# Patient Record
Sex: Male | Born: 1955 | Race: White | Hispanic: No | Marital: Single | Smoking: Former smoker
Health system: Southern US, Community
[De-identification: ages and names within clinical notes are randomized; demographics above are authoritative.]

## PROBLEM LIST (undated history)

## (undated) DIAGNOSIS — F22 Delusional disorders: Secondary | ICD-10-CM

## (undated) DIAGNOSIS — B1920 Unspecified viral hepatitis C without hepatic coma: Secondary | ICD-10-CM

## (undated) DIAGNOSIS — R440 Auditory hallucinations: Secondary | ICD-10-CM

## (undated) DIAGNOSIS — E7800 Pure hypercholesterolemia, unspecified: Secondary | ICD-10-CM

## (undated) DIAGNOSIS — L409 Psoriasis, unspecified: Secondary | ICD-10-CM

## (undated) DIAGNOSIS — I671 Cerebral aneurysm, nonruptured: Secondary | ICD-10-CM

## (undated) DIAGNOSIS — L400 Psoriasis vulgaris: Secondary | ICD-10-CM

## (undated) DIAGNOSIS — J449 Chronic obstructive pulmonary disease, unspecified: Secondary | ICD-10-CM

## (undated) DIAGNOSIS — J439 Emphysema, unspecified: Secondary | ICD-10-CM

## (undated) DIAGNOSIS — J45909 Unspecified asthma, uncomplicated: Secondary | ICD-10-CM

## (undated) DIAGNOSIS — F251 Schizoaffective disorder, depressive type: Secondary | ICD-10-CM

## (undated) DIAGNOSIS — F209 Schizophrenia, unspecified: Secondary | ICD-10-CM

## (undated) DIAGNOSIS — F411 Generalized anxiety disorder: Secondary | ICD-10-CM

## (undated) DIAGNOSIS — F419 Anxiety disorder, unspecified: Secondary | ICD-10-CM

## (undated) DIAGNOSIS — R296 Repeated falls: Secondary | ICD-10-CM

## (undated) DIAGNOSIS — I10 Essential (primary) hypertension: Secondary | ICD-10-CM

## (undated) DIAGNOSIS — F70 Mild intellectual disabilities: Secondary | ICD-10-CM

## (undated) DIAGNOSIS — F1910 Other psychoactive substance abuse, uncomplicated: Secondary | ICD-10-CM

## (undated) DIAGNOSIS — R1312 Dysphagia, oropharyngeal phase: Secondary | ICD-10-CM

## (undated) DIAGNOSIS — I700 Atherosclerosis of aorta: Secondary | ICD-10-CM

## (undated) DIAGNOSIS — K219 Gastro-esophageal reflux disease without esophagitis: Secondary | ICD-10-CM

## (undated) HISTORY — DX: Other psychoactive substance abuse, uncomplicated: F19.10

## (undated) HISTORY — DX: Psoriasis, unspecified: L40.9

## (undated) HISTORY — DX: Schizophrenia, unspecified: F20.9

## (undated) HISTORY — PX: CHEST TUBE INSERTION: SHX231

## (undated) HISTORY — DX: Emphysema, unspecified: J43.9

## (undated) HISTORY — DX: Pure hypercholesterolemia, unspecified: E78.00

## (undated) HISTORY — DX: Unspecified asthma, uncomplicated: J45.909

## (undated) HISTORY — PX: FOOT SURGERY: SHX648

## (undated) HISTORY — DX: Unspecified viral hepatitis C without hepatic coma: B19.20

## (undated) HISTORY — DX: Cerebral aneurysm, nonruptured: I67.1

---

## 2006-04-14 ENCOUNTER — Emergency Department: Payer: Self-pay | Admitting: Emergency Medicine

## 2006-04-15 ENCOUNTER — Other Ambulatory Visit: Payer: Self-pay

## 2007-11-28 HISTORY — PX: ESOPHAGOGASTRODUODENOSCOPY: SHX1529

## 2008-07-28 HISTORY — PX: COLONOSCOPY: SHX174

## 2009-04-27 HISTORY — PX: ESOPHAGOGASTRODUODENOSCOPY: SHX1529

## 2009-09-08 ENCOUNTER — Inpatient Hospital Stay: Payer: Self-pay | Admitting: Psychiatry

## 2009-09-23 ENCOUNTER — Ambulatory Visit: Payer: Self-pay | Admitting: Cardiovascular Disease

## 2009-12-28 ENCOUNTER — Emergency Department: Payer: Self-pay | Admitting: Emergency Medicine

## 2010-01-25 ENCOUNTER — Inpatient Hospital Stay: Payer: Self-pay | Admitting: Psychiatry

## 2010-03-31 ENCOUNTER — Emergency Department: Payer: Self-pay | Admitting: Emergency Medicine

## 2010-06-16 ENCOUNTER — Emergency Department: Payer: Self-pay | Admitting: Emergency Medicine

## 2010-09-09 ENCOUNTER — Emergency Department (HOSPITAL_COMMUNITY)
Admission: EM | Admit: 2010-09-09 | Discharge: 2010-09-09 | Payer: Self-pay | Source: Home / Self Care | Admitting: Emergency Medicine

## 2010-09-27 HISTORY — PX: COLONOSCOPY: SHX174

## 2010-10-24 ENCOUNTER — Emergency Department (HOSPITAL_COMMUNITY)
Admission: EM | Admit: 2010-10-24 | Discharge: 2010-10-25 | Payer: Self-pay | Source: Home / Self Care | Admitting: Emergency Medicine

## 2011-02-07 LAB — BASIC METABOLIC PANEL
CO2: 29 mEq/L (ref 19–32)
Calcium: 8.9 mg/dL (ref 8.4–10.5)
Chloride: 95 mEq/L — ABNORMAL LOW (ref 96–112)
Creatinine, Ser: 0.81 mg/dL (ref 0.4–1.5)
GFR calc Af Amer: >60 mL/min (ref >60)
Glucose, Bld: 83 mg/dL (ref 70–99)

## 2011-02-07 LAB — URINALYSIS, ROUTINE W REFLEX MICROSCOPIC
Bilirubin Urine: NEGATIVE
Glucose, UA: NEGATIVE mg/dL
Hgb urine dipstick: NEGATIVE
Ketones, ur: NEGATIVE mg/dL
Protein, ur: NEGATIVE mg/dL
Urobilinogen, UA: 0.2 mg/dL (ref 0.0–1.0)

## 2011-02-07 LAB — CBC
MCH: 34.8 pg — ABNORMAL HIGH (ref 26.0–34.0)
MCHC: 36.5 g/dL — ABNORMAL HIGH (ref 30.0–36.0)
MCV: 95.3 fL (ref 78.0–100.0)
Platelets: 112 10*3/uL — ABNORMAL LOW (ref 150–400)
RBC: 4.08 MIL/uL — ABNORMAL LOW (ref 4.22–5.81)

## 2011-02-07 LAB — POCT CARDIAC MARKERS
Myoglobin, poc: 43.7 ng/mL (ref 12–200)
Troponin i, poc: <0.05 ng/mL (ref 0.00–0.09)

## 2011-02-07 LAB — DIFFERENTIAL
Basophils Relative: 0 % (ref 0–1)
Eosinophils Absolute: 0.1 10*3/uL (ref 0.0–0.7)
Eosinophils Relative: 1 % (ref 0–5)
Lymphs Abs: 2.4 10*3/uL (ref 0.7–4.0)
Monocytes Relative: 11 % (ref 3–12)
Neutrophils Relative %: 52 % (ref 43–77)

## 2011-02-09 LAB — DIFFERENTIAL
Eosinophils Absolute: 0.2 10*3/uL (ref 0.0–0.7)
Lymphs Abs: 2.2 10*3/uL (ref 0.7–4.0)
Monocytes Absolute: 0.8 10*3/uL (ref 0.1–1.0)
Monocytes Relative: 12 % (ref 3–12)
Neutrophils Relative %: 50 % (ref 43–77)

## 2011-02-09 LAB — COMPREHENSIVE METABOLIC PANEL
ALT: 29 U/L (ref 0–53)
AST: 30 U/L (ref 0–37)
Albumin: 3.9 g/dL (ref 3.5–5.2)
Calcium: 9.3 mg/dL (ref 8.4–10.5)
GFR calc Af Amer: >60 mL/min (ref >60)
Glucose, Bld: 71 mg/dL (ref 70–99)
Sodium: 139 mEq/L (ref 135–145)
Total Protein: 7 g/dL (ref 6.0–8.3)

## 2011-02-09 LAB — URINALYSIS, ROUTINE W REFLEX MICROSCOPIC
Ketones, ur: NEGATIVE mg/dL
Nitrite: NEGATIVE
Protein, ur: NEGATIVE mg/dL
Urobilinogen, UA: 0.2 mg/dL (ref 0.0–1.0)

## 2011-02-09 LAB — CBC
MCHC: 35 g/dL (ref 30.0–36.0)
Platelets: 140 10*3/uL — ABNORMAL LOW (ref 150–400)
RDW: 14 % (ref 11.5–15.5)
WBC: 6.4 10*3/uL (ref 4.0–10.5)

## 2011-12-12 LAB — DRUG SCREEN, URINE
Amphetamines, Ur Screen: NEGATIVE (ref ?–1000)
Benzodiazepine, Ur Scrn: NEGATIVE (ref ?–200)
Cocaine Metabolite,Ur ~~LOC~~: NEGATIVE (ref ?–300)
MDMA (Ecstasy)Ur Screen: POSITIVE (ref ?–500)
Methadone, Ur Screen: NEGATIVE (ref ?–300)
Opiate, Ur Screen: NEGATIVE (ref ?–300)
Phencyclidine (PCP) Ur S: NEGATIVE (ref ?–25)

## 2011-12-12 LAB — CBC
HCT: 43.6 % (ref 40.0–52.0)
HGB: 14.9 g/dL (ref 13.0–18.0)
MCHC: 34.1 g/dL (ref 32.0–36.0)
RBC: 4.34 10*6/uL — ABNORMAL LOW (ref 4.40–5.90)
RDW: 13.6 % (ref 11.5–14.5)
WBC: 7.1 10*3/uL (ref 3.8–10.6)

## 2011-12-12 LAB — ETHANOL: Ethanol: <3 mg/dL

## 2011-12-12 LAB — COMPREHENSIVE METABOLIC PANEL
Alkaline Phosphatase: 70 U/L (ref 50–136)
Anion Gap: 8 (ref 7–16)
Calcium, Total: 8.9 mg/dL (ref 8.5–10.1)
Co2: 28 mmol/L (ref 21–32)
EGFR (Non-African Amer.): >60
Osmolality: 271 (ref 275–301)
Potassium: 3.7 mmol/L (ref 3.5–5.1)
SGPT (ALT): 33 U/L
Sodium: 136 mmol/L (ref 136–145)

## 2011-12-12 LAB — ACETAMINOPHEN LEVEL: Acetaminophen: <2 ug/mL

## 2011-12-12 LAB — SALICYLATE LEVEL: Salicylates, Serum: 8.1 mg/dL — ABNORMAL HIGH

## 2011-12-13 ENCOUNTER — Inpatient Hospital Stay: Payer: Self-pay | Admitting: Psychiatry

## 2011-12-13 LAB — TSH: Thyroid Stimulating Horm: 4.01 u[IU]/mL

## 2013-03-04 ENCOUNTER — Ambulatory Visit: Payer: Self-pay | Admitting: Gastroenterology

## 2013-03-12 ENCOUNTER — Encounter: Payer: Self-pay | Admitting: Gastroenterology

## 2013-03-12 ENCOUNTER — Ambulatory Visit (INDEPENDENT_AMBULATORY_CARE_PROVIDER_SITE_OTHER): Payer: Medicaid Other | Admitting: Gastroenterology

## 2013-03-12 VITALS — BP 108/72 | HR 84 | Temp 97.9°F | Ht 71.0 in | Wt 172.4 lb

## 2013-03-12 DIAGNOSIS — R768 Other specified abnormal immunological findings in serum: Secondary | ICD-10-CM | POA: Insufficient documentation

## 2013-03-12 DIAGNOSIS — Z1211 Encounter for screening for malignant neoplasm of colon: Secondary | ICD-10-CM | POA: Insufficient documentation

## 2013-03-12 DIAGNOSIS — R894 Abnormal immunological findings in specimens from other organs, systems and tissues: Secondary | ICD-10-CM

## 2013-03-12 LAB — CBC WITH DIFFERENTIAL/PLATELET
Basophils Absolute: 0 10*3/uL (ref 0.0–0.1)
Eosinophils Relative: 1 % (ref 0–5)
HCT: 42.1 % (ref 39.0–52.0)
Lymphocytes Relative: 16 % (ref 12–46)
Lymphs Abs: 1.4 10*3/uL (ref 0.7–4.0)
MCH: 32.7 pg (ref 26.0–34.0)
MCV: 93.8 fL (ref 78.0–100.0)
Monocytes Absolute: 1 10*3/uL (ref 0.1–1.0)
RDW: 14.4 % (ref 11.5–15.5)
WBC: 8.8 10*3/uL (ref 4.0–10.5)

## 2013-03-12 NOTE — Progress Notes (Addendum)
Referring Provider: Margorie John, MD Primary Care Physician:  Ian John, MD Primary Gastroenterologist:  Dr. Darrick Penna   Chief Complaint  Patient presents with  . Hepatitis  . Colonoscopy  . EGD    HPI:   Ian Medina is a 57 year old male presenting today at the request of Ian Medina, [place], due to Hepatitis C and need for screening colonoscopy. Resident of [facility]. He doesn't believe he was ever treated for this. Last colonoscopy in remote past, unclear history. Possibly by Dr. Allena Katz in [place], [place].  Denies abdominal pain. Caregiver states he sometimes doesn't want to eat. They tell him he can't smoke if he doesn't eat, then he will eat. Loves spaghetti. Will ask for seconds. No N/V. No GERD or dysphagia. No rectal bleeding, no melena. No constipation. Had a few loose stools about a week ago but resolved.    Unclear prior work-up for Hep C. +IV drug abuse many years ago. Denies any prior blood transfusions or multiple sexual partners.   Past Medical History  Diagnosis Date  . Hepatitis C   . Asthma   . Hypercholesterolemia   . Schizophrenia     diagnosed at age 87, treated by ACT team    Past Surgical History  Procedure Laterality Date  . Foot surgery      right  . Chest tube insertion    . Colonoscopy  Nov 2011    Dr. Samuella Cota: normal, internal hemorrhoids  . Esophagogastroduodenoscopy  June 2010    Dr. Samuella Cota: normal  . Colonoscopy  Sept 2009    small internal hemorrhoids, normal   . Esophagogastroduodenoscopy  Jan 2009    Dr. Allena Katz: linear esophagitis, mild stricture, diffuse gastritis and mild duodenitis, PATH: chronic gastritis, negative H.pylori     Current Outpatient Prescriptions  Medication Sig Dispense Refill  . benztropine (COGENTIN) 2 MG tablet Take 2 mg by mouth 2 (two) times daily.      . budesonide (PULMICORT) 180 MCG/ACT inhaler Inhale 2 puffs into the lungs 2 (two) times daily.      . clonazePAM (KLONOPIN) 0.5 MG tablet Take 0.5  mg by mouth 3 (three) times daily as needed for anxiety.      Marland Kitchen FLUoxetine (PROZAC) 20 MG tablet Take 40 mg by mouth daily.      . haloperidol (HALDOL) 10 MG tablet Take 10 mg by mouth 2 (two) times daily. 1 in the am 2 tablets at pm      . montelukast (SINGULAIR) 10 MG tablet Take 10 mg by mouth at bedtime.      . niacin (NIASPAN) 1000 MG CR tablet Take 1,000 mg by mouth at bedtime.      Marland Kitchen OLANZapine (ZYPREXA) 10 MG tablet Take 30 mg by mouth at bedtime.      . pantoprazole (PROTONIX) 40 MG tablet Take 40 mg by mouth daily.      . sucralfate (CARAFATE) 1 G tablet Take 1 g by mouth 4 (four) times daily.      . traZODone (DESYREL) 50 MG tablet Take 50 mg by mouth at bedtime.       No current facility-administered medications for this visit.    Allergies as of 03/12/2013  . (No Known Allergies)    Family History  Problem Relation Age of Onset  . Colon cancer Neg Hx     History   Social History  . Marital Status: Single    Spouse Name: N/A    Number of Children: N/A  .  Years of Education: N/A   Occupational History  . Not on file.   Social History Main Topics  . Smoking status: Current Every Day Smoker -- 1.00 packs/day    Types: Cigarettes  . Smokeless tobacco: Not on file  . Alcohol Use: No  . Drug Use: No     Comment: IV drug use in remote past  . Sexually Active: Not on file   Other Topics Concern  . Not on file   Social History Narrative  . No narrative on file    Review of Systems: Gen: SEE HPI CV: Denies chest pain, heart palpitations, syncope, peripheral edema. Resp: +cough in am GI: SEE HPI GU : Denies urinary burning, urinary frequency, urinary incontinence.  MS: Denies joint pain, muscle weakness, cramps, limited movement Derm: Denies rash, itching, dry skin Psych: +depression/anxiety Heme: Denies bruising, bleeding, and enlarged lymph nodes.  Physical Exam: BP 108/72  Pulse 84  Temp(Src) 97.9 F (36.6 C) (Oral)  Ht 5\' 11"  (1.803 m)  Wt 172  lb 6.4 oz (78.2 kg)  BMI 24.06 kg/m2 General:   Alert and oriented. Appears older than stated age.  Head:  Normocephalic and atraumatic. Eyes:  Conjunctiva pink, sclera clear, no icterus.    Ears:  Normal auditory acuity. Nose:  No deformity, discharge,  or lesions. Mouth:  No deformity or lesions, edentulous  Neck:  Supple, without mass or thyromegaly. Lungs:  Clear to auscultation bilaterally, without wheezing, rales, or rhonchi.  Heart:  S1, S2 present without murmurs noted.  Abdomen:  +BS, soft, non-tender and non-distended. Without mass or HSM. No rebound or guarding. No hernias noted. Rectal:  Deferred  Msk:  Symmetrical without gross deformities. Normal posture. Extremities:  Without clubbing or edema. Neurologic:  Alert and  oriented x4;  grossly normal neurologically. Skin:  Intact, warm and dry without significant lesions or rashes Cervical Nodes:  No significant cervical adenopathy. Psych:  Alert and cooperative. Normal mood and affect.

## 2013-03-12 NOTE — Progress Notes (Signed)
Cc PCP 

## 2013-03-12 NOTE — Assessment & Plan Note (Signed)
57 year old male with reported history of Hep C, unclear if treatment undertaken in past. Risk factors include remote history of IV drug abuse. No GI complaints, and I do not have any recent blood work that has been drawn. Will check RNA status, genotype if positive RNA, Hep A and B immunity, CBC with diff. May need [place] of abdomen. Attempt to obtain CMP and HFP done at PCP's office. Vaccinations if appropriate after review of blood work. Refer to Hep C clinic if + RNA.

## 2013-03-12 NOTE — Assessment & Plan Note (Signed)
Unclear last colonoscopy. Will attempt to request records. Denies any rectal bleeding, bowel changes. PCP notes state Dr. Allena Katz may have seen him in the past. Discussed risks and benefits with patient regarding a possible colonoscopy. He is willing to proceed if necessary. WILL NEED PROPOFOL DUE TO POLYPHARMACY.

## 2013-03-12 NOTE — Patient Instructions (Addendum)
Please have blood work done today. We will call you with further tests needed.  I need to get the last colonoscopy reports before we decide if you need another one.  We will be in touch shortly regarding this.

## 2013-03-13 LAB — HEPATITIS B CORE ANTIBODY, TOTAL: Hep B Core Total Ab: NEGATIVE

## 2013-03-13 LAB — HEPATITIS A ANTIBODY, TOTAL: Hep A Total Ab: POSITIVE — AB

## 2013-03-19 ENCOUNTER — Other Ambulatory Visit: Payer: Self-pay

## 2013-03-19 ENCOUNTER — Other Ambulatory Visit: Payer: Self-pay | Admitting: Gastroenterology

## 2013-03-19 ENCOUNTER — Encounter: Payer: Self-pay | Admitting: Gastroenterology

## 2013-03-19 DIAGNOSIS — B1920 Unspecified viral hepatitis C without hepatic coma: Secondary | ICD-10-CM

## 2013-03-19 NOTE — Progress Notes (Signed)
I received op notes from prior GI evaluation. Last colonoscopy in 2011 by Dr. Samuella Cota, which was normal. Apparently, patient has a history of polyps, but I do not have actual documentation of this. Will need TCS in 2016 for surveillance.  Last EGD in June 2010 by Dr. Samuella Cota: normal.   No need for colonoscopy at this point. Need CMP and HFP. We have requested this from PCP, but the only HFP I got was from 2009.

## 2013-03-19 NOTE — Progress Notes (Signed)
Abdominal U/S scheduled for Tuesday April 29th at 9:00 am and his caregiver is aware

## 2013-03-19 NOTE — Progress Notes (Signed)
Quick Note:  Routing to [place], this is an [facility] pt. ______

## 2013-03-19 NOTE — Addendum Note (Signed)
Addended by: Lavena Bullion on: 03/19/2013 01:38 PM   Modules accepted: Orders

## 2013-03-19 NOTE — Progress Notes (Signed)
Mailed the info and labs. Routing to Soledad Gerlach to schedule the [place] of abdomen.

## 2013-03-19 NOTE — Progress Notes (Signed)
Called and spoke to [facility]. Caregiver at the facility. ( she said the administrator is Toney Reil, who is unavailable. ) She said she usually takes messages for the pt's.   I went over all of the info. She said ok to mail the lab order so they can do with PCP.   I told her I will mail a copy of all of the info, so she will have for his records.   OK to call and schedule the [place] of abdomen.

## 2013-03-19 NOTE — Progress Notes (Signed)
Quick Note:  I received op notes from prior GI evaluation. Last colonoscopy in 2011 by Dr. Samuella Cota, which was normal. Apparently, patient has a history of polyps, but I do not have actual documentation of this. Will need TCS in 2016 for surveillance.  Last EGD in June 2010 by Dr. Samuella Cota: normal.   No need for colonoscopy at this point. Still need HFP. If we can just add that on to the blood in lab, that would be great.  IF NOT, needs HFP and Hep C genotype. + Hep C.  Needs Hep B vaccination, immune to Hep A.  Needs [place] of abdomen.  Referral to Hep C clinic after HFP and genotype obtained. ______

## 2013-03-25 ENCOUNTER — Ambulatory Visit (HOSPITAL_COMMUNITY): Payer: Medicaid Other | Attending: Gastroenterology

## 2013-04-14 ENCOUNTER — Ambulatory Visit (HOSPITAL_COMMUNITY)
Admission: RE | Admit: 2013-04-14 | Discharge: 2013-04-14 | Disposition: A | Discharge status: Home / Self Care | Payer: Medicare Other | Source: Ambulatory Visit | Attending: Gastroenterology | Admitting: Gastroenterology

## 2013-04-14 DIAGNOSIS — B1920 Unspecified viral hepatitis C without hepatic coma: Secondary | ICD-10-CM | POA: Insufficient documentation

## 2013-04-14 LAB — HEPATIC FUNCTION PANEL
Bilirubin, Direct: 0.1 mg/dL (ref 0.0–0.3)
Indirect Bilirubin: 0.4 mg/dL (ref 0.0–0.9)

## 2013-04-23 NOTE — Progress Notes (Signed)
Quick Note:  K[place] of abdomen reviewed: liver looks good Slight "fullness" of right and left renal pelvis. This is unclear significance. Please have this [place] of abdomen sent to PCP along with this addendum for further evaluation as appropriate. ______

## 2013-04-23 NOTE — Progress Notes (Signed)
Quick Note:  LFTs look great.  He is genotype 1b.  [place]orea of abdomen is normal without evidence of cirrhosis. However, there is some mention of "fullness" of renal area; I do not have recent BMP on file. I will make a note under Korea, and have this sent to his PCP for further evaluation.    Needs: Referral to Hep C clinic for treatment, Genotype 1b. Needs Hep B vaccination only. Return to see [place] in 6 months.  No colonoscopy until 2016 ______

## 2013-04-24 NOTE — Progress Notes (Signed)
Quick Note:  Caregiver aware. See note under labs. ______

## 2013-04-24 NOTE — Progress Notes (Signed)
Cc PCP 

## 2013-04-24 NOTE — Progress Notes (Signed)
Quick Note:  Called and informed [employer], caregiver. Mailing her the list of info. She said he has already begun his Hep B series. She is aware that PCP will get the [place] to follow up on. Routing to Soledad Gerlach and Darl Pikes to send to PCP and do next appt and nic colonoscopy for 2016. ______

## 2013-06-11 NOTE — Progress Notes (Signed)
REVIEWED.  OPV IN ONE YEAR

## 2014-05-01 ENCOUNTER — Other Ambulatory Visit (HOSPITAL_COMMUNITY): Payer: Self-pay | Admitting: *Deleted

## 2014-05-01 DIAGNOSIS — R1310 Dysphagia, unspecified: Secondary | ICD-10-CM

## 2014-05-11 ENCOUNTER — Ambulatory Visit (HOSPITAL_COMMUNITY)
Admission: RE | Admit: 2014-05-11 | Discharge: 2014-05-11 | Disposition: A | Discharge status: Home / Self Care | Payer: Medicare Other | Source: Ambulatory Visit | Attending: Family Medicine | Admitting: Family Medicine

## 2014-05-11 ENCOUNTER — Ambulatory Visit (HOSPITAL_COMMUNITY)
Admission: RE | Admit: 2014-05-11 | Discharge: 2014-05-11 | Disposition: A | Discharge status: Home / Self Care | Payer: Medicare Other | Source: Ambulatory Visit | Attending: Family | Admitting: Family

## 2014-05-11 DIAGNOSIS — E7800 Pure hypercholesterolemia, unspecified: Secondary | ICD-10-CM | POA: Diagnosis not present

## 2014-05-11 DIAGNOSIS — Z8619 Personal history of other infectious and parasitic diseases: Secondary | ICD-10-CM | POA: Diagnosis not present

## 2014-05-11 DIAGNOSIS — F209 Schizophrenia, unspecified: Secondary | ICD-10-CM | POA: Insufficient documentation

## 2014-05-11 DIAGNOSIS — J45909 Unspecified asthma, uncomplicated: Secondary | ICD-10-CM | POA: Diagnosis not present

## 2014-05-11 DIAGNOSIS — R1310 Dysphagia, unspecified: Secondary | ICD-10-CM | POA: Diagnosis present

## 2014-05-11 DIAGNOSIS — IMO0001 Reserved for inherently not codable concepts without codable children: Secondary | ICD-10-CM | POA: Diagnosis not present

## 2014-05-19 NOTE — Procedures (Addendum)
Objective Swallowing Evaluation: Modified Barium Swallowing Study  Patient Details  Name: Ian Medina MRN: [id number] Date of Birth: 02/26/1956  Today's Date: 05/11/2014 Time: 11:35 AM  - 12:20 PM  45 minutes  Past Medical History:  Past Medical History  Diagnosis Date  . Hepatitis C   . Asthma   . Hypercholesterolemia   . Schizophrenia     diagnosed at age 58, treated by ACT team   Past Surgical History:  Past Surgical History  Procedure Laterality Date  . Foot surgery      right  . Chest tube insertion    . Colonoscopy  Nov 2011    Dr. Samuella CotaPandya: normal, internal hemorrhoids  . Esophagogastroduodenoscopy  June 2010    Dr. Samuella CotaPandya: normal  . Colonoscopy  Sept 2009    small internal hemorrhoids, normal   . Esophagogastroduodenoscopy  Jan 2009    Dr. Allena KatzPatel: linear esophagitis, mild stricture, diffuse gastritis and mild duodenitis, PATH: chronic gastritis, negative H.pylori    HPI:  Ian Medina is a 58 year old male resident [facility] who was referred for MBSS following a discharge from [facility] on puree and pudding thick liquids. He is being followed by [facility] for dysphagia intervention with Celedonio MiyamotoJudy Jeffrey. He is currently on a puree diet with honey-thick liquids.      Recommendation/Prognosis  Clinical Impression:   Dysphagia Diagnosis: Moderate oral phase dysphagia;Moderate pharyngeal phase dysphagia;Mild cervical esophageal phase dysphagia Clinical impression: Pt presents with mi/mod oropharyngeal phase dysphagia and mild cervical esophageal phase. Oral phase is characterized by reduced lingual movement, with reduced anterior posterior transit/bolus propulsion and cohesion of bolus resulting in piecemeal deglutition and lingual residue. Bolus spills over base of tongue prematurely without lingual movement. Pharyngeal phase is characterized by delay in swallow initiation (triggered after filling the valleculae and spilling to  pyriforms with thins), reduces laryngeal elevation, reduced tongue base retraction, reduced epiglottic deflection and airway closure resulting in penetration and aspiration of thins before and during the swallow which was not always cleared with spontaneous cough. Pt had mild to moderate pharyngeal residue which he occasionally cleared with spontaneous secondary swallow. He responded well to implement effortful swallow and repeat swallow to clear. Reduced UES relaxation noted with incomplete clearance of tail of bolus. Recommend D3/mech soft and nectar-thick liquids with implementation of effortful swallow, small bites/sips, and swallow 2x for each bite/sip. Results of MBSS discussed with pt and caregiver (Tiffany). Elmarie Shileyiffany is worried that pt will not be compliant with nectar-thick liquids because he will want to have what the other residents have. She reports that his lungs have been clear and that he's been sneaking thin liquids. She understands that I cannot tell her what is a safe amount to aspirate. I encouraged Tiffany to speak with Ian Medina and his doctor to decide whether they wish to pursue thin liquids with known risk for hydration. Strongly advised them to use nectar-thick liquids for 4-6 weeks and repeat MBSS at that time.    Swallow Evaluation Recommendations:  Diet Recommendations: Dysphagia 3 (Mechanical Soft);Nectar-thick liquid Liquid Administration via: Cup Medication Administration: Crushed with puree Supervision: Patient able to self feed;Intermittent supervision to cue for compensatory strategies Compensations: Slow rate;Small sips/bites;Multiple dry swallows after each bite/sip;Effortful swallow Postural Changes and/or Swallow Maneuvers: Out of bed for meals;Seated upright 90 degrees;Upright 30-60 min after meal Oral Care Recommendations: Oral care BID Other Recommendations: Clarify dietary restrictions Follow up Recommendations: Home health SLP    Prognosis:  Fair    Individuals Consulted: Consulted and Agree with Results and Recommendations: Patient;Family member/caregiver Report Sent to : Primary SLP;Referring physician;Facility (Comment)    General: Date of Onset: 04/30/14 Type of Study: Modified Barium Swallowing Study Reason for Referral: Objectively evaluate swallowing function Previous Swallow Assessment: None on record Diet Prior to this Study: Dysphagia 1 (puree);Honey-thick liquids Temperature Spikes Noted: No Respiratory Status: Room air History of Recent Intubation: No Behavior/Cognition: Alert;Cooperative;Pleasant mood Oral Cavity - Dentition: Edentulous Oral Motor / Sensory Function: Within functional limits Self-Feeding Abilities: Able to feed self Patient Positioning: Upright in chair Baseline Vocal Quality: Clear Volitional Cough: Weak Volitional Swallow: Able to elicit Anatomy: Within functional limits Pharyngeal Secretions: Not observed secondary MBS   Reason for Referral:   Objectively evaluate swallowing function    Oral Phase: Oral Preparation/Oral Phase Oral Phase: Impaired Oral - Nectar Oral - Nectar Cup: Weak lingual manipulation;Lingual pumping;Incomplete tongue to palate contact;Reduced posterior propulsion;Lingual/palatal residue;Piecemeal swallowing Oral - Thin Oral - Thin Cup: Weak lingual manipulation;Lingual pumping;Incomplete tongue to palate contact;Reduced posterior propulsion;Lingual/palatal residue;Piecemeal swallowing;Left anterior bolus loss Oral - Solids Oral - Puree: Weak lingual manipulation;Lingual pumping;Incomplete tongue to palate contact;Reduced posterior propulsion;Piecemeal swallowing;Lingual/palatal residue Oral - Mechanical Soft: Weak lingual manipulation;Delayed oral transit;Lingual pumping;Incomplete tongue to palate contact;Reduced posterior propulsion;Piecemeal swallowing;Lingual/palatal residue   Pharyngeal Phase:  Pharyngeal Phase Pharyngeal Phase: Impaired Pharyngeal -  Nectar Pharyngeal - Nectar Cup: Delayed swallow initiation;Premature spillage to pyriform sinuses;Reduced epiglottic inversion;Reduced tongue base retraction;Pharyngeal residue - valleculae;Reduced laryngeal elevation;Lateral channel residue;Pharyngeal residue - cp segment;Reduced pharyngeal peristalsis Pharyngeal - Thin Pharyngeal - Thin Cup: Premature spillage to pyriform sinuses;Reduced laryngeal elevation;Pharyngeal residue - cp segment;Pharyngeal residue - valleculae;Lateral channel residue;Delayed swallow initiation;Reduced epiglottic inversion;Reduced tongue base retraction;Reduced airway/laryngeal closure;Reduced pharyngeal peristalsis;Penetration/Aspiration before swallow;Trace aspiration;Penetration/Aspiration during swallow;Inter-arytenoid space residue Penetration/Aspiration details (thin cup): Material enters airway, remains ABOVE vocal cords then ejected out;Material enters airway, passes BELOW cords without attempt by patient to eject out (silent aspiration);Material enters airway, passes BELOW cords and not ejected out despite cough attempt by patient;Material enters airway, CONTACTS cords then ejected out Pharyngeal - Solids Pharyngeal - Puree: Delayed swallow initiation;Premature spillage to valleculae;Reduced pharyngeal peristalsis;Reduced epiglottic inversion;Reduced laryngeal elevation;Reduced tongue base retraction;Pharyngeal residue - valleculae;Pharyngeal residue - cp segment Pharyngeal - Mechanical Soft: Delayed swallow initiation;Reduced epiglottic inversion;Reduced tongue base retraction;Premature spillage to valleculae;Reduced pharyngeal peristalsis;Reduced laryngeal elevation;Pharyngeal residue - cp segment;Pharyngeal residue - valleculae   Cervical Esophageal Phase  Cervical Esophageal Phase Cervical Esophageal Phase: Impaired Cervical Esophageal Phase - Solids Puree: Reduced cricopharyngeal relaxation Cervical Esophageal Phase - Comment Cervical Esophageal Comment:  incomplete clearance of bolus through UES with some backflow into pyriforms        G-Codes: Swallowing/ MBSS: Current: CK Goal: CI Discharge: CK   Thank you,  Havery MorosDabney Porter, CCC-SLP [phone]   PORTER,DABNEY 05/11/2014, 2:41 PM

## 2014-05-20 ENCOUNTER — Other Ambulatory Visit (HOSPITAL_COMMUNITY): Payer: Self-pay | Admitting: Psychiatry

## 2014-05-20 DIAGNOSIS — R4182 Altered mental status, unspecified: Secondary | ICD-10-CM

## 2014-05-25 ENCOUNTER — Ambulatory Visit (HOSPITAL_COMMUNITY): Payer: PRIVATE HEALTH INSURANCE

## 2014-06-03 ENCOUNTER — Ambulatory Visit (HOSPITAL_COMMUNITY)
Admission: RE | Admit: 2014-06-03 | Discharge: 2014-06-03 | Disposition: A | Discharge status: Home / Self Care | Payer: PRIVATE HEALTH INSURANCE | Source: Ambulatory Visit | Attending: Psychiatry | Admitting: Psychiatry

## 2014-06-03 DIAGNOSIS — I671 Cerebral aneurysm, nonruptured: Secondary | ICD-10-CM | POA: Diagnosis not present

## 2014-06-03 DIAGNOSIS — R4182 Altered mental status, unspecified: Secondary | ICD-10-CM | POA: Insufficient documentation

## 2014-06-03 DIAGNOSIS — I6789 Other cerebrovascular disease: Secondary | ICD-10-CM | POA: Insufficient documentation

## 2014-06-03 DIAGNOSIS — J329 Chronic sinusitis, unspecified: Secondary | ICD-10-CM | POA: Insufficient documentation

## 2014-06-08 DIAGNOSIS — R4189 Other symptoms and signs involving cognitive functions and awareness: Secondary | ICD-10-CM | POA: Diagnosis present

## 2014-07-28 NOTE — Addendum Note (Signed)
Encounter addended by: Dorene Ar, CCC-SLP on: 07/28/2014  4:33 PM<BR>     Documentation filed: Clinical Notes

## 2015-03-21 NOTE — Discharge Summary (Signed)
PATIENT NAME:  Ian Medina, Ian Medina MR#:  [id number] DATE OF BIRTH:  13-Oct-1956  DATE OF ADMISSION:  12/13/2011 DATE OF DISCHARGE:  12/20/2011  HISTORY OF PRESENT ILLNESS: Mr. Ian Medina is a 59 year old male suffering from schizoaffective disorder. There had been an attempt to reduce his medication in late November. He then exacerbated with periods of taking all his clothes off. When he presented to the Emergency Room, he was experiencing severe high-volume and high-frequency auditory hallucinations telling him to kill himself, specifically cut his wrists. He also was experiencing catastrophic anxiety.   He had a delusion that the Nursing Home was restricting his medication deliberately. He also reported that he had been cheated out of an $8000 inheritance. He was not experiencing any orientation or memory difficulty. He was exhibiting his baseline of intellectual challenge.   ANCILLARY CLINICAL DATA: None.   HOSPITAL COURSE: Mr. Ian Medina was admitted to the Inpatient Behavioral Medicine Unit and underwent milieu and group psychotherapy. He was titrated back to the effective dosages of his psychotropic medications gradually.  This involved a titration of his Haldol back to 30 mg daily, paroxetine 40 mg daily, Olanzapine 20 mg at bedtime. He also received trazodone 50 mg at bedtime.   He achieved great improvement in reduction of hallucinations by the 22nd of January. His greatest concern at that time was acute anxiety. He had been on Klonopin in the past, and Klonopin was re-added at 0.5 mg b.i.d.   CONDITION ON DISCHARGE: By the 23rd of January 2013, Mr. Ian Medina was reporting calm disposition with the Klonopin. He was reporting no auditory hallucinations. He was socially interactive and expressing normal interests and hope. His memory and orientation function were intact. He was not exhibiting any adverse medication effects.   MENTAL STATUS EXAM UPON DISCHARGE: Mr. Ian Medina is alert. His eye contact is good.  His concentration is normal. His memory is intact to immediate, recent, and remote. His fund of knowledge and intelligence still reflect a level below average. His speech involves a slightly flat prosody but normal rate and no dysarthria. Thought process is logical, coherent, and goal directed. No looseness of associations or tangents. Thought content: No thoughts of harming himself or others. No delusions or hallucinations. Insight is intact. Judgment is intact. Affect is mildly flat at baseline but with a broad and appropriate range as the interview progresses. Mood is normal.   ASSESSMENT:  AXIS I:  1. Schizophrenia, undifferentiated type, chronic with acute exacerbation, now stable.  2. Anxiety disorder, not otherwise specified, stable.   The MDMA was assessed as a lab error.  AXIS II: Deferred.   AXIS III: History of hepatitis C and gout. Please see the past medical history and previous dictation.   AXIS IV: Primary support group, possible economic.   AXIS V: 57.   Mr. Ian Medina is no longer at risk to harm himself or others. He agrees to call Emergency Services immediately for any thoughts of harming himself, thoughts of harming others, or distress.   DIET: Regular.   ACTIVITY: Routine.   DISCHARGE MEDICATIONS:  1. Benztropine 0.5 mg b.i.d.  2. Trazodone 50 mg daily.  3. Zyprexa 20 mg daily.  4. Paroxetine 40 mg daily (for antianxiety long-term).  5. Haldol 30 mg in a.m.  6. Clonazepam 0.5 mg t.i.d. for anti-acute anxiety.     FOLLOWUP: ACTT on 12/26/2011 at 4:00 p.m. at group home.    ____________________________ Adelene AmasJames S. Ayanah Snader, MD [id number] D: 12/21/2011 22:11:26 ET T: 12/22/2011 12:06:24 ET JOB#: [id number]  cc: Adelene Amas. Johannah Rozas, MD, <Dictator> Lester Ranger MD ELECTRONICALLY SIGNED 12/22/2011 19:29

## 2015-03-21 NOTE — H&P (Signed)
PATIENT NAME:  Ian Medina, Ian Medina MR#:  [id number] DATE OF BIRTH:  [birth year]  DATE OF ADMISSION:  12/13/2011  REASON FOR ADMISSION: Psychosis.   HISTORY OF PRESENT ILLNESS: Mr. Ian Medina resides at a group home. He states that he has been stressed by losing his portion of an inheritance amounting to $8000. He received this news approximately five days ago. He has experienced insomnia, increased anxiety, as well as a return of very loud and frequent auditory hallucinations. They are command self-destructive. They tell him to cut himself with a knife on his wrist; however, Mr. Ian Medina does have enough insight to know that his psychiatric illness has worsened and he needs to get help. Mr. Ian Medina has intact orientation function as well as memory function. He is not experiencing any adverse medication effects. He has been maintained on Zyprexa 20 mg daily, along with Haldol 30 mg daily. Please see below for the rest of his psychotropic medications.   PAST PSYCHIATRIC HISTORY: Mr. Ian Medina has undergone multiple [facility], [facility] [address] [facility] [facility]. He has a history of multiple psychotic exacerbations and a documented diagnosis of schizophrenia. He has never tried to kill himself. He has experienced excessive worry, feeling on edge, as well as muscle tension. He also has experienced periods of severe depressed mood and difficulty concentrating. He has been placed on Paxil currently at 20 mg b.i.d. and trazodone 50 mg at bedtime. He also requires Klonopin 0.5 mg daily. For anti-EPS, he takes Cogentin 1 mg at bedtime.   FAMILY PSYCHIATRIC HISTORY: Father with schizophrenia, sister with bipolar disorder.   SOCIAL HISTORY: Mr. Ian Medina lives in a group home. He does not use alcohol or illegal drugs. Please see the above. It appears that someone became deceased in the family, left an inheritance, and the patient missed out on it. However, this has not  yet been confirmed.   OCCUPATION: Disabled, receiving SSI. He is followed by an ACT.   PAST MEDICAL HISTORY:  1. Asthma.  2. Gout.  3. Hypertension.  4. Hepatitis C.   ALLERGIES: Darvocet.   MEDICATIONS:  1. Cogentin 1 mg at bedtime.  2. Zyprexa 20 mg in p.m.  3. Klonopin 0.5 mg b.i.d.  4. Haldol 30 mg daily.  5. Trazodone 50 mg at bedtime.  6. Paroxetine 40 mg daily.   LABORATORY, DIAGNOSTIC AND RADIOLOGICAL DATA:  Repeat TSH was normal at 4.01. The original was 5.61 yesterday.  Aspirin unremarkable.  CBC unremarkable.  Ethanol negative.  Complete metabolic panel unremarkable.  Tylenol negative.  Urine drug screen is positive for MDMA. This was verified by repeat testing as reported by the Lab. The patient has denied use of substances of abuse.   REVIEW OF SYSTEMS: Constitutional, head, eyes, ears, nose, throat, mouth, neurologic, psychiatric, cardiovascular, respiratory, gastrointestinal, genitourinary, skin, musculoskeletal, hematologic, lymphatic, endocrine, metabolic all are unremarkable.   PHYSICAL EXAMINATION:  VITAL SIGNS: Temperature 98.3, pulse 88, respiratory rate 18, blood pressure 119/83.   GENERAL APPEARANCE: Mr. Ian Medina is a well-developed, well-nourished, middle-aged male sitting in his hospital bed with no abnormal involuntary movements. He has no cachexia. His muscle tone is normal. Grooming is mildly disheveled, hygiene normal.   HEENT: Hearing intact bilaterally to finger rub. Pupils are equally round and reactive to light and accommodation. Oropharynx is clear with no erythema.   NECK: Supple, nontender. No masses.   RESPIRATORY: Normal breath sounds. No wheezing, rhonchi or crackles.  CARDIOVASCULAR: Regular rate and rhythm. No murmurs, rubs or gallops.  ABDOMEN: Bowel sounds are positive, soft, nontender.   GENITOURINARY: Deferred.   LYMPHATICS: No palpable nodes.   EXTREMITIES: No cyanosis, clubbing or edema.   SKIN: Normal turgor. No  rashes.   NEUROLOGICAL: Cranial nerves II through XII are intact. Sensory intact throughout to light touch. Motor strength 5 out of 5 throughout. Deep tendon reflexes normal strength and symmetry throughout. No Babinski. Coordination intact by normal gait.   MENTAL STATUS EXAMINATION: Ian Medina is alert. His eye contact is good. He is oriented to all spheres. His concentration is mildly decreased. His intelligence is below average, and there is a history documented in the previous medical record of him being intellectually challenged. His fund of knowledge is below average. His use of language demonstrates lower than average ability. His speech does involve normal rate and prosody without dysarthria. Thought process is logical, coherent, and goal-directed. There are no looseness of associations. There is occasional thought blocking. There are no tangents. Thought content: Please see the discussion above regarding his hallucinations. He has no delusions. He contracts for safety in the hospital. He has no thoughts of harming others. Affect is anxious, mood is anxious. Insight is intact regarding his psychiatric condition. Judgment is intact for the need of psychiatric treatment.   ASSESSMENT:  AXIS I:  1. Schizophrenia, undifferentiated type, chronic with acute exacerbation.  2. 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 If this is indicative of the patient abusing MDMA, it could have been involved in precipitating his acute psychosis.   AXIS II: Deferred.   AXIS III: History of hepatitis C, gout. Please see the past medical history.   AXIS IV: Primary support group, possibly economic.  AXIS V: 25.   SUICIDE RISK ASSESSMENT: Ian Medina is at risk for suicide outside of the supportive environment of the hospital.   PLAN:  1. Therefore, the undersigned will admit Ian Medina to the Inpatient Behavioral Unit where he will undergo milieu and group psychotherapy. The patient  reported that he has been off of his psychotropic medication for five days. He states that his psychosis exacerbates within two days when coming off of that. Therefore, there are two possible biologic exacerbating factors: Noncompliance and MDMA.  2. I will provide further opportunity for Mr. Burd to discuss any possible drug abuse.  3. I will proceed with his Zyprexa, augmented with Haldol for anti-psychosis. I will restart the Zyprexa at 10 mg at bedtime and anticipate titrating to 20 mg at bedtime. I will restart his Haldol at 10 mg daily and anticipate titrating to his preadmission level.  4. For antianxiety and antidepression, I will restart his paroxetine at 20 mg daily. For anti-insomnia and augmenting paroxetine, I will restart his trazodone at 50 mg at bedtime. I will provide Cogentin 0.5 mg b.i.d. for anti-EPS.   ____________________________ Adelene Amas. Kema Santaella, MD jsw:[id number] D: 12/13/2011 18:41:45 ET T: 12/14/2011 07:12:31 ET JOB#: [id number]  cc: Adelene Amas. Reece Fehnel, MD, <Dictator> [place] MD ELECTRONICALLY SIGNED 12/22/2011 19:27

## 2015-03-25 ENCOUNTER — Encounter: Payer: Self-pay | Admitting: Gastroenterology

## 2015-11-28 DIAGNOSIS — J189 Pneumonia, unspecified organism: Secondary | ICD-10-CM

## 2015-11-28 HISTORY — DX: Pneumonia, unspecified organism: J18.9

## 2016-07-26 ENCOUNTER — Encounter (INDEPENDENT_AMBULATORY_CARE_PROVIDER_SITE_OTHER): Payer: Self-pay | Admitting: Internal Medicine

## 2016-08-09 ENCOUNTER — Ambulatory Visit (INDEPENDENT_AMBULATORY_CARE_PROVIDER_SITE_OTHER): Payer: Medicaid Other | Admitting: Internal Medicine

## 2016-08-23 ENCOUNTER — Ambulatory Visit (INDEPENDENT_AMBULATORY_CARE_PROVIDER_SITE_OTHER): Payer: Medicare Other | Admitting: Internal Medicine

## 2016-08-23 ENCOUNTER — Encounter (INDEPENDENT_AMBULATORY_CARE_PROVIDER_SITE_OTHER): Payer: Self-pay

## 2016-08-23 ENCOUNTER — Encounter (INDEPENDENT_AMBULATORY_CARE_PROVIDER_SITE_OTHER): Payer: Self-pay | Admitting: *Deleted

## 2016-08-23 ENCOUNTER — Encounter (INDEPENDENT_AMBULATORY_CARE_PROVIDER_SITE_OTHER): Payer: Self-pay | Admitting: Internal Medicine

## 2016-08-23 ENCOUNTER — Ambulatory Visit (INDEPENDENT_AMBULATORY_CARE_PROVIDER_SITE_OTHER): Payer: Medicaid Other | Admitting: Internal Medicine

## 2016-08-23 VITALS — BP 110/80 | HR 80 | Temp 97.5°F | Ht 71.0 in | Wt 194.1 lb

## 2016-08-23 DIAGNOSIS — B1920 Unspecified viral hepatitis C without hepatic coma: Secondary | ICD-10-CM | POA: Diagnosis not present

## 2016-08-23 DIAGNOSIS — F1910 Other psychoactive substance abuse, uncomplicated: Secondary | ICD-10-CM | POA: Insufficient documentation

## 2016-08-23 NOTE — Patient Instructions (Signed)
OV in 3 months. 

## 2016-08-23 NOTE — Progress Notes (Signed)
   Subjective:    Patient ID: Ian Medina, male    DOB: 02/24/1956, 60 y.o.   MRN: [id number]  HPI Referred by Herbert DeanerMeredith Harris ([facility]). for Hepatitis C.  He cannot tell me when he was diagnosed with Hepatitis C.  He says he has never been treated for Hepatitis A and B. [facility] y[facility].  04/14/2013: Genotype 1B,  Hx of Schizophrenia.   04/14/2013 Hepatitis A Ab, total positive 04/14/2013; Hepatitis B Core total ab: negative      Any hx of IV drug abuse or drug abuse? Yes. Over 20 yrs ago.  Are you drinking now? No  Any hx of etoh abuse? No  Do you have tattoos? 4  Have you ever received a blood transfusion? no When were you diagnosed with Hepatitis C? unknown Any hx of mental illness requiring treatment? Hx of schizophrenia.  Do you have suicidal thoughts no  Review of Systems Past Medical History:  Diagnosis Date  . Asthma   . Drug abuse   . Hepatitis C   . Hypercholesterolemia   . Schizophrenia (HCC)    diagnosed at age 60, treated by ACT team    Past Surgical History:  Procedure Laterality Date  . CHEST TUBE INSERTION    . COLONOSCOPY  Nov 2011   Dr. Samuella CotaPandya: normal, internal hemorrhoids  . COLONOSCOPY  Sept 2009   small internal hemorrhoids, normal   . ESOPHAGOGASTRODUODENOSCOPY  June 2010   Dr. Samuella CotaPandya: normal  . ESOPHAGOGASTRODUODENOSCOPY  Jan 2009   Dr. Allena KatzPatel: linear esophagitis, mild stricture, diffuse gastritis and mild duodenitis, PATH: chronic gastritis, negative H.pylori   . FOOT SURGERY     right    No Known Allergies  Current Outpatient Prescriptions on File Prior to Visit  Medication Sig Dispense Refill  . montelukast (SINGULAIR) 10 MG tablet Take 10 mg by mouth at bedtime.    . niacin (NIASPAN) 1000 MG CR tablet Take 1,000 mg by mouth at bedtime.    Marland Kitchen. OLANZapine (ZYPREXA) 10 MG tablet Take 20 mg by mouth at bedtime.     . pantoprazole (PROTONIX) 40 MG tablet Take 40 mg by mouth daily.    . sucralfate  (CARAFATE) 1 G tablet Take 1 g by mouth 4 (four) times daily.    . traZODone (DESYREL) 50 MG tablet Take 50 mg by mouth at bedtime.    . haloperidol (HALDOL) 10 MG tablet Take 10 mg by mouth 2 (two) times daily. 1 in the am 2 tablets at pm     No current facility-administered medications on file prior to visit.        Objective:   Physical Exam Blood pressure 110/80, pulse 80, temperature 97.5 F (36.4 C), height 5\' 11"  (1.803 m), weight 194 lb 1.6 oz (88 kg).  Alert and oriented. Skin warm and dry. Oral mucosa is moist.   . Sclera anicteric, conjunctivae is pink. Thyroid not enlarged. No cervical lymphadenopathy. Lungs clear. Heart regular rate and rhythm.  Abdomen is soft. Bowel sounds are positive. No hepatomegaly. No abdominal masses felt. No tenderness.  No edema to lower extremities. .      Assessment & Plan:  Hepatitis C. Am going to  Get routine labs. US elastrography. OV in 3 months.  Rx for Hepatitis A and B

## 2016-08-31 ENCOUNTER — Other Ambulatory Visit (HOSPITAL_COMMUNITY)
Admission: RE | Admit: 2016-08-31 | Discharge: 2016-08-31 | Disposition: A | Discharge status: Home / Self Care | Payer: Medicare Other | Source: Ambulatory Visit | Attending: Internal Medicine | Admitting: Internal Medicine

## 2016-08-31 ENCOUNTER — Ambulatory Visit (HOSPITAL_COMMUNITY)
Admission: RE | Admit: 2016-08-31 | Discharge: 2016-08-31 | Disposition: A | Discharge status: Home / Self Care | Payer: Medicare Other | Source: Ambulatory Visit | Attending: Internal Medicine | Admitting: Internal Medicine

## 2016-08-31 DIAGNOSIS — R932 Abnormal findings on diagnostic imaging of liver and biliary tract: Secondary | ICD-10-CM | POA: Insufficient documentation

## 2016-08-31 DIAGNOSIS — B1920 Unspecified viral hepatitis C without hepatic coma: Secondary | ICD-10-CM | POA: Insufficient documentation

## 2016-08-31 LAB — COMPREHENSIVE METABOLIC PANEL
ALBUMIN: 4.3 g/dL (ref 3.5–5.0)
ALK PHOS: 64 U/L (ref 38–126)
ALT: 13 U/L — ABNORMAL LOW (ref 17–63)
ANION GAP: 9 (ref 5–15)
AST: 24 U/L (ref 15–41)
BUN: 6 mg/dL (ref 6–20)
CALCIUM: 9.4 mg/dL (ref 8.9–10.3)
CO2: 29 mmol/L (ref 22–32)
Chloride: 102 mmol/L (ref 101–111)
Creatinine, Ser: 0.77 mg/dL (ref 0.61–1.24)
GFR calc Af Amer: >60 mL/min (ref >60)
GFR calc non Af Amer: >60 mL/min (ref >60)
GLUCOSE: 112 mg/dL — AB (ref 65–99)
Potassium: 3.6 mmol/L (ref 3.5–5.1)
SODIUM: 140 mmol/L (ref 135–145)
Total Bilirubin: 0.6 mg/dL (ref 0.3–1.2)
Total Protein: 8.1 g/dL (ref 6.5–8.1)

## 2016-08-31 LAB — CBC WITH DIFFERENTIAL/PLATELET
Basophils Absolute: 0 10*3/uL (ref 0.0–0.1)
Basophils Relative: 1 %
Eosinophils Absolute: 0.2 10*3/uL (ref 0.0–0.7)
Eosinophils Relative: 2 %
HEMATOCRIT: 45.2 % (ref 39.0–52.0)
Hemoglobin: 14.8 g/dL (ref 13.0–17.0)
LYMPHS PCT: 30 %
Lymphs Abs: 2.4 10*3/uL (ref 0.7–4.0)
MCH: 30.5 pg (ref 26.0–34.0)
MCHC: 32.7 g/dL (ref 30.0–36.0)
MCV: 93 fL (ref 78.0–100.0)
MONO ABS: 0.9 10*3/uL (ref 0.1–1.0)
MONOS PCT: 11 %
NEUTROS ABS: 4.7 10*3/uL (ref 1.7–7.7)
Neutrophils Relative %: 56 %
Platelets: 194 10*3/uL (ref 150–400)
RBC: 4.86 MIL/uL (ref 4.22–5.81)
RDW: 14 % (ref 11.5–15.5)
WBC: 8.2 10*3/uL (ref 4.0–10.5)

## 2016-08-31 LAB — PROTIME-INR
INR: 0.94
PROTHROMBIN TIME: 12.6 s (ref 11.4–15.2)

## 2016-09-01 LAB — HCV RNA QUANT
HCV Quantitative Log: 4.064 log10 IU/mL (ref >1.70)
HCV Quantitative: 11600 IU/mL (ref >50)

## 2016-09-08 ENCOUNTER — Telehealth (INDEPENDENT_AMBULATORY_CARE_PROVIDER_SITE_OTHER): Payer: Self-pay | Admitting: Internal Medicine

## 2016-09-14 NOTE — Telephone Encounter (Signed)
error 

## 2016-11-22 ENCOUNTER — Ambulatory Visit (INDEPENDENT_AMBULATORY_CARE_PROVIDER_SITE_OTHER): Payer: Medicaid Other | Admitting: Internal Medicine

## 2016-11-22 ENCOUNTER — Encounter (INDEPENDENT_AMBULATORY_CARE_PROVIDER_SITE_OTHER): Payer: Self-pay | Admitting: Internal Medicine

## 2016-11-30 ENCOUNTER — Ambulatory Visit (INDEPENDENT_AMBULATORY_CARE_PROVIDER_SITE_OTHER): Payer: Medicaid Other | Admitting: Internal Medicine

## 2016-12-08 ENCOUNTER — Encounter (INDEPENDENT_AMBULATORY_CARE_PROVIDER_SITE_OTHER): Payer: Self-pay | Admitting: Internal Medicine

## 2016-12-08 ENCOUNTER — Ambulatory Visit (INDEPENDENT_AMBULATORY_CARE_PROVIDER_SITE_OTHER): Payer: Medicare Other | Admitting: Internal Medicine

## 2016-12-08 VITALS — BP 130/80 | HR 84 | Temp 98.0°F | Ht 72.0 in | Wt 194.9 lb

## 2016-12-08 DIAGNOSIS — B1920 Unspecified viral hepatitis C without hepatic coma: Secondary | ICD-10-CM | POA: Diagnosis not present

## 2016-12-08 NOTE — Progress Notes (Signed)
Subjective:    Patient ID: Sani Madariaga, male    DOB: 1956-07-23, 61 y.o.   MRN: [id number]  HPI Here today for f/u of his Hepatitis C. Resident of [facility] for 4-6 yrs. 04/14/2014 genotype 1B Hx of Schizophrenia.  Has started the Hepatitis B series.  He he doing good.  Good spirits. Appetite is good. No weight loss. Has a BM daily. No melena or BRRB. Sometimes he has 2-3 stools a day.   04/14/2013 Hepatitis A Ab, total positive 04/14/2013; Hepatitis B Core total ab: negative  08/31/2016 [place] elastrography ULTRASOUND HEPATIC ELASTOGRAPHY: Median hepatic shear wave velocity is calculated at 1.23 m/sec, which corresponds to a Metavir fibrosis score of F2 + F3.  Moderate risk of fibrosis.  Additional testing is appropriate.    Hepatic Function Latest Ref Rng & Units 08/31/2016 04/14/2013 12/12/2011  Total Protein 6.5 - 8.1 g/dL 8.1 7.1 8.1  Albumin 3.5 - 5.0 g/dL 4.3 4.1 4.0  AST 15 - 41 U/L 24 31 34  ALT 17 - 63 U/L 13(L) 27 33  Alk Phosphatase 38 - 126 U/L 64 66 70  Total Bilirubin 0.3 - 1.2 mg/dL 0.6 0.5 0.3  Bilirubin, Direct 0.0 - 0.3 mg/dL - 0.1 -    08/31/2016 HCV Quaint 11/600, CBC    Component Value Date/Time   WBC 8.2 08/31/2016 1004   RBC 4.86 08/31/2016 1004   HGB 14.8 08/31/2016 1004   HGB 14.9 12/12/2011 1726   HCT 45.2 08/31/2016 1004   HCT 43.6 12/12/2011 1726   PLT 194 08/31/2016 1004   PLT 169 12/12/2011 1726   MCV 93.0 08/31/2016 1004   MCV 100 12/12/2011 1726   MCH 30.5 08/31/2016 1004   MCHC 32.7 08/31/2016 1004   RDW 14.0 08/31/2016 1004   RDW 13.6 12/12/2011 1726   LYMPHSABS 2.4 08/31/2016 1004   MONOABS 0.9 08/31/2016 1004   EOSABS 0.2 08/31/2016 1004   BASOSABS 0.0 08/31/2016 1004     Review of Systems Past Medical History:  Diagnosis Date  . Asthma   . Drug abuse   . Hepatitis C   . Hypercholesterolemia   . Schizophrenia ([place])    diagnosed at age 59, treated by ACT team    Past Surgical History:  Procedure Laterality  Date  . CHEST TUBE INSERTION    . COLONOSCOPY  Nov 2011   Dr. Earley Brooke: normal, internal hemorrhoids  . COLONOSCOPY  Sept 2009   small internal hemorrhoids, normal   . ESOPHAGOGASTRODUODENOSCOPY  June 2010   Dr. Earley Brooke: normal  . ESOPHAGOGASTRODUODENOSCOPY  Jan 2009   Dr. Posey Pronto: linear esophagitis, mild stricture, diffuse gastritis and mild duodenitis, PATH: chronic gastritis, negative H.pylori   . FOOT SURGERY     right    No Known Allergies  Current Outpatient Prescriptions on File Prior to Visit  Medication Sig Dispense Refill  . montelukast (SINGULAIR) 10 MG tablet Take 10 mg by mouth at bedtime.    . niacin (NIASPAN) 1000 MG CR tablet Take 1,000 mg by mouth at bedtime.    Marland Kitchen OLANZapine (ZYPREXA) 10 MG tablet Take 20 mg by mouth at bedtime.     . pantoprazole (PROTONIX) 40 MG tablet Take 40 mg by mouth daily.    . sucralfate (CARAFATE) 1 G tablet Take 1 g by mouth 4 (four) times daily.    . traZODone (DESYREL) 50 MG tablet Take 50 mg by mouth at bedtime.    . vitamin B-12 (CYANOCOBALAMIN) 1000 MCG tablet Take 1,000  mcg by mouth daily.     No current facility-administered medications on file prior to visit.        Objective:   Physical Exam Blood pressure 130/80, pulse 84, temperature 98 F (36.7 C), height 6' (1.829 m), weight 194 lb 14.4 oz (88.4 kg). Alert and oriented. Skin warm and dry. Oral mucosa is moist.   . Sclera anicteric, conjunctivae is pink. Thyroid not enlarged. No cervical lymphadenopathy. Lungs clear. Heart regular rate and rhythm.  Abdomen is soft. Bowel sounds are positive. No hepatomegaly. No abdominal masses felt. No tenderness.  No edema to lower extremities.         Assessment & Plan:  Hepatitis C. Hep C quaint today + Urine drug screen. OV in 3 months.

## 2016-12-08 NOTE — Patient Instructions (Signed)
Ov in 3 months 

## 2016-12-09 LAB — DRUG ABUSE PANEL 10-50 NO CONF, U
AMPHETAMINES (1000 NG/ML SCRN): NEGATIVE
BARBITURATES: NEGATIVE
BENZODIAZEPINES: NEGATIVE
COCAINE METABOLITES: NEGATIVE
MARIJUANA MET (50 NG/ML SCRN): NEGATIVE
METHADONE: NEGATIVE
METHAQUALONE: NEGATIVE
OPIATES: NEGATIVE
PHENCYCLIDINE: NEGATIVE
PROPOXYPHENE: NEGATIVE

## 2016-12-11 ENCOUNTER — Telehealth (INDEPENDENT_AMBULATORY_CARE_PROVIDER_SITE_OTHER): Payer: Self-pay | Admitting: Internal Medicine

## 2016-12-11 LAB — HEPATITIS C RNA QUANTITATIVE
HCV Quantitative Log: 3.48 {Log} — ABNORMAL HIGH (ref <1.18)
HCV Quantitative: 2993 IU/mL — ABNORMAL HIGH (ref <15)

## 2016-12-11 NOTE — Telephone Encounter (Signed)
Ian Medina, will treat with Harvoni x 12 weeks.

## 2016-12-15 NOTE — Telephone Encounter (Signed)
To be completed 12/18/2016.

## 2016-12-18 NOTE — Telephone Encounter (Signed)
Paperwork has been done and will be faxed to [facility] for review and further PA for the medication needed. The patient will be made aware when we hear from [facility].

## 2017-01-02 ENCOUNTER — Telehealth (INDEPENDENT_AMBULATORY_CARE_PROVIDER_SITE_OTHER): Payer: Self-pay | Admitting: Internal Medicine

## 2017-01-02 ENCOUNTER — Other Ambulatory Visit (INDEPENDENT_AMBULATORY_CARE_PROVIDER_SITE_OTHER): Payer: Self-pay | Admitting: *Deleted

## 2017-01-02 DIAGNOSIS — B182 Chronic viral hepatitis C: Secondary | ICD-10-CM

## 2017-01-02 NOTE — Telephone Encounter (Signed)
Labs have been ordered , and a letter will be sent as a reminder.

## 2017-01-02 NOTE — Telephone Encounter (Signed)
Ian Medina, Needs CBC, Hep C quaint, Hepatic function in 8 weeks.

## 2017-01-02 NOTE — Telephone Encounter (Signed)
Talked with [facility]. Protonix to be stopped during treatment and resumed after treatment.

## 2017-01-03 NOTE — Telephone Encounter (Signed)
err

## 2017-02-01 ENCOUNTER — Other Ambulatory Visit (INDEPENDENT_AMBULATORY_CARE_PROVIDER_SITE_OTHER): Payer: Self-pay | Admitting: *Deleted

## 2017-02-01 ENCOUNTER — Telehealth (INDEPENDENT_AMBULATORY_CARE_PROVIDER_SITE_OTHER): Payer: Self-pay | Admitting: *Deleted

## 2017-02-01 DIAGNOSIS — B182 Chronic viral hepatitis C: Secondary | ICD-10-CM

## 2017-02-01 NOTE — Telephone Encounter (Signed)
We rec'd a fax from [facility] that they could not get a hold of the patient after numerous attempts. I called the patient's administrator , Philis KendallVicky Connally and explained to her. She states that the patient has 3 pills left. I also questioned that the patient had not had his lab work drawn. She was unaware.  Talked with [facility] and told them that the patient's medication was to come to our office. As the patient has only 3 pills they are going to ship to his residence this time and the last Rx will be sent to our office. I had the Vicky to call [facility], and she is bringing next week to have his lab work drawn.

## 2017-02-02 LAB — HEPATIC FUNCTION PANEL
ALK PHOS: 72 U/L (ref 40–115)
ALT: 9 U/L (ref 9–46)
AST: 15 U/L (ref 10–35)
Albumin: 4 g/dL (ref 3.6–5.1)
BILIRUBIN DIRECT: 0.1 mg/dL (ref <=0.2)
BILIRUBIN INDIRECT: 0.3 mg/dL (ref 0.2–1.2)
Total Bilirubin: 0.4 mg/dL (ref 0.2–1.2)
Total Protein: 7 g/dL (ref 6.1–8.1)

## 2017-02-02 LAB — CBC
HEMATOCRIT: 43.8 % (ref 38.5–50.0)
HEMOGLOBIN: 14.4 g/dL (ref 13.2–17.1)
MCH: 31.2 pg (ref 27.0–33.0)
MCHC: 32.9 g/dL (ref 32.0–36.0)
MCV: 95 fL (ref 80.0–100.0)
MPV: 9.5 fL (ref 7.5–12.5)
Platelets: 224 10*3/uL (ref 140–400)
RBC: 4.61 MIL/uL (ref 4.20–5.80)
RDW: 14.4 % (ref 11.0–15.0)
WBC: 8.3 10*3/uL (ref 3.8–10.8)

## 2017-02-06 ENCOUNTER — Telehealth (INDEPENDENT_AMBULATORY_CARE_PROVIDER_SITE_OTHER): Payer: Self-pay | Admitting: Internal Medicine

## 2017-02-06 LAB — HEPATITIS C RNA QUANTITATIVE
HCV Quantitative Log: <1.18 Log IU/mL
HCV Quantitative: <15 IU/mL

## 2017-02-06 NOTE — Telephone Encounter (Signed)
Results of lab given to caregiver. Has appt on 03/08/2017. Will give copy of results then

## 2017-02-19 ENCOUNTER — Other Ambulatory Visit (INDEPENDENT_AMBULATORY_CARE_PROVIDER_SITE_OTHER): Payer: Self-pay | Admitting: *Deleted

## 2017-02-19 ENCOUNTER — Encounter (INDEPENDENT_AMBULATORY_CARE_PROVIDER_SITE_OTHER): Payer: Self-pay | Admitting: *Deleted

## 2017-02-19 DIAGNOSIS — B182 Chronic viral hepatitis C: Secondary | ICD-10-CM

## 2017-03-05 LAB — HEPATIC FUNCTION PANEL
ALBUMIN: 4.1 g/dL (ref 3.6–5.1)
ALK PHOS: 69 U/L (ref 40–115)
ALT: 13 U/L (ref 9–46)
AST: 19 U/L (ref 10–35)
BILIRUBIN TOTAL: 0.4 mg/dL (ref 0.2–1.2)
Bilirubin, Direct: 0.1 mg/dL (ref <=0.2)
Indirect Bilirubin: 0.3 mg/dL (ref 0.2–1.2)
Total Protein: 7.1 g/dL (ref 6.1–8.1)

## 2017-03-05 LAB — CBC
HEMATOCRIT: 44.3 % (ref 38.5–50.0)
Hemoglobin: 14.7 g/dL (ref 13.2–17.1)
MCH: 31.3 pg (ref 27.0–33.0)
MCHC: 33.2 g/dL (ref 32.0–36.0)
MCV: 94.3 fL (ref 80.0–100.0)
MPV: 9.7 fL (ref 7.5–12.5)
PLATELETS: 208 10*3/uL (ref 140–400)
RBC: 4.7 MIL/uL (ref 4.20–5.80)
RDW: 14.2 % (ref 11.0–15.0)
WBC: 13.2 10*3/uL — AB (ref 3.8–10.8)

## 2017-03-07 LAB — HEPATITIS C RNA QUANTITATIVE
HCV QUANT LOG: NOT DETECTED {Log_IU}/mL
HCV Quantitative: <15 IU/mL

## 2017-03-08 ENCOUNTER — Encounter (INDEPENDENT_AMBULATORY_CARE_PROVIDER_SITE_OTHER): Payer: Self-pay

## 2017-03-08 ENCOUNTER — Encounter (INDEPENDENT_AMBULATORY_CARE_PROVIDER_SITE_OTHER): Payer: Self-pay | Admitting: Internal Medicine

## 2017-03-08 ENCOUNTER — Ambulatory Visit (INDEPENDENT_AMBULATORY_CARE_PROVIDER_SITE_OTHER): Payer: Medicare Other | Admitting: Internal Medicine

## 2017-03-08 VITALS — BP 106/80 | HR 68 | Temp 97.7°F | Ht 71.0 in | Wt 192.8 lb

## 2017-03-08 DIAGNOSIS — B1920 Unspecified viral hepatitis C without hepatic coma: Secondary | ICD-10-CM | POA: Diagnosis not present

## 2017-03-08 NOTE — Progress Notes (Addendum)
   Subjective:    Patient ID: Ian Medina, male    DOB: Feb 01, 1956, 61 y.o.   MRN: [id number]  HPI  Here today for f/u. Resident of [facility] x 4-6 yrs. 03/05/2017 Hep C quaint undetected. 04/14/2014 Genotype 1A. Treated with Harvoni x 12 weeks.  He is doing good. He has no complaints. Appetite is good.  Has started Hepatitis B series. Nees one more injection for Hepatitis B Has a BM daily. No melena or BRRB   04/14/2013 Hepatitis A Ab, total positive 04/14/2013; Hepatitis B Core total ab: negative   08/31/2016 [place] elastrography ULTRASOUND HEPATIC ELASTOGRAPHY: Median hepatic shear wave velocity is calculated at 1.23 m/sec, which corresponds to a Metavir fibrosis score of F2 + F3.  Moderate risk of fibrosis. Additional testing is appropriate.    Review of Systems Past Medical History:  Diagnosis Date  . Asthma   . Drug abuse   . Hepatitis C   . Hypercholesterolemia   . Schizophrenia (HCC)    diagnosed at age 76, treated by ACT team    Past Surgical History:  Procedure Laterality Date  . CHEST TUBE INSERTION    . COLONOSCOPY  Nov 2011   Dr. Samuella Cota: normal, internal hemorrhoids  . COLONOSCOPY  Sept 2009   small internal hemorrhoids, normal   . ESOPHAGOGASTRODUODENOSCOPY  June 2010   Dr. Samuella Cota: normal  . ESOPHAGOGASTRODUODENOSCOPY  Jan 2009   Dr. Allena Katz: linear esophagitis, mild stricture, diffuse gastritis and mild duodenitis, PATH: chronic gastritis, negative H.pylori   . FOOT SURGERY     right    No Known Allergies  Current Outpatient Prescriptions on File Prior to Visit  Medication Sig Dispense Refill  . FLUoxetine (PROZAC) 20 MG capsule Take 20 mg by mouth daily.    . fluticasone (FLOVENT HFA) 220 MCG/ACT inhaler Inhale into the lungs 2 (two) times daily.    . montelukast (SINGULAIR) 10 MG tablet Take 10 mg by mouth at bedtime.    . niacin (NIASPAN) 1000 MG CR tablet Take 1,000 mg by mouth at bedtime.    Marland Kitchen OLANZapine (ZYPREXA) 10 MG tablet Take 20 mg  by mouth at bedtime.     . pantoprazole (PROTONIX) 40 MG tablet Take 40 mg by mouth daily.    . sucralfate (CARAFATE) 1 G tablet Take 1 g by mouth 4 (four) times daily.    . traZODone (DESYREL) 50 MG tablet Take 50 mg by mouth at bedtime.    . vitamin B-12 (CYANOCOBALAMIN) 1000 MCG tablet Take 1,000 mcg by mouth daily.     No current facility-administered medications on file prior to visit.        Objective:   Physical Exam  Blood pressure 106/80, pulse 68, temperature 97.7 F (36.5 C), height  (1.803 m), weight 192 lb 12.8 oz (87.5 kg).  Alert and oriented. Skin warm and dry. Oral mucosa is moist.   . Sclera anicteric, conjunctivae is pink. Thyroid not enlarged. No cervical lymphadenopathy. Lungs clear. Heart regular rate and rhythm.  Abdomen is soft. Bowel sounds are positive. No hepatomegaly. No abdominal masses felt. No tenderness.  No edema to lower extremities.           Assessment & Plan:  Hepatitis C. He has cleared the virus. Doing well.  OV in 1 year.  Recall [place] in June

## 2017-03-08 NOTE — Patient Instructions (Signed)
OV in 1 year.  

## 2017-03-09 ENCOUNTER — Other Ambulatory Visit (INDEPENDENT_AMBULATORY_CARE_PROVIDER_SITE_OTHER): Payer: Self-pay | Admitting: *Deleted

## 2017-03-09 DIAGNOSIS — B182 Chronic viral hepatitis C: Secondary | ICD-10-CM

## 2017-04-27 ENCOUNTER — Encounter (INDEPENDENT_AMBULATORY_CARE_PROVIDER_SITE_OTHER): Payer: Self-pay | Admitting: *Deleted

## 2017-07-24 ENCOUNTER — Telehealth (INDEPENDENT_AMBULATORY_CARE_PROVIDER_SITE_OTHER): Payer: Self-pay | Admitting: Internal Medicine

## 2017-07-24 DIAGNOSIS — B182 Chronic viral hepatitis C: Secondary | ICD-10-CM

## 2017-07-24 NOTE — Telephone Encounter (Signed)
Ian Medina, Needs US abdomen for Hep C.

## 2017-07-24 NOTE — Telephone Encounter (Signed)
[place] sch'd 08/02/17 at [phone] ([phone]), npo after midnight, patient aware

## 2017-08-01 ENCOUNTER — Other Ambulatory Visit (HOSPITAL_COMMUNITY): Payer: Medicare Other

## 2017-08-02 ENCOUNTER — Ambulatory Visit (HOSPITAL_COMMUNITY): Admission: RE | Admit: 2017-08-02 | Payer: Medicare Other | Source: Ambulatory Visit

## 2017-08-08 ENCOUNTER — Ambulatory Visit (HOSPITAL_COMMUNITY)
Admission: RE | Admit: 2017-08-08 | Discharge: 2017-08-08 | Disposition: A | Discharge status: Home / Self Care | Payer: Medicare Other | Source: Ambulatory Visit | Attending: Internal Medicine | Admitting: Internal Medicine

## 2017-08-08 DIAGNOSIS — K8020 Calculus of gallbladder without cholecystitis without obstruction: Secondary | ICD-10-CM | POA: Insufficient documentation

## 2017-08-08 DIAGNOSIS — B182 Chronic viral hepatitis C: Secondary | ICD-10-CM | POA: Diagnosis not present

## 2018-02-14 ENCOUNTER — Encounter (INDEPENDENT_AMBULATORY_CARE_PROVIDER_SITE_OTHER): Payer: Self-pay | Admitting: *Deleted

## 2018-03-11 ENCOUNTER — Ambulatory Visit (INDEPENDENT_AMBULATORY_CARE_PROVIDER_SITE_OTHER): Payer: Medicare Other | Admitting: Internal Medicine

## 2018-03-25 ENCOUNTER — Ambulatory Visit (INDEPENDENT_AMBULATORY_CARE_PROVIDER_SITE_OTHER): Payer: Medicare Other | Admitting: Internal Medicine

## 2018-03-25 ENCOUNTER — Encounter (INDEPENDENT_AMBULATORY_CARE_PROVIDER_SITE_OTHER): Payer: Self-pay | Admitting: Internal Medicine

## 2018-03-25 ENCOUNTER — Encounter (INDEPENDENT_AMBULATORY_CARE_PROVIDER_SITE_OTHER): Payer: Self-pay | Admitting: *Deleted

## 2018-03-25 VITALS — BP 130/100 | HR 60 | Temp 98.0°F | Ht 71.0 in | Wt 200.1 lb

## 2018-03-25 DIAGNOSIS — B182 Chronic viral hepatitis C: Secondary | ICD-10-CM | POA: Diagnosis not present

## 2018-03-25 LAB — HEPATIC FUNCTION PANEL
AG Ratio: 1.6 (calc) (ref 1.0–2.5)
ALKALINE PHOSPHATASE (APISO): 78 U/L (ref 40–115)
ALT: 13 U/L (ref 9–46)
AST: 19 U/L (ref 10–35)
Albumin: 4.4 g/dL (ref 3.6–5.1)
BILIRUBIN DIRECT: 0.1 mg/dL (ref 0.0–0.2)
Globulin: 2.7 g/dL (calc) (ref 1.9–3.7)
Indirect Bilirubin: 0.4 mg/dL (calc) (ref 0.2–1.2)
Total Bilirubin: 0.5 mg/dL (ref 0.2–1.2)
Total Protein: 7.1 g/dL (ref 6.1–8.1)

## 2018-03-25 NOTE — Progress Notes (Signed)
Subjective:    Patient ID: Ian Medina, male    DOB: 02-09-1956, 62 y.o.   MRN: [id number]  HPI Here today for f/u. Las seen in April of 2018. Hx of Hepatitis C successfully treated in 2018 with Harvoni. Genotype 1A.  Has received Hep B vaccine at [facility]. He tells me he is doing good. Appetite is good. He has gained weight. He is a resident [facility] x many hears.  08/08/2017  IMPRESSION: 1. No focal abnormality within the liver to suggest hepatoma. 2. Cholelithiasis 02/02/2018 Hep C quaint undetected.  Hepatic Function Latest Ref Rng & Units 03/05/2017 02/02/2017 08/31/2016  Total Protein 6.1 - 8.1 g/dL 7.1 7.0 8.1  Albumin 3.6 - 5.1 g/dL 4.1 4.0 4.3  AST 10 - 35 U/L 19 15 24   ALT 9 - 46 U/L 13 9 13(L)  Alk Phosphatase 40 - 115 U/L 69 72 64  Total Bilirubin 0.2 - 1.2 mg/dL 0.4 0.4 0.6  Bilirubin, Direct <=0.2 mg/dL 0.1 0.1 -   CBC    Component Value Date/Time   WBC 13.2 (H) 03/05/2017 1008   RBC 4.70 03/05/2017 1008   HGB 14.7 03/05/2017 1008   HGB 14.9 12/12/2011 1726   HCT 44.3 03/05/2017 1008   HCT 43.6 12/12/2011 1726   PLT 208 03/05/2017 1008   PLT 169 12/12/2011 1726   MCV 94.3 03/05/2017 1008   MCV 100 12/12/2011 1726   MCH 31.3 03/05/2017 1008   MCHC 33.2 03/05/2017 1008   RDW 14.2 03/05/2017 1008   RDW 13.6 12/12/2011 1726   LYMPHSABS 2.4 08/31/2016 1004   MONOABS 0.9 08/31/2016 1004   EOSABS 0.2 08/31/2016 1004   BASOSABS 0.0 08/31/2016 1004      Review of Systems Past Medical History:  Diagnosis Date  . Asthma   . Drug abuse ([place])   . Hepatitis C   . Hypercholesterolemia   . Schizophrenia ([place])    diagnosed at age 34, treated by ACT team      No Known Allergies  Current Outpatient Medications on File Prior to Visit  Medication Sig Dispense Refill  . FLUoxetine (PROZAC) 20 MG capsule Take 20 mg by mouth daily.    . fluticasone (FLOVENT HFA) 220 MCG/ACT inhaler Inhale into the lungs 2 (two) times daily.    .  montelukast (SINGULAIR) 10 MG tablet Take 10 mg by mouth at bedtime.    . niacin (NIASPAN) 1000 MG CR tablet Take 1,000 mg by mouth at bedtime.    Marland Kitchen OLANZapine (ZYPREXA) 10 MG tablet Take 20 mg by mouth at bedtime.     . pantoprazole (PROTONIX) 40 MG tablet Take 40 mg by mouth daily.    . sucralfate (CARAFATE) 1 G tablet Take 1 g by mouth 4 (four) times daily.    . traZODone (DESYREL) 50 MG tablet Take 50 mg by mouth at bedtime.    . vitamin B-12 (CYANOCOBALAMIN) 1000 MCG tablet Take 1,000 mcg by mouth daily.     No current facility-administered medications on file prior to visit.         Objective:   Physical Exam   Blood pressure (!) 130/100, pulse 60, temperature 98 F (36.7 C), height 5' 11"  (1.803 m), weight 200 lb 1.6 oz (90.8 kg). Alert and oriented. Skin warm and dry. Oral mucosa is moist.   . Sclera anicteric, conjunctivae is pink. Thyroid not enlarged. No cervical lymphadenopathy. Lungs clear. Heart regular rate and rhythm.  Abdomen is soft. Bowel sounds  are positive. No hepatomegaly. No abdominal masses felt. No tenderness.  No edema to lower extremities.           Assessment & Plan:  Hepatitis C. He is doing well. Will get Hep C quaint, Hepatic, and [place] RUQ\ OV in 1 year

## 2018-03-25 NOTE — Patient Instructions (Signed)
OV in 1 year.  

## 2018-03-27 LAB — HEPATITIS C RNA QUANTITATIVE
HCV QUANT LOG: NOT DETECTED {Log_IU}/mL
HCV RNA, PCR, QN: NOT DETECTED [IU]/mL

## 2018-03-28 ENCOUNTER — Ambulatory Visit (HOSPITAL_COMMUNITY): Payer: Medicare Other

## 2018-04-04 ENCOUNTER — Ambulatory Visit (HOSPITAL_COMMUNITY)
Admission: RE | Admit: 2018-04-04 | Discharge: 2018-04-04 | Disposition: A | Discharge status: Home / Self Care | Payer: Medicare Other | Source: Ambulatory Visit | Attending: Internal Medicine | Admitting: Internal Medicine

## 2018-04-04 DIAGNOSIS — B182 Chronic viral hepatitis C: Secondary | ICD-10-CM | POA: Diagnosis not present

## 2018-04-04 DIAGNOSIS — K8020 Calculus of gallbladder without cholecystitis without obstruction: Secondary | ICD-10-CM | POA: Diagnosis not present

## 2019-07-16 IMAGING — US US ABDOMEN LIMITED
1 series · 14 of 25 positions shown · non-contrast
Comparison: 08/08/2017

CLINICAL DATA: Hepatitis C

EXAM:
ULTRASOUND ABDOMEN LIMITED RIGHT UPPER QUADRANT

[Series 1: us abdomen limited · 0.21mm/px · 14 of 60 slices shown]
[im 1/60]
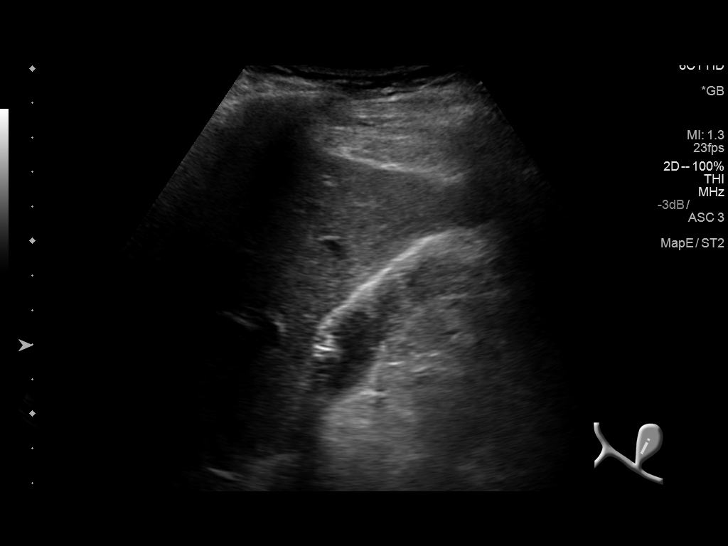
[im 5/60]
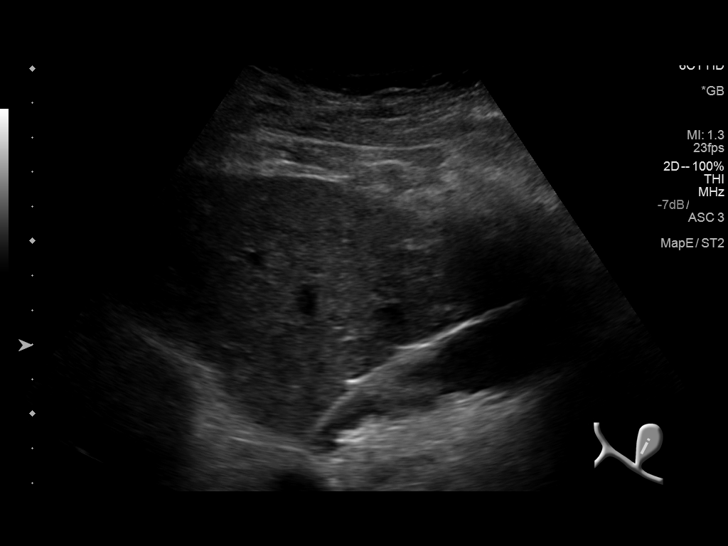
[im 10/60]
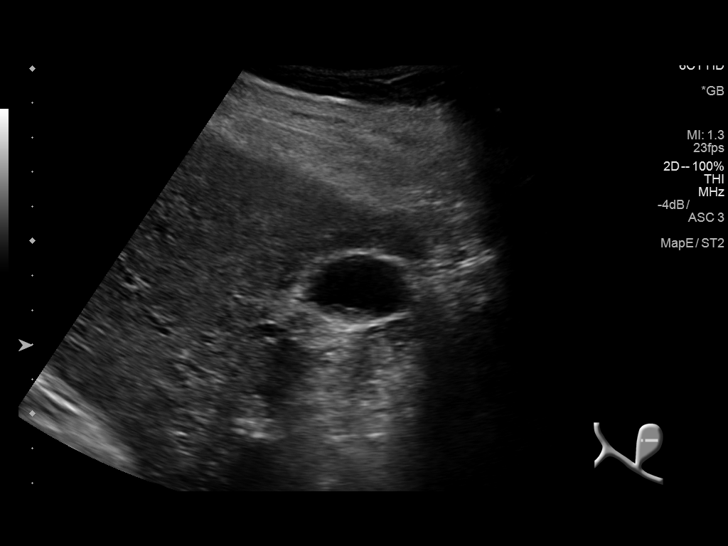
[im 15/60]
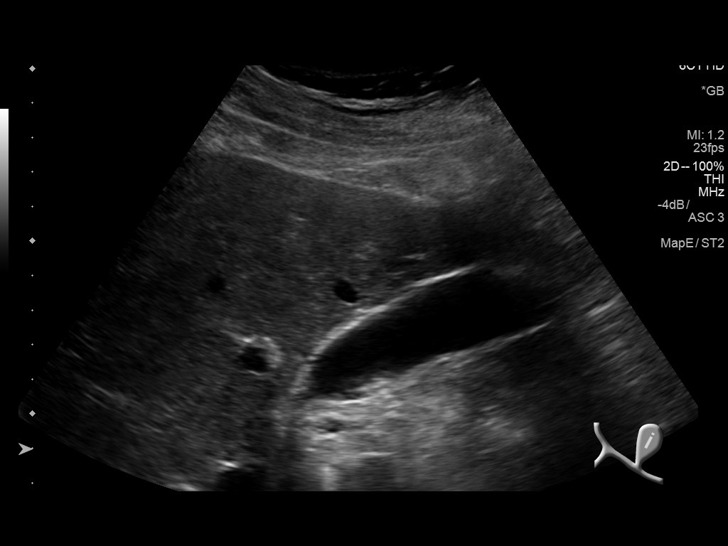
[im 20/60]
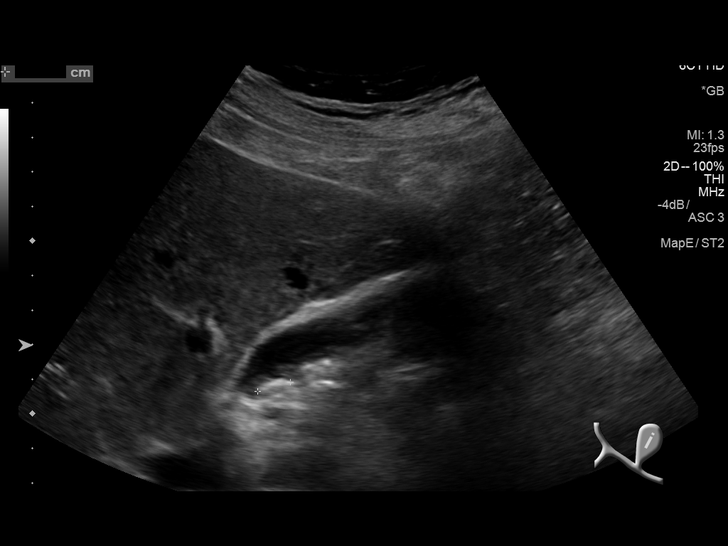
[im 23/60]
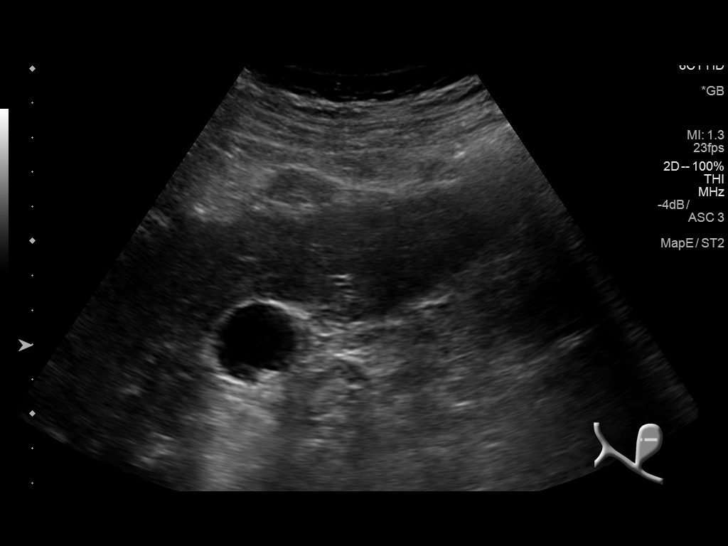
[im 28/60]
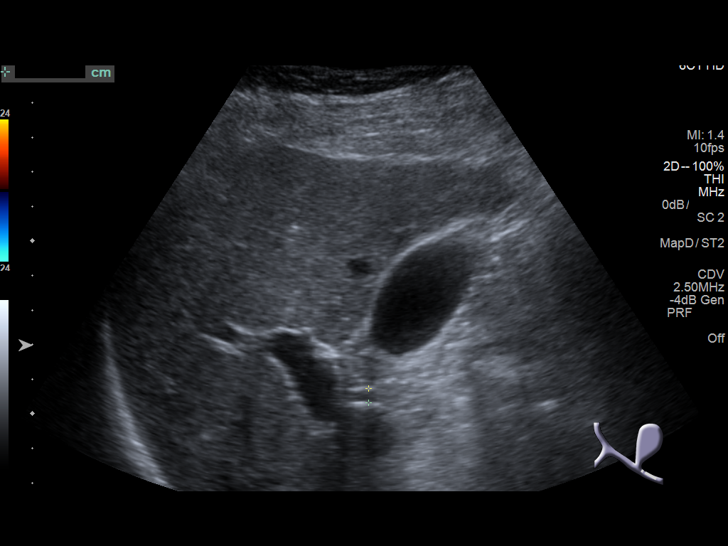
[im 32/60]
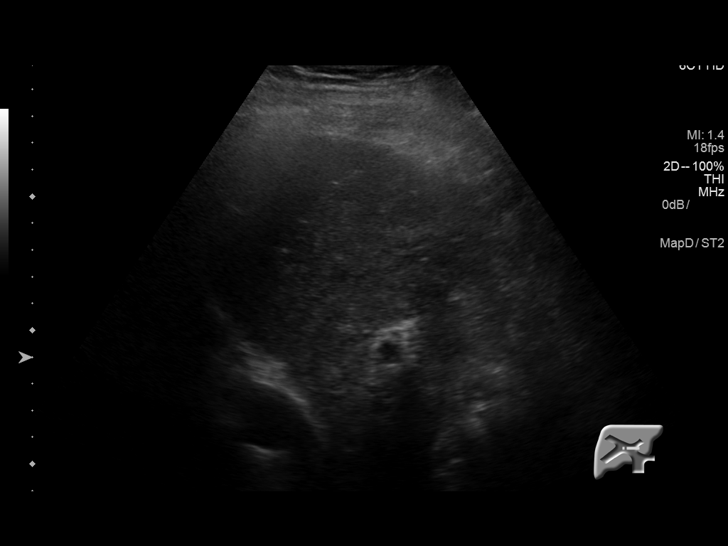
[im 37/60]
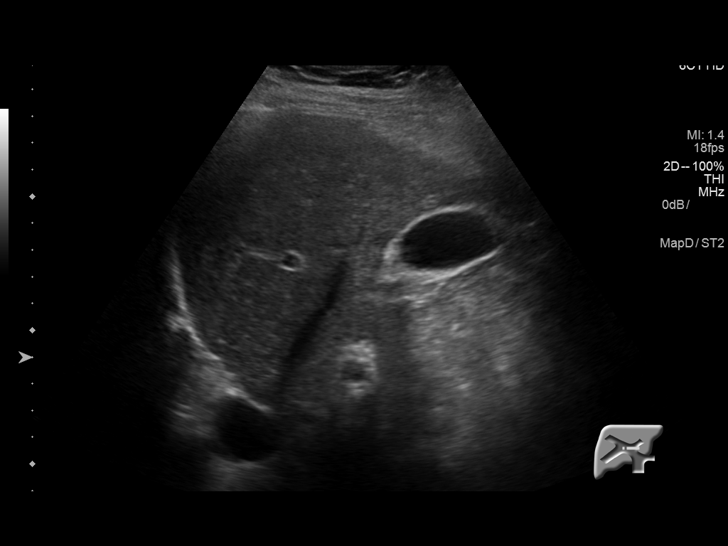
[im 40/60]
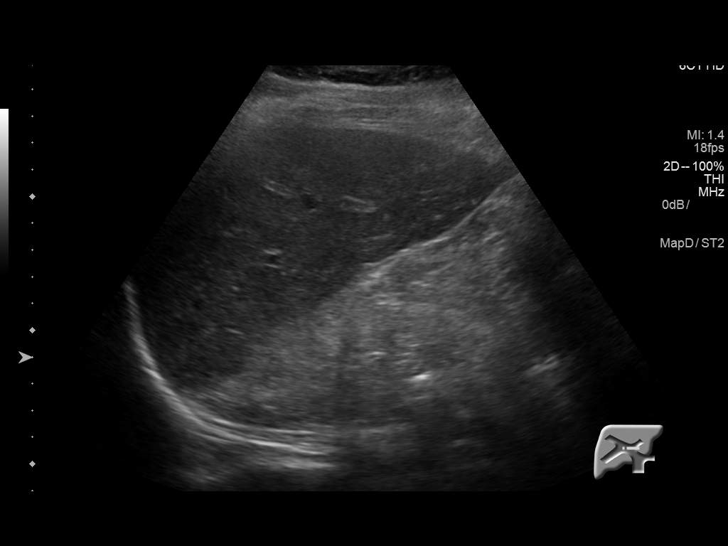
[im 45/60]
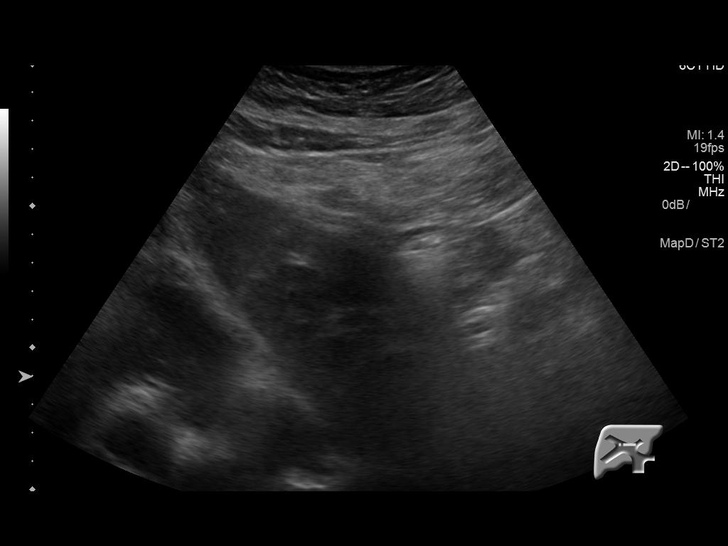
[im 50/60]
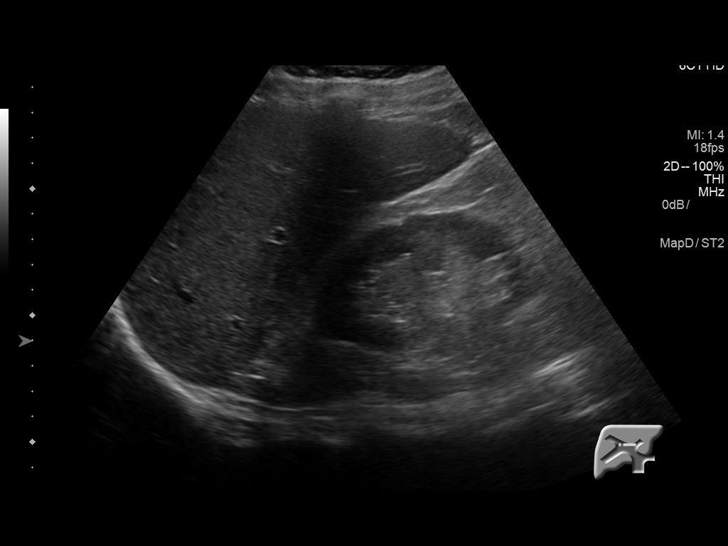
[im 55/60]
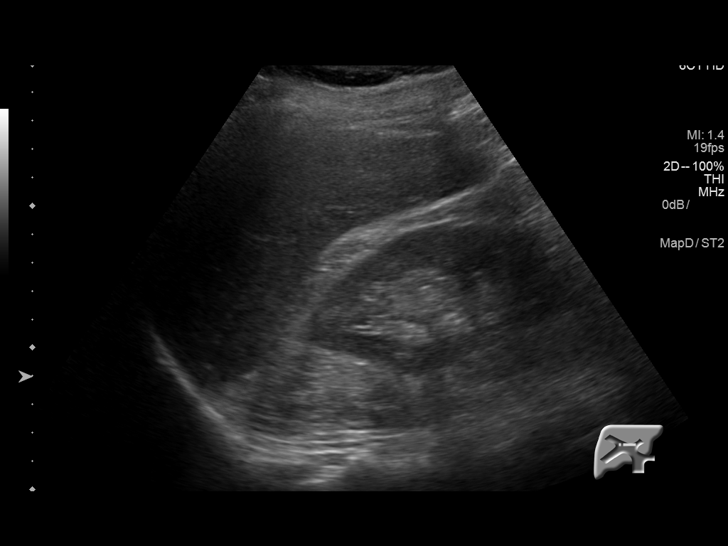
[im 60/60]
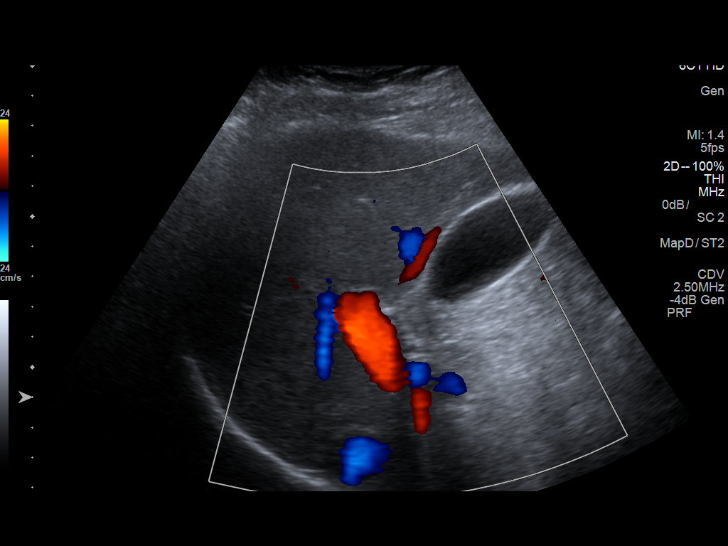

[14 of 25 positions shown; findings below may reference images not displayed]

FINDINGS: Gallbladder:

Normally distended. Dependent minimally shadowing calculi up to 10
mm diameter. No gallbladder wall thickening, pericholecystic fluid
or sonographic Murphy sign

Common bile duct:

Diameter: Normal caliber 4 mm diameter

Liver:

Normal appearance. No mass or nodularity. Portal vein is patent on
color Doppler imaging with normal direction of blood flow towards
the liver.

No RIGHT upper quadrant free fluid
IMPRESSION: Cholelithiasis.

Otherwise negative exam.

## 2022-01-17 ENCOUNTER — Other Ambulatory Visit: Payer: Self-pay

## 2022-01-17 ENCOUNTER — Encounter (HOSPITAL_COMMUNITY): Payer: Self-pay | Admitting: *Deleted

## 2022-01-17 ENCOUNTER — Emergency Department (HOSPITAL_COMMUNITY)
Admission: EM | Admit: 2022-01-17 | Discharge: 2022-01-17 | Disposition: A | Discharge status: Home / Self Care | Payer: 59 | Attending: Emergency Medicine | Admitting: Emergency Medicine

## 2022-01-17 DIAGNOSIS — S0001XA Abrasion of scalp, initial encounter: Secondary | ICD-10-CM | POA: Diagnosis not present

## 2022-01-17 DIAGNOSIS — W19XXXA Unspecified fall, initial encounter: Secondary | ICD-10-CM | POA: Diagnosis not present

## 2022-01-17 DIAGNOSIS — Z20822 Contact with and (suspected) exposure to covid-19: Secondary | ICD-10-CM | POA: Insufficient documentation

## 2022-01-17 DIAGNOSIS — Z139 Encounter for screening, unspecified: Secondary | ICD-10-CM

## 2022-01-17 DIAGNOSIS — R296 Repeated falls: Secondary | ICD-10-CM | POA: Diagnosis not present

## 2022-01-17 HISTORY — DX: Gastro-esophageal reflux disease without esophagitis: K21.9

## 2022-01-17 HISTORY — DX: Auditory hallucinations: R44.0

## 2022-01-17 HISTORY — DX: Mild intellectual disabilities: F70

## 2022-01-17 HISTORY — DX: Delusional disorders: F22

## 2022-01-17 HISTORY — DX: Chronic obstructive pulmonary disease, unspecified: J44.9

## 2022-01-17 HISTORY — DX: Generalized anxiety disorder: F41.1

## 2022-01-17 HISTORY — DX: Essential (primary) hypertension: I10

## 2022-01-17 HISTORY — DX: Schizoaffective disorder, depressive type: F25.1

## 2022-01-17 LAB — URINALYSIS, ROUTINE W REFLEX MICROSCOPIC
Bilirubin Urine: NEGATIVE
Glucose, UA: NEGATIVE mg/dL
Hgb urine dipstick: NEGATIVE
Ketones, ur: NEGATIVE mg/dL
Leukocytes,Ua: NEGATIVE
Nitrite: NEGATIVE
Protein, ur: NEGATIVE mg/dL
Specific Gravity, Urine: 1.01 (ref 1.005–1.030)
pH: 7 (ref 5.0–8.0)

## 2022-01-17 LAB — BASIC METABOLIC PANEL
Anion gap: 8 (ref 5–15)
BUN: 14 mg/dL (ref 8–23)
CO2: 28 mmol/L (ref 22–32)
Calcium: 9.5 mg/dL (ref 8.9–10.3)
Chloride: 102 mmol/L (ref 98–111)
Creatinine, Ser: 1 mg/dL (ref 0.61–1.24)
GFR, Estimated: >60 mL/min (ref >60)
Glucose, Bld: 93 mg/dL (ref 70–99)
Potassium: 4 mmol/L (ref 3.5–5.1)
Sodium: 138 mmol/L (ref 135–145)

## 2022-01-17 LAB — CBC WITH DIFFERENTIAL/PLATELET
Abs Immature Granulocytes: 0.03 10*3/uL (ref 0.00–0.07)
Basophils Absolute: 0.1 10*3/uL (ref 0.0–0.1)
Basophils Relative: 1 %
Eosinophils Absolute: 0.1 10*3/uL (ref 0.0–0.5)
Eosinophils Relative: 1 %
HCT: 43.2 % (ref 39.0–52.0)
Hemoglobin: 14.3 g/dL (ref 13.0–17.0)
Immature Granulocytes: 0 %
Lymphocytes Relative: 50 %
Lymphs Abs: 5.6 10*3/uL — ABNORMAL HIGH (ref 0.7–4.0)
MCH: 32.6 pg (ref 26.0–34.0)
MCHC: 33.1 g/dL (ref 30.0–36.0)
MCV: 98.6 fL (ref 80.0–100.0)
Monocytes Absolute: 0.5 10*3/uL (ref 0.1–1.0)
Monocytes Relative: 5 %
Neutro Abs: 4.8 10*3/uL (ref 1.7–7.7)
Neutrophils Relative %: 43 %
Platelets: 183 10*3/uL (ref 150–400)
RBC: 4.38 MIL/uL (ref 4.22–5.81)
RDW: 13.5 % (ref 11.5–15.5)
WBC: 11.1 10*3/uL — ABNORMAL HIGH (ref 4.0–10.5)
nRBC: 0 % (ref 0.0–0.2)

## 2022-01-17 LAB — RESP PANEL BY RT-PCR (FLU A&B, COVID) ARPGX2
Influenza A by PCR: NEGATIVE
Influenza B by PCR: NEGATIVE
SARS Coronavirus 2 by RT PCR: NEGATIVE

## 2022-01-17 NOTE — ED Notes (Signed)
ED Provider at bedside. 

## 2022-01-17 NOTE — ED Triage Notes (Signed)
Pt brought in by [facility] from [place] with c/o seeral falls recently. No injury from the falls. Denies pain.

## 2022-01-17 NOTE — ED Provider Notes (Signed)
[facility] EMERGENCY DEPARTMENT Provider Note   CSN: [id number] Arrival date & time: 01/17/22  1115     History  Chief Complaint  Patient presents with   Ian Medina is a 66 y.o. male.   Fall Pertinent negatives include no chest pain, no abdominal pain, no headaches and no shortness of breath.      Ian Medina is a 65 y.o. male with past medical history significant for hepatitis C, schizophrenia, COPD and who resides at [facility] who presents to the Emergency Department accompanied by guardian who is requesting evaluation for frequent falls.  Reported to have fallen twice this morning.  Patient states that his shoes are too big which causes him to trip and fall.  He denies pain or injury related to his falls.  Guardian states that he has lost significant amount of weight since last year.  She also endorses increased urination and thirst.  Falls today were witnessed.  No head injury or LOC.  Patient does not take blood thinners.  No history of diabetes.  Sent here for evaluation of his frequent falls.   Home Medications Prior to Admission medications   Medication Sig Start Date End Date Taking? Authorizing Provider  FLUoxetine (PROZAC) 20 MG capsule Take 20 mg by mouth daily.    [provider]  fluticasone (FLOVENT HFA) 220 MCG/ACT inhaler Inhale into the lungs 2 (two) times daily.    [provider]  montelukast (SINGULAIR) 10 MG tablet Take 10 mg by mouth at bedtime.    [provider]  niacin (NIASPAN) 1000 MG CR tablet Take 1,000 mg by mouth at bedtime.    [provider]  OLANZapine (ZYPREXA) 10 MG tablet Take 20 mg by mouth at bedtime.     [provider]  pantoprazole (PROTONIX) 40 MG tablet Take 40 mg by mouth daily.    [provider]  sucralfate (CARAFATE) 1 G tablet Take 1 g by mouth 4 (four) times daily.    [provider]  traZODone (DESYREL) 50 MG tablet Take 50  mg by mouth at bedtime.    [provider]  vitamin B-12 (CYANOCOBALAMIN) 1000 MCG tablet Take 1,000 mcg by mouth daily.    [provider]      Allergies    Patient has no known allergies.    Review of Systems   Review of Systems  Eyes:  Negative for visual disturbance.  Respiratory:  Negative for shortness of breath.   Cardiovascular:  Negative for chest pain.  Gastrointestinal:  Negative for abdominal pain, diarrhea, nausea and vomiting.  Musculoskeletal:  Negative for back pain and neck pain.  Skin:  Negative for color change and wound.  Neurological:  Negative for dizziness, seizures, syncope and headaches.  Psychiatric/Behavioral:  Negative for confusion.   All other systems reviewed and are negative.  Physical Exam Updated Vital Signs BP 120/81 (BP Location: Left Arm)    Pulse 73    Temp 97.8 F (36.6 C) (Oral)    Resp 16    Ht 6' (1.829 m)    Wt 78 kg    SpO2 97%    BMI 23.33 kg/m  Physical Exam Vitals and nursing note reviewed.  Constitutional:      Appearance: Normal appearance. He is not ill-appearing.  HENT:     Head:     Comments: Two abrasions to scalp.  No hematoma    Mouth/Throat:     Mouth: Mucous  membranes are moist.  Eyes:     Extraocular Movements: Extraocular movements intact.     Conjunctiva/sclera: Conjunctivae normal.     Pupils: Pupils are equal, round, and reactive to light.  Cardiovascular:     Rate and Rhythm: Normal rate and regular rhythm.     Pulses: Normal pulses.  Pulmonary:     Effort: Pulmonary effort is normal.  Abdominal:     General: There is no distension.     Palpations: Abdomen is soft.     Tenderness: There is no abdominal tenderness.  Musculoskeletal:        General: Normal range of motion.     Cervical back: Normal range of motion. No tenderness.  Skin:    General: Skin is warm.     Capillary Refill: Capillary refill takes less than 2 seconds.     Findings: No rash.  Neurological:     General: No focal  deficit present.     Mental Status: He is alert.     Sensory: No sensory deficit.     Motor: No weakness.    ED Results / Procedures / Treatments   Labs (all labs ordered are listed, but only abnormal results are displayed) Labs Reviewed  URINALYSIS, ROUTINE W REFLEX MICROSCOPIC - Abnormal; Notable for the following components:      Result Value   Color, Urine STRAW (*)    All other components within normal limits  CBC WITH DIFFERENTIAL/PLATELET - Abnormal; Notable for the following components:   WBC 11.1 (*)    Lymphs Abs 5.6 (*)    All other components within normal limits  RESP PANEL BY RT-PCR (FLU A&B, COVID) [id number]  BASIC METABOLIC PANEL    EKG EKG Interpretation  Date/Time:  Tuesday January 17 2022 13:21:00 EST Ventricular Rate:  66 PR Interval:  174 QRS Duration: 98 QT Interval:  412 QTC Calculation: 431 R Axis:   60 Text Interpretation: Normal sinus rhythm Normal ECG When compared with ECG of 24-Oct-2010 21:30, PR interval has decreased Confirmed by Lavenia Atlas ([id number]) on 01/17/2022 3:05:54 PM  Radiology No results found.  Procedures Procedures    Medications Ordered in ED Medications - No data to display  ED Course/ Medical Decision Making/ A&P                           Medical Decision Making Patient here from assisted living facility accompanied by guardian.  Guardian concerned about increased thirst and urination and frequent falls.  Concern he may be developing diabetes.  Falls today were witnessed and reported as minor.  No head injury or LOC.  Patient denies any headache, dizziness pain or shortness of breath.  Differential would include viral or bacterial infection.  Sepsis, pneumonia, new onset diabetes, new onset cardiac disease, UTI patient is nontoxic-appearing.  Vital signs vital signs also reassuring.  Vital signs also reassuring..  Patient has requested food has ambulated to the restroom with out difficulty to the restroom    Amount  and/or Complexity of Data Reviewed Independent Historian: guardian    Details: Guardian at bedside provides history information of fall along with the patient. External Data Reviewed: notes.    Details: Prior medical records reviewed Labs: ordered.    Details: Labs interpreted by me, no significant leukocytosis.  Chemistries without derangement, urinalysis without evidence of infection, COVID and flu testing are negative ECG/medicine tests: ordered.    Details: EKG obtained and reviewed by Dr. Dina Rich.  Showing normal sinus rhythm   Work-up reassuring.  Patient well-appearing nontoxic.  Vital signs also reassuring patient has ambulated to restroom w/o difficulty or need of assistance.  He has ate snack and drank fluids.  Tolerated well.  Discussed findings with pt and guardian.  I do not suspect acute process and pt continues to deny injury from his falls.  His exam is also reassuring .  I feel that he is appropriate for d/c back to facility and guardian agreeable.  Return precautions discussed.           Final Clinical Impression(s) / ED Diagnoses Final diagnoses:  Fall, initial encounter  Encounter for medical screening examination    Rx / DC Orders ED Discharge Orders     None         Kem Parkinson, [name] 01/19/22 1310    Horton, Alvin Critchley, DO 01/19/22 1728

## 2022-01-17 NOTE — ED Notes (Signed)
Pt discharged, accompanied by legal guardian to lobby to await staff from [facility] for pick up.  Report provided to facility

## 2022-01-17 NOTE — Discharge Instructions (Signed)
His work-up today was reassuring.  Please follow-up with his primary care provider.  Return emergency department for any new or worsening symptoms.

## 2022-01-17 NOTE — ED Notes (Signed)
Ambulated with steady gait to bathroom to provide specimen, inadequate volume to process, pt will try again later

## 2022-01-27 ENCOUNTER — Other Ambulatory Visit: Payer: Self-pay

## 2022-01-27 ENCOUNTER — Ambulatory Visit (INDEPENDENT_AMBULATORY_CARE_PROVIDER_SITE_OTHER): Payer: 59 | Admitting: Pulmonary Disease

## 2022-01-27 ENCOUNTER — Encounter: Payer: Self-pay | Admitting: Pulmonary Disease

## 2022-01-27 ENCOUNTER — Ambulatory Visit (HOSPITAL_COMMUNITY)
Admission: RE | Admit: 2022-01-27 | Discharge: 2022-01-27 | Disposition: A | Discharge status: Home / Self Care | Payer: 59 | Source: Ambulatory Visit | Attending: Pulmonary Disease | Admitting: Pulmonary Disease

## 2022-01-27 VITALS — BP 132/84 | HR 102 | Temp 98.4°F | Ht 72.0 in | Wt 166.4 lb

## 2022-01-27 DIAGNOSIS — J432 Centrilobular emphysema: Secondary | ICD-10-CM | POA: Diagnosis present

## 2022-01-27 DIAGNOSIS — Z716 Tobacco abuse counseling: Secondary | ICD-10-CM | POA: Diagnosis not present

## 2022-01-27 MED ORDER — ALBUTEROL SULFATE HFA 108 (90 BASE) MCG/ACT IN AERS: 2.0000 | INHALATION_SPRAY | Freq: Four times a day (QID) | RESPIRATORY_TRACT | 5 refills | Status: DC | PRN

## 2022-01-27 MED ORDER — TRELEGY ELLIPTA 100-62.5-25 MCG/ACT IN AEPB: 1.0000 | INHALATION_SPRAY | Freq: Every day | RESPIRATORY_TRACT | 5 refills | Status: DC

## 2022-01-27 NOTE — Progress Notes (Signed)
? ? Pulmonary, Critical Care, and Sleep Medicine ? ?Chief Complaint  ?Patient presents with  ? Consult  ?  Hx of COPD. SOB and cough   ? ? ?Past Surgical History:  ?He  has a past surgical history that includes Foot surgery; Chest tube insertion; Colonoscopy (Nov 2011); Esophagogastroduodenoscopy (June 2010); Colonoscopy (Sept 2009); and Esophagogastroduodenoscopy (Jan 2009). ? 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 ?Constitutional:  ?BP 132/84 (BP Location: Left Arm, Patient Position: Sitting)   Pulse (!) 102   Temp 98.4 ?F (36.9 ?C) (Temporal)   Ht 6' (1.829 m)   Wt 166 lb 6.4 oz (75.5 kg)   SpO2 99% Comment: ra  BMI 22.57 kg/m?  ? ?Brief Summary:  ?Ian Medina is a 66 y.o. male smoker with for assessment of COPD.  He lives in a group home and is a ward of the state.  He is not a reliable historian.  ?  ? ? ? ?Subjective:  ? ?He is accompanied by an aide from the group home. ? ?He reports growing up in [place].  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  His father had emphysema.  He used to smoke 2 ppd.  He is now only allowed to smoke 6 cigarettes per day in his group home.  He gets winded after walking 50 feet.  He has cough with clear sputum.  His aide reports that he has trouble using his inhalers effectively.   ? ?His previous CT chest showed moderate emphysema.  He had pneumonia in 2017.  He is not aware of having tuberculosis.  He has lost about 45 lbs over the past 3 to 4 years. ? ? ?Physical Exam:  ? ?Appearance - thin, fidgety ? ?ENMT - no sinus tenderness, no oral exudate, no LAN, Mallampati 3 airway, no stridor, edentulous ? ?Respiratory - decreased breath sounds bilaterally, no wheezing or rales ? ?CV - s1s2 regular rate and rhythm, no murmurs ? ?Ext - no clubbing, no edema ? ?Skin - no rashes ? ?Psych - normal mood and  affect ?  ?Pulmonary testing:  ? ? ?Chest Imaging:  ?CT chest 06/17/10 >> 5 mm nodule LLL, mild emphysema ?CT angio chest 05/05/16 >> moderate emphysema, RLL consolidation, tree in bud in RML, b/l patchy GGO ? ?Social History:  ?He  reports that he has been smoking cigarettes. He has been smoking an average of 1 pack per day. He has never used smokeless tobacco. He reports that he does not drink alcohol and does not use drugs. ? ?Family History:  ?His family history includes COPD in his father. ?  ? ? ?Assessment/Plan:  ? ?COPD with emphysema. ?- will transition him to trelegy 100 one puff daily ?- prn albuterol ?- inhaler technique demonstrated ?- will arrange for chest xray and pulmonary function test ? ?Tobacco abuse. ?- reviewed options to assist with smoking cessation ?- he used to smoke up to 2.5 ppd ?- he is only allowed to smoke 6 cigarettes per day in his group home ? ?Weight loss. ?- explained how this could be related to COPD ? ?Time Spent Involved in Patient Care on Day of Examination:  ?48 minutes ? ?Follow up:  ? ?Patient Instructions  ?Trelegy one puff daily, and rinse your mouth after each use ? ?Albuterol two puffs every 6 hours as needed for cough, wheeze, or chest  congestion ? ?Will arrange for chest xray and pulmonary function test at [facility] ? ?Follow up in 3 months ? ?Medication List:  ? ?Allergies as of 01/27/2022   ?No Known Allergies ?  ? ?  ?Medication List  ?  ? ?  ? Accurate as of January 27, 2022 10:48 AM. If you have any questions, ask your nurse or doctor.  ?  ?  ? ?  ? ?STOP taking these medications   ? ?fluticasone 220 MCG/ACT inhaler ?Commonly known as: FLOVENT HFA ?Stopped by: Coralyn Helling, MD ?  ? ?  ? ?TAKE these medications   ? ?albuterol 108 (90 Base) MCG/ACT inhaler ?Commonly known as: VENTOLIN HFA ?Inhale 2 puffs into the lungs every 6 (six) hours as needed for wheezing or shortness of breath. ?Started by: Coralyn Helling, MD ?  ?diazepam 5 MG tablet ?Commonly known as:  VALIUM ?Take 5 mg by mouth every 6 (six) hours as needed for anxiety. ?  ?FLUoxetine 20 MG capsule ?Commonly known as: PROZAC ?Take 20 mg by mouth daily. ?  ?gabapentin 100 MG capsule ?Commonly known as: NEURONTIN ?Take 100 mg by mouth 3 (three) times daily. ?  ?montelukast 10 MG tablet ?Commonly known as: SINGULAIR ?Take 10 mg by mouth at bedtime. ?  ?niacin 1000 MG CR tablet ?Commonly known as: NIASPAN ?Take 1,000 mg by mouth at bedtime. ?  ?OLANZapine 10 MG tablet ?Commonly known as: ZYPREXA ?Take 20 mg by mouth at bedtime. ?  ?pantoprazole 40 MG tablet ?Commonly known as: PROTONIX ?Take 40 mg by mouth daily. ?  ?sucralfate 1 g tablet ?Commonly known as: CARAFATE ?Take 1 g by mouth 4 (four) times daily. ?  ?traZODone 50 MG tablet ?Commonly known as: DESYREL ?Take 50 mg by mouth at bedtime. ?  ?Trelegy Ellipta 100-62.5-25 MCG/ACT Aepb ?Generic drug: Fluticasone-Umeclidin-Vilant ?Inhale 1 puff into the lungs daily. ?Started by: Coralyn Helling, MD ?  ?vitamin B-12 1000 MCG tablet ?Commonly known as: CYANOCOBALAMIN ?Take 1,000 mcg by mouth daily. ?  ? ?  ? ? ?Signature:  ?Coralyn Helling, MD ?[place] ?Pager - [phone] ?01/27/2022, 10:48 AM ?  ? ? ? ? ? ? ? ? ?

## 2022-01-27 NOTE — Patient Instructions (Signed)
Trelegy one puff daily, and rinse your mouth after each use ? ?Albuterol two puffs every 6 hours as needed for cough, wheeze, or chest congestion ? ?Will arrange for chest xray and pulmonary function test at [facility] ? ?Follow up in 3 months ?

## 2022-02-17 ENCOUNTER — Institutional Professional Consult (permissible substitution): Payer: Medicare Other | Admitting: Internal Medicine

## 2022-02-26 ENCOUNTER — Encounter (HOSPITAL_COMMUNITY): Payer: Self-pay | Admitting: Emergency Medicine

## 2022-02-26 ENCOUNTER — Emergency Department (HOSPITAL_COMMUNITY)
Admission: EM | Admit: 2022-02-26 | Discharge: 2022-02-26 | Disposition: A | Discharge status: Skilled Nursing Facility | Payer: 59 | Attending: Emergency Medicine | Admitting: Emergency Medicine

## 2022-02-26 ENCOUNTER — Emergency Department (HOSPITAL_COMMUNITY): Payer: 59

## 2022-02-26 DIAGNOSIS — J449 Chronic obstructive pulmonary disease, unspecified: Secondary | ICD-10-CM | POA: Diagnosis not present

## 2022-02-26 DIAGNOSIS — R079 Chest pain, unspecified: Secondary | ICD-10-CM

## 2022-02-26 DIAGNOSIS — R0789 Other chest pain: Secondary | ICD-10-CM | POA: Diagnosis not present

## 2022-02-26 DIAGNOSIS — I10 Essential (primary) hypertension: Secondary | ICD-10-CM | POA: Insufficient documentation

## 2022-02-26 LAB — URINALYSIS, ROUTINE W REFLEX MICROSCOPIC
Bilirubin Urine: NEGATIVE
Glucose, UA: NEGATIVE mg/dL
Hgb urine dipstick: NEGATIVE
Ketones, ur: NEGATIVE mg/dL
Leukocytes,Ua: NEGATIVE
Nitrite: NEGATIVE
Protein, ur: NEGATIVE mg/dL
Specific Gravity, Urine: 1.006 (ref 1.005–1.030)
pH: 7 (ref 5.0–8.0)

## 2022-02-26 LAB — CBC
HCT: 43 % (ref 39.0–52.0)
Hemoglobin: 13.8 g/dL (ref 13.0–17.0)
MCH: 31.2 pg (ref 26.0–34.0)
MCHC: 32.1 g/dL (ref 30.0–36.0)
MCV: 97.3 fL (ref 80.0–100.0)
Platelets: 175 10*3/uL (ref 150–400)
RBC: 4.42 MIL/uL (ref 4.22–5.81)
RDW: 13.5 % (ref 11.5–15.5)
WBC: 8.9 10*3/uL (ref 4.0–10.5)
nRBC: 0 % (ref 0.0–0.2)

## 2022-02-26 LAB — HEPATIC FUNCTION PANEL
ALT: 16 U/L (ref 0–44)
AST: 18 U/L (ref 15–41)
Albumin: 4.2 g/dL (ref 3.5–5.0)
Alkaline Phosphatase: 53 U/L (ref 38–126)
Bilirubin, Direct: <0.1 mg/dL (ref 0.0–0.2)
Total Bilirubin: 0.3 mg/dL (ref 0.3–1.2)
Total Protein: 7.3 g/dL (ref 6.5–8.1)

## 2022-02-26 LAB — DIFFERENTIAL
Abs Immature Granulocytes: 0.04 10*3/uL (ref 0.00–0.07)
Basophils Absolute: 0.1 10*3/uL (ref 0.0–0.1)
Basophils Relative: 1 %
Eosinophils Absolute: 0.1 10*3/uL (ref 0.0–0.5)
Eosinophils Relative: 1 %
Immature Granulocytes: 0 %
Lymphocytes Relative: 51 %
Lymphs Abs: 4.6 10*3/uL — ABNORMAL HIGH (ref 0.7–4.0)
Monocytes Absolute: 0.6 10*3/uL (ref 0.1–1.0)
Monocytes Relative: 7 %
Neutro Abs: 3.5 10*3/uL (ref 1.7–7.7)
Neutrophils Relative %: 40 %

## 2022-02-26 LAB — BASIC METABOLIC PANEL
Anion gap: 6 (ref 5–15)
BUN: 17 mg/dL (ref 8–23)
CO2: 30 mmol/L (ref 22–32)
Calcium: 9.4 mg/dL (ref 8.9–10.3)
Chloride: 103 mmol/L (ref 98–111)
Creatinine, Ser: 1.01 mg/dL (ref 0.61–1.24)
GFR, Estimated: >60 mL/min (ref >60)
Glucose, Bld: 81 mg/dL (ref 70–99)
Potassium: 4 mmol/L (ref 3.5–5.1)
Sodium: 139 mmol/L (ref 135–145)

## 2022-02-26 LAB — TROPONIN I (HIGH SENSITIVITY)
Troponin I (High Sensitivity): 2 ng/L (ref <18)
Troponin I (High Sensitivity): 2 ng/L (ref <18)

## 2022-02-26 MED ORDER — ALUM & MAG HYDROXIDE-SIMETH 200-200-20 MG/5ML PO SUSP
15.0000 mL | Freq: Once | ORAL | Status: AC
Start: 2022-02-26 — End: 2022-02-26
  Administered 2022-02-26: 15 mL via ORAL
  Filled 2022-02-26: qty 30

## 2022-02-26 NOTE — ED Triage Notes (Signed)
Pt arrives via EMS from [facility] group home with reports of pt falling and catching himself but complaining of central chest pain. Pt keeps asking for a soda during triage.  ?

## 2022-02-26 NOTE — ED Notes (Signed)
[name], [facility] Social Worker, called giving her cell phone number and stated that is "staff needed anything for the patient not to hesitate calling her directly on her cell phone." [phone]) ?

## 2022-02-26 NOTE — ED Notes (Signed)
Patient given sprite and legal guardian called, [name] and states she would reach out to the group home for someone to come pick up patient,. ? ?

## 2022-02-26 NOTE — ED Provider Notes (Signed)
?[facility] ?Provider Note ? ? ?CSN: [id number] ?Arrival date & time: 02/26/22  1040 ? ?  ? ?History ? ?Chief Complaint  ?Patient presents with  ? Chest Pain  ? ? ?Ian Medina is a 66 y.o. male. ? ? ?Chest Pain ? ?Patient tells me he started having central chest pain this morning.  It comes and goes, last for about 5 minutes.  He sometimes feels it in his right arm.  He is unsure what provokes the pain, time alleviates the pain.  He states pushing on his chest makes the pain worse.  No nausea, vomiting or shortness of breath.  Requesting a soda. ? ?History provided by Ms. Grace Blight, patient's legal guardian.  She states nursing home called her today because patient was having chest pain.  He is at the nursing home due to schizophrenia, he is unable to care for himself.  She states he has been having increased falls, he was seen on 01/17/2022 due to fall.  States these falls are typically unwitnessed, unsure if it is mechanical versus something underlying.  She states the last week he has been acting "ugly" to her and the staff which is unusual for him.  He has been followed for this by the medical team at [facility]. ? ?[facility] - Ms. Tammy - called out saying he was having chest pain. Patient was in on his knees, got up, ate breakfast and took his medicine. They checked his HR which was elevated at 112, BP was 87/67.  No new medication changes.  ? ?Patient's medical history is notable for schizophrenia, current tobacco dependence, COPD, substance use disorder, hypertension, hyperlipidemia, hepatitis C and reflux disease. ? ?Home Medications ?Prior to Admission medications   ?Medication Sig Start Date End Date Taking? Authorizing Provider  ?albuterol (VENTOLIN HFA) 108 (90 Base) MCG/ACT inhaler Inhale 2 puffs into the lungs every 6 (six) hours as needed for wheezing or shortness of breath. 01/27/22   Chesley Mires, MD  ?diazepam (VALIUM) 5 MG tablet Take 5 mg by mouth every 6 (six)  hours as needed for anxiety.    [provider]  ?FLUoxetine (PROZAC) 20 MG capsule Take 20 mg by mouth daily.    [provider]  ?Fluticasone-Umeclidin-Vilant (TRELEGY ELLIPTA) 100-62.5-25 MCG/ACT AEPB Inhale 1 puff into the lungs daily. 01/27/22   Chesley Mires, MD  ?gabapentin (NEURONTIN) 100 MG capsule Take 100 mg by mouth 3 (three) times daily. 01/25/22   [provider]  ?montelukast (SINGULAIR) 10 MG tablet Take 10 mg by mouth at bedtime.    [provider]  ?niacin (NIASPAN) 1000 MG CR tablet Take 1,000 mg by mouth at bedtime.    [provider]  ?OLANZapine (ZYPREXA) 10 MG tablet Take 20 mg by mouth at bedtime.     [provider]  ?pantoprazole (PROTONIX) 40 MG tablet Take 40 mg by mouth daily.    [provider]  ?sucralfate (CARAFATE) 1 G tablet Take 1 g by mouth 4 (four) times daily.    [provider]  ?traZODone (DESYREL) 50 MG tablet Take 50 mg by mouth at bedtime.    [provider]  ?vitamin B-12 (CYANOCOBALAMIN) 1000 MCG tablet Take 1,000 mcg by mouth daily.    [provider]  ?   ? ?Allergies    ?Patient has no known allergies.   ? ?Review of Systems   ?Review of Systems  ?Cardiovascular:  Positive for chest pain.  ? ?Physical Exam ?Updated Vital  Signs ?BP 122/73   Pulse 70   Temp 97.8 ?F (36.6 ?C) (Oral)   Resp 20   Ht 6' (1.829 m)   Wt 74.8 kg   SpO2 98%   BMI 22.38 kg/m?  ?Physical Exam ?Vitals and nursing note reviewed. Exam conducted with a chaperone present.  ?Constitutional:   ?   Appearance: Normal appearance.  ?HENT:  ?   Head: Normocephalic and atraumatic.  ?Eyes:  ?   General: No scleral icterus.    ?   Right eye: No discharge.     ?   Left eye: No discharge.  ?   Extraocular Movements: Extraocular movements intact.  ?   Pupils: Pupils are equal, round, and reactive to light.  ?Cardiovascular:  ?   Rate and Rhythm: Normal rate and regular rhythm.  ?   Pulses: Normal pulses.  ?   Heart sounds:  Normal heart sounds. No murmur heard. ?  No friction rub. No gallop.  ?Pulmonary:  ?   Effort: Pulmonary effort is normal. No respiratory distress.  ?   Breath sounds: Decreased breath sounds present.  ?Chest:  ?   Chest wall: Tenderness present.  ?   Comments: Localized, reproducible chest wall tenderness 2 inches below sternal notch. ?Abdominal:  ?   General: Abdomen is flat. Bowel sounds are normal. There is no distension.  ?   Palpations: Abdomen is soft.  ?   Tenderness: There is no abdominal tenderness.  ?Musculoskeletal:  ?   Right lower leg: No edema.  ?   Left lower leg: No edema.  ?Skin: ?   General: Skin is warm and dry.  ?   Coloration: Skin is not jaundiced.  ?Neurological:  ?   Mental Status: He is alert. Mental status is at baseline.  ?   Coordination: Coordination normal.  ?   Comments: Patient is roughly at his baseline.  ? ? ?ED Results / Procedures / Treatments   ?Labs ?(all labs ordered are listed, but only abnormal results are displayed) ?Labs Reviewed  ?URINALYSIS, ROUTINE W REFLEX MICROSCOPIC - Abnormal; Notable for the following components:  ?    Result Value  ? Color, Urine STRAW (*)   ? All other components within normal limits  ?DIFFERENTIAL - Abnormal; Notable for the following components:  ? Lymphs Abs 4.6 (*)   ? All other components within normal limits  ?BASIC METABOLIC PANEL  ?CBC  ?HEPATIC FUNCTION PANEL  ?TROPONIN I (HIGH SENSITIVITY)  ?TROPONIN I (HIGH SENSITIVITY)  ? ? ?EKG ?EKG Interpretation ? ?Date/Time:  Sunday February 26 2022 11:09:31 EDT ?Ventricular Rate:  74 ?PR Interval:  179 ?QRS Duration: 109 ?QT Interval:  394 ?QTC Calculation: 438 ?R Axis:   52 ?Text Interpretation: Sinus rhythm Probable left atrial enlargement T wave abnormality Artifact Abnormal ECG Confirmed by Carmin Muskrat [phone]) on 02/26/2022 11:49:31 AM ? ?Radiology ?DG Chest Port 1 View ? ?Result Date: 02/26/2022 ?CLINICAL DATA:  Chest pain EXAM: PORTABLE CHEST 1 VIEW COMPARISON:  01/27/2022 FINDINGS: The lungs  are clear without focal pneumonia, edema, pneumothorax or pleural effusion. Interstitial markings are diffusely coarsened with chronic features. The cardiopericardial silhouette is within normal limits for size. Bones are diffusely demineralized. Telemetry leads overlie the chest. IMPRESSION: No active disease. Electronically Signed   By: Misty Stanley M.D.   On: 02/26/2022 11:33   ? ?Procedures ?Procedures  ? ? ?Medications Ordered in ED ?Medications  ?alum & mag hydroxide-simeth (MAALOX/MYLANTA) 200-200-20 MG/5ML suspension 15 mL (15 mLs Oral Given  02/26/22 1200)  ? ? ?ED Course/ Medical Decision Making/ A&P ?Clinical Course as of 02/26/22 1343  ?Nancy Fetter Feb 26, 2022  ?1121 [id number] - [facility] ? [HS]  ?  ?Clinical Course User Index ?[HS] Sherrill Raring, [name]  ? ?                        ?Medical Decision Making ?Amount and/or Complexity of Data Reviewed ?Labs: ordered. ?Radiology: ordered. ? ?Risk ?OTC drugs. ? ? ?This patient presents to the ED for concern of chest pain, this involves an extensive number of treatment options, and is a complaint that carries with it a high risk of complications and morbidity.  The differential diagnosis includes but not limited to: ACS, PE, costochondritis, GERD, esophageal rupture, dissection, pneumonia, pneumothorax. ? ?Patient?s presentation is complicated by their history of schizophrenia, COPD with concurrent tobacco use, hep C, hypertension, hyperlipidemia. ? ? ?Additional history obtained:  ? ?Independent historian: Ms. Lynelle Smoke at [facility], patient's legal guardian Grace Blight.  Both contacted via telephone. ? ?  ?Lab Tests: ? ?I ordered, viewed, and personally interpreted labs.  The pertinent results include:   ?-Negative delta troponin. ?- BMP without any gross electrolyte derangement, normal LFTs. ?- UA unremarkable, no evidence suggesting a UTI. ?- CBC without leukocytosis or anemia ?  ?Imaging Studies ordered: ? ?I directly visualized the chest x-ray,  which showed no acute process ? ?I agree with the radiologist interpretation ?  ? ?ECG/Cardiac monitoring:  ? ?Per my interpretation, EKG shows sinus rhythm, some artifact but no obvious T wave changes or ischemi

## 2022-02-26 NOTE — ED Notes (Signed)
Patient given sprite and educated on the possibility of acid reflux ?

## 2022-02-26 NOTE — ED Notes (Signed)
Patient ambulated to the bathroom and was given 3 cups of water.  ?

## 2022-02-26 NOTE — Discharge Instructions (Signed)
His work-up today was reassuring.  Not sure what is causing his chest pain but it does not appear to be anything emergent related to his heart.  If things worsen obviously return back to the ED. ?

## 2022-04-18 ENCOUNTER — Ambulatory Visit (HOSPITAL_COMMUNITY): Payer: 59

## 2022-04-19 ENCOUNTER — Ambulatory Visit (HOSPITAL_COMMUNITY)
Admission: RE | Admit: 2022-04-19 | Discharge: 2022-04-19 | Disposition: A | Discharge status: Home / Self Care | Payer: 59 | Source: Ambulatory Visit | Attending: Pulmonary Disease | Admitting: Pulmonary Disease

## 2022-04-19 DIAGNOSIS — J432 Centrilobular emphysema: Secondary | ICD-10-CM | POA: Insufficient documentation

## 2022-04-19 LAB — PULMONARY FUNCTION TEST
DL/VA % pred: 78 %
DL/VA: 3.23 ml/min/mmHg/L
DLCO unc % pred: 69 %
DLCO unc: 19.83 ml/min/mmHg
FEF 25-75 Post: 4.79 L/sec
FEF 25-75 Pre: 3.2 L/sec
FEF2575-%Change-Post: 49 %
FEF2575-%Pred-Post: 161 %
FEF2575-%Pred-Pre: 108 %
FEV1-%Change-Post: 10 %
FEV1-%Pred-Post: 89 %
FEV1-%Pred-Pre: 81 %
FEV1-Post: 3.34 L
FEV1-Pre: 3.02 L
FEV1FVC-%Change-Post: -8 %
FEV1FVC-%Pred-Pre: 123 %
FEV6-%Change-Post: 21 %
FEV6-%Pred-Post: 83 %
FEV6-%Pred-Pre: 68 %
FEV6-Post: 3.95 L
FEV6-Pre: 3.25 L
FEV6FVC-%Pred-Post: 105 %
FEV6FVC-%Pred-Pre: 105 %
FVC-%Change-Post: 20 %
FVC-%Pred-Post: 79 %
FVC-%Pred-Pre: 65 %
FVC-Post: 3.95 L
FVC-Pre: 3.27 L
Post FEV1/FVC ratio: 84 %
Post FEV6/FVC ratio: 100 %
Pre FEV1/FVC ratio: 92 %
Pre FEV6/FVC Ratio: 100 %

## 2022-04-19 MED ORDER — ALBUTEROL SULFATE (2.5 MG/3ML) 0.083% IN NEBU
2.5000 mg | INHALATION_SOLUTION | Freq: Once | RESPIRATORY_TRACT | Status: AC
  Administered 2022-04-19: 2.5 mg via RESPIRATORY_TRACT

## 2022-05-02 ENCOUNTER — Ambulatory Visit (INDEPENDENT_AMBULATORY_CARE_PROVIDER_SITE_OTHER): Payer: 59 | Admitting: Pulmonary Disease

## 2022-05-02 ENCOUNTER — Encounter: Payer: Self-pay | Admitting: Pulmonary Disease

## 2022-05-02 VITALS — BP 134/72 | HR 96 | Temp 98.2°F | Ht 72.0 in | Wt 167.2 lb

## 2022-05-02 DIAGNOSIS — J449 Chronic obstructive pulmonary disease, unspecified: Secondary | ICD-10-CM

## 2022-05-02 MED ORDER — BUDESONIDE 0.25 MG/2ML IN SUSP: 0.2500 mg | Freq: Two times a day (BID) | RESPIRATORY_TRACT | 12 refills | Status: DC

## 2022-05-02 MED ORDER — ALBUTEROL SULFATE HFA 108 (90 BASE) MCG/ACT IN AERS: 2.0000 | INHALATION_SPRAY | Freq: Four times a day (QID) | RESPIRATORY_TRACT | 5 refills | Status: DC | PRN

## 2022-05-02 MED ORDER — IPRATROPIUM-ALBUTEROL 0.5-2.5 (3) MG/3ML IN SOLN: 3.0000 mL | Freq: Three times a day (TID) | RESPIRATORY_TRACT | 5 refills | Status: DC

## 2022-05-02 NOTE — Progress Notes (Signed)
[facility]  Chief Complaint  Patient presents with   Follow-up    Coughing and wheezing pft done 04/19/2022    Past Surgical History:  He  has a past surgical history that includes Foot surgery; Chest tube insertion; Colonoscopy (Nov 2011); Esophagogastroduodenoscopy (June 2010); Colonoscopy (Sept 2009); and Esophagogastroduodenoscopy (Jan 2009).  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 male smoker with for assessment of COPD.  He lives in a group home and is a ward of the state.  He is not a reliable historian.       Subjective:   He is accompanied by an aide from the group home.  Chest xray from 02/26/22 showed clearing of Lt base infiltrate compared to 01/28/22.  He did PFT in May, but had difficulty doing test maneuvers.  He continues to smoke cigarettes.  There is concern he isn't using trelegy effectively.  He gets more cough and wheeze if he doesn't use trelegy.   Physical Exam:   Appearance - thin, fidgety  ENMT - no sinus tenderness, no oral exudate, no LAN, Mallampati 3 airway, no stridor, edentulous  Respiratory - decreased breath sounds bilaterally, no wheezing or rales  CV - s1s2 regular rate and rhythm, no murmurs  Ext - no clubbing, no edema  Skin - no rashes  Psych - normal mood and affect   Pulmonary testing:  PFT 04/19/22 >> FEV1 3.34 (89%), FEV1% 84, DLCO 69%, +BD  Chest Imaging:  CT chest 06/17/10 >> 5 mm nodule LLL, mild emphysema CT angio chest 05/05/16 >> moderate emphysema, RLL consolidation, tree in bud in RML, b/l  patchy GGO  Social History:  He  reports that he has been smoking cigarettes. He has been smoking an average of 1 pack per day. He has never used smokeless tobacco. He reports that he does not drink alcohol and does not use drugs.  Family History:  His family history includes COPD in his father.     Assessment/Plan:   COPD with emphysema. - he was not able to perform PFT test maneuvers - he isn't able to use inhalers with proper technique consistently - will arrange for home nebulizer - will have him start pulmicort 0.25 mg bid and duoneb tid - prn ventolin  Tobacco abuse. - reviewed options to assist with smoking cessation, but he is not interesting quitting - he used to smoke up to 2.5 ppd - he is only allowed to smoke 6 cigarettes per day in his group home  Weight loss. - weight steady compared to visit from 303/23  Time Spent Involved in Patient Care on Day of Examination:  36 minutes  Follow up:   Patient Instructions  Will arrange for a home nebulizer.  Use pulmicort 1 vial in the morning and evening by nebulizer, and rinse your mouth after each use.  Use duoneb in the morning, after lunch, and in the evening by nebulizer.  You can stop using trelegy once you get your nebulizer medicine.  Follow up in 6 months.  Medication  List:   Allergies as of 05/02/2022       Reactions   Propoxyphene Rash        Medication List        Accurate as of May 02, 2022  8:54 AM. If you have any questions, ask your nurse or doctor.          STOP taking these medications    Trelegy Ellipta 100-62.5-25 MCG/ACT Aepb Generic drug: Fluticasone-Umeclidin-Vilant Stopped by: Coralyn Helling, MD       TAKE these medications    albuterol 108 (90 Base) MCG/ACT inhaler Commonly known as: VENTOLIN HFA Inhale 2 puffs into the lungs every 6 (six) hours as needed for wheezing or shortness of breath.   aspirin 81 MG chewable tablet 1 tablet   budesonide 0.25 MG/2ML  nebulizer solution Commonly known as: Pulmicort Take 2 mLs (0.25 mg total) by nebulization 2 (two) times daily. Started by: Coralyn Helling, MD   cetirizine 10 MG tablet Commonly known as: ZYRTEC Take 10 mg by mouth daily.   chlorproMAZINE 50 MG tablet Commonly known as: THORAZINE Take by mouth.   diazepam 5 MG tablet Commonly known as: VALIUM Take 5 mg by mouth every 6 (six) hours as needed for anxiety.   FLUoxetine 20 MG capsule Commonly known as: PROZAC Take 20 mg by mouth daily.   gabapentin 100 MG capsule Commonly known as: NEURONTIN Take 100 mg by mouth 3 (three) times daily.   Ingrezza 40 MG capsule Generic drug: valbenazine Take 40 mg by mouth daily.   ipratropium-albuterol 0.5-2.5 (3) MG/3ML Soln Commonly known as: DUONEB Take 3 mLs by nebulization 3 (three) times daily. Started by: Coralyn Helling, MD   lovastatin 20 MG tablet Commonly known as: MEVACOR Take 20 mg by mouth daily.   montelukast 10 MG tablet Commonly known as: SINGULAIR Take 10 mg by mouth at bedtime.   niacin 1000 MG CR tablet Commonly known as: NIASPAN Take 1,000 mg by mouth at bedtime.   OLANZapine 10 MG tablet Commonly known as: ZYPREXA Take 20 mg by mouth at bedtime.   pantoprazole 40 MG tablet Commonly known as: PROTONIX Take 40 mg by mouth daily.   sucralfate 1 g tablet Commonly known as: CARAFATE Take 1 g by mouth 4 (four) times daily.   traZODone 50 MG tablet Commonly known as: DESYREL Take 50 mg by mouth at bedtime.   vitamin B-12 1000 MCG tablet Commonly known as: CYANOCOBALAMIN Take 1,000 mcg by mouth daily.        Signature:  Coralyn Helling, MD [facility] Pager - [phone] 05/02/2022, 8:54 AM

## 2022-05-02 NOTE — Patient Instructions (Signed)
Will arrange for a home nebulizer.  Use pulmicort 1 vial in the morning and evening by nebulizer, and rinse your mouth after each use.  Use duoneb in the morning, after lunch, and in the evening by nebulizer.  You can stop using trelegy once you get your nebulizer medicine.  Follow up in 6 months.

## 2022-05-04 ENCOUNTER — Telehealth: Payer: Self-pay | Admitting: Pulmonary Disease

## 2022-05-04 NOTE — Telephone Encounter (Signed)
Noted.  Will close encounter.  

## 2022-06-02 ENCOUNTER — Other Ambulatory Visit: Payer: Self-pay

## 2022-06-02 ENCOUNTER — Encounter: Payer: Self-pay | Admitting: Emergency Medicine

## 2022-06-02 ENCOUNTER — Emergency Department
Admission: EM | Admit: 2022-06-02 | Discharge: 2022-06-07 | Disposition: A | Discharge status: Home / Self Care | Payer: 59 | Attending: Emergency Medicine | Admitting: Emergency Medicine

## 2022-06-02 DIAGNOSIS — R456 Violent behavior: Secondary | ICD-10-CM | POA: Insufficient documentation

## 2022-06-02 DIAGNOSIS — F1910 Other psychoactive substance abuse, uncomplicated: Secondary | ICD-10-CM | POA: Diagnosis not present

## 2022-06-02 DIAGNOSIS — R4689 Other symptoms and signs involving appearance and behavior: Secondary | ICD-10-CM | POA: Diagnosis not present

## 2022-06-02 DIAGNOSIS — I10 Essential (primary) hypertension: Secondary | ICD-10-CM | POA: Diagnosis not present

## 2022-06-02 DIAGNOSIS — Z20822 Contact with and (suspected) exposure to covid-19: Secondary | ICD-10-CM | POA: Diagnosis not present

## 2022-06-02 DIAGNOSIS — J449 Chronic obstructive pulmonary disease, unspecified: Secondary | ICD-10-CM | POA: Diagnosis not present

## 2022-06-02 DIAGNOSIS — F22 Delusional disorders: Secondary | ICD-10-CM | POA: Insufficient documentation

## 2022-06-02 LAB — COMPREHENSIVE METABOLIC PANEL
ALT: 15 U/L (ref 0–44)
AST: 19 U/L (ref 15–41)
Albumin: 4 g/dL (ref 3.5–5.0)
Alkaline Phosphatase: 46 U/L (ref 38–126)
Anion gap: 6 (ref 5–15)
BUN: 18 mg/dL (ref 8–23)
CO2: 27 mmol/L (ref 22–32)
Calcium: 9.1 mg/dL (ref 8.9–10.3)
Chloride: 107 mmol/L (ref 98–111)
Creatinine, Ser: 1.05 mg/dL (ref 0.61–1.24)
GFR, Estimated: >60 mL/min (ref >60)
Glucose, Bld: 101 mg/dL — ABNORMAL HIGH (ref 70–99)
Potassium: 3.6 mmol/L (ref 3.5–5.1)
Sodium: 140 mmol/L (ref 135–145)
Total Bilirubin: 0.6 mg/dL (ref 0.3–1.2)
Total Protein: 6.6 g/dL (ref 6.5–8.1)

## 2022-06-02 LAB — CBC
HCT: 39.3 % (ref 39.0–52.0)
Hemoglobin: 12.7 g/dL — ABNORMAL LOW (ref 13.0–17.0)
MCH: 30.8 pg (ref 26.0–34.0)
MCHC: 32.3 g/dL (ref 30.0–36.0)
MCV: 95.4 fL (ref 80.0–100.0)
Platelets: 144 10*3/uL — ABNORMAL LOW (ref 150–400)
RBC: 4.12 MIL/uL — ABNORMAL LOW (ref 4.22–5.81)
RDW: 13.8 % (ref 11.5–15.5)
WBC: 10 10*3/uL (ref 4.0–10.5)
nRBC: 0 % (ref 0.0–0.2)

## 2022-06-02 LAB — URINALYSIS, ROUTINE W REFLEX MICROSCOPIC
Bilirubin Urine: NEGATIVE
Glucose, UA: NEGATIVE mg/dL
Hgb urine dipstick: NEGATIVE
Ketones, ur: NEGATIVE mg/dL
Leukocytes,Ua: NEGATIVE
Nitrite: NEGATIVE
Protein, ur: NEGATIVE mg/dL
Specific Gravity, Urine: 1.01 (ref 1.005–1.030)
pH: 6 (ref 5.0–8.0)

## 2022-06-02 NOTE — ED Notes (Signed)
Pt was dressed out into blue paper scrubs at this time Belongings were: 1 pair pants 1 black and white underwear 1 pair black shoes 1 green t shirt

## 2022-06-02 NOTE — ED Notes (Signed)
Pt denies any SI or HI at this time. Pt cooperative

## 2022-06-02 NOTE — ED Notes (Signed)
Pt given a coke at this time
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
## 2022-06-03 DIAGNOSIS — R456 Violent behavior: Secondary | ICD-10-CM | POA: Diagnosis not present

## 2022-06-03 DIAGNOSIS — R4689 Other symptoms and signs involving appearance and behavior: Secondary | ICD-10-CM

## 2022-06-03 LAB — RESP PANEL BY RT-PCR (FLU A&B, COVID) ARPGX2
Influenza A by PCR: NEGATIVE
Influenza B by PCR: NEGATIVE
SARS Coronavirus 2 by RT PCR: NEGATIVE

## 2022-06-03 MED ORDER — MONTELUKAST SODIUM 10 MG PO TABS
10.0000 mg | ORAL_TABLET | Freq: Every day | ORAL | Status: DC
  Administered 2022-06-03 – 2022-06-06 (×4): 10 mg via ORAL
  Filled 2022-06-03 (×4): qty 1

## 2022-06-03 MED ORDER — FLUOXETINE HCL 20 MG PO CAPS
20.0000 mg | ORAL_CAPSULE | Freq: Every day | ORAL | Status: DC
  Administered 2022-06-03 – 2022-06-07 (×5): 20 mg via ORAL
  Filled 2022-06-03 (×5): qty 1

## 2022-06-03 MED ORDER — CHLORPROMAZINE HCL 50 MG PO TABS
50.0000 mg | ORAL_TABLET | Freq: Every day | ORAL | Status: DC
  Administered 2022-06-03 – 2022-06-07 (×5): 50 mg via ORAL
  Filled 2022-06-03: qty 2
  Filled 2022-06-03 (×4): qty 1

## 2022-06-03 MED ORDER — BUDESONIDE 0.25 MG/2ML IN SUSP
0.2500 mg | Freq: Two times a day (BID) | RESPIRATORY_TRACT | Status: DC
  Filled 2022-06-03 (×8): qty 2

## 2022-06-03 MED ORDER — ALBUTEROL SULFATE HFA 108 (90 BASE) MCG/ACT IN AERS: 2.0000 | INHALATION_SPRAY | Freq: Four times a day (QID) | RESPIRATORY_TRACT | Status: DC | PRN

## 2022-06-03 MED ORDER — TRAZODONE HCL 50 MG PO TABS
50.0000 mg | ORAL_TABLET | Freq: Every day | ORAL | Status: DC
  Administered 2022-06-03 – 2022-06-06 (×4): 50 mg via ORAL
  Filled 2022-06-03 (×4): qty 1

## 2022-06-03 MED ORDER — PANTOPRAZOLE SODIUM 40 MG PO TBEC
40.0000 mg | DELAYED_RELEASE_TABLET | Freq: Every day | ORAL | Status: DC
  Administered 2022-06-03 – 2022-06-07 (×5): 40 mg via ORAL
  Filled 2022-06-03 (×5): qty 1

## 2022-06-03 MED ORDER — VALBENAZINE TOSYLATE 40 MG PO CAPS
40.0000 mg | ORAL_CAPSULE | Freq: Every day | ORAL | Status: DC
  Administered 2022-06-03 – 2022-06-07 (×5): 40 mg via ORAL
  Filled 2022-06-03 (×5): qty 1

## 2022-06-03 MED ORDER — OLANZAPINE 10 MG PO TABS
20.0000 mg | ORAL_TABLET | Freq: Every day | ORAL | Status: DC
  Administered 2022-06-03 – 2022-06-06 (×4): 20 mg via ORAL
  Filled 2022-06-03 (×4): qty 2

## 2022-06-03 MED ORDER — DIAZEPAM 5 MG PO TABS
5.0000 mg | ORAL_TABLET | Freq: Four times a day (QID) | ORAL | Status: DC | PRN
  Administered 2022-06-03 – 2022-06-07 (×6): 5 mg via ORAL
  Filled 2022-06-03 (×7): qty 1

## 2022-06-03 MED ORDER — ASPIRIN 81 MG PO CHEW
81.0000 mg | CHEWABLE_TABLET | Freq: Every day | ORAL | Status: DC
  Administered 2022-06-03 – 2022-06-07 (×5): 81 mg via ORAL
  Filled 2022-06-03 (×6): qty 1

## 2022-06-03 MED ORDER — GABAPENTIN 100 MG PO CAPS
100.0000 mg | ORAL_CAPSULE | Freq: Three times a day (TID) | ORAL | Status: DC
  Administered 2022-06-03 – 2022-06-07 (×14): 100 mg via ORAL
  Filled 2022-06-03 (×14): qty 1
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
## 2022-06-03 NOTE — ED Notes (Signed)
Pt needing constant re-direction to stay in room.    Pt asking for multiple drinks.  RN explained he could not have any more juice or soda at this time.- only water.

## 2022-06-03 NOTE — BH Assessment (Signed)
This writer attempted to reach pt's caregiver Sunday Spillers [phone] [phone]; however there was no answer. A HIPPA compliant voicemail was left requesting a call back.
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
## 2022-06-03 NOTE — ED Notes (Signed)
Snack given.
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
## 2022-06-03 NOTE — ED Provider Notes (Signed)
Emergency Medicine Observation Re-evaluation Note  Ian Medina is a 66 y.o. male, seen on rounds today.  Pt initially presented to the ED for complaints of Psychiatric Evaluation (/) Currently, the patient is resting.  Physical Exam  BP 128/84 (BP Location: Left Arm)   Pulse 92   Temp 97.6 F (36.4 C) (Oral)   Resp 16   Ht 6' (1.829 m)   Wt 75.8 kg   SpO2 100%   BMI 22.66 kg/m  Physical Exam Gen: No acute distress  Resp: Normal rise and fall of chest Neuro: Moving all four extremities Psych: Resting currently, calm and cooperative when awake    ED Course / MDM  EKG:   I have reviewed the labs performed to date as well as medications administered while in observation.  Recent changes in the last 24 hours include no acute events overnight.  Plan  Current plan is for likely discharge home to group home in the morning.  Ian Medina is not under involuntary commitment.     Ian Medina, Ian Maw, DO 06/03/22 [phone]

## 2022-06-04 DIAGNOSIS — R456 Violent behavior: Secondary | ICD-10-CM | POA: Diagnosis not present

## 2022-06-04 MED ORDER — BENZTROPINE MESYLATE 1 MG PO TABS
1.0000 mg | ORAL_TABLET | Freq: Two times a day (BID) | ORAL | Status: DC
  Administered 2022-06-04 – 2022-06-07 (×7): 1 mg via ORAL
  Filled 2022-06-04 (×7): qty 1

## 2022-06-04 NOTE — ED Notes (Signed)
Pt continues to engage with staff making ludicrous request ie. "My bed is to small I need a bigger one. I don't have a phone in my room can I get one."  Staff is unable to discern if these are actual request(s) or if patient is not able to have reality based conversation.  Spoke to patient guardian, [name], and she stated that pt has an easier time eating with a mech soft diet, that he is able to be up ad lib however has to be prompted with ADL's as well as be a x1-2 in the shower for safety.  Staff assured guardian that this would be addressed and passed onto subsequent nursing shifts.  Cont to monitor as ordered

## 2022-06-04 NOTE — ED Notes (Signed)
Pt given cup of water 

## 2022-06-04 NOTE — ED Notes (Signed)
Report to include Situation, Background, Assessment, and Recommendations received from katie RN. Patient alert and oriented, warm and dry, in no acute distress. Patient denies SI, HI, AVH and pain. Patient made aware of Q15 minute rounds and security cameras for their safety. Patient instructed to come to me with needs or concerns.  

## 2022-06-04 NOTE — ED Notes (Signed)
Hospital meal provided, pt tolerated w/o complaints.  Waste discarded appropriately.  

## 2022-06-04 NOTE — ED Notes (Signed)
Pt continues to  come to nursing station to request cups of water.  Staff has had to limit number of cups of water.  Pt also requests soda continuously.

## 2022-06-04 NOTE — ED Notes (Signed)
pt given lunch tray and diet coke
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
## 2022-06-04 NOTE — Progress Notes (Signed)
CSW spoke with [name] [phone], legal guardian with [facility]. CSW was told they are actively looking for new placement for patient. CSW was told hopefully they will have a place for him to go tomorrow. [name] stated their resources are limited in their county. [name] stated patients behaviors are not his normal and this isn't him. [name] stated that [facility] has changed his medications and this has not been beneficial to patient. [name] stated they will continue to keep everyone updated.

## 2022-06-04 NOTE — ED Notes (Signed)
Pt moved to BHU.  Report given to Florentina Addison, Charity fundraiser.

## 2022-06-04 NOTE — ED Notes (Signed)
Pt needs constant re-direction to stay in his room.  Pt continues to ask for drinks excessively.  Pt told he can only drink water in between meals.

## 2022-06-04 NOTE — ED Notes (Addendum)
Pt given second cup of decaf coffee

## 2022-06-04 NOTE — ED Notes (Signed)
Pt given breakfast tray and decaf coffee. 

## 2022-06-04 NOTE — ED Notes (Signed)
Patient in the restroom.

## 2022-06-04 NOTE — ED Notes (Signed)
Snack and beverage given. 

## 2022-06-04 NOTE — ED Notes (Signed)
VOL / pending TOC placement 

## 2022-06-04 NOTE — ED Notes (Signed)
Offered shower to pt, he declined at this time, said he would take one tomorrow morning.

## 2022-06-05 DIAGNOSIS — R456 Violent behavior: Secondary | ICD-10-CM | POA: Diagnosis not present

## 2022-06-05 LAB — URINALYSIS, COMPLETE (UACMP) WITH MICROSCOPIC
Bacteria, UA: NONE SEEN
Bilirubin Urine: NEGATIVE
Glucose, UA: NEGATIVE mg/dL
Hgb urine dipstick: NEGATIVE
Ketones, ur: NEGATIVE mg/dL
Leukocytes,Ua: NEGATIVE
Nitrite: NEGATIVE
Protein, ur: NEGATIVE mg/dL
Specific Gravity, Urine: 1.008 (ref 1.005–1.030)
Squamous Epithelial / HPF: NONE SEEN (ref 0–5)
WBC, UA: NONE SEEN WBC/hpf (ref 0–5)
pH: 7 (ref 5.0–8.0)

## 2022-06-05 LAB — CBG MONITORING, ED: Glucose-Capillary: 109 mg/dL — ABNORMAL HIGH (ref 70–99)

## 2022-06-05 NOTE — ED Notes (Signed)
Nurse talked to patient's caseworker, [name] and she states that someone would be coming tomorrow to interview him for placement, she did not know the time yet. [name] Engineer, technical sales) states she is really working on getting him a home. Nurse let Patient speak with her and He seemed more at peace after talking to her. Staff will continue to monitor.

## 2022-06-05 NOTE — Progress Notes (Signed)
   06/05/22 1600  Clinical Encounter Type  Visited With Patient  Visit Type Initial;Spiritual support;Social support  Spiritual Encounters  Spiritual Needs Prayer   Chaplain Anthonee Gelin greeted pt and introduced herself. Pt requested prayerand was grateful for the visit. Made plan to check in on 7/11

## 2022-06-05 NOTE — ED Notes (Signed)
Patient is at the door repeatedly and states " I need the Doctor, Doctor Katrinka Blazing already came in to see the Patient , and nurse reminded him about this, and He said " when can I leave, Nurse let him know that caseworker was working on placement for him and He said " Ok" but keeps coming back to door to inquire of same things over and over.Patient is drooling, has dysphagia for unknown origin. Nurse will continue to monitor.

## 2022-06-05 NOTE — ED Provider Notes (Signed)
Emergency Medicine Observation Re-evaluation Note  Ian Medina is a 66 y.o. male, seen on rounds today.  Pt initially presented to the ED for complaints of Psychiatric Evaluation (/) Currently, the patient is denying any acute concerns, requesting a snack.  Physical Exam  BP (!) 158/73 (BP Location: Left Arm)   Pulse 77   Temp 98 F (36.7 C) (Oral)   Resp 16   Ht 6' (1.829 m)   Wt 75.8 kg   SpO2 99%   BMI 22.66 kg/m  Physical Exam General: no acute distress  Psych: calm  ED Course / MDM  EKG:   I have reviewed the labs performed to date as well as medications administered while in observation.  Recent changes in the last 24 hours include UA obtained 2/2 increased urinary frequency but is unremarkable.  Patient states he does not feel needs to go to the bathroom anymore than usual.  I do not believe this requires further emergency evaluation at this time..  Plan  Current plan is for social work dispo.  Ian Medina is not under involuntary commitment.     Gilles Chiquito, MD 06/05/22 [phone]

## 2022-06-05 NOTE — ED Notes (Signed)
Patient got dinner tray and a drink. 

## 2022-06-05 NOTE — ED Notes (Signed)
Patient back and forth to the door asking for items repeatedly, Nurse talk to him about times for meals, and snacks, and He said " ok" Patient is redirectable, and has no aggression noted. Patient does have to void often, UA ordered.

## 2022-06-05 NOTE — TOC Progression Note (Signed)
Transition of Care [facility]) - Progression Note    Patient Details  Name: Ian Medina MRN: [id number] Date of Birth: 23-Apr-1956  [facility] of [facility]) CM/SW Contact  Allayne Butcher, RN Phone Number: 06/05/2022, 8:53 AM  Clinical Narrative:    Called and left a message for [name], Legal Guardian, message left for return call and update on placement.          Expected Discharge Plan and Services                                                 Social Determinants of Health (SDOH) Interventions    Readmission Risk Interventions     No data to display

## 2022-06-05 NOTE — ED Notes (Signed)
Patient let nurse obtain blood sugar, He is cooperative, does ask snacks often, let him know the snack time and he went back to His room. Patient is without signs of distress at this time, will continue to monitor.

## 2022-06-05 NOTE — ED Notes (Signed)
Patient is vol pending placement 

## 2022-06-05 NOTE — ED Notes (Signed)
Pt given snack. 

## 2022-06-05 NOTE — ED Notes (Signed)
Patient is up asking for breakfast, let him know that it should be here soon, no behavioral issues, will continue to monitor.

## 2022-06-05 NOTE — ED Notes (Signed)
Dr. Katrinka Blazing came to visit Patient, Patient was cooperative and calm.

## 2022-06-06 DIAGNOSIS — R456 Violent behavior: Secondary | ICD-10-CM | POA: Diagnosis not present

## 2022-06-06 NOTE — ED Notes (Signed)
Changed patient and bedding. Patient is now watching TV and resting.

## 2022-06-06 NOTE — ED Notes (Signed)
Pt noted to have slight drooling but not to the extent as was reported. Will continue to monitor

## 2022-06-06 NOTE — TOC Progression Note (Signed)
Transition of Care [facility]) - Progression Note    Patient Details  Name: Ian Medina MRN: [id number] Date of Birth: [birth year]  [facility]) CM/SW Contact  Allayne Butcher, RN Phone Number: 06/06/2022, 2:51 PM  Clinical Narrative:    Reached out to [name] with [place] [employer]- she is still looking for placement.  FL2 secure emailed to her so she can give to potential ALF/Family Care Homes.          Expected Discharge Plan and Services                                                 Social Determinants of Health (SDOH) Interventions    Readmission Risk Interventions     No data to display

## 2022-06-06 NOTE — ED Notes (Signed)
Report received from Kimberly S , RN including SBAR. On initial round after report Pt is warm/dry, resting quietly in room without any s/s of distress.  Will continue to monitor throughout shift as ordered for any changes in behaviors and for continued safety.   

## 2022-06-06 NOTE — ED Notes (Signed)
Pt profusely drooling. Called previous group home to determine patient's baseline at [phone], who report that pt usually drools a lot and has to use towels daily. Group home staff report that patient was prescribed cogentin for this, but did not have a change to start it before sent to ED.  EDP [place] notified of patient drooling, no new orders.

## 2022-06-06 NOTE — ED Notes (Signed)
Hospital meal provided.  100% consumed, pt tolerated w/o complaints.  Waste discarded appropriately.   

## 2022-06-06 NOTE — ED Notes (Signed)
Patient went to restroom and had a bunch a toilet paper in toilet and feces on hand. Patient given wash cloth with soap on it to clean hands. Writer removed most of toilet paper from toilet to prevent toilet from clogging. Patient instructed to it was time to go back to bed.

## 2022-06-06 NOTE — ED Notes (Signed)
Patient soiled on self in bathroom. Writer assisted patient in changing under garment and pants.

## 2022-06-06 NOTE — [place] FL2 (Signed)
[place] MEDICAID FL2 LEVEL OF CARE SCREENING TOOL     IDENTIFICATION  Patient Name: Ian Medina Birthdate: 06-16-1956 Sex: male Admission Date (Current Location): 06/02/2022  [place] and [place] Number:  Chiropodist and Address:  [facility], [address], [place], [place] [address]      Provider Number: [id number]  Attending Physician Name and Address:  No att. providers found  Relative Name and Phone Number:  Arvella Merles DSS [phone]    Current Level of Care: Other (Comment) (ED boarder) Recommended Level of Care: Family Care Home, Assisted Living Facility Prior Approval Number:    Date Approved/Denied:   PASRR Number:    Discharge Plan: Other (Comment) (ALF/ Family Care Home)    Current Diagnoses: Patient Active Problem List   Diagnosis Date Noted   Aggressive behavior 06/03/2022   Drug abuse (HCC) 08/23/2016   Dysphagia, unspecified(787.20) 05/11/2014   Hepatitis C antibody test positive 03/12/2013   Encounter for screening colonoscopy 03/12/2013    Orientation RESPIRATION BLADDER Height & Weight     Self, Time, Place, Situation  Normal Continent Weight: 75.8 kg Height:  6' (182.9 cm)  BEHAVIORAL SYMPTOMS/MOOD NEUROLOGICAL BOWEL NUTRITION STATUS      Incontinent (incontenent episodes) Diet (Regular)  AMBULATORY STATUS COMMUNICATION OF NEEDS Skin   Independent Verbally Normal                       Personal Care Assistance Level of Assistance  Bathing, Feeding, Dressing Bathing Assistance: Limited assistance Feeding assistance: Independent Dressing Assistance: Limited assistance     Functional Limitations Info             SPECIAL CARE FACTORS FREQUENCY                       Contractures Contractures Info: Not present    Additional Factors Info  Code Status, Allergies, Psychotropic Code Status Info: Full Allergies Info: Propoxyphene Psychotropic Info: Schizophrenic- meds:  Cogentin, Thorazine, Prozac, Zyprexa, Ingrezza, Valium as needed         Current Medications (06/06/2022):  This is the current hospital active medication list Current Facility-Administered Medications  Medication Dose Route Frequency Provider Last Rate Last Admin   albuterol (VENTOLIN HFA) 108 (90 Base) MCG/ACT inhaler 2 puff  2 puff Inhalation Q6H PRN Ward, Kristen N, DO       aspirin chewable tablet 81 mg  81 mg Oral Daily Ward, Kristen N, DO   81 mg at 06/06/22 0917   benztropine (COGENTIN) tablet 1 mg  1 mg Oral BID Charm Rings, NP   1 mg at 06/06/22 0917   budesonide (PULMICORT) nebulizer solution 0.25 mg  0.25 mg Nebulization BID Ward, Kristen N, DO       chlorproMAZINE (THORAZINE) tablet 50 mg  50 mg Oral Daily Ward, Kristen N, DO   50 mg at 06/06/22 0927   diazepam (VALIUM) tablet 5 mg  5 mg Oral Q6H PRN Ward, Kristen N, DO   5 mg at 06/06/22 0955   FLUoxetine (PROZAC) capsule 20 mg  20 mg Oral Daily Ward, Kristen N, DO   20 mg at 06/06/22 0917   gabapentin (NEURONTIN) capsule 100 mg  100 mg Oral TID Ward, Kristen N, DO   100 mg at 06/06/22 0917   montelukast (SINGULAIR) tablet 10 mg  10 mg Oral QHS Ward, Kristen N, DO   10 mg at 06/05/22 2141   OLANZapine (ZYPREXA) tablet  20 mg  20 mg Oral QHS Ward, Kristen N, DO   20 mg at 06/05/22 2141   pantoprazole (PROTONIX) EC tablet 40 mg  40 mg Oral Daily Ward, Kristen N, DO   40 mg at 06/06/22 0917   traZODone (DESYREL) tablet 50 mg  50 mg Oral QHS Ward, Kristen N, DO   50 mg at 06/05/22 2141   valbenazine (INGREZZA) capsule 40 mg  40 mg Oral Daily Ward, Kristen N, DO   40 mg at 06/06/22 5035   Current Outpatient Medications  Medication Sig Dispense Refill   atropine 1 % ophthalmic solution SMARTSIG:In Eye(s)     budesonide (PULMICORT) 0.25 MG/2ML nebulizer solution Take 2 mLs (0.25 mg total) by nebulization 2 (two) times daily. 60 mL 12   cetirizine (ZYRTEC) 10 MG tablet Take 10 mg by mouth daily.     chlorproMAZINE (THORAZINE) 100  MG tablet Take by mouth.     chlorproMAZINE (THORAZINE) 25 MG tablet Take by mouth daily.     chlorproMAZINE (THORAZINE) 50 MG tablet Take by mouth.     diazepam (VALIUM) 5 MG tablet Take 5 mg by mouth every 6 (six) hours as needed for anxiety.     FLUoxetine (PROZAC) 20 MG capsule Take 20 mg by mouth daily.     gabapentin (NEURONTIN) 100 MG capsule Take 100 mg by mouth 3 (three) times daily.     glycopyrrolate (ROBINUL) 2 MG tablet Take by mouth.     INGREZZA 40 MG capsule Take 40 mg by mouth daily.     ipratropium-albuterol (DUONEB) 0.5-2.5 (3) MG/3ML SOLN Take 3 mLs by nebulization 3 (three) times daily. 360 mL 5   ketoconazole (NIZORAL) 2 % shampoo Apply topically.     lovastatin (MEVACOR) 20 MG tablet Take 20 mg by mouth daily.     montelukast (SINGULAIR) 10 MG tablet Take 10 mg by mouth at bedtime.     OLANZapine (ZYPREXA) 10 MG tablet Take 20 mg by mouth at bedtime.      OLANZapine (ZYPREXA) 20 MG tablet Take 20 mg by mouth at bedtime.     pantoprazole (PROTONIX) 40 MG tablet Take 40 mg by mouth daily.     sucralfate (CARAFATE) 1 GM/10ML suspension SMARTSIG:Milliliter(s) By Mouth     traZODone (DESYREL) 50 MG tablet Take 50 mg by mouth at bedtime.     TRELEGY ELLIPTA 100-62.5-25 MCG/ACT AEPB Inhale 1 puff into the lungs daily.     vitamin B-12 (CYANOCOBALAMIN) 1000 MCG tablet Take 1,000 mcg by mouth daily.     albuterol (VENTOLIN HFA) 108 (90 Base) MCG/ACT inhaler Inhale 2 puffs into the lungs every 6 (six) hours as needed for wheezing or shortness of breath. 8 g 5   aspirin 81 MG chewable tablet 1 tablet     benztropine (COGENTIN) 1 MG tablet Take 1 mg by mouth 2 (two) times daily. (Patient not taking: Reported on 06/03/2022)     fluticasone (FLONASE) 50 MCG/ACT nasal spray Place into both nostrils.     INGREZZA 80 MG capsule Take 80 mg by mouth daily. (Patient not taking: Reported on 06/03/2022)     niacin (NIASPAN) 1000 MG CR tablet Take 1,000 mg by mouth at bedtime.     sucralfate  (CARAFATE) 1 G tablet Take 1 g by mouth 4 (four) times daily. (Patient not taking: Reported on 06/03/2022)       Discharge Medications: Please see discharge summary for a list of discharge medications.  Relevant Imaging Results:  Relevant  Lab Results:   Additional Information SS# [id number]  Allayne Butcher, RN

## 2022-06-06 NOTE — ED Notes (Signed)
Pt came to staff requesting soda.  Staff informed pt that he can have a cup of ice water until breakfast arrives.

## 2022-06-06 NOTE — ED Notes (Signed)
Report received from Ally, RN including SBAR. Patient alert and oriented, warm and dry, and in no acute distress. Patient denies SI, HI, AVH and pain. Patient made aware of Q15 minute rounds and Rover and Officer presence for their safety. Patient instructed to come to this nurse with needs or concerns.  

## 2022-06-06 NOTE — ED Notes (Signed)
Hospital meal provided, pt tolerated w/o complaints.  Waste discarded appropriately.  

## 2022-06-07 DIAGNOSIS — R456 Violent behavior: Secondary | ICD-10-CM | POA: Diagnosis not present

## 2022-06-07 MED ORDER — PANTOPRAZOLE SODIUM 40 MG PO TBEC: 40.0000 mg | DELAYED_RELEASE_TABLET | Freq: Every day | ORAL | 0 refills | Status: DC

## 2022-06-07 MED ORDER — LOVASTATIN 20 MG PO TABS: 20.0000 mg | ORAL_TABLET | Freq: Every day | ORAL | 0 refills | Status: DC

## 2022-06-07 MED ORDER — ALBUTEROL SULFATE HFA 108 (90 BASE) MCG/ACT IN AERS: 2.0000 | INHALATION_SPRAY | Freq: Four times a day (QID) | RESPIRATORY_TRACT | 5 refills | Status: DC | PRN

## 2022-06-07 MED ORDER — BENZTROPINE MESYLATE 1 MG PO TABS: 1.0000 mg | ORAL_TABLET | Freq: Two times a day (BID) | ORAL | 0 refills | Status: DC

## 2022-06-07 MED ORDER — TRELEGY ELLIPTA 100-62.5-25 MCG/ACT IN AEPB: 1.0000 | INHALATION_SPRAY | Freq: Every day | RESPIRATORY_TRACT | 0 refills | Status: AC

## 2022-06-07 MED ORDER — CHLORPROMAZINE HCL 50 MG PO TABS: 50.0000 mg | ORAL_TABLET | Freq: Every day | ORAL | 0 refills | Status: DC

## 2022-06-07 MED ORDER — CETIRIZINE HCL 10 MG PO TABS: 10.0000 mg | ORAL_TABLET | Freq: Every day | ORAL | 0 refills | Status: DC

## 2022-06-07 MED ORDER — ASPIRIN 81 MG PO CHEW: CHEWABLE_TABLET | ORAL | 0 refills | Status: AC

## 2022-06-07 MED ORDER — FLUOXETINE HCL 20 MG PO CAPS: 20.0000 mg | ORAL_CAPSULE | Freq: Every day | ORAL | 0 refills | Status: AC

## 2022-06-07 MED ORDER — OLANZAPINE 20 MG PO TABS: 20.0000 mg | ORAL_TABLET | Freq: Every day | ORAL | 0 refills | Status: DC

## 2022-06-07 MED ORDER — BUDESONIDE 0.25 MG/2ML IN SUSP: 0.2500 mg | Freq: Two times a day (BID) | RESPIRATORY_TRACT | 12 refills | Status: DC

## 2022-06-07 MED ORDER — VALBENAZINE TOSYLATE 40 MG PO CAPS: 40.0000 mg | ORAL_CAPSULE | Freq: Every day | ORAL | 0 refills | Status: AC

## 2022-06-07 MED ORDER — DIAZEPAM 5 MG PO TABS: 5.0000 mg | ORAL_TABLET | Freq: Four times a day (QID) | ORAL | 0 refills | Status: AC | PRN

## 2022-06-07 MED ORDER — TRAZODONE HCL 50 MG PO TABS: 50.0000 mg | ORAL_TABLET | Freq: Every day | ORAL | 0 refills | Status: AC

## 2022-06-07 MED ORDER — MONTELUKAST SODIUM 10 MG PO TABS: 10.0000 mg | ORAL_TABLET | Freq: Every day | ORAL | 0 refills | Status: AC

## 2022-06-07 NOTE — ED Provider Notes (Signed)
Patient cleared to go back to group home by Child psychotherapist.  Chronic medications refilled.  Discharged in stable condition.   Gilles Chiquito, MD 06/07/22 [phone]

## 2022-06-07 NOTE — ED Notes (Signed)
Patient dries at this time. 

## 2022-06-07 NOTE — TOC Progression Note (Signed)
Transition of Care [facility]) - Progression Note    Patient Details  Name: Ian Medina MRN: [id number] Date of Birth: 01-15-1956  Transition of Care [facility]) CM/SW Contact  Allayne Butcher, RN Phone Number: 06/07/2022, 9:20 AM  Clinical Narrative:    [name] with [facility] called last night and reports that she has found a family care home for the patient in [place].  Family Care home will be picking the patient up today, unsure of the time.        Expected Discharge Plan and Services                                                 Social Determinants of Health (SDOH) Interventions    Readmission Risk Interventions     No data to display

## 2022-06-07 NOTE — ED Notes (Signed)
Refused shower. ED tech provided new brief and linens

## 2022-06-07 NOTE — ED Notes (Addendum)
Contacted legal guardian, [name] at  [employer]     [phone]     In regards to pending disposition and ETA on when family care home coming to get patient. Per RN Case Manager, [facility] was to pick [facility] patient today. Left HIPPA compliant message.

## 2022-06-07 NOTE — ED Notes (Signed)
Pt awake again and refuses to use urinal. Pt persistent to go to restroom. Pt again urinates in floor. Majority of two voids onto floor again.

## 2022-06-07 NOTE — ED Notes (Signed)
Hospital meal provided.  100% consumed, pt tolerated w/o complaints.  Waste discarded appropriately.   

## 2022-06-07 NOTE — TOC Progression Note (Signed)
Transition of Care [facility]) - Progression Note    Patient Details  Name: Ian Medina MRN: [id number] Date of Birth: 07/13/1956  [facility]) CM/SW Contact  Allayne Butcher, RN Phone Number: 06/07/2022, 4:38 PM  Clinical Narrative:     RNCM spoke with guardian, [name], she reports that the patient is going to [facility]- they will be coming to pick the patient up soon.         Expected Discharge Plan and Services                                                 Social Determinants of Health (SDOH) Interventions    Readmission Risk Interventions     No data to display

## 2022-06-07 NOTE — ED Notes (Signed)
Breakfast given.  

## 2022-06-07 NOTE — ED Notes (Signed)
Legal guardian, [name] called back and stated that she spoke with her supervisor who confirmed that the family care home is on their way now and patient will be picked up tonight.

## 2022-06-07 NOTE — ED Notes (Signed)
Pt has been to restroom twice in the last 30 minutes. Both times pt urinated all over toilet, floor, and wall. Cleaned by nurse staff and EVS. Pt instructed to use urinal next time he needs to urinate. Pt continually requests staff to do tasks that he is easily do such as pul blanket up over him that are at his waist. Pt educated on independence of unit and encouraged to care for self. Pt does so well, continues to drool

## 2022-06-07 NOTE — ED Notes (Signed)
Patient dried at this time 

## 2022-06-07 NOTE — ED Notes (Signed)
Pt dispo signature placed in medical record bin

## 2022-09-24 ENCOUNTER — Other Ambulatory Visit: Payer: Self-pay

## 2022-09-24 ENCOUNTER — Inpatient Hospital Stay
Admission: EM | Admit: 2022-09-24 | Discharge: 2022-09-28 | DRG: 871 | Disposition: A | Discharge status: Skilled Nursing Facility | Payer: 59 | Attending: Internal Medicine | Admitting: Internal Medicine

## 2022-09-24 ENCOUNTER — Inpatient Hospital Stay: Payer: 59

## 2022-09-24 ENCOUNTER — Emergency Department: Payer: 59

## 2022-09-24 DIAGNOSIS — W19XXXA Unspecified fall, initial encounter: Secondary | ICD-10-CM | POA: Diagnosis present

## 2022-09-24 DIAGNOSIS — F411 Generalized anxiety disorder: Secondary | ICD-10-CM | POA: Diagnosis present

## 2022-09-24 DIAGNOSIS — I10 Essential (primary) hypertension: Secondary | ICD-10-CM | POA: Diagnosis present

## 2022-09-24 DIAGNOSIS — Z7951 Long term (current) use of inhaled steroids: Secondary | ICD-10-CM | POA: Diagnosis not present

## 2022-09-24 DIAGNOSIS — R4189 Other symptoms and signs involving cognitive functions and awareness: Secondary | ICD-10-CM | POA: Diagnosis present

## 2022-09-24 DIAGNOSIS — J440 Chronic obstructive pulmonary disease with acute lower respiratory infection: Secondary | ICD-10-CM | POA: Diagnosis present

## 2022-09-24 DIAGNOSIS — J439 Emphysema, unspecified: Secondary | ICD-10-CM | POA: Diagnosis present

## 2022-09-24 DIAGNOSIS — Z825 Family history of asthma and other chronic lower respiratory diseases: Secondary | ICD-10-CM

## 2022-09-24 DIAGNOSIS — Z20822 Contact with and (suspected) exposure to covid-19: Secondary | ICD-10-CM | POA: Diagnosis present

## 2022-09-24 DIAGNOSIS — F251 Schizoaffective disorder, depressive type: Secondary | ICD-10-CM | POA: Diagnosis present

## 2022-09-24 DIAGNOSIS — K219 Gastro-esophageal reflux disease without esophagitis: Secondary | ICD-10-CM | POA: Diagnosis present

## 2022-09-24 DIAGNOSIS — R296 Repeated falls: Secondary | ICD-10-CM | POA: Diagnosis present

## 2022-09-24 DIAGNOSIS — G9341 Metabolic encephalopathy: Secondary | ICD-10-CM | POA: Diagnosis present

## 2022-09-24 DIAGNOSIS — R410 Disorientation, unspecified: Secondary | ICD-10-CM | POA: Diagnosis present

## 2022-09-24 DIAGNOSIS — E7800 Pure hypercholesterolemia, unspecified: Secondary | ICD-10-CM | POA: Diagnosis present

## 2022-09-24 DIAGNOSIS — F17210 Nicotine dependence, cigarettes, uncomplicated: Secondary | ICD-10-CM | POA: Diagnosis present

## 2022-09-24 DIAGNOSIS — A419 Sepsis, unspecified organism: Secondary | ICD-10-CM | POA: Diagnosis present

## 2022-09-24 DIAGNOSIS — Z515 Encounter for palliative care: Secondary | ICD-10-CM | POA: Diagnosis not present

## 2022-09-24 DIAGNOSIS — I959 Hypotension, unspecified: Secondary | ICD-10-CM

## 2022-09-24 DIAGNOSIS — R6520 Severe sepsis without septic shock: Secondary | ICD-10-CM | POA: Diagnosis present

## 2022-09-24 DIAGNOSIS — F70 Mild intellectual disabilities: Secondary | ICD-10-CM | POA: Diagnosis present

## 2022-09-24 DIAGNOSIS — Z888 Allergy status to other drugs, medicaments and biological substances status: Secondary | ICD-10-CM | POA: Diagnosis not present

## 2022-09-24 DIAGNOSIS — J189 Pneumonia, unspecified organism: Secondary | ICD-10-CM | POA: Diagnosis present

## 2022-09-24 DIAGNOSIS — R1310 Dysphagia, unspecified: Secondary | ICD-10-CM | POA: Diagnosis present

## 2022-09-24 DIAGNOSIS — R1313 Dysphagia, pharyngeal phase: Secondary | ICD-10-CM | POA: Diagnosis not present

## 2022-09-24 DIAGNOSIS — S2243XA Multiple fractures of ribs, bilateral, initial encounter for closed fracture: Secondary | ICD-10-CM | POA: Diagnosis present

## 2022-09-24 DIAGNOSIS — Z79899 Other long term (current) drug therapy: Secondary | ICD-10-CM | POA: Diagnosis not present

## 2022-09-24 DIAGNOSIS — S2249XA Multiple fractures of ribs, unspecified side, initial encounter for closed fracture: Secondary | ICD-10-CM | POA: Diagnosis present

## 2022-09-24 DIAGNOSIS — W19XXXD Unspecified fall, subsequent encounter: Secondary | ICD-10-CM | POA: Diagnosis not present

## 2022-09-24 LAB — RESPIRATORY PANEL BY PCR

## 2022-09-24 LAB — COMPREHENSIVE METABOLIC PANEL
ALT: 14 U/L (ref 0–44)
AST: 35 U/L (ref 15–41)
Albumin: 3.3 g/dL — ABNORMAL LOW (ref 3.5–5.0)
Alkaline Phosphatase: 73 U/L (ref 38–126)
Anion gap: 15 (ref 5–15)
BUN: 11 mg/dL (ref 8–23)
CO2: 22 mmol/L (ref 22–32)
Calcium: 8.9 mg/dL (ref 8.9–10.3)
Chloride: 99 mmol/L (ref 98–111)
Creatinine, Ser: 0.95 mg/dL (ref 0.61–1.24)
GFR, Estimated: >60 mL/min (ref >60)
Glucose, Bld: 103 mg/dL — ABNORMAL HIGH (ref 70–99)
Potassium: 3.6 mmol/L (ref 3.5–5.1)
Sodium: 136 mmol/L (ref 135–145)
Total Bilirubin: 1.1 mg/dL (ref 0.3–1.2)
Total Protein: 7.7 g/dL (ref 6.5–8.1)

## 2022-09-24 LAB — URINALYSIS, COMPLETE (UACMP) WITH MICROSCOPIC
Bilirubin Urine: NEGATIVE
Glucose, UA: NEGATIVE mg/dL
Hgb urine dipstick: NEGATIVE
Ketones, ur: NEGATIVE mg/dL
Leukocytes,Ua: NEGATIVE
Nitrite: NEGATIVE
Protein, ur: 30 mg/dL — AB
Specific Gravity, Urine: 1.017 (ref 1.005–1.030)
Squamous Epithelial / HPF: NONE SEEN (ref 0–5)
pH: 5 (ref 5.0–8.0)

## 2022-09-24 LAB — CBC WITH DIFFERENTIAL/PLATELET
Abs Immature Granulocytes: 0.05 10*3/uL (ref 0.00–0.07)
Basophils Absolute: 0 10*3/uL (ref 0.0–0.1)
Basophils Relative: 0 %
Eosinophils Absolute: 0 10*3/uL (ref 0.0–0.5)
Eosinophils Relative: 0 %
HCT: 42.9 % (ref 39.0–52.0)
Hemoglobin: 13.7 g/dL (ref 13.0–17.0)
Immature Granulocytes: 1 %
Lymphocytes Relative: 32 %
Lymphs Abs: 3.2 10*3/uL (ref 0.7–4.0)
MCH: 30.1 pg (ref 26.0–34.0)
MCHC: 31.9 g/dL (ref 30.0–36.0)
MCV: 94.3 fL (ref 80.0–100.0)
Monocytes Absolute: 0.5 10*3/uL (ref 0.1–1.0)
Monocytes Relative: 5 %
Neutro Abs: 6.2 10*3/uL (ref 1.7–7.7)
Neutrophils Relative %: 62 %
Platelets: 279 10*3/uL (ref 150–400)
RBC: 4.55 MIL/uL (ref 4.22–5.81)
RDW: 15.3 % (ref 11.5–15.5)
WBC: 10 10*3/uL (ref 4.0–10.5)
nRBC: 0 % (ref 0.0–0.2)

## 2022-09-24 LAB — PROTIME-INR
INR: 1.2 (ref 0.8–1.2)
Prothrombin Time: 15.2 seconds (ref 11.4–15.2)

## 2022-09-24 LAB — RESP PANEL BY RT-PCR (FLU A&B, COVID) ARPGX2
Influenza A by PCR: NEGATIVE
Influenza B by PCR: NEGATIVE
SARS Coronavirus 2 by RT PCR: NEGATIVE

## 2022-09-24 LAB — LACTIC ACID, PLASMA
Lactic Acid, Venous: 2.2 mmol/L (ref 0.5–1.9)
Lactic Acid, Venous: 5.4 mmol/L (ref 0.5–1.9)
Lactic Acid, Venous: 1.9 mmol/L (ref 0.5–1.9)

## 2022-09-24 LAB — APTT: aPTT: 26 seconds (ref 24–36)

## 2022-09-24 MED ORDER — FLUOXETINE HCL 20 MG PO CAPS
20.0000 mg | ORAL_CAPSULE | Freq: Every day | ORAL | Status: DC
  Administered 2022-09-25 – 2022-09-28 (×4): 20 mg via ORAL
  Filled 2022-09-24 (×4): qty 1

## 2022-09-24 MED ORDER — LACTATED RINGERS IV BOLUS (SEPSIS)
1000.0000 mL | Freq: Once | INTRAVENOUS | Status: AC
  Administered 2022-09-24: 1000 mL via INTRAVENOUS

## 2022-09-24 MED ORDER — VITAMIN B-12 1000 MCG PO TABS
1000.0000 ug | ORAL_TABLET | Freq: Every day | ORAL | Status: DC
  Administered 2022-09-24 – 2022-09-28 (×5): 1000 ug via ORAL
  Filled 2022-09-24: qty 1
  Filled 2022-09-24: qty 2
  Filled 2022-09-24: qty 1
  Filled 2022-09-24: qty 2
  Filled 2022-09-24: qty 1

## 2022-09-24 MED ORDER — GLYCOPYRROLATE 1 MG PO TABS
2.0000 mg | ORAL_TABLET | Freq: Two times a day (BID) | ORAL | Status: DC
  Administered 2022-09-24 – 2022-09-26 (×4): 2 mg via ORAL
  Filled 2022-09-24 (×6): qty 2

## 2022-09-24 MED ORDER — ACETAMINOPHEN 325 MG PO TABS: 650.0000 mg | ORAL_TABLET | Freq: Four times a day (QID) | ORAL | Status: DC | PRN

## 2022-09-24 MED ORDER — PANTOPRAZOLE SODIUM 40 MG PO TBEC
40.0000 mg | DELAYED_RELEASE_TABLET | Freq: Every day | ORAL | Status: DC
  Administered 2022-09-25 – 2022-09-28 (×4): 40 mg via ORAL
  Filled 2022-09-24 (×4): qty 1

## 2022-09-24 MED ORDER — MONTELUKAST SODIUM 10 MG PO TABS
10.0000 mg | ORAL_TABLET | Freq: Every day | ORAL | Status: DC
  Administered 2022-09-24 – 2022-09-27 (×3): 10 mg via ORAL
  Filled 2022-09-24 (×3): qty 1

## 2022-09-24 MED ORDER — LACTATED RINGERS IV BOLUS (SEPSIS)
250.0000 mL | Freq: Once | INTRAVENOUS | Status: AC
  Administered 2022-09-24: 250 mL via INTRAVENOUS

## 2022-09-24 MED ORDER — IPRATROPIUM-ALBUTEROL 0.5-2.5 (3) MG/3ML IN SOLN
3.0000 mL | Freq: Three times a day (TID) | RESPIRATORY_TRACT | Status: DC
  Administered 2022-09-24 – 2022-09-25 (×3): 3 mL via RESPIRATORY_TRACT
  Filled 2022-09-24 (×4): qty 3

## 2022-09-24 MED ORDER — SODIUM CHLORIDE 0.9 % IV SOLN
1.0000 g | Freq: Once | INTRAVENOUS | Status: AC
  Administered 2022-09-24: 1 g via INTRAVENOUS
  Filled 2022-09-24: qty 10

## 2022-09-24 MED ORDER — LIDOCAINE 5 % EX PTCH
1.0000 | MEDICATED_PATCH | CUTANEOUS | Status: DC
  Administered 2022-09-24 – 2022-09-27 (×3): 1 via TRANSDERMAL
  Filled 2022-09-24 (×2): qty 1

## 2022-09-24 MED ORDER — BUDESONIDE 0.25 MG/2ML IN SUSP
0.2500 mg | Freq: Two times a day (BID) | RESPIRATORY_TRACT | Status: DC
  Administered 2022-09-24 – 2022-09-25 (×3): 0.25 mg via RESPIRATORY_TRACT
  Filled 2022-09-24 (×3): qty 2

## 2022-09-24 MED ORDER — PRAVASTATIN SODIUM 20 MG PO TABS
20.0000 mg | ORAL_TABLET | Freq: Every day | ORAL | Status: DC
  Administered 2022-09-24 – 2022-09-27 (×3): 20 mg via ORAL
  Filled 2022-09-24 (×4): qty 1

## 2022-09-24 MED ORDER — LACTATED RINGERS IV SOLN: INTRAVENOUS | Status: AC

## 2022-09-24 MED ORDER — FLUTICASONE PROPIONATE 50 MCG/ACT NA SUSP: 1.0000 | Freq: Every day | NASAL | Status: DC | PRN

## 2022-09-24 MED ORDER — BENZTROPINE MESYLATE 0.5 MG PO TABS
1.0000 mg | ORAL_TABLET | Freq: Three times a day (TID) | ORAL | Status: DC
  Administered 2022-09-24 – 2022-09-26 (×6): 1 mg via ORAL
  Filled 2022-09-24: qty 2
  Filled 2022-09-24 (×2): qty 1
  Filled 2022-09-24: qty 2
  Filled 2022-09-24: qty 1
  Filled 2022-09-24 (×2): qty 2

## 2022-09-24 MED ORDER — SODIUM CHLORIDE 0.9 % IV SOLN
2.0000 g | INTRAVENOUS | Status: AC
  Administered 2022-09-25 – 2022-09-28 (×4): 2 g via INTRAVENOUS
  Filled 2022-09-24 (×2): qty 2
  Filled 2022-09-24 (×2): qty 20

## 2022-09-24 MED ORDER — OLANZAPINE 10 MG PO TABS
20.0000 mg | ORAL_TABLET | Freq: Every day | ORAL | Status: DC
  Administered 2022-09-24 – 2022-09-27 (×3): 20 mg via ORAL
  Filled 2022-09-24 (×4): qty 2

## 2022-09-24 MED ORDER — ACETAMINOPHEN 650 MG RE SUPP: 650.0000 mg | Freq: Four times a day (QID) | RECTAL | Status: DC | PRN

## 2022-09-24 MED ORDER — SODIUM CHLORIDE 0.9 % IV SOLN
500.0000 mg | INTRAVENOUS | Status: DC
  Administered 2022-09-25 – 2022-09-26 (×2): 500 mg via INTRAVENOUS
  Filled 2022-09-24: qty 5
  Filled 2022-09-24: qty 500
  Filled 2022-09-24: qty 5

## 2022-09-24 MED ORDER — NIACIN ER (ANTIHYPERLIPIDEMIC) 1000 MG PO TBCR: 1000.0000 mg | EXTENDED_RELEASE_TABLET | Freq: Every day | ORAL | Status: DC

## 2022-09-24 MED ORDER — SODIUM CHLORIDE 0.9 % IV SOLN
500.0000 mg | Freq: Once | INTRAVENOUS | Status: AC
  Administered 2022-09-24: 500 mg via INTRAVENOUS
  Filled 2022-09-24: qty 5

## 2022-09-24 MED ORDER — ONDANSETRON HCL 4 MG PO TABS: 4.0000 mg | ORAL_TABLET | Freq: Four times a day (QID) | ORAL | Status: DC | PRN

## 2022-09-24 MED ORDER — ENOXAPARIN SODIUM 40 MG/0.4ML IJ SOSY
40.0000 mg | PREFILLED_SYRINGE | INTRAMUSCULAR | Status: DC
  Administered 2022-09-24 – 2022-09-27 (×4): 40 mg via SUBCUTANEOUS
  Filled 2022-09-24 (×4): qty 0.4

## 2022-09-24 MED ORDER — CHLORPROMAZINE HCL 50 MG PO TABS
50.0000 mg | ORAL_TABLET | Freq: Every day | ORAL | Status: DC
  Administered 2022-09-24 – 2022-09-27 (×3): 50 mg via ORAL
  Filled 2022-09-24 (×4): qty 1

## 2022-09-24 MED ORDER — TRAZODONE HCL 50 MG PO TABS
50.0000 mg | ORAL_TABLET | Freq: Every day | ORAL | Status: DC
  Administered 2022-09-24 – 2022-09-27 (×3): 50 mg via ORAL
  Filled 2022-09-24 (×3): qty 1

## 2022-09-24 MED ORDER — LACTATED RINGERS IV SOLN: INTRAVENOUS | Status: DC

## 2022-09-24 MED ORDER — ONDANSETRON HCL 4 MG/2ML IJ SOLN: 4.0000 mg | Freq: Four times a day (QID) | INTRAMUSCULAR | Status: DC | PRN

## 2022-09-24 NOTE — Assessment & Plan Note (Signed)
Continue Thorazine, trazodone and Prozac. 

## 2022-09-24 NOTE — H&P (Signed)
History and Physical    Patient: Ian Medina ZOX:[id number] DOB: 04/23/1956 DOA: 09/24/2022 DOS: the patient was seen and examined on 09/24/2022 PCP: Duffy Rhody, FNP  Patient coming from: Home  Chief Complaint:  Chief Complaint  Patient presents with   Altered Mental Status   Fall    Most of the history obtained from the EMR as patient is a poor historian. HPI: Ian Medina is a 66 y.o. male with medical history significant for mild intellectual disability, schizoaffective disorder, depressive type, GERD, generalized anxiety disorder, COPD who resides in an adult group home and was brought into the ER by EMS for evaluation. EMS was called because according to the facility staff patient has not been himself and has been having falls.  Not eating or drinking like he usually would. Patient is seen and examined in the emergency room and is awake and alert.  He denies having any complaints and states that he did have some shortness of breath. He is noted to be tachypneic with respiratory rate in the 30s and upon arrival to the ER was tachycardic with systolic blood pressure in the 80s. EMS administered 500 cc of normal saline. Labs done in the ER was significant for lactic acid of 5.4 Chest x-ray showed scattered acute bilateral patchy and interstitial pulmonary opacity suspicious for bilateral Pneumonia. No pleural effusion. Patient received sepsis fluid bolus and Rocephin and Zithromax IV. He will be admitted to the hospital for further evaluation    Review of Systems: unable to review all systems due to the inability of the patient to answer questions. Past Medical History:  Diagnosis Date   Auditory hallucination    COPD with emphysema (HCC)    Drug abuse (HCC)    GAD (generalized anxiety disorder)    GERD (gastroesophageal reflux disease)    Hepatitis C    Hypercholesterolemia    Hypertension    Middle cerebral artery aneurysm    Mild intellectual disabilities     Paranoia (HCC)    Pneumonia 2017   Psoriasis    Schizoaffective disorder, depressive type (HCC)    Schizophrenia (HCC)    diagnosed at age 57, treated by ACT team   Past Surgical History:  Procedure Laterality Date   CHEST TUBE INSERTION     COLONOSCOPY  Nov 2011   Dr. Samuella Cota: normal, internal hemorrhoids   COLONOSCOPY  Sept 2009   small internal hemorrhoids, normal    ESOPHAGOGASTRODUODENOSCOPY  June 2010   Dr. Samuella Cota: normal   ESOPHAGOGASTRODUODENOSCOPY  Jan 2009   Dr. Allena Katz: linear esophagitis, mild stricture, diffuse gastritis and mild duodenitis, PATH: chronic gastritis, negative H.pylori    FOOT SURGERY     right   Social History:  reports that he has been smoking cigarettes. He has been smoking an average of 1 pack per day. He has never used smokeless tobacco. He reports that he does not drink alcohol and does not use drugs.  Allergies  Allergen Reactions   Propoxyphene Rash    Family History  Problem Relation Age of Onset   COPD Father    Colon cancer Neg Hx     Prior to Admission medications   Medication Sig Start Date End Date Taking? Authorizing Provider  albuterol (VENTOLIN HFA) 108 (90 Base) MCG/ACT inhaler Inhale 2 puffs into the lungs every 6 (six) hours as needed for wheezing or shortness of breath. 06/07/22   Gilles Chiquito, MD  atropine 1 % ophthalmic solution SMARTSIG:In Eye(s) 05/10/22   [provider]  benztropine (COGENTIN) 1 MG tablet Take 1 tablet (1 mg total) by mouth 2 (two) times daily. 06/07/22 07/07/22  Lucrezia Starch, MD  budesonide (PULMICORT) 0.25 MG/2ML nebulizer solution Take 2 mLs (0.25 mg total) by nebulization 2 (two) times daily. 06/07/22   Lucrezia Starch, MD  cetirizine (ZYRTEC) 10 MG tablet Take 1 tablet (10 mg total) by mouth daily. 06/07/22 07/07/22  Lucrezia Starch, MD  chlorproMAZINE (THORAZINE) 50 MG tablet Take 1 tablet (50 mg total) by mouth daily. 06/07/22 07/07/22  Lucrezia Starch, MD  FLUoxetine (PROZAC) 20 MG  capsule Take 1 capsule (20 mg total) by mouth daily. 06/07/22 07/07/22  Lucrezia Starch, MD  fluticasone (FLONASE) 50 MCG/ACT nasal spray Place into both nostrils. 01/12/22   [provider]  gabapentin (NEURONTIN) 100 MG capsule Take 100 mg by mouth 3 (three) times daily. 01/25/22   [provider]  glycopyrrolate (ROBINUL) 2 MG tablet Take by mouth. 05/11/22   [provider]  ipratropium-albuterol (DUONEB) 0.5-2.5 (3) MG/3ML SOLN Take 3 mLs by nebulization 3 (three) times daily. 05/02/22   Chesley Mires, MD  ketoconazole (NIZORAL) 2 % shampoo Apply topically. 04/01/22   [provider]  lovastatin (MEVACOR) 20 MG tablet Take 1 tablet (20 mg total) by mouth daily. 06/07/22 07/07/22  Lucrezia Starch, MD  montelukast (SINGULAIR) 10 MG tablet Take 1 tablet (10 mg total) by mouth at bedtime. 06/07/22 07/07/22  Lucrezia Starch, MD  niacin (NIASPAN) 1000 MG CR tablet Take 1,000 mg by mouth at bedtime.    [provider]  OLANZapine (ZYPREXA) 20 MG tablet Take 1 tablet (20 mg total) by mouth at bedtime. 06/07/22 07/07/22  Lucrezia Starch, MD  pantoprazole (PROTONIX) 40 MG tablet Take 1 tablet (40 mg total) by mouth daily. 06/07/22 07/07/22  Lucrezia Starch, MD  sucralfate (CARAFATE) 1 G tablet Take 1 g by mouth 4 (four) times daily. Patient not taking: Reported on 06/03/2022    [provider]  sucralfate (CARAFATE) 1 GM/10ML suspension SMARTSIG:Milliliter(s) By Mouth 04/24/22   [provider]  traZODone (DESYREL) 50 MG tablet Take 1 tablet (50 mg total) by mouth at bedtime. 06/07/22 07/07/22  Lucrezia Starch, MD  vitamin B-12 (CYANOCOBALAMIN) 1000 MCG tablet Take 1,000 mcg by mouth daily.    [provider]    Physical Exam: Vitals:   09/24/22 1200 09/24/22 1230 09/24/22 1315 09/24/22 1330  BP: 106/79 106/75  (!) 109/59  Pulse: 89 90 89 74  Resp:  (!) 21 (!) 25 (!) 27  Temp:      TempSrc:      SpO2: 99% 100% 99% 98%  Weight:      Height:        Physical Exam Vitals and nursing note reviewed.  Constitutional:      Comments: Disheveled  HENT:     Head: Normocephalic and atraumatic.     Nose: Nose normal.     Mouth/Throat:     Mouth: Mucous membranes are moist.  Eyes:     Conjunctiva/sclera: Conjunctivae normal.  Cardiovascular:     Rate and Rhythm: Normal rate and regular rhythm.  Pulmonary:     Breath sounds: Rhonchi present.     Comments: Scattered rhonchi Abdominal:     General: Abdomen is flat. Bowel sounds are normal.     Palpations: Abdomen is soft.  Musculoskeletal:        General: Normal range of motion.     Cervical back: Normal  range of motion and neck supple.  Skin:    General: Skin is warm and dry.  Neurological:     Comments: Moves all extremities  Psychiatric:        Mood and Affect: Mood normal.        Behavior: Behavior normal.     Comments: Impulsive     Data Reviewed: Relevant notes from primary care and specialist visits, past discharge summaries as available in EHR, including Care Everywhere. Prior diagnostic testing as pertinent to current admission diagnoses Updated medications and problem lists for reconciliation ED course, including vitals, labs, imaging, treatment and response to treatment Triage notes, nursing and pharmacy notes and ED provider's notes Notable results as noted in HPI Labs reviewed.  Lactic acid 5.4, sodium 136, potassium 3.6, chloride 99, bicarb 22, glucose 103, BUN 11, creatinine 0.95, calcium 8.9, total protein 7.7, albumin 3.3, AST 35, ALT 14, alkaline phosphatase 73, total bilirubin 1.1, white count 10.0, hemoglobin 13.7, hematocrit 42.9, platelet count 279 Respiratory viral panel negative CT chest without contrast shows probable multilobar bilateral bronchopneumonia, likely from atypical infection. This is apparently acute given the change between the September 24, 2022 chest x-ray and the prior chest x-ray from 02/26/2022. In addition to multiple old healed and  healing bilateral rib fractures there are acute mildly displaced fractures of the posterolateral left ninth and tenth ribs. No pneumothorax at this time. Diffuse bronchial wall thickening with moderate centrilobular and paraseptal emphysema; imaging findings suggestive of underlying COPD. Aortic atherosclerosis, in addition to 2 vessel coronary artery disease. Twelve-lead EKG reviewed by me shows sinus rhythm.  Left atrial enlargement. There are no new results to review at this time.  Assessment and Plan: * Sepsis due to pneumonia (HCC) As evidenced by tachycardia, tachypnea, hypotension that responded to IV fluid resuscitation. Labs show markedly elevated lactic acid levels Chest x-ray shows findings suggestive of multifocal pneumonia Continue aggressive IV fluid resuscitation Trend lactic acid levels Treat patient empirically with Rocephin and Zithromax Speech therapy consult for swallow function evaluation   Multiple rib fractures Status post falls with several healing and new acute rib fractures. Pain control Incentive spirometry while awake  Falls We will place patient on fall precautions PT evaluation once stable  Schizoaffective disorder, depressive type (HCC) Continue Thorazine, trazodone and Prozac  Dysphagia Speech therapy consult for swallow function evaluation      Advance Care Planning:   Code Status: Full Code   Consults: Speech therapy  Family Communication: Called and spoke to patient's legal guardian [name].  All questions and concerns have been addressed.  She verbalizes understanding and agrees with the plan.  CODE STATUS was discussed and patient is a full code.  Severity of Illness: The appropriate patient status for this patient is INPATIENT. Inpatient status is judged to be reasonable and necessary in order to provide the required intensity of service to ensure the patient's safety. The patient's presenting symptoms, physical exam findings, and  initial radiographic and laboratory data in the context of their chronic comorbidities is felt to place them at high risk for further clinical deterioration. Furthermore, it is not anticipated that the patient will be medically stable for discharge from the hospital within 2 midnights of admission.   * I certify that at the point of admission it is my clinical judgment that the patient will require inpatient hospital care spanning beyond 2 midnights from the point of admission due to high intensity of service, high risk for further deterioration and high frequency  of surveillance required.*  Author: Lucile Shutters, MD 09/24/2022 2:43 PM  For on call review [email or web address].

## 2022-09-24 NOTE — ED Notes (Signed)
Pt to CT

## 2022-09-24 NOTE — Assessment & Plan Note (Addendum)
Status post falls with several healing and new acute rib fractures. Pain is well controlled on current regimen. Incentive spirometry while awake 

## 2022-09-24 NOTE — ED Provider Notes (Signed)
[facility] Provider Note    Event Date/Time   First MD Initiated Contact with Patient 09/24/22 1109     (approximate)   History   Altered Mental Status and Fall   HPI  Ian Medina is a 66 y.o. male with past medical history significant for intellectual disability at a group home, who presents to the emergency department with low blood pressure.  Group home stated today that he would almost pass out every time he went to stand up.  Patient without any complaints.  When EMS arrived patient's blood pressure was 80 systolic.  Also found to be in respiratory distress and significantly tachypneic up to the 50s.  No recent antibiotic use or falls.  Received 300 cc bolus prior to arrival.      Physical Exam   Triage Vital Signs: ED Triage Vitals [09/24/22 1106]  Enc Vitals Group     BP      Pulse      Resp      Temp      Temp src      SpO2      Weight 150 lb (68 kg)     Height 5\' 7"  (1.702 m)     Head Circumference      Peak Flow      Pain Score 0     Pain Loc      Pain Edu?      Excl. in [place]?     Most recent vital signs: Vitals:   09/24/22 1430 09/24/22 1500  BP: 109/67 119/65  Pulse: 69 72  Resp: (!) 21 (!) 29  Temp:    SpO2: 100% 100%    Physical Exam Constitutional:      Appearance: He is well-developed.  HENT:     Head: Atraumatic.  Eyes:     Conjunctiva/sclera: Conjunctivae normal.  Cardiovascular:     Rate and Rhythm: Tachycardia present.  Pulmonary:     Effort: No respiratory distress.  Abdominal:     Tenderness: There is no abdominal tenderness.  Musculoskeletal:        General: No swelling or tenderness.     Cervical back: Normal range of motion.  Skin:    General: Skin is warm.     Capillary Refill: Capillary refill takes less than 2 seconds.  Neurological:     Mental Status: He is alert. Mental status is at baseline.  Psychiatric:        Mood and Affect: Mood normal.          IMPRESSION / MDM /  [place] / ED COURSE  I reviewed the triage vital signs and the nursing notes.  On arrival to the emergency department patient with hypotension, tachycardia, concern for sepsis.  Blood and urine cultures obtained.  Patient started on 30 cc/kg of IV fluids.  Coarse breath sounds with focal findings on his lung exam concerning for community-acquired pneumonia.  Started on antibiotic for CAP coverage.  Differential diagnosis including UTI, COVID, ACS    EKG  My interpretation of the EKG -normal sinus rhythm.  Normal intervals.  No chamber enlargement.  No significant ST elevation or depression.  No signs of acute ischemia or dysrhythmia.  No tachycardic or bradycardic dysrhythmias while on cardiac telemetry.  Sinus tachycardia.  RADIOLOGY I independently reviewed imaging, my interpretation of imaging: Chest x-ray with focal findings concerning for pneumonia in the right lower lung.  Read as bilateral pneumonia.  No effusion.  ED Results / Procedures / Treatments   Labs (all labs ordered are listed, but only abnormal results are displayed) Labs interpreted as -   Lactic acid elevated at 5.4.  After fluid resuscitation repeat lactic acid is downtrending.  No significant electrolyte abnormalities.  Labs Reviewed  LACTIC ACID, PLASMA - Abnormal; Notable for the following components:      Result Value   Lactic Acid, Venous 5.4 (*)    All other components within normal limits  COMPREHENSIVE METABOLIC PANEL - Abnormal; Notable for the following components:   Glucose, Bld 103 (*)    Albumin 3.3 (*)    All other components within normal limits  LACTIC ACID, PLASMA - Abnormal; Notable for the following components:   Lactic Acid, Venous 2.2 (*)    All other components within normal limits  RESP PANEL BY RT-PCR (FLU A&B, COVID) ARPGX2  CULTURE, BLOOD (ROUTINE X 2)  CULTURE, BLOOD (ROUTINE X 2)  URINE CULTURE  RESPIRATORY PANEL BY PCR  CBC WITH DIFFERENTIAL/PLATELET   PROTIME-INR  APTT  URINALYSIS, COMPLETE (UACMP) WITH MICROSCOPIC  LACTIC ACID, PLASMA  LACTIC ACID, PLASMA    Consulted the hospitalist and discussed patient's case with Dr. Joylene Igo.  Patient fluid responsive.  MAP of 85.    PROCEDURES:  Critical Care performed: Yes  Procedures  Total critical care time: 55 minutes  Critical care time was exclusive of separately billable procedures and treating other patients.  Critical care was necessary to treat or prevent imminent or life-threatening deterioration.  Critical care was time spent personally by me on the following activities: development of treatment plan with patient and/or surrogate as well as nursing, discussions with consultants, evaluation of patient's response to treatment, examination of patient, obtaining history from patient or surrogate, ordering and performing treatments and interventions, ordering and review of laboratory studies, ordering and review of radiographic studies, pulse oximetry and re-evaluation of patient's condition.   Patient's presentation is most consistent with acute presentation with potential threat to life or bodily function.   MEDICATIONS ORDERED IN ED: Medications  lactated ringers infusion ( Intravenous New Bag/Given 09/24/22 1229)  pravastatin (PRAVACHOL) tablet 20 mg (has no administration in time range)  chlorproMAZINE (THORAZINE) tablet 50 mg (has no administration in time range)  FLUoxetine (PROZAC) capsule 20 mg (has no administration in time range)  OLANZapine (ZYPREXA) tablet 20 mg (has no administration in time range)  traZODone (DESYREL) tablet 50 mg (has no administration in time range)  glycopyrrolate (ROBINUL) tablet 2 mg (has no administration in time range)  pantoprazole (PROTONIX) EC tablet 40 mg (has no administration in time range)  cyanocobalamin (VITAMIN B12) tablet 1,000 mcg (has no administration in time range)  benztropine (COGENTIN) tablet 1 mg (has no administration  in time range)  budesonide (PULMICORT) nebulizer solution 0.25 mg (has no administration in time range)  fluticasone (FLONASE) 50 MCG/ACT nasal spray 1 spray (has no administration in time range)  ipratropium-albuterol (DUONEB) 0.5-2.5 (3) MG/3ML nebulizer solution 3 mL (has no administration in time range)  montelukast (SINGULAIR) tablet 10 mg (has no administration in time range)  enoxaparin (LOVENOX) injection 40 mg (has no administration in time range)  lactated ringers infusion (has no administration in time range)  cefTRIAXone (ROCEPHIN) 2 g in sodium chloride 0.9 % 100 mL IVPB (has no administration in time range)  azithromycin (ZITHROMAX) 500 mg in sodium chloride 0.9 % 250 mL IVPB (has no administration in time range)  acetaminophen (TYLENOL) tablet 650 mg (has no administration in time  range)    Or  acetaminophen (TYLENOL) suppository 650 mg (has no administration in time range)  ondansetron (ZOFRAN) tablet 4 mg (has no administration in time range)    Or  ondansetron (ZOFRAN) injection 4 mg (has no administration in time range)  lidocaine (LIDODERM) 5 % 1 patch (has no administration in time range)  lactated ringers bolus 1,000 mL (0 mLs Intravenous Stopped 09/24/22 1144)    And  lactated ringers bolus 1,000 mL (0 mLs Intravenous Stopped 09/24/22 1143)    And  lactated ringers bolus 250 mL (0 mLs Intravenous Stopped 09/24/22 1400)  cefTRIAXone (ROCEPHIN) 1 g in sodium chloride 0.9 % 100 mL IVPB (0 g Intravenous Stopped 09/24/22 1213)  azithromycin (ZITHROMAX) 500 mg in sodium chloride 0.9 % 250 mL IVPB (0 mg Intravenous Stopped 09/24/22 1300)    FINAL CLINICAL IMPRESSION(S) / ED DIAGNOSES   Final diagnoses:  Confusion  Community acquired pneumonia, unspecified laterality  Hypotension, unspecified hypotension type     Rx / DC Orders   ED Discharge Orders     None        Note:  This document was prepared using Dragon voice recognition software and may include  unintentional dictation errors.   Corena Herter, MD 09/24/22 [phone]

## 2022-09-24 NOTE — ED Notes (Signed)
No urine yet from Ian Medina. LR boluses still infusing (to gravity).

## 2022-09-24 NOTE — ED Triage Notes (Signed)
Pt to ED from adult group home, [facility], [place]. [place] called out b/c pt "not himself" and has been having falls, also not eating, drinking as much as usual  Has intellectual disability, not good historian  Initial VS: HR 120, BP 80s/60s. CBG was 189. ETCO2 20. RR 30s-40s. Has gotten 341mL LR. Last SBP 107, last HR 102 Pt alert. EDP at bedside, pt denies pain  EDP at bedside

## 2022-09-24 NOTE — Assessment & Plan Note (Signed)
Speech therapy consult for swallow function evaluation

## 2022-09-24 NOTE — Evaluation (Signed)
Clinical/Bedside Swallow Evaluation Patient Details  Name: Ian Medina MRN: [id number] Date of Birth: [birth year]  Today's Date: 09/24/2022 Time: SLP Start Time (ACUTE ONLY): 1540 SLP Stop Time (ACUTE ONLY): 1555 SLP Time Calculation (min) (ACUTE ONLY): 15 min  Past Medical History:  Past Medical History:  Diagnosis Date   Auditory hallucination    COPD with emphysema (HCC)    Drug abuse (HCC)    GAD (generalized anxiety disorder)    GERD (gastroesophageal reflux disease)    Hepatitis C    Hypercholesterolemia    Hypertension    Middle cerebral artery aneurysm    Mild intellectual disabilities    Paranoia (HCC)    Pneumonia 2017   Psoriasis    Schizoaffective disorder, depressive type (HCC)    Schizophrenia (HCC)    diagnosed at age 74, treated by ACT team   Past Surgical History:  Past Surgical History:  Procedure Laterality Date   CHEST TUBE INSERTION     COLONOSCOPY  Nov 2011   Dr. Samuella Cota: normal, internal hemorrhoids   COLONOSCOPY  Sept 2009   small internal hemorrhoids, normal    ESOPHAGOGASTRODUODENOSCOPY  June 2010   Dr. Samuella Cota: normal   ESOPHAGOGASTRODUODENOSCOPY  Jan 2009   Dr. Allena Katz: linear esophagitis, mild stricture, diffuse gastritis and mild duodenitis, PATH: chronic gastritis, negative H.pylori    FOOT SURGERY     right   HPI:  Ian Medina is a 66 y.o. male with medical history significant for mild intellectual disability, schizoaffective disorder, depressive type, GERD, generalized anxiety disorder, COPD who resides in an adult group home and was brought into the ER by EMS for evaluation. Pt admitted for sepsis due to PNA. SLP spoke with staff member at group home to confirm that pt was on a regular solids and thin liquids diet at baseline. Chest CT 09/24/22: "The appearance of the chest suggests probable multilobar bilateral bronchopneumonia, likely from atypical infection. This is apparently acute given the change between the September 24, 2022 chest  x-ray and the prior chest x-ray from 02/26/2022. In addition to multiple old healed and healing bilateral rib fractures there are acute mildly displaced fractures of the posterolateral left ninth and tenth ribs. No pneumothorax at this time. Diffuse bronchial wall thickening with moderate centrilobular and paraseptal emphysema; imaging findings suggestive of underlying COPD. Aortic atherosclerosis, in addition to 2 vessel coronary artery disease. Please note that although the presence of coronary artery calcium documents the presence of coronary artery disease, the severity of this disease and any potential stenosis cannot be assessed on this non-gated CT examination. Assessment for potential risk factor modification, dietary therapy or pharmacologic therapy may be warranted, if clinically indicated. Cholelithiasis."    Assessment / Plan / Recommendation  Clinical Impression  Pt seen for bedside swallow assessment in the setting of concern for aspiration PNA. Pt with past hx of dysphagia per MBSS completed in 2015, "mi/mod oropharyngeal phase dysphagia and mild cervical esophageal phase. Oral phase is characterized by reduced lingual movement, with reduced anterior posterior transit/bolus propulsion and cohesion of bolus resulting in piecemeal deglutition and lingual residue. Bolus spills over base of tongue prematurely without lingual movement. Pharyngeal phase is characterized by delay in swallow initiation (triggered after filling the valleculae and spilling to pyriforms with thins), reduces laryngeal elevation, reduced tongue base retraction, reduced epiglottic deflection and airway closure resulting in penetration and aspiration of thins before and during the swallow which was not always cleared with spontaneous cough. Pt had mild to moderate pharyngeal residue which  he occasionally cleared with spontaneous secondary swallow. He responded well to implement effortful swallow and repeat swallow to clear.  Reduced UES relaxation noted with incomplete clearance of tail of bolus. Recommend D3/mech soft and nectar-thick liquids with implementation of effortful swallow, small bites/sips, and swallow 2x for each bite/sip."       Today, pt resting in bed on room air and agreeable to assessment. O2 saturations maintained at 96-100 for duration of session. Oral motor function is grossly unremarkable. Pt edentulous, reporting plan for dentures, but none in possession currently. At baseline pt reports mostly consuming soft solids (like meatloaf). Pt seen with trials of thin liquids (via straw), puree solids, and soft solids. Regular solids held based on dentition. No overt or subtle s/sx pharyngeal dysphagia noted. No change to vocal quality across trials. Vitals stable for duration of trials. Oral phase prolonged secondary to dentition, with use of soft solids and liquid wash facilitated oral manipulation and clearance. Noted impulsivity with liquid intake- requiring verbal cues for slowing rate.   Based on hx of dysphagia and current concern for aspiration PNA, recommend follow up MBSS to r/o pharyngeal dysphagia. In the interim, recommend Dys 2 (fine chopped) and thin liquids with aspiration precautions (slow rate, small bites, elevated HOB, and alert for PO intake). Intermittent supervision for safety in the setting of impulsivity. MD and RN aware and in agreement with recommendations. SLP will follow up with MBSS to further develop POC. SLP Visit Diagnosis: Dysphagia, unspecified (R13.10)    Aspiration Risk  Mild aspiration risk    Diet Recommendation   Dys 2 (chopped) and Thin liquids   Medication Administration: Whole meds with liquid    Other  Recommendations Oral Care Recommendations: Oral care BID    Recommendations for follow up therapy are one component of a multi-disciplinary discharge planning process, led by the attending physician.  Recommendations may be updated based on patient status,  additional functional criteria and insurance authorization.  Follow up Recommendations Other (comment) (TBD)      Assistance Recommended at Discharge Intermittent Supervision/Assistance  Functional Status Assessment Patient has had a recent decline in their functional status and demonstrates the ability to make significant improvements in function in a reasonable and predictable amount of time.  Frequency and Duration  (TBD following MBSS)   (TBD following MBSS)        Swallow Study   General Date of Onset: 09/24/22 HPI: Ian Medina is a 66 y.o. male with medical history significant for mild intellectual disability, schizoaffective disorder, depressive type, GERD, generalized anxiety disorder, COPD who resides in an adult group home and was brought into the ER by EMS for evaluation. Pt admitted for sepsis due to PNA. SLP spoke with staff member at group home to confirm that pt was on a regular solids and thin liquids diet at baseline. Chest CT 09/24/22: "The appearance of the chest suggests probable multilobar bilateral bronchopneumonia, likely from atypical infection. This is apparently acute given the change between the September 24, 2022 chest x-ray and the prior chest x-ray from 02/26/2022. In addition to multiple old healed and healing bilateral rib fractures there are acute mildly displaced fractures of the posterolateral left ninth and tenth ribs. No pneumothorax at this time. Diffuse bronchial wall thickening with moderate centrilobular and paraseptal emphysema; imaging findings suggestive of underlying COPD. Aortic atherosclerosis, in addition to 2 vessel coronary artery disease. Please note that although the presence of coronary artery calcium documents the presence of coronary artery disease, the severity of  this disease and any potential stenosis cannot be assessed on this non-gated CT examination. Assessment for potential risk factor modification, dietary therapy or pharmacologic therapy  may be warranted, if clinically indicated. Cholelithiasis." Type of Study: Bedside Swallow Evaluation Previous Swallow Assessment: MBSS in 2015 Diet Prior to this Study: Dysphagia 1 (puree);Thin liquids Temperature Spikes Noted: No (WBC 10.0) Respiratory Status: Room air History of Recent Intubation: No Behavior/Cognition: Alert;Cooperative;Pleasant mood Oral Cavity Assessment: Within Functional Limits Oral Care Completed by SLP: Yes Oral Cavity - Dentition: Edentulous Vision: Functional for self-feeding Self-Feeding Abilities: Able to feed self Patient Positioning: Upright in bed Baseline Vocal Quality: Normal Volitional Cough: Cognitively unable to elicit Volitional Swallow: Unable to elicit    Oral/Motor/Sensory Function Overall Oral Motor/Sensory Function: Within functional limits   Ice Chips Ice chips: Within functional limits   Thin Liquid Thin Liquid: Within functional limits    Nectar Thick Nectar Thick Liquid: Not tested   Honey Thick Honey Thick Liquid: Not tested   Puree Puree: Within functional limits   Solid   [place] Kentaro Alewine Clapp  MS [facility] SLP  Solid: Within functional limits Other Comments: soft solids consistent      [place] J Clapp 09/24/2022,6:00 PM

## 2022-09-24 NOTE — Assessment & Plan Note (Addendum)
As evidenced by tachycardia, tachypnea, hypotension that responded to IV fluid resuscitation. Labs show markedly elevated lactic acid levels Chest x-ray shows findings suggestive of multifocal pneumonia Continue aggressive IV fluid resuscitation lactic acid levels normalized with hydration.  Procalcitonin within normal limits Continue empiric Rocephin and Zithromax for now

## 2022-09-24 NOTE — Sepsis Progress Note (Signed)
Sepsis protocol is being followed by [facility]. 

## 2022-09-24 NOTE — ED Notes (Signed)
Called lab re: where is second lactic? It was sent at 1330. First lactic was sent at 1130. 2nd not showing up and was sent at 1330. They have the blood and will run it.

## 2022-09-24 NOTE — Progress Notes (Signed)
CODE SEPSIS - PHARMACY COMMUNICATION  **Broad Spectrum Antibiotics should be administered within 1 hour of Sepsis diagnosis**  Time Code Sepsis Called/Page Received: 1112  Antibiotics Ordered: Azithromycin, Rocephin  Time of 1st antibiotic administration: 1140  Additional action taken by pharmacy: N/A     Alison Murray ,PharmD Clinical Pharmacist  09/24/2022  12:04 PM

## 2022-09-24 NOTE — Assessment & Plan Note (Signed)
We will place patient on fall precautions PT evaluation once stable

## 2022-09-25 ENCOUNTER — Inpatient Hospital Stay: Payer: 59

## 2022-09-25 DIAGNOSIS — F251 Schizoaffective disorder, depressive type: Secondary | ICD-10-CM

## 2022-09-25 DIAGNOSIS — A419 Sepsis, unspecified organism: Secondary | ICD-10-CM | POA: Diagnosis present

## 2022-09-25 DIAGNOSIS — R1313 Dysphagia, pharyngeal phase: Secondary | ICD-10-CM

## 2022-09-25 DIAGNOSIS — R4189 Other symptoms and signs involving cognitive functions and awareness: Secondary | ICD-10-CM | POA: Diagnosis not present

## 2022-09-25 DIAGNOSIS — J189 Pneumonia, unspecified organism: Secondary | ICD-10-CM | POA: Diagnosis not present

## 2022-09-25 DIAGNOSIS — R6520 Severe sepsis without septic shock: Secondary | ICD-10-CM | POA: Diagnosis present

## 2022-09-25 LAB — CBC
HCT: 36.8 % — ABNORMAL LOW (ref 39.0–52.0)
Hemoglobin: 12.1 g/dL — ABNORMAL LOW (ref 13.0–17.0)
MCH: 30.6 pg (ref 26.0–34.0)
MCHC: 32.9 g/dL (ref 30.0–36.0)
MCV: 92.9 fL (ref 80.0–100.0)
Platelets: 220 10*3/uL (ref 150–400)
RBC: 3.96 MIL/uL — ABNORMAL LOW (ref 4.22–5.81)
RDW: 15.1 % (ref 11.5–15.5)
WBC: 5.5 10*3/uL (ref 4.0–10.5)
nRBC: 0 % (ref 0.0–0.2)

## 2022-09-25 LAB — MAGNESIUM: Magnesium: 1.7 mg/dL (ref 1.7–2.4)

## 2022-09-25 LAB — BASIC METABOLIC PANEL
Anion gap: 9 (ref 5–15)
BUN: 6 mg/dL — ABNORMAL LOW (ref 8–23)
CO2: 27 mmol/L (ref 22–32)
Calcium: 8.4 mg/dL — ABNORMAL LOW (ref 8.9–10.3)
Chloride: 106 mmol/L (ref 98–111)
Creatinine, Ser: 0.74 mg/dL (ref 0.61–1.24)
GFR, Estimated: >60 mL/min (ref >60)
Glucose, Bld: 97 mg/dL (ref 70–99)
Potassium: 3.4 mmol/L — ABNORMAL LOW (ref 3.5–5.1)
Sodium: 142 mmol/L (ref 135–145)

## 2022-09-25 LAB — PROTIME-INR
INR: 1.3 — ABNORMAL HIGH (ref 0.8–1.2)
Prothrombin Time: 16.3 seconds — ABNORMAL HIGH (ref 11.4–15.2)

## 2022-09-25 LAB — TSH: TSH: 2.267 u[IU]/mL (ref 0.350–4.500)

## 2022-09-25 LAB — SEDIMENTATION RATE: Sed Rate: 46 mm/hr — ABNORMAL HIGH (ref 0–20)

## 2022-09-25 LAB — VITAMIN B12: Vitamin B-12: 619 pg/mL (ref 180–914)

## 2022-09-25 LAB — CORTISOL-AM, BLOOD: Cortisol - AM: 19.2 ug/dL (ref 6.7–22.6)

## 2022-09-25 LAB — PHOSPHORUS: Phosphorus: 4 mg/dL (ref 2.5–4.6)

## 2022-09-25 LAB — PROCALCITONIN: Procalcitonin: <0.1 ng/mL

## 2022-09-25 LAB — FOLATE: Folate: 8 ng/mL (ref >5.9)

## 2022-09-25 LAB — HIV ANTIBODY (ROUTINE TESTING W REFLEX): HIV Screen 4th Generation wRfx: NONREACTIVE

## 2022-09-25 MED ORDER — POTASSIUM CHLORIDE CRYS ER 20 MEQ PO TBCR
40.0000 meq | EXTENDED_RELEASE_TABLET | Freq: Once | ORAL | Status: AC
  Administered 2022-09-25: 40 meq via ORAL
  Filled 2022-09-25: qty 2

## 2022-09-25 MED ORDER — THIAMINE MONONITRATE 100 MG PO TABS
100.0000 mg | ORAL_TABLET | Freq: Every day | ORAL | Status: DC
  Administered 2022-09-25 – 2022-09-28 (×4): 100 mg via ORAL
  Filled 2022-09-25 (×4): qty 1

## 2022-09-25 MED ORDER — IPRATROPIUM-ALBUTEROL 0.5-2.5 (3) MG/3ML IN SOLN
3.0000 mL | Freq: Two times a day (BID) | RESPIRATORY_TRACT | Status: DC
  Administered 2022-09-26 – 2022-09-27 (×3): 3 mL via RESPIRATORY_TRACT
  Filled 2022-09-25 (×3): qty 3

## 2022-09-25 MED ORDER — LOPERAMIDE HCL 2 MG PO CAPS: 2.0000 mg | ORAL_CAPSULE | ORAL | Status: DC | PRN

## 2022-09-25 MED ORDER — ADULT MULTIVITAMIN W/MINERALS CH
1.0000 | ORAL_TABLET | Freq: Every day | ORAL | Status: DC
  Administered 2022-09-25 – 2022-09-28 (×4): 1 via ORAL
  Filled 2022-09-25 (×4): qty 1

## 2022-09-25 MED ORDER — LORAZEPAM 1 MG PO TABS
1.0000 mg | ORAL_TABLET | ORAL | Status: DC | PRN
  Administered 2022-09-25 (×2): 1 mg via ORAL
  Administered 2022-09-25: 3 mg via ORAL
  Filled 2022-09-25 (×2): qty 1
  Filled 2022-09-25: qty 3

## 2022-09-25 MED ORDER — LORAZEPAM 2 MG/ML IJ SOLN
1.0000 mg | INTRAMUSCULAR | Status: DC | PRN
  Administered 2022-09-25 – 2022-09-26 (×2): 2 mg via INTRAVENOUS
  Filled 2022-09-25 (×2): qty 1

## 2022-09-25 MED ORDER — FOLIC ACID 1 MG PO TABS
1.0000 mg | ORAL_TABLET | Freq: Every day | ORAL | Status: DC
  Administered 2022-09-25 – 2022-09-28 (×4): 1 mg via ORAL
  Filled 2022-09-25 (×4): qty 1

## 2022-09-25 MED ORDER — THIAMINE HCL 100 MG/ML IJ SOLN: 100.0000 mg | Freq: Every day | INTRAMUSCULAR | Status: DC

## 2022-09-25 NOTE — ED Notes (Signed)
Pt changed several times throughout the night, soiled with urine and stool. Pt changed every time and reoriented to surroundings.

## 2022-09-25 NOTE — ED Notes (Signed)
Unable to obtain morning labs, called lab

## 2022-09-25 NOTE — Progress Notes (Signed)
  Progress Note   Patient: Ian Medina [id number]:[id number] DOB: [birth year] DOA: 09/24/2022     1 DOS: the patient was seen and examined on 09/25/2022   Brief hospital course: No notes on file  Assessment and Plan: * Sepsis due to pneumonia ([place]) As evidenced by tachycardia, tachypnea, hypotension that responded to IV fluid resuscitation. Labs show markedly elevated lactic acid levels Chest x-ray shows findings suggestive of multifocal pneumonia Continue aggressive IV fluid resuscitation lactic acid levels normalized with hydration.  Procalcitonin within normal limits Continue empiric Rocephin and Zithromax for now    Multiple rib fractures Status post falls with several healing and new acute rib fractures. Pain is well controlled on current regimen Incentive spirometry while awake  Falls Likely multifactorial fall precautions PT and OT evaluation tomorrow  Schizoaffective disorder, depressive type ([place]) Continue Thorazine, trazodone and Prozac.  Dysphagia Appreciate ST evaluation.  Recommends continuing thin liquids with dysphagia 2 with strict aspiration precaution.  He was found to have moderate pharyngeal deficit          Subjective: Seems confused.  Not sure why he is here  Physical Exam: Vitals:   09/25/22 0546 09/25/22 1000 09/25/22 1100 09/25/22 1437  BP: 99/71  98/77 94/77  Pulse: 82  90 92  Resp: 18     Temp: 98.2 F (36.8 C) 98.6 F (37 C) 98 F (36.7 C) 98 F (36.7 C)  TempSrc: Oral Oral Oral Oral  SpO2: 97%  94% 100%  Weight:      Height:       66 year old dishelved male lying in the bed comfortably without any acute distress Lungs clear to auscultation bilaterally.  Did not find any tender chest wall points on exam Cardiovascular regular rate and rhythm Abdomen soft, benign Neuro awake and alert, nonfocal.  Seems disoriented and confused Psych: Impulsive Data Reviewed:  Potassium 3.4  Family Communication: None  Disposition: Status is:  Inpatient Remains inpatient appropriate because: Management of possible pneumonia.,  Waiting for PT and OT eval   Planned Discharge Destination: Group home    DVT prophylaxis-Lovenox Time spent: 35 minutes  Author: Max Sane, MD 09/25/2022 4:42 PM  For on call review [email or web address].

## 2022-09-25 NOTE — Assessment & Plan Note (Addendum)
Seems confused and disoriented.  Will obtain MRI of the brain to rule out any acute pathology.  Unsure baseline mental status

## 2022-09-25 NOTE — Evaluation (Addendum)
Objective Swallowing Evaluation: Type of Study: MBS-Modified Barium Swallow Study   Patient Details  Name: Ian Medina MRN: [id number] Date of Birth: 1955/12/26  Today's Date: 09/25/2022 Time: SLP Start Time (ACUTE ONLY): 1345 -SLP Stop Time (ACUTE ONLY): 7673  SLP Time Calculation (min) (ACUTE ONLY): 60 min   Past Medical History:  Past Medical History:  Diagnosis Date   Auditory hallucination    COPD with emphysema ([place])    Drug abuse ([place])    GAD (generalized anxiety disorder)    GERD (gastroesophageal reflux disease)    Hepatitis C    Hypercholesterolemia    Hypertension    Middle cerebral artery aneurysm    Mild intellectual disabilities    Paranoia ([place])    Pneumonia 2017   Psoriasis    Schizoaffective disorder, depressive type ([place])    Schizophrenia ([place])    diagnosed at age 85, treated by ACT team   Past Surgical History:  Past Surgical History:  Procedure Laterality Date   CHEST TUBE INSERTION     COLONOSCOPY  Nov 2011   Dr. Earley Brooke: normal, internal hemorrhoids   COLONOSCOPY  Sept 2009   small internal hemorrhoids, normal    ESOPHAGOGASTRODUODENOSCOPY  June 2010   Dr. Earley Brooke: normal   ESOPHAGOGASTRODUODENOSCOPY  Jan 2009   Dr. Posey Pronto: linear esophagitis, mild stricture, diffuse gastritis and mild duodenitis, PATH: chronic gastritis, negative H.pylori    FOOT SURGERY     right   HPI: Ian Medina is a 66 y.o. male with medical history significant for mild intellectual disability, schizoaffective disorder, depressive type, GERD, generalized anxiety disorder, COPD who resides in an adult group home and was brought into the ER by EMS for evaluation. Pt admitted for sepsis due to PNA. SLP spoke with staff member at group home to confirm that pt was on a regular solids and thin liquids diet at baseline. Chest CT 09/24/22: "The appearance of the chest suggests probable multilobar bilateral bronchopneumonia, likely from atypical infection. This is apparently acute  given the change between the September 24, 2022 chest x-ray and the prior chest x-ray from 02/26/2022. In addition to multiple old healed and healing bilateral rib fractures there are acute mildly displaced fractures of the posterolateral left ninth and tenth ribs. No pneumothorax at this time. Diffuse bronchial wall thickening with moderate centrilobular and paraseptal emphysema; imaging findings suggestive of underlying COPD. Aortic atherosclerosis, in addition to 2 vessel coronary artery disease. Please note that although the presence of coronary artery calcium documents the presence of coronary artery disease, the severity of this disease and any potential stenosis cannot be assessed on this non-gated CT examination. Assessment for potential risk factor modification, dietary therapy or pharmacologic therapy may be warranted, if clinically indicated. Cholelithiasis." Pt currently on a Dys 2 and thin liquids diet and room air.   Subjective: pt alert and agreeable to session    Recommendations for follow up therapy are one component of a multi-disciplinary discharge planning process, led by the attending physician.  Recommendations may be updated based on patient status, additional functional criteria and insurance authorization.  Assessment / Plan / Recommendation     09/25/2022    2:00 PM  Clinical Impressions  Clinical Impression Pt presents with moderate oropharyngeal dysphagia. Pharyngeal phase notable for reduced laryngeal elevation, tongue base retraction, epiglottic deflection and airway closure resulting in aspiration/penetration of liquids and pharyngeal residue. Transient penetration noted with tsp of thin/nectar thick liquids; however with cup/straw presentation, penetration during the swallow remained in laryngeal vestibule and  extended below the trachea (aspiration) after the swallow. Pt inconsistently responsive to penetration/aspiration, with cough not efficient for clearance. Cued throat  clear/cough proved ineffective to reduce penetration. Further moderate vallecula and pyriform residue noted, requiring verbal cues for secondary swallow for clearance. Reduced UES relaxation contributed to residue at level of UES and pyriform sinus. Oral phase impacted by cognition and limited dentition, leading to reduced awareness of bolus and slowed oral manipulation. Piecemeal deglutition and oral residue noted at completion of solid trials. Regular solids and sequential swallows not completed secondary to risk of aspiration and oral phase deficits. Pt severely distractible during evaluation, requiring max verbal cues provided for remaining still, redirection, and utilization of slow/small sips through out assessment.      Pt's current dysphagia is advanced since MBSS completed in 2015, suggesting an acute on chronic dysphagia.  Suspect that pt will NOT be compliant with thick liquids (which were previously recommended in 2015) and thick liquids does not eliminate the risk of penetration/aspiration. Further, pt has been grossly stable on thin liquids (per group home) prior to admission. Therefore, recommend continued thin liquids and Dys 2 with STRICT aspiration precautions and monitoring respiratory function. Aspiration precautions include SMALL sips, slow rate, minimizing distractions, and upright/alert for PO intake. Constant supervision for monitoring and compliance with aspiration precautions. Medications administered with puree. MD/RN in agreement with recommendations. SLP will trial intervention for training for use of aspiration precautions/compensatory strategies; however, cognition will likely limit carryover for training. SLP will continue to follow  SLP Visit Diagnosis Dysphagia, oropharyngeal phase (R13.12)  Impact on safety and function Moderate aspiration risk         09/25/2022    2:00 PM  Treatment Recommendations  Treatment Recommendations Therapy as outlined in treatment plan below         09/25/2022    2:00 PM  Prognosis  Prognosis for Safe Diet Advancement Guarded  Barriers to Reach Goals Cognitive deficits;Severity of deficits;Time post onset       09/25/2022    2:00 PM  Diet Recommendations  SLP Diet Recommendations Dysphagia 2 (Fine chop) solids;Thin liquid  Liquid Administration via Cup;Spoon;Other (Comment)  Medication Administration Whole meds with puree  Compensations Minimize environmental distractions;Slow rate;Small sips/bites  Postural Changes Remain semi-upright after after feeds/meals (Comment);Seated upright at 90 degrees         09/25/2022    2:00 PM  Other Recommendations  Oral Care Recommendations Oral care BID  Follow Up Recommendations Home health SLP  Assistance recommended at discharge Frequent or constant Supervision/Assistance  Functional Status Assessment Patient has had a recent decline in their functional status and/or demonstrates limited ability to make significant improvements in function in a reasonable and predictable amount of time       09/25/2022    2:00 PM  Frequency and Duration   Speech Therapy Frequency (ACUTE ONLY) min 2x/week  Treatment Duration 1 week         09/25/2022    2:00 PM  Oral Phase  Oral Phase Impaired  Oral - Nectar Teaspoon Reduced posterior propulsion;Decreased bolus cohesion;Premature spillage  Oral - Nectar Cup Reduced posterior propulsion;Decreased bolus cohesion;Piecemeal swallowing;Premature spillage  Oral - Nectar Straw NT  Oral - Thin Teaspoon Reduced posterior propulsion;Premature spillage;Decreased bolus cohesion;Piecemeal swallowing  Oral - Thin Cup Premature spillage;Decreased bolus cohesion;Piecemeal swallowing;Pocketing in anterior sulcus;Reduced posterior propulsion  Oral - Thin Straw Premature spillage;Decreased bolus cohesion;Piecemeal swallowing;Reduced posterior propulsion  Oral - Puree Weak lingual manipulation;Impaired mastication;Piecemeal swallowing;Decreased bolus  cohesion  Oral -  Mech Soft NT  Oral - Regular NT  Oral - Multi-Consistency NT  Oral - Pill NT       09/25/2022    2:00 PM  Pharyngeal Phase  Pharyngeal Phase Impaired  Pharyngeal- Nectar Teaspoon Delayed swallow initiation-pyriform sinuses;Reduced epiglottic inversion;Reduced anterior laryngeal mobility;Reduced laryngeal elevation;Reduced airway/laryngeal closure;Reduced tongue base retraction;Penetration/Aspiration during swallow;Pharyngeal residue - cp segment;Pharyngeal residue - pyriform;Pharyngeal residue - valleculae  Pharyngeal Material enters airway, remains ABOVE vocal cords then ejected out  Pharyngeal- Nectar Cup Pharyngeal residue - cp segment;Pharyngeal residue - pyriform;Pharyngeal residue - valleculae;Trace aspiration;Penetration/Aspiration during swallow;Reduced tongue base retraction;Reduced airway/laryngeal closure;Reduced laryngeal elevation;Reduced anterior laryngeal mobility;Reduced epiglottic inversion;Reduced pharyngeal peristalsis;Delayed swallow initiation-pyriform sinuses  Pharyngeal Material enters airway, passes BELOW cords and not ejected out despite cough attempt by patient  Pharyngeal- Nectar Straw NT  Pharyngeal- Thin Teaspoon Delayed swallow initiation-pyriform sinuses;Penetration/Aspiration during swallow;Pharyngeal residue - valleculae;Pharyngeal residue - pyriform;Pharyngeal residue - posterior pharnyx;Pharyngeal residue - cp segment  Pharyngeal Material enters airway, remains ABOVE vocal cords then ejected out  Pharyngeal- Thin Cup Reduced epiglottic inversion;Reduced anterior laryngeal mobility;Reduced laryngeal elevation;Reduced airway/laryngeal closure;Reduced tongue base retraction;Penetration/Aspiration during swallow;Trace aspiration;Pharyngeal residue - valleculae;Pharyngeal residue - pyriform;Pharyngeal residue - posterior pharnyx;Pharyngeal residue - cp segment  Pharyngeal Material enters airway, passes BELOW cords without attempt by patient to eject  out (silent aspiration)  Pharyngeal- Thin Straw Reduced pharyngeal peristalsis;Delayed swallow initiation-pyriform sinuses;Reduced epiglottic inversion;Reduced anterior laryngeal mobility;Reduced laryngeal elevation;Reduced airway/laryngeal closure;Reduced tongue base retraction;Penetration/Aspiration during swallow;Pharyngeal residue - valleculae;Pharyngeal residue - pyriform;Pharyngeal residue - posterior pharnyx;Pharyngeal residue - cp segment;Inter-arytenoid space residue  Pharyngeal Material enters airway, passes BELOW cords and not ejected out despite cough attempt by patient  Pharyngeal- Puree Reduced pharyngeal peristalsis;Reduced epiglottic inversion;Reduced anterior laryngeal mobility;Reduced laryngeal elevation;Reduced airway/laryngeal closure;Pharyngeal residue - valleculae;Pharyngeal residue - pyriform;Pharyngeal residue - cp segment  Pharyngeal Material does not enter airway  Pharyngeal- Mechanical Soft NT  Pharyngeal- Regular NT  Pharyngeal- Multi-consistency NT  Pharyngeal- Pill NT        09/25/2022    2:00 PM  Cervical Esophageal Phase   Cervical Esophageal Phase Impaired    [place] Iara Monds Clapp  MS [facility] SLP   [place] J Clapp 09/25/2022, 3:02 PM

## 2022-09-26 ENCOUNTER — Inpatient Hospital Stay: Payer: 59

## 2022-09-26 DIAGNOSIS — R1313 Dysphagia, pharyngeal phase: Secondary | ICD-10-CM | POA: Diagnosis not present

## 2022-09-26 DIAGNOSIS — W19XXXD Unspecified fall, subsequent encounter: Secondary | ICD-10-CM

## 2022-09-26 DIAGNOSIS — J189 Pneumonia, unspecified organism: Secondary | ICD-10-CM | POA: Diagnosis not present

## 2022-09-26 DIAGNOSIS — R4189 Other symptoms and signs involving cognitive functions and awareness: Secondary | ICD-10-CM | POA: Diagnosis not present

## 2022-09-26 LAB — BASIC METABOLIC PANEL
Anion gap: 7 (ref 5–15)
BUN: <5 mg/dL — ABNORMAL LOW (ref 8–23)
CO2: 27 mmol/L (ref 22–32)
Calcium: 8.4 mg/dL — ABNORMAL LOW (ref 8.9–10.3)
Chloride: 108 mmol/L (ref 98–111)
Creatinine, Ser: 0.61 mg/dL (ref 0.61–1.24)
GFR, Estimated: >60 mL/min (ref >60)
Glucose, Bld: 96 mg/dL (ref 70–99)
Potassium: 3.6 mmol/L (ref 3.5–5.1)
Sodium: 142 mmol/L (ref 135–145)

## 2022-09-26 LAB — URINE CULTURE

## 2022-09-26 LAB — CBC
HCT: 38.5 % — ABNORMAL LOW (ref 39.0–52.0)
Hemoglobin: 12.3 g/dL — ABNORMAL LOW (ref 13.0–17.0)
MCH: 30.6 pg (ref 26.0–34.0)
MCHC: 31.9 g/dL (ref 30.0–36.0)
MCV: 95.8 fL (ref 80.0–100.0)
Platelets: 198 10*3/uL (ref 150–400)
RBC: 4.02 MIL/uL — ABNORMAL LOW (ref 4.22–5.81)
RDW: 15.4 % (ref 11.5–15.5)
WBC: 6 10*3/uL (ref 4.0–10.5)
nRBC: 0 % (ref 0.0–0.2)

## 2022-09-26 LAB — RPR: RPR Ser Ql: NONREACTIVE

## 2022-09-26 MED ORDER — BUDESONIDE 0.25 MG/2ML IN SUSP
0.2500 mg | Freq: Two times a day (BID) | RESPIRATORY_TRACT | Status: DC
  Administered 2022-09-26 – 2022-09-27 (×3): 0.25 mg via RESPIRATORY_TRACT
  Filled 2022-09-26 (×3): qty 2

## 2022-09-26 MED ORDER — ORAL CARE MOUTH RINSE: 15.0000 mL | OROMUCOSAL | Status: DC | PRN

## 2022-09-26 MED ORDER — ORAL CARE MOUTH RINSE
15.0000 mL | OROMUCOSAL | Status: DC
  Administered 2022-09-27 – 2022-09-28 (×5): 15 mL via OROMUCOSAL

## 2022-09-26 NOTE — Hospital Course (Addendum)
66 y.o. male with medical history significant for mild intellectual disability, schizoaffective disorder, depressive type, GERD, generalized anxiety disorder, COPD who resides in an adult group home was brought into the ER by EMS for falls,, altered mental status and poor p.o. intake.  Found to have pneumonia and multiple rib fractures  10/30: Continue antibiotic.  PT, OT eval 10/31: PT, OT recommends SNF.  MRI brain negative for acute pathology.  TOC looking for placement

## 2022-09-26 NOTE — Evaluation (Signed)
Occupational Therapy Evaluation Patient Details Name: Ian Medina MRN: [id number] DOB: [birth year] Today's Date: 09/26/2022   History of Present Illness Pt is a 66 y.o. male with medical history significant for mild intellectual disability, schizoaffective disorder, depressive type, GERD, generalized anxiety disorder, COPD who resides in an adult group home and was brought into the ER by EMS for evaluation. MD assessment includes: Sepsis due to pneumonia, multiple rib fractures, dysphagia, and falls.   Clinical Impression   Ian Medina was seen for OT evaluation this date. Pt is poor historian however per chart pt is from group home. Pt presents to acute OT demonstrating impaired ADL performance and functional mobility 2/2 decreased activity tolerance and functional strength/ROM/balance deficits.   Pt currently requires CGA for bed mobility. MIN A sit<>stand no AD use decreasing to MOD A for dynamic balance  grooming standing sink side - pt unable to sequence tooth brushing or facewashing. Attempts to use tooth brush to brush his hand and cues to turn sink on/off. Pt is impulsive and restless requiring redirection for line mgmt/safety. Pt redirected towards bed and quickly returns self to supine with blankets pulled over head. Left with alarm on. Upon hospital discharge, recommend STR to maximize pt safety and return to PLOF.    Recommendations for follow up therapy are one component of a multi-disciplinary discharge planning process, led by the attending physician.  Recommendations may be updated based on patient status, additional functional criteria and insurance authorization.   Follow Up Recommendations  Skilled nursing-short term rehab (<3 hours/day)    Assistance Recommended at Discharge Frequent or constant Supervision/Assistance  Patient can return home with the following A lot of help with walking and/or transfers;A lot of help with bathing/dressing/bathroom    Functional Status  Assessment  Patient has had a recent decline in their functional status and demonstrates the ability to make significant improvements in function in a reasonable and predictable amount of time.  Equipment Recommendations  BSC/3in1    Recommendations for Other Services       Precautions / Restrictions Precautions Precautions: Fall Restrictions Weight Bearing Restrictions: No      Mobility Bed Mobility Overal bed mobility: Needs Assistance Bed Mobility: Supine to Sit, Sit to Supine     Supine to sit: Min guard Sit to supine: Supervision   General bed mobility comments: assist for line mgmt    Transfers Overall transfer level: Needs assistance Equipment used: None Transfers: Sit to/from Stand Sit to Stand: Min assist           General transfer comment: assist for balance in standing      Balance Overall balance assessment: Needs assistance Sitting-balance support: No upper extremity supported, Feet supported Sitting balance-Leahy Scale: Fair     Standing balance support: No upper extremity supported, During functional activity Standing balance-Leahy Scale: Poor                             ADL either performed or assessed with clinical judgement   ADL Overall ADL's : Needs assistance/impaired                                       General ADL Comments: MOD A for ADL t/f and grooming stnading sink side - pt unable to sequence tooth brushing or facewashing. CGA for LB access seated EOB      Pertinent  Vitals/Pain Pain Assessment Pain Assessment: Faces Faces Pain Scale: No hurt     Hand Dominance     Extremity/Trunk Assessment Upper Extremity Assessment Upper Extremity Assessment: Generalized weakness   Lower Extremity Assessment Lower Extremity Assessment: Generalized weakness       Communication Communication Communication: Expressive difficulties (garbled speech)   Cognition Arousal/Alertness: Awake/alert Behavior  During Therapy: Restless, Impulsive Overall Cognitive Status: No family/caregiver present to determine baseline cognitive functioning Area of Impairment: Memory, Following commands, Safety/judgement                     Memory: Decreased recall of precautions, Decreased short-term memory Following Commands: Follows one step commands inconsistently Safety/Judgement: Decreased awareness of safety, Decreased awareness of deficits            Home Living Family/patient expects to be discharged to:: Group home                                 Additional Comments: Pt is a poor historian, unable to provide history, lives in a group home per chart review      Prior Functioning/Environment               Mobility Comments: per chart pt mobile however unable to confirm with pt ADLs Comments: unknown        OT Problem List: Decreased strength;Decreased range of motion;Decreased activity tolerance;Impaired balance (sitting and/or standing);Decreased safety awareness      OT Treatment/Interventions: Self-care/ADL training;Therapeutic exercise;Energy conservation;DME and/or AE instruction;Therapeutic activities;Patient/family education;Balance training    OT Goals(Current goals can be found in the care plan section) Acute Rehab OT Goals Patient Stated Goal: to lie down OT Goal Formulation: With patient Time For Goal Achievement: 10/10/22 Potential to Achieve Goals: Fair ADL Goals Pt Will Perform Grooming: with modified independence;standing Pt Will Perform Lower Body Dressing: with modified independence;sit to/from stand Pt Will Transfer to Toilet: with modified independence;ambulating;regular height toilet  OT Frequency: Min 2X/week    Co-evaluation              AM-PAC OT "6 Clicks" Daily Activity     Outcome Measure Help from another person eating meals?: A Little Help from another person taking care of personal grooming?: A Lot Help from another  person toileting, which includes using toliet, bedpan, or urinal?: A Lot Help from another person bathing (including washing, rinsing, drying)?: A Lot Help from another person to put on and taking off regular upper body clothing?: A Little Help from another person to put on and taking off regular lower body clothing?: A Little 6 Click Score: 15   End of Session    Activity Tolerance: Patient tolerated treatment well;Treatment limited secondary to agitation Patient left: in bed;with call bell/phone within reach;with bed alarm set  OT Visit Diagnosis: Other abnormalities of gait and mobility (R26.89);Muscle weakness (generalized) (M62.81);History of falling (Z91.81)                Time: 7106-2694 OT Time Calculation (min): 8 min Charges:  OT General Charges $OT Visit: 1 Visit OT Evaluation $OT Eval Low Complexity: 1 Low  Dessie Coma, M.S. OTR/L  09/26/22, 1:59 PM  ascom [phone]

## 2022-09-26 NOTE — Evaluation (Signed)
Physical Therapy Evaluation Patient Details Name: Ian Medina MRN: [id number] DOB: 1956/01/09 Today's Date: 09/26/2022  History of Present Illness  Pt is a 66 y.o. male with medical history significant for mild intellectual disability, schizoaffective disorder, depressive type, GERD, generalized anxiety disorder, COPD who resides in an adult group home and was brought into the ER by EMS for evaluation. MD assessment includes: Sepsis due to pneumonia, multiple rib fractures, dysphagia, and falls.   Clinical Impression  Pt restless and somewhat agitated at times during the session.  Pt unable to provide any history and required extensive time and multi-modal cuing to follow simple 1-step commands.  Pt required physical assistance with all functional tasks and presented with poor stability during transfer and gait assessment.  Pt is at a very high risk for falls and would likely not be safe to return to his prior living situation at this time.  Pt will benefit from PT services in a SNF setting upon discharge to safely address deficits listed in patient problem list for decreased caregiver assistance and eventual return to PLOF.           Recommendations for follow up therapy are one component of a multi-disciplinary discharge planning process, led by the attending physician.  Recommendations may be updated based on patient status, additional functional criteria and insurance authorization.  Follow Up Recommendations Skilled nursing-short term rehab (<3 hours/day) Can patient physically be transported by private vehicle: No    Assistance Recommended at Discharge Frequent or constant Supervision/Assistance  Patient can return home with the following  A lot of help with walking and/or transfers;A lot of help with bathing/dressing/bathroom;Assistance with cooking/housework;Direct supervision/assist for medications management;Direct supervision/assist for financial management;Help with stairs or ramp  for entrance;Assist for transportation    Equipment Recommendations Other (comment) (TBD at next venue of care)  Recommendations for Other Services       Functional Status Assessment Patient has had a recent decline in their functional status and demonstrates the ability to make significant improvements in function in a reasonable and predictable amount of time.     Precautions / Restrictions Precautions Precautions: Fall Restrictions Weight Bearing Restrictions: No      Mobility  Bed Mobility Overal bed mobility: Needs Assistance Bed Mobility: Supine to Sit     Supine to sit: Mod assist     General bed mobility comments: Mod A for BLE and trunk control    Transfers Overall transfer level: Needs assistance Equipment used: Rolling walker (2 wheels) Transfers: Sit to/from Stand Sit to Stand: Min assist, Mod assist, From elevated surface           General transfer comment: Min to mod A to come to full upright position and for stability upon initial stand    Ambulation/Gait Ambulation/Gait assistance: Min assist Gait Distance (Feet): 3 Feet Assistive device: Rolling walker (2 wheels) Gait Pattern/deviations: Step-through pattern, Decreased step length - right, Decreased step length - left, Trunk flexed, Shuffle Gait velocity: decreased     General Gait Details: Pt able to take several small steps near the EOB and from bed to chair with min A for stability and to guide the RW  Stairs            Wheelchair Mobility    Modified Rankin (Stroke Patients Only)       Balance Overall balance assessment: Needs assistance   Sitting balance-Leahy Scale: Fair     Standing balance support: Bilateral upper extremity supported, During functional activity Standing balance-Leahy Scale: Poor  Pertinent Vitals/Pain Pain Assessment Pain Assessment: PAINAD Breathing: normal Negative Vocalization: none Facial Expression:  smiling or inexpressive Body Language: relaxed Consolability: no need to console PAINAD Score: 0 Pain Intervention(s): Monitored during session    Home Living Family/patient expects to be discharged to:: Group home                   Additional Comments: Pt is a poor historian, unable to provide history, lives in a group home per chart review    Prior Function               Mobility Comments: unknown, pt unable to provide history ADLs Comments: unknown     Hand Dominance        Extremity/Trunk Assessment   Upper Extremity Assessment Upper Extremity Assessment: Generalized weakness    Lower Extremity Assessment Lower Extremity Assessment: Generalized weakness       Communication      Cognition Arousal/Alertness: Lethargic Behavior During Therapy: Restless, Agitated Overall Cognitive Status: No family/caregiver present to determine baseline cognitive functioning                                          General Comments      Exercises     Assessment/Plan    PT Assessment Patient needs continued PT services  PT Problem List Decreased strength;Decreased activity tolerance;Decreased balance;Decreased mobility;Decreased knowledge of use of DME       PT Treatment Interventions DME instruction;Gait training;Stair training;Functional mobility training;Therapeutic activities;Therapeutic exercise;Balance training;Patient/family education    PT Goals (Current goals can be found in the Care Plan section)  Acute Rehab PT Goals PT Goal Formulation: Patient unable to participate in goal setting Time For Goal Achievement: 10/09/22 Potential to Achieve Goals: Fair    Frequency Min 2X/week     Co-evaluation               AM-PAC PT "6 Clicks" Mobility  Outcome Measure Help needed turning from your back to your side while in a flat bed without using bedrails?: A Lot Help needed moving from lying on your back to sitting on the side of  a flat bed without using bedrails?: A Lot Help needed moving to and from a bed to a chair (including a wheelchair)?: A Lot Help needed standing up from a chair using your arms (e.g., wheelchair or bedside chair)?: A Little Help needed to walk in hospital room?: Total Help needed climbing 3-5 steps with a railing? : Total 6 Click Score: 11    End of Session Equipment Utilized During Treatment: Gait belt Activity Tolerance: Patient tolerated treatment well Patient left: in chair;with call bell/phone within reach;with chair alarm set;with nursing/sitter in room Nurse Communication: Mobility status PT Visit Diagnosis: Unsteadiness on feet (R26.81);Difficulty in walking, not elsewhere classified (R26.2);Muscle weakness (generalized) (M62.81)    Time: [id number] PT Time Calculation (min) (ACUTE ONLY): 18 min   Charges:   PT Evaluation $PT Eval Moderate Complexity: 1 Mod         D. Scott Raliyah Montella PT, DPT 09/26/22, 1:13 PM

## 2022-09-26 NOTE — Assessment & Plan Note (Signed)
Continue Thorazine, trazodone and Prozac.

## 2022-09-26 NOTE — Assessment & Plan Note (Signed)
As evidenced by tachycardia, tachypnea, hypotension that responded to IV fluid resuscitation. Labs show markedly elevated lactic acid levels Chest x-ray shows findings suggestive of multifocal pneumonia Continue aggressive IV fluid resuscitation lactic acid levels normalized with hydration.  Procalcitonin within normal limits Continue empiric Rocephin and Zithromax for now

## 2022-09-26 NOTE — Progress Notes (Signed)
Speech Language Pathology Treatment: Dysphagia (education w/ NSG on aspiration precautions)  Patient Details Name: Ian Medina MRN: [id number] DOB: 1956-01-29 Today's Date: 09/26/2022 Time: [id number] SLP Time Calculation (min) (ACUTE ONLY): 20 min  Assessment / Plan / Recommendation Clinical Impression  Pt seen for follow up w/ toleration of diet; education on aspiration precautions resulting from MBSS yesterday. Pt sleeping in bed; did not awaken but to only open eyes briefly to verbal/tactile stim by SLP and NSG -- education completed w/ NSG in room.  Pt afebrile; on RA.   Discussed pt's swallowing presentation per the MBSS yesterday and his risk for aspiration w/ NSG; discussed the need for Supervision at all meals/po's for follow through w/ aspiration precautions d/t Cognitive/intellectual disability including: single, small sip sips via TSP/Cup, small bites, clear mouth b/t and use a f/u swallow to clear any residue, cough/throat clear during/post po's to aid airway protection.  Pt is on a Dysphagia level 2 diet w/ thin liquids currently w/ monitoring of his Pulmonary status.  Discussed pt's presentation and chronic Dysphagia and risk for aspiration/aspiration pneumonia w/ MD also -- recommended a Palliative Care consult for [name] d/t pt's presentation. Recommend continue w/ current diet as ordered w/ strict aspiration precautions, frequent oral care and before meals, and NSG supervision w/ oral intake. ST services can be available for further education if needed while admitted. MD agreed.        HPI HPI: Ian Medina is a 66 y.o. male with medical history significant for mild intellectual disability, schizoaffective disorder, depressive type, GERD, generalized anxiety disorder, COPD who resides in an adult group home and was brought into the ER by EMS for evaluation. Pt admitted for sepsis due to PNA. SLP spoke with staff member at group home to confirm that pt was on a regular solids and  thin liquids diet at baseline. Chest CT 09/24/22: "The appearance of the chest suggests probable multilobar bilateral bronchopneumonia, likely from atypical infection. This is apparently acute given the change between the September 24, 2022 chest x-ray and the prior chest x-ray from 02/26/2022. In addition to multiple old healed and healing bilateral rib fractures there are acute mildly displaced fractures of the posterolateral left ninth and tenth ribs. No pneumothorax at this time. Diffuse bronchial wall thickening with moderate centrilobular and paraseptal emphysema; imaging findings suggestive of underlying COPD. Aortic atherosclerosis, in addition to 2 vessel coronary artery disease. Please note that although the presence of coronary artery calcium documents the presence of coronary artery disease, the severity of this disease and any potential stenosis cannot be assessed on this non-gated CT examination. Assessment for potential risk factor modification, dietary therapy or pharmacologic therapy may be warranted, if clinically indicated. Cholelithiasis." Pt currently on a Dys 2 and thin liquids diet and room air.      SLP Plan  Goals updated;Consult other service (comment) (Palliative Care)      Recommendations for follow up therapy are one component of a multi-disciplinary discharge planning process, led by the attending physician.  Recommendations may be updated based on patient status, additional functional criteria and insurance authorization.    Recommendations  Diet recommendations: Dysphagia 2 (fine chop);Thin liquid (as ongoing) Liquids provided via: Teaspoon;Cup;No straw Medication Administration: Crushed with puree Supervision: Staff to assist with self feeding;Full supervision/cueing for compensatory strategies (for precautions follo through) Compensations: Minimize environmental distractions;Slow rate;Small sips/bites;Lingual sweep for clearance of pocketing;Multiple dry swallows after  each bite/sip;Follow solids with liquid;Clear throat intermittently;Hard cough after swallow Postural Changes and/or Swallow Maneuvers:  Out of bed for meals;Seated upright 90 degrees;Upright 30-60 min after meal                General recommendations:  (Palliative Care consult for [place]; Dietician f/u) Oral Care Recommendations: Oral care BID;Oral care before and after PO;Staff/trained caregiver to provide oral care Follow Up Recommendations: Follow physician's recommendations for discharge plan and follow up therapies Assistance recommended at discharge: Frequent or constant Supervision/Assistance (at meals) SLP Visit Diagnosis: Dysphagia, oropharyngeal phase (R13.12) (baseline deficits per previous MBSS) Plan: Goals updated;Consult other service (comment) (Palliative Care)             Ian Kenner, MS, [place] Speech Language Pathologist Rehab Services; [place] [phone] (ascom) Ian Medina  09/26/2022, 10:10 AM

## 2022-09-26 NOTE — Progress Notes (Signed)
Pt remains confused and impulsive. Given 2mg  IV Ativan for MRI. VSS. Current CIWA = 7.    09/26/22 1343  Vitals  Temp 98.2 F (36.8 C)  Temp Source Oral  BP 116/89  MAP (mmHg) 99  BP Location Right Arm  BP Method Automatic  Patient Position (if appropriate) Lying  Pulse Rate 92  Pulse Rate Source Monitor  Resp 16  Level of Consciousness  Level of Consciousness Alert  MEWS COLOR  MEWS Score Color Green  Oxygen Therapy  SpO2 98 %  O2 Device Room Air

## 2022-09-26 NOTE — Assessment & Plan Note (Signed)
Likely multifactorial fall precautions PT and OT recommends SNF

## 2022-09-26 NOTE — TOC Initial Note (Signed)
Transition of Care [facility]) - Initial/Assessment Note    Patient Details  Name: Ian Medina MRN: [id number] Date of Birth: [birth year]  [facility]) CM/SW Contact:    Beverly Sessions, RN Phone Number: 09/26/2022, 3:45 PM  Clinical Narrative:                      Admitted for: Sepsis Admitted from: [facility] [id number]   [place] with [facility].  She is to visit patient tomorrow, and bring paperwork.  She states that she has been his assigned case worker for approximately 5 years.    She states that normally patient is very active and independent with regular diet  Therapy recommending SNF.  She is in agreement.  First Choice would be [place]. Message sent to [facility] [place]  for review   Palliative care consult pending for goals of care  Fl2 sent for signature  Currently unable to access NCMUT due technical issues  Bed search initiated     Patient Goals and CMS Choice        Expected Discharge Plan and Services                                                Prior Living Arrangements/Services                       Activities of Daily Living      Permission Sought/Granted                  Emotional Assessment              Admission diagnosis:  Confusion [R41.0] Sepsis ([place]) [A41.9] Hypotension, unspecified hypotension type [I95.9] Community acquired pneumonia, unspecified laterality [J18.9] Patient Active Problem List   Diagnosis Date Noted   Sepsis ([place]) 09/25/2022   Sepsis due to pneumonia ([place]) 09/24/2022   Schizoaffective disorder, depressive type ([place])    Falls    Multiple rib fractures    Aggressive behavior 06/03/2022   Drug abuse ([place]) 08/23/2016   Cognitive decline 06/08/2014   Dysphagia 05/11/2014   Hepatitis C antibody test positive 03/12/2013   Encounter for screening colonoscopy 03/12/2013   PCP:  Carlos American, [place] [facility]:   [place], [place] - [address] [place] [address] Phone: [phone] Fax: [phone]     Social Determinants of Health (SDOH) Interventions    Readmission Risk Interventions     No data to display

## 2022-09-26 NOTE — Progress Notes (Signed)
Pt drowsy from PRN IV ativan; 1800 medication not given d/t aspiration risk.    09/26/22 1740  Vitals  BP 111/77  MAP (mmHg) 88  BP Location Right Arm  BP Method Automatic  Patient Position (if appropriate) Lying  Pulse Rate 65  Pulse Rate Source Monitor  Resp 14  MEWS COLOR  MEWS Score Color Green  Oxygen Therapy  SpO2 97 %  O2 Device Room Air  Pain Assessment  Pain Score Asleep

## 2022-09-26 NOTE — Assessment & Plan Note (Signed)
Status post falls with several healing and new acute rib fractures. Pain is well controlled on current regimen. Incentive spirometry while awake

## 2022-09-26 NOTE — Assessment & Plan Note (Signed)
Appreciate ST evaluation.  Recommends continuing thin liquids with dysphagia 2 with strict aspiration precaution.  He was found to have moderate pharyngeal deficit

## 2022-09-26 NOTE — Progress Notes (Signed)
  Progress Note   Patient: Ian Medina [id number] DOB: 10-Jul-1956 DOA: 09/24/2022     2 DOS: the patient was seen and examined on 09/26/2022   Brief hospital course: 66 y.o. male with medical history significant for mild intellectual disability, schizoaffective disorder, depressive type, GERD, generalized anxiety disorder, COPD who resides in an adult group home was brought into the ER by EMS for falls,, altered mental status and poor p.o. intake.  Found to have pneumonia and multiple rib fractures  10/30: Continue antibiotic.  PT, OT eval 10/31: PT, OT recommends SNF.  MRI brain negative for acute pathology.  TOC looking for placement   Assessment and Plan: * Sepsis due to pneumonia ([place]) As evidenced by tachycardia, tachypnea, hypotension that responded to IV fluid resuscitation. Labs show markedly elevated lactic acid levels Chest x-ray shows findings suggestive of multifocal pneumonia Continue aggressive IV fluid resuscitation lactic acid levels normalized with hydration.  Procalcitonin within normal limits Continue empiric Rocephin and Zithromax for now    Cognitive decline Seems confused and disoriented.  MRI brain negative for any acute pathology.  Unsure baseline mental status  Multiple rib fractures Status post falls with several healing and new acute rib fractures. Pain is well controlled on current regimen. Incentive spirometry while awake  Falls Likely multifactorial fall precautions PT and OT recommends SNF  Schizoaffective disorder, depressive type ([place]) Continue Thorazine, trazodone and Prozac.  Dysphagia Appreciate ST evaluation.  Recommends continuing thin liquids with dysphagia 2 with strict aspiration precaution.  He was found to have moderate pharyngeal deficit          Subjective: Did not want to talk.  Kept himself covered with bedsheet.  No new issues overnight.  Some agitation reported by nursing  Physical Exam: Vitals:   09/26/22 0109  09/26/22 0444 09/26/22 0803 09/26/22 1343  BP: 111/83 124/70 107/67 116/89  Pulse: 85 85 61 92  Resp:  18 16 16   Temp:  (!) 97.5 F (36.4 C) 98.1 F (36.7 C) 98.2 F (36.8 C)  TempSrc:  Oral Oral Oral  SpO2:  93% 98% 98%  Weight:      Height:       66 year old dishelved male lying in the bed comfortably without any acute distress Lungs clear to auscultation bilaterally.  Did not find any tender chest wall points on exam Cardiovascular regular rate and rhythm Abdomen soft, benign Neuro sleepy today, nonfocal.  Seems disoriented and confused Psych: Impulsive Data Reviewed:  MRI brain showed no acute pathology  Family Communication: Updated patient's guardian [name] over phone at [phone]   Disposition: Status is: Inpatient Remains inpatient appropriate because: Treatment of pneumonia and sepsis   Planned Discharge Destination: Skilled nursing facility    DVT prophylaxis-Lovenox Time spent: 35 minutes  Author: Max Sane, MD 09/26/2022 4:49 PM  For on call review [email or web address].

## 2022-09-26 NOTE — Assessment & Plan Note (Signed)
Seems confused and disoriented.  MRI brain negative for any acute pathology.  Unsure baseline mental status

## 2022-09-27 LAB — BASIC METABOLIC PANEL
Anion gap: 6 (ref 5–15)
BUN: <5 mg/dL — ABNORMAL LOW (ref 8–23)
CO2: 28 mmol/L (ref 22–32)
Calcium: 8.7 mg/dL — ABNORMAL LOW (ref 8.9–10.3)
Chloride: 106 mmol/L (ref 98–111)
Creatinine, Ser: 0.75 mg/dL (ref 0.61–1.24)
GFR, Estimated: >60 mL/min (ref >60)
Glucose, Bld: 91 mg/dL (ref 70–99)
Potassium: 4.3 mmol/L (ref 3.5–5.1)
Sodium: 140 mmol/L (ref 135–145)

## 2022-09-27 LAB — CBC
HCT: 39.5 % (ref 39.0–52.0)
Hemoglobin: 12.6 g/dL — ABNORMAL LOW (ref 13.0–17.0)
MCH: 30.2 pg (ref 26.0–34.0)
MCHC: 31.9 g/dL (ref 30.0–36.0)
MCV: 94.7 fL (ref 80.0–100.0)
Platelets: 223 10*3/uL (ref 150–400)
RBC: 4.17 MIL/uL — ABNORMAL LOW (ref 4.22–5.81)
RDW: 15.8 % — ABNORMAL HIGH (ref 11.5–15.5)
WBC: 7.9 10*3/uL (ref 4.0–10.5)
nRBC: 0 % (ref 0.0–0.2)

## 2022-09-27 MED ORDER — GABAPENTIN 100 MG PO CAPS
100.0000 mg | ORAL_CAPSULE | Freq: Three times a day (TID) | ORAL | Status: DC
  Administered 2022-09-27 – 2022-09-28 (×4): 100 mg via ORAL
  Filled 2022-09-27 (×4): qty 1

## 2022-09-27 MED ORDER — VALBENAZINE TOSYLATE 40 MG PO CAPS
40.0000 mg | ORAL_CAPSULE | Freq: Every day | ORAL | Status: DC
  Administered 2022-09-27 – 2022-09-28 (×2): 40 mg via ORAL
  Filled 2022-09-27 (×2): qty 1

## 2022-09-27 MED ORDER — UMECLIDINIUM BROMIDE 62.5 MCG/ACT IN AEPB
1.0000 | INHALATION_SPRAY | Freq: Every day | RESPIRATORY_TRACT | Status: DC
  Administered 2022-09-27 – 2022-09-28 (×2): 1 via RESPIRATORY_TRACT
  Filled 2022-09-27: qty 7

## 2022-09-27 MED ORDER — AZITHROMYCIN 250 MG PO TABS
500.0000 mg | ORAL_TABLET | Freq: Every day | ORAL | Status: AC
  Administered 2022-09-27 – 2022-09-28 (×2): 500 mg via ORAL
  Filled 2022-09-27 (×2): qty 2

## 2022-09-27 MED ORDER — ASPIRIN 81 MG PO CHEW
81.0000 mg | CHEWABLE_TABLET | Freq: Every day | ORAL | Status: DC
  Administered 2022-09-27 – 2022-09-28 (×2): 81 mg via ORAL
  Filled 2022-09-27 (×2): qty 1

## 2022-09-27 MED ORDER — FLUTICASONE FUROATE-VILANTEROL 100-25 MCG/ACT IN AEPB
1.0000 | INHALATION_SPRAY | Freq: Every day | RESPIRATORY_TRACT | Status: DC
  Administered 2022-09-27 – 2022-09-28 (×2): 1 via RESPIRATORY_TRACT
  Filled 2022-09-27: qty 28

## 2022-09-27 MED ORDER — LACTATED RINGERS IV BOLUS
1000.0000 mL | Freq: Once | INTRAVENOUS | Status: AC
  Administered 2022-09-27: 1000 mL via INTRAVENOUS

## 2022-09-27 MED ORDER — BENZTROPINE MESYLATE 0.5 MG PO TABS
1.0000 mg | ORAL_TABLET | Freq: Three times a day (TID) | ORAL | Status: DC
  Administered 2022-09-27 – 2022-09-28 (×4): 1 mg via ORAL
  Filled 2022-09-27 (×4): qty 2

## 2022-09-27 NOTE — Progress Notes (Signed)
Physical Therapy Treatment Patient Details Name: Ian Medina MRN: [id number] DOB: 1956/04/22 Today's Date: 09/27/2022   History of Present Illness Pt is a 66 y.o. male with medical history significant for mild intellectual disability, schizoaffective disorder, depressive type, GERD, generalized anxiety disorder, COPD who resides in an adult group home and was brought into the ER by EMS for evaluation. MD assessment includes: Sepsis due to pneumonia, multiple rib fractures, dysphagia, and falls.    PT Comments    Pt confused and impulsive needed frequent cuing for general sequencing during the session.  Pt required some physical assist with all functional tasks but grossly improved from prior session. Pt was able to amb 30 feet with a RW with poor carryover of proper sequencing especially regarding amb close to the RW and to remain within the RW during turns. Pt is at a very high risk for falls. Pt reported no adverse symptoms during the session.  Pt will benefit from PT services in a SNF setting upon discharge to safely address deficits listed in patient problem list for decreased caregiver assistance and eventual return to PLOF.   Recommendations for follow up therapy are one component of a multi-disciplinary discharge planning process, led by the attending physician.  Recommendations may be updated based on patient status, additional functional criteria and insurance authorization.  Follow Up Recommendations  Skilled nursing-short term rehab (<3 hours/day) Can patient physically be transported by private vehicle: No   Assistance Recommended at Discharge Frequent or constant Supervision/Assistance  Patient can return home with the following A lot of help with walking and/or transfers;A lot of help with bathing/dressing/bathroom;Assistance with cooking/housework;Direct supervision/assist for medications management;Direct supervision/assist for financial management;Help with stairs or ramp for  entrance;Assist for transportation   Equipment Recommendations  Other (comment) (TBD at next venue of care)    Recommendations for Other Services       Precautions / Restrictions Precautions Precautions: Fall Restrictions Weight Bearing Restrictions: No Other Position/Activity Restrictions: Impulsive, +2 for safety     Mobility  Bed Mobility Overal bed mobility: Needs Assistance       Supine to sit: Min assist Sit to supine: Supervision   General bed mobility comments: Impulsive with bed mobility tasks with min A for BLE control during sup to sit and entered bed on hands and knees then rolled to sidelying    Transfers Overall transfer level: Needs assistance Equipment used: 2 person hand held assist Transfers: Sit to/from Stand Sit to Stand: Min assist           General transfer comment: Min A for stability during transfer training    Ambulation/Gait Ambulation/Gait assistance: Min assist Gait Distance (Feet): 30 Feet Assistive device: Rolling walker (2 wheels) Gait Pattern/deviations: Step-through pattern, Decreased step length - right, Decreased step length - left, Trunk flexed, Narrow base of support Gait velocity: decreased     General Gait Details: Min A for stability and to guide the RW with mod multi-modal cues for safe sequencing with the RW especially during turns   Optometrist    Modified Rankin (Stroke Patients Only)       Balance Overall balance assessment: Needs assistance Sitting-balance support: No upper extremity supported, Feet supported Sitting balance-Leahy Scale: Fair     Standing balance support: During functional activity, Bilateral upper extremity supported Standing balance-Leahy Scale: Poor  Cognition Arousal/Alertness: Awake/alert Behavior During Therapy: Impulsive, Restless Overall Cognitive Status: No family/caregiver present to determine  baseline cognitive functioning                                          Exercises      General Comments        Pertinent Vitals/Pain Pain Assessment Pain Assessment: PAINAD Breathing: normal Negative Vocalization: none Facial Expression: smiling or inexpressive Body Language: relaxed Consolability: no need to console PAINAD Score: 0    Home Living                          Prior Function            PT Goals (current goals can now be found in the care plan section) Progress towards PT goals: Progressing toward goals    Frequency    Min 2X/week      PT Plan Current plan remains appropriate    Co-evaluation              AM-PAC PT "6 Clicks" Mobility   Outcome Measure  Help needed turning from your back to your side while in a flat bed without using bedrails?: A Little Help needed moving from lying on your back to sitting on the side of a flat bed without using bedrails?: A Little Help needed moving to and from a bed to a chair (including a wheelchair)?: A Little Help needed standing up from a chair using your arms (e.g., wheelchair or bedside chair)?: A Little Help needed to walk in hospital room?: A Little Help needed climbing 3-5 steps with a railing? : A Lot 6 Click Score: 17    End of Session Equipment Utilized During Treatment: Gait belt Activity Tolerance: Patient tolerated treatment well Patient left: in bed;with call bell/phone within reach;with bed alarm set Nurse Communication: Mobility status PT Visit Diagnosis: Unsteadiness on feet (R26.81);Difficulty in walking, not elsewhere classified (R26.2);Muscle weakness (generalized) (M62.81)     Time: 2956-2130 PT Time Calculation (min) (ACUTE ONLY): 12 min  Charges:  $Gait Training: 8-22 mins                    D. Scott Paisli Silfies PT, DPT 09/27/22, 3:09 PM

## 2022-09-27 NOTE — Progress Notes (Signed)
PROGRESS NOTE    Ian Medina  FUX:[id number] DOB: [birth year] DOA: 09/24/2022 PCP: Duffy Rhody, FNP    Brief Narrative:   66 y.o. male with medical history significant for mild intellectual disability, schizoaffective disorder, depressive type, GERD, generalized anxiety disorder, COPD who resides in an adult group home was brought into the ER by EMS for falls,, altered mental status and poor p.o. intake.  Found to have pneumonia and multiple rib fractures   10/30: Continue antibiotic.  PT, OT eval 10/31: PT, OT recommends SNF.  MRI brain negative for acute pathology.  TOC looking for placement 11/1: Seen and evaluated.  Mental status slowly improving.  Assessment & Plan:   Principal Problem:   Sepsis due to pneumonia [facility]) Active Problems:   Dysphagia   Schizoaffective disorder, depressive type (HCC)   Falls   Multiple rib fractures   Sepsis (HCC)   Cognitive decline  * Sepsis due to pneumonia (HCC) As evidenced by tachycardia, tachypnea, hypotension that responded to IV fluid resuscitation. Labs show markedly elevated lactic acid levels Chest x-ray shows findings suggestive of multifocal pneumonia Plan: DC IVF Continue Rocephin and azithromycin for now Last dose 11/2    Cognitive decline Acute metabolic encephalopathy Seems confused and disoriented.  MRI brain negative for any acute pathology.  Per ALF representative at bedside on 11/1 the patient is not at his mental baseline.  SPECT infectious related delirium.  Continue antibiotics as above.  Frequent reorienting measures.  Avoid nonessential sedatives and anticholinergics   Multiple rib fractures Status post falls with several healing and new acute rib fractures. Pain is well controlled on current regimen. Incentive spirometry while awake   Falls Likely multifactorial fall precautions PT and OT recommends SNF TOC consulted for discharge planning   Schizoaffective disorder, depressive type  (HCC) Continue Thorazine, trazodone and Prozac.   Dysphagia Appreciate ST evaluation.  Recommends continuing thin liquids with dysphagia 2 with strict aspiration precaution.  He was found to have moderate pharyngeal deficit  DVT prophylaxis: SQ Lovenox Code Status: Full Family Communication: ALF staff member at bedside 11/1 Disposition Plan: Status is: Inpatient Remains inpatient appropriate because: Acute metabolic encephalopathy secondary to pneumonia/sepsis.  Possible medical readiness for discharge in 1 to 2 days.   Level of care: Med-Surg  Consultants:  None  Procedures:  None  Antimicrobials: Rocephin Zithromycin   Subjective: Seen and examined.  Slightly confused.  Sitting up in bed.  No visible distress.  Objective: Vitals:   09/27/22 0000 09/27/22 0219 09/27/22 0418 09/27/22 0939  BP: (!) 82/56 110/84 117/76 95/84  Pulse: 67 75 70 98  Resp:   18 18  Temp:   98 F (36.7 C) (!) 97.5 F (36.4 C)  TempSrc:   Axillary   SpO2:   96% 96%  Weight:      Height:        Intake/Output Summary (Last 24 hours) at 09/27/2022 1425 Last data filed at 09/27/2022 0422 Gross per 24 hour  Intake 1337.99 ml  Output 450 ml  Net 887.99 ml   Filed Weights   09/24/22 1106  Weight: 68 kg    Examination:  General exam: Appears calm and comfortable  Respiratory system: Bibasilar crackles.  Normal work of breathing.  Room air Cardiovascular system: S2, RRR, no murmurs, no pedal edema Gastrointestinal system: Thin, soft, NT/ND, normal bowel sounds Central nervous system: Alert.  Oriented x1.  No focal deficits Extremities: Symmetric 5 x 5 power. Skin: No rashes, lesions or ulcers Psychiatry: Judgement and insight  appear impaired. Mood & affect flattened.     Data Reviewed: I have personally reviewed following labs and imaging studies  CBC: Recent Labs  Lab 09/24/22 1126 09/25/22 0615 09/26/22 0516 09/27/22 0653  WBC 10.0 5.5 6.0 7.9  NEUTROABS 6.2  --   --   --    HGB 13.7 12.1* 12.3* 12.6*  HCT 42.9 36.8* 38.5* 39.5  MCV 94.3 92.9 95.8 94.7  PLT 279 220 198 223   Basic Metabolic Panel: Recent Labs  Lab 09/24/22 1126 09/25/22 0615 09/26/22 0516 09/27/22 0653  NA 136 142 142 140  K 3.6 3.4* 3.6 4.3  CL 99 106 108 106  CO2 22 27 27 28   GLUCOSE 103* 97 96 91  BUN 11 6* <5* <5*  CREATININE 0.95 0.74 0.61 0.75  CALCIUM 8.9 8.4* 8.4* 8.7*  MG  --  1.7  --   --   PHOS  --  4.0  --   --    GFR: Estimated Creatinine Clearance: 87.2 mL/min (by C-G formula based on SCr of 0.75 mg/dL). Liver Function Tests: Recent Labs  Lab 09/24/22 1126  AST 35  ALT 14  ALKPHOS 73  BILITOT 1.1  PROT 7.7  ALBUMIN 3.3*   No results for input(s): "LIPASE", "AMYLASE" in the last 168 hours. No results for input(s): "AMMONIA" in the last 168 hours. Coagulation Profile: Recent Labs  Lab 09/24/22 1126 09/25/22 0615  INR 1.2 1.3*   Cardiac Enzymes: No results for input(s): "CKTOTAL", "CKMB", "CKMBINDEX", "TROPONINI" in the last 168 hours. BNP (last 3 results) No results for input(s): "PROBNP" in the last 8760 hours. HbA1C: No results for input(s): "HGBA1C" in the last 72 hours. CBG: No results for input(s): "GLUCAP" in the last 168 hours. Lipid Profile: No results for input(s): "CHOL", "HDL", "LDLCALC", "TRIG", "CHOLHDL", "LDLDIRECT" in the last 72 hours. Thyroid Function Tests: Recent Labs    09/25/22 1724  TSH 2.267   Anemia Panel: Recent Labs    09/25/22 1724  VITAMINB12 619  FOLATE 8.0   Sepsis Labs: Recent Labs  Lab 09/24/22 1326 09/24/22 1329 09/24/22 1800 09/25/22 0615  PROCALCITON  --   --   --  <0.10  LATICACIDVEN 5.4* 2.2* 1.9  --     Recent Results (from the past 240 hour(s))  Resp Panel by RT-PCR (Flu A&B, Covid) Anterior Nasal Swab     Status: None   Collection Time: 09/24/22 11:26 AM   Specimen: Anterior Nasal Swab  Result Value Ref Range Status   SARS Coronavirus 2 by RT PCR NEGATIVE NEGATIVE Final    Comment:  (NOTE) SARS-CoV-2 target nucleic acids are NOT DETECTED.  The SARS-CoV-2 RNA is generally detectable in upper respiratory specimens during the acute phase of infection. The lowest concentration of SARS-CoV-2 viral copies this assay can detect is 138 copies/mL. A negative result does not preclude SARS-Cov-2 infection and should not be used as the sole basis for treatment or other patient management decisions. A negative result may occur with  improper specimen collection/handling, submission of specimen other than nasopharyngeal swab, presence of viral mutation(s) within the areas targeted by this assay, and inadequate number of viral copies(<138 copies/mL). A negative result must be combined with clinical observations, patient history, and epidemiological information. The expected result is Negative.  Fact Sheet for Patients:  09/26/22  Fact Sheet for Healthcare Providers:  BloggerCourse.com  This test is no t yet approved or cleared by the [place] FDA and  has been authorized for detection  and/or diagnosis of SARS-CoV-2 by FDA under an Emergency Use Authorization (EUA). This EUA will remain  in effect (meaning this test can be used) for the duration of the COVID-19 declaration under Section 564(b)(1) of the Act, 21 U.S.C.section 360bbb-3(b)(1), unless the authorization is terminated  or revoked sooner.       Influenza A by PCR NEGATIVE NEGATIVE Final   Influenza B by PCR NEGATIVE NEGATIVE Final    Comment: (NOTE) The Xpert Xpress SARS-CoV-2/FLU/RSV plus assay is intended as an aid in the diagnosis of influenza from Nasopharyngeal swab specimens and should not be used as a sole basis for treatment. Nasal washings and aspirates are unacceptable for Xpert Xpress SARS-CoV-2/FLU/RSV testing.  Fact Sheet for Patients: BloggerCourse.comhttps://www.fda.gov/media/152166/download  Fact Sheet for Healthcare  Providers: SeriousBroker.ithttps://www.fda.gov/media/152162/download  This test is not yet approved or cleared by the [place] FDA and has been authorized for detection and/or diagnosis of SARS-CoV-2 by FDA under an Emergency Use Authorization (EUA). This EUA will remain in effect (meaning this test can be used) for the duration of the COVID-19 declaration under Section 564(b)(1) of the Act, 21 U.S.C. section 360bbb-3(b)(1), unless the authorization is terminated or revoked.  Performed at [facility], [address] [place], [place] [address]   Blood Culture (routine x 2)     Status: None (Preliminary result)   Collection Time: 09/24/22 11:26 AM   Specimen: BLOOD  Result Value Ref Range Status   Specimen Description BLOOD BLOOD RIGHT WRIST  Final   Special Requests   Final    BOTTLES DRAWN AEROBIC AND ANAEROBIC Blood Culture results may not be optimal due to an inadequate volume of blood received in culture bottles   Culture   Final    NO GROWTH 3 DAYS Performed at [facility], [address] [place], [place] [address]    Report Status PENDING  Incomplete  Blood Culture (routine x 2)     Status: None (Preliminary result)   Collection Time: 09/24/22 11:26 AM   Specimen: BLOOD  Result Value Ref Range Status   Specimen Description BLOOD RIGHT ANTECUBITAL  Final   Special Requests   Final    BOTTLES DRAWN AEROBIC AND ANAEROBIC Blood Culture adequate volume   Culture   Final    NO GROWTH 3 DAYS Performed at [facility], [address] [place], [place] [address]    Report Status PENDING  Incomplete  Urine Culture     Status: Abnormal   Collection Time: 09/24/22 11:26 AM   Specimen: In/Out Cath Urine  Result Value Ref Range Status   Specimen Description   Final    IN/OUT CATH URINE Performed at [facility], [address] [place], [place] [address]    Special Requests   Final    NONE Performed at [facility], [address] [place], [place] [address]    Culture MULTIPLE SPECIES PRESENT, SUGGEST RECOLLECTION (A)  Final   Report Status 09/26/2022 FINAL  Final  Respiratory (~20 pathogens) panel by PCR     Status: None   Collection Time: 09/24/22  1:29 PM   Specimen: Nasopharyngeal Swab; Respiratory  Result Value Ref Range Status   Adenovirus NOT DETECTED NOT DETECTED Final   Coronavirus 229E NOT DETECTED NOT DETECTED Final    Comment: (NOTE) The Coronavirus on the Respiratory Panel, DOES NOT test for the novel  Coronavirus (2019 nCoV)    Coronavirus HKU1 NOT DETECTED NOT DETECTED Final   Coronavirus NL63 NOT DETECTED NOT  DETECTED Final   Coronavirus OC43 NOT DETECTED NOT DETECTED Final   Metapneumovirus NOT DETECTED NOT DETECTED Final   Rhinovirus / Enterovirus NOT DETECTED NOT DETECTED Final   Influenza A NOT DETECTED NOT DETECTED Final   Influenza B NOT DETECTED NOT DETECTED Final   Parainfluenza Virus 1 NOT DETECTED NOT DETECTED Final   Parainfluenza Virus 2 NOT DETECTED NOT DETECTED Final   Parainfluenza Virus 3 NOT DETECTED NOT DETECTED Final   Parainfluenza Virus 4 NOT DETECTED NOT DETECTED Final   Respiratory Syncytial Virus NOT DETECTED NOT DETECTED Final   Bordetella pertussis NOT DETECTED NOT DETECTED Final   Bordetella Parapertussis NOT DETECTED NOT DETECTED Final   Chlamydophila pneumoniae NOT DETECTED NOT DETECTED Final   Mycoplasma pneumoniae NOT DETECTED NOT DETECTED Final    Comment: Performed at [facility], [address], [place], [place] [address]         Radiology Studies: MR BRAIN WO CONTRAST  Result Date: 09/26/2022 CLINICAL DATA:  Hypotension, syncope. History of coil embolization of aneurysm EXAM: MRI HEAD WITHOUT CONTRAST TECHNIQUE: Multiplanar, multiecho pulse sequences of the brain and surrounding structures were obtained without intravenous contrast. COMPARISON:  MRI 06/03/2014 FINDINGS: Brain: There is no acute intracranial hemorrhage, extra-axial fluid  collection, or acute infarct. There is mild parenchymal volume loss with prominence of the ventricular system and extra-axial CSF spaces. There is encephalomalacia in the left anterior temporal with an area of susceptibility artifact likely reflecting postprocedural change related to prior aneurysm coiling. Scattered foci of FLAIR signal abnormality in the remainder of the supratentorial white matter are nonspecific but likely reflects sequela of mild chronic small-vessel ischemic change. Small foci of susceptibility artifact in the right frontal lobe are noted, possibly reflecting prior microhemorrhage or calcification, nonspecific. There is no mass lesion.  There is no mass effect or midline shift. Vascular: There is curvilinear DWI signal abnormality overlying the right parietal lobe with associated susceptibility artifact (4-37, 9-21). The flow voids of the major venous sinuses are normal. The major arterial flow voids are normal. Skull and upper cervical spine: Normal marrow signal. Sinuses/Orbits: There is moderate mucosal thickening in the right sphenoid sinus. The globes and orbits are unremarkable. Other: None. IMPRESSION: 1. Curvilinear diffusion restriction and susceptibility artifact overlying the right parietal lobe is indeterminate but could potentially reflect a small thrombosed cortical vein. 2. Otherwise, no evidence of acute intracranial pathology. 3. 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 Electronically Signed   By: Lesia Hausen M.D.   On: 09/26/2022 14:59   DG Abd 1 View  Result Date: 09/25/2022 CLINICAL DATA:  MRI clearance. EXAM: PELVIS - 1-2 VIEW; ABDOMEN - 1 VIEW COMPARISON:  None Available. FINDINGS: Pelvis: There is no radiopaque foreign body identified. Bowel-gas pattern is nonobstructive. There are vascular calcifications in the pelvis. No acute fractures are seen. Hyperdense material seen throughout small  bowel and colon. Abdomen: There is no radiopaque foreign body identified. There is hyperdense material throughout small bowel and colon. Bowel-gas pattern is nonobstructive. There are vascular calcifications in the pelvis. No suspicious calcifications overlie the kidneys. Lung bases are clear. Wires overlie the lower chest. No acute fractures. IMPRESSION: 1. No radiopaque foreign body identified in the pelvis or abdomen. 2. Hyperdense material throughout small bowel and colon. No bowel obstruction. 3. Vascular calcifications. Electronically Signed   By: Darliss Cheney M.D.   On: 09/25/2022 23:40   DG Pelvis 1-2 Views  Result Date: 09/25/2022 CLINICAL DATA:  MRI clearance. EXAM: PELVIS - 1-2 VIEW; ABDOMEN - 1 VIEW COMPARISON:  None Available. FINDINGS: Pelvis: There is no radiopaque foreign body identified. Bowel-gas pattern is nonobstructive. There are vascular calcifications in the pelvis. No acute fractures are seen. Hyperdense material seen throughout small bowel and colon. Abdomen: There is no radiopaque foreign body identified. There is hyperdense material throughout small bowel and colon. Bowel-gas pattern is nonobstructive. There are vascular calcifications in the pelvis. No suspicious calcifications overlie the kidneys. Lung bases are clear. Wires overlie the lower chest. No acute fractures. IMPRESSION: 1. No radiopaque foreign body identified in the pelvis or abdomen. 2. Hyperdense material throughout small bowel and colon. No bowel obstruction. 3. Vascular calcifications. Electronically Signed   By: Ronney Asters M.D.   On: 09/25/2022 23:40   DG Skull 1-3 Views  Result Date: 09/25/2022 CLINICAL DATA:  Initial evaluation for soft tissue foreign body. EXAM: SKULL - 1-3 VIEW COMPARISON:  Prior MRI from 06/03/2014. FINDINGS: Sequelae of prior coil embolization of presumed left MCA aneurysm noted. No other radiopaque foreign body seen about the scalp or orbital soft tissues. Visualized osseous  structures within normal limits with no discrete or worrisome osseous lesions. Visualized paranasal sinuses are well pneumatized and clear. IMPRESSION: 1. No radiopaque foreign body identified about the scalp or orbital soft tissues. 2. 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 Electronically Signed   By: Jeannine Boga M.D.   On: 09/25/2022 19:54        Scheduled Meds:  aspirin  81 mg Oral Daily   azithromycin  500 mg Oral Daily   benztropine  1 mg Oral TID   chlorproMAZINE  50 mg Oral QHS   cyanocobalamin  1,000 mcg Oral Daily   enoxaparin (LOVENOX) injection  40 mg Subcutaneous Q24H   FLUoxetine  20 mg Oral Daily   fluticasone furoate-vilanterol  1 puff Inhalation Daily   And   umeclidinium bromide  1 puff Inhalation Daily   folic acid  1 mg Oral Daily   gabapentin  100 mg Oral TID   lidocaine  1 patch Transdermal Q24H   montelukast  10 mg Oral QHS   multivitamin with minerals  1 tablet Oral Daily   OLANZapine  20 mg Oral QHS   mouth rinse  15 mL Mouth Rinse 4 times per day   pantoprazole  40 mg Oral Daily   pravastatin  20 mg Oral q1800   thiamine  100 mg Oral Daily   Or   thiamine  100 mg Intravenous Daily   traZODone  50 mg Oral QHS   valbenazine  40 mg Oral Daily   Continuous Infusions:  cefTRIAXone (ROCEPHIN)  IV 2 g (09/27/22 1139)     LOS: 3 days     Sidney Ace, MD [facility]   If 7PM-7AM, please contact night-coverage  09/27/2022, 2:25 PM

## 2022-09-27 NOTE — TOC Progression Note (Signed)
Transition of Care [facility]) - Progression Note    Patient Details  Name: Ian Medina MRN: [id number] Date of Birth: 05/12/1956  Transition of Care [facility]) CM/SW Contact  Beverly Sessions, RN Phone Number: 09/27/2022, 10:45 AM  Clinical Narrative:      Provided [name] legal guardian bed offer at [facility].  No other bed offers at this time.  She would like to accept offer at [facility].  Accepted in [place] and notified [name] at [facility].   Per MD anticipated patient will be medically ready for discharge 1-2 days  [facility] with TOC to start auth in [place] portal  Confirmed with [name] that [place] resources  are not indicated.  That patient has a past history       Expected Discharge Plan and Services                                                 Social Determinants of Health (SDOH) Interventions    Readmission Risk Interventions     No data to display

## 2022-09-27 NOTE — Progress Notes (Signed)
Occupational Therapy Treatment Patient Details Name: Ian Medina MRN: [id number] DOB: [birth year] Today's Date: 09/27/2022   History of present illness Pt is a 66 y.o. male with medical history significant for mild intellectual disability, schizoaffective disorder, depressive type, GERD, generalized anxiety disorder, COPD who resides in an adult group home and was brought into the ER by EMS for evaluation. MD assessment includes: Sepsis due to pneumonia, multiple rib fractures, dysphagia, and falls.   OT comments  Mr Lubell was seen for OT treatment on this date. Upon arrival to room pt reclined in bed, agreeable to tx. Pt requires MOD A for ADL t/f tolerates ~20 ft x3 with assist for multiple lateral LOBs. SETUP & SUPERVISION self-drinking at bed level, cues for safety, B tremor noted (reports baseline). ALF representative in to assess pt - reports baseline pt ambulatory without AD, endorses falls hx, MIN A for ADLs. Reports pt not at cognitive baseline - poor safety awareness. Pt making good progress toward goals, will continue to follow POC. Discharge recommendation remains appropriate.     Recommendations for follow up therapy are one component of a multi-disciplinary discharge planning process, led by the attending physician.  Recommendations may be updated based on patient status, additional functional criteria and insurance authorization.    Follow Up Recommendations  Skilled nursing-short term rehab (<3 hours/day)    Assistance Recommended at Discharge Frequent or constant Supervision/Assistance  Patient can return home with the following  A lot of help with walking and/or transfers;A lot of help with bathing/dressing/bathroom   Equipment Recommendations  BSC/3in1    Recommendations for Other Services      Precautions / Restrictions Precautions Precautions: Fall Restrictions Weight Bearing Restrictions: No       Mobility Bed Mobility Overal bed mobility: Needs  Assistance Bed Mobility: Supine to Sit, Sit to Supine     Supine to sit: Supervision Sit to supine: Supervision        Transfers Overall transfer level: Needs assistance Equipment used: None Transfers: Sit to/from Stand Sit to Stand: Min assist                 Balance Overall balance assessment: Needs assistance Sitting-balance support: No upper extremity supported, Feet supported Sitting balance-Leahy Scale: Fair     Standing balance support: No upper extremity supported, During functional activity Standing balance-Leahy Scale: Poor                             ADL either performed or assessed with clinical judgement   ADL Overall ADL's : Needs assistance/impaired                                       General ADL Comments: MOD A for ADL t/f tolerates ~20 ft x3 with assist for multiple lateral LOBs. SETUP & SUPERVISION self-drinking at bed level, cues for safety, B tremor noted (reports baseline)    Extremity/Trunk Assessment Upper Extremity Assessment Upper Extremity Assessment: Generalized weakness   Lower Extremity Assessment Lower Extremity Assessment: Generalized weakness         Cognition Arousal/Alertness: Awake/alert Behavior During Therapy: Impulsive Overall Cognitive Status: Impaired/Different from baseline Area of Impairment: Orientation, Following commands, Safety/judgement                 Orientation Level: Disoriented to, Place   Memory: Decreased recall of precautions, Decreased short-term memory Following  Commands: Follows one step commands inconsistently Safety/Judgement: Decreased awareness of safety, Decreased awareness of deficits                         Pertinent Vitals/ Pain       Pain Assessment Pain Assessment: No/denies pain   Frequency  Min 2X/week        Progress Toward Goals  OT Goals(current goals can now be found in the care plan section)  Progress towards OT goals:  Progressing toward goals  Acute Rehab OT Goals Patient Stated Goal: to go home OT Goal Formulation: With patient Time For Goal Achievement: 10/10/22 Potential to Achieve Goals: Fair ADL Goals Pt Will Perform Grooming: with modified independence;standing Pt Will Perform Lower Body Dressing: with modified independence;sit to/from stand Pt Will Transfer to Toilet: with modified independence;ambulating;regular height toilet  Plan Discharge plan remains appropriate;Frequency remains appropriate    Co-evaluation                 AM-PAC OT "6 Clicks" Daily Activity     Outcome Measure   Help from another person eating meals?: A Little Help from another person taking care of personal grooming?: A Lot Help from another person toileting, which includes using toliet, bedpan, or urinal?: A Lot Help from another person bathing (including washing, rinsing, drying)?: A Lot Help from another person to put on and taking off regular upper body clothing?: A Little Help from another person to put on and taking off regular lower body clothing?: A Little 6 Click Score: 15    End of Session    OT Visit Diagnosis: Other abnormalities of gait and mobility (R26.89);Muscle weakness (generalized) (M62.81);History of falling (Z91.81)   Activity Tolerance Patient tolerated treatment well   Patient Left in bed;with call bell/phone within reach;with bed alarm set;with family/visitor present   Nurse Communication          Time: 0981-1914 OT Time Calculation (min): 22 min  Charges: OT General Charges $OT Visit: 1 Visit OT Treatments $Therapeutic Activity: 8-22 mins  Dessie Coma, M.S. OTR/L  09/27/22, 1:27 PM  ascom [phone]

## 2022-09-27 NOTE — Progress Notes (Signed)
       CROSS COVER NOTE  NAME: Benjimen Kelley MRN: [id number] DOB : [birth year] ATTENDING PHYSICIAN: Max Sane, MD    Date of Service   09/27/2022   HPI/Events of Note   Notified of hypotension --> BP 82/56.  Interventions   Assessment/Plan:  1L LR bolus     This document was prepared using Dragon voice recognition software and may include unintentional dictation errors.  Neomia Glass DNP, MBA, FNP-BC Nurse Practitioner [facility] Pager [phone]

## 2022-09-27 NOTE — Progress Notes (Signed)
Mobility Specialist - Progress Note   09/27/22 1100  Mobility  Activity Ambulated with assistance in room;Stood at bedside  Level of Assistance Minimal assist, patient does 75% or more  Assistive Device None;Other (Comment)  Distance Ambulated (ft) 15 ft  Activity Response Tolerated fair  $Mobility charge 1 Mobility     Mobility responded to bed alarm. Pt standing at bedside upon arrival, without AD, and very unsteady. LOB x2 while upright, corrected by minA. Pt begins urinating on floor. RN in room able to assist with cleaning and hygiene. Pt sat in chair during linen change while bed sheets were changed. Pt very impulsive and sits before safely positioning self; assist from author to guide pt into chair to prevent fall twice during session. Pt provided bath and new gown, ambulated back to bed again with HHA and assistance to guide into seated position. Pt left in bed with alarm set, needs in reach.    Kathee Delton Mobility Specialist 09/27/22, 12:07 PM

## 2022-09-27 NOTE — TOC Progression Note (Signed)
Transition of Care [facility]) - Progression Note    Patient Details  Name: Ian Medina MRN: [id number] Date of Birth: 1955/12/20  [facility] CM/SW Contact  Beverly Sessions, RN Phone Number: 09/27/2022, 10:20 AM  Clinical Narrative:     [name] [phone]       Expected Discharge Plan and Services                                                 Social Determinants of Health (SDOH) Interventions    Readmission Risk Interventions     No data to display

## 2022-09-27 NOTE — Plan of Care (Signed)

## 2022-09-27 NOTE — Care Management Important Message (Signed)
Important Message  Patient Details  Name: Dirk Vanaman MRN: [id number] Date of Birth: [birth year]   Medicare Important Message Given:  N/A - LOS <3 / Initial given by admissions     Dannette Barbara 09/27/2022, 8:26 AM

## 2022-09-27 NOTE — Progress Notes (Signed)
PHARMACIST - PHYSICIAN COMMUNICATION  CONCERNING: Antibiotic IV to Oral Route Change Policy  RECOMMENDATION: This patient is receiving azithromycin by the intravenous route.  Based on criteria approved by the Pharmacy and Therapeutics Committee, the antibiotic(s) is/are being converted to the equivalent oral dose form(s).   DESCRIPTION: These criteria include: Patient being treated for a respiratory tract infection, urinary tract infection, cellulitis or clostridium difficile associated diarrhea if on metronidazole The patient is not neutropenic and does not exhibit a GI malabsorption state The patient is eating (either orally or via tube) and/or has been taking other orally administered medications for a least 24 hours The patient is improving clinically and has a Tmax < 100.5  If you have questions about this conversion, please contact the Pharmacy Department   Ian Medina 09/27/22    

## 2022-09-28 NOTE — TOC Transition Note (Signed)
Transition of Care [facility]) - CM/SW Discharge Note   Patient Details  Name: Ian Medina MRN: [id number] Date of Birth: August 26, 1956  Transition of Care [facility]) CM/SW Contact:  Beverly Sessions, RN Phone Number: 09/28/2022, 10:24 AM   Clinical Narrative:     Insurance auth obtained       Patient will [facility] to[facility] Anticipated DC date:09/28/22  Family notified:VM left for gaurdian Tour manager by: Johnanna Schneiders  Per MD patient ready for DC to . RN, guardian, and facility notified of DC. Discharge Summary sent to facility. RN given number for report. DC packet on chart. Ambulance transport requested for patient.  TOC signing off.  Isaias Cowman [facility] [phone]    Patient Goals and CMS Choice        Discharge Placement                       Discharge Plan and Services                                     Social Determinants of Health (SDOH) Interventions     Readmission Risk Interventions     No data to display

## 2022-09-28 NOTE — Discharge Summary (Signed)
Physician Discharge Summary  Ian Medina JOA:[id number] DOB: 05/07/1956 DOA: 09/24/2022  PCP: Duffy Rhody, FNP  Admit date: 09/24/2022 Discharge date: 09/28/2022  Admitted From: ALF Disposition:  SNF  Recommendations for Outpatient Follow-up:  Follow up with PCP in 1-2 weeks   Home Health:No Equipment/Devices:None  Discharge Condition:Stable  CODE STATUS:FULL Diet recommendation: Dysphagia 2  Brief/Interim Summary:  66 y.o. male with medical history significant for mild intellectual disability, schizoaffective disorder, depressive type, GERD, generalized anxiety disorder, COPD who resides in an adult group home was brought into the ER by EMS for falls,, altered mental status and poor p.o. intake.  Found to have pneumonia and multiple rib fractures   10/30: Continue antibiotic.  PT, OT eval 10/31: PT, OT recommends SNF.  MRI brain negative for acute pathology.  TOC looking for placement 11/1: Seen and evaluated.  Mental status slowly improving.  11/2: Mentating clearly.  Will complete last dose of antibiotic in house.  DC to SNF   Discharge Diagnoses:  Principal Problem:   Sepsis due to pneumonia [facility]) Active Problems:   Dysphagia   Schizoaffective disorder, depressive type (HCC)   Falls   Multiple rib fractures   Sepsis (HCC)   Cognitive decline  Sepsis due to pneumonia (HCC) As evidenced by tachycardia, tachypnea, hypotension that responded to IV fluid resuscitation. Labs show markedly elevated lactic acid levels Chest x-ray shows findings suggestive of multifocal pneumonia Plan: Completed course of IV rocephin/azithro in hospital Last dose 11/2 DC to SNF     Cognitive decline Acute metabolic encephalopathy Seems confused and disoriented.  MRI brain negative for any acute pathology. Mental status at or near baseline at time of dc   Multiple rib fractures Status post falls with several healing and new acute rib fractures. Pain is well controlled on  current regimen. Incentive spirometry while awake   Falls Likely multifactorial fall precautions PT and OT recommends SNF   Schizoaffective disorder, depressive type (HCC) Continue Thorazine, trazodone and Prozac.   Dysphagia Appreciate ST evaluation.  Recommends continuing thin liquids with dysphagia 2 with strict aspiration precaution.  He was found to have moderate pharyngeal deficit  Discharge Instructions  Discharge Instructions     Diet - low sodium heart healthy   Complete by: As directed    Increase activity slowly   Complete by: As directed       Allergies as of 09/28/2022       Reactions   Propoxyphene Rash        Medication List     TAKE these medications    albuterol 108 (90 Base) MCG/ACT inhaler Commonly known as: VENTOLIN HFA Inhale 2 puffs into the lungs every 6 (six) hours as needed for wheezing or shortness of breath.   aspirin 81 MG chewable tablet Chew 81 mg by mouth daily.   benztropine 1 MG tablet Commonly known as: COGENTIN Take 1 mg by mouth 3 (three) times daily.   budesonide 0.25 MG/2ML nebulizer solution Commonly known as: Pulmicort Take 2 mLs (0.25 mg total) by nebulization 2 (two) times daily.   cetirizine 10 MG tablet Commonly known as: ZYRTEC Take 1 tablet (10 mg total) by mouth daily.   chlorproMAZINE 50 MG tablet Commonly known as: THORAZINE Take 1 tablet (50 mg total) by mouth daily.   FLUoxetine 20 MG capsule Commonly known as: PROZAC Take 1 capsule (20 mg total) by mouth daily.   gabapentin 100 MG capsule Commonly known as: NEURONTIN Take 100 mg by mouth 3 (three) times daily.  lovastatin 20 MG tablet Commonly known as: MEVACOR Take 1 tablet (20 mg total) by mouth daily. What changed: when to take this   montelukast 10 MG tablet Commonly known as: SINGULAIR Take 1 tablet (10 mg total) by mouth at bedtime. What changed: when to take this   OLANZapine 20 MG tablet Commonly known as: ZYPREXA Take 1 tablet  (20 mg total) by mouth at bedtime.   pantoprazole 40 MG tablet Commonly known as: PROTONIX Take 1 tablet (40 mg total) by mouth daily.   traZODone 50 MG tablet Commonly known as: DESYREL Take 1 tablet (50 mg total) by mouth at bedtime.   Trelegy Ellipta 100-62.5-25 MCG/ACT Aepb Generic drug: Fluticasone-Umeclidin-Vilant Inhale 1 puff into the lungs daily.   valbenazine 40 MG capsule Commonly known as: INGREZZA Take 40 mg by mouth daily.        Contact information for after-discharge care     [facility] .   Service: Skilled Nursing Contact information: [address] [place] [phone]                    Allergies  Allergen Reactions   Propoxyphene Rash    Consultations: None   Procedures/Studies: MR BRAIN WO CONTRAST  Result Date: 09/26/2022 CLINICAL DATA:  Hypotension, syncope. History of coil embolization of aneurysm EXAM: MRI HEAD WITHOUT CONTRAST TECHNIQUE: Multiplanar, multiecho pulse sequences of the brain and surrounding structures were obtained without intravenous contrast. COMPARISON:  MRI 06/03/2014 FINDINGS: Brain: There is no acute intracranial hemorrhage, extra-axial fluid collection, or acute infarct. There is mild parenchymal volume loss with prominence of the ventricular system and extra-axial CSF spaces. There is encephalomalacia in the left anterior temporal with an area of susceptibility artifact likely reflecting postprocedural change related to prior aneurysm coiling. Scattered foci of FLAIR signal abnormality in the remainder of the supratentorial white matter are nonspecific but likely reflects sequela of mild chronic small-vessel ischemic change. Small foci of susceptibility artifact in the right frontal lobe are noted, possibly reflecting prior microhemorrhage or calcification, nonspecific. There is no mass lesion.  There is no mass effect or midline  shift. Vascular: There is curvilinear DWI signal abnormality overlying the right parietal lobe with associated susceptibility artifact (4-37, 9-21). The flow voids of the major venous sinuses are normal. The major arterial flow voids are normal. Skull and upper cervical spine: Normal marrow signal. Sinuses/Orbits: There is moderate mucosal thickening in the right sphenoid sinus. The globes and orbits are unremarkable. Other: None. IMPRESSION: 1. Curvilinear diffusion restriction and susceptibility artifact overlying the right parietal lobe is indeterminate but could potentially reflect a small thrombosed cortical vein. 2. Otherwise, no evidence of acute intracranial pathology. 3. 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 Electronically Signed   By: Valetta Mole M.D.   On: 09/26/2022 14:59   DG Abd 1 View  Result Date: 09/25/2022 CLINICAL DATA:  MRI clearance. EXAM: PELVIS - 1-2 VIEW; ABDOMEN - 1 VIEW COMPARISON:  None Available. FINDINGS: Pelvis: There is no radiopaque foreign body identified. Bowel-gas pattern is nonobstructive. There are vascular calcifications in the pelvis. No acute fractures are seen. Hyperdense material seen throughout small bowel and colon. Abdomen: There is no radiopaque foreign body identified. There is hyperdense material throughout small bowel and colon. Bowel-gas pattern is nonobstructive. There are vascular calcifications in the pelvis. No suspicious calcifications overlie the kidneys. Lung bases are clear. Wires  overlie the lower chest. No acute fractures. IMPRESSION: 1. No radiopaque foreign body identified in the pelvis or abdomen. 2. Hyperdense material throughout small bowel and colon. No bowel obstruction. 3. Vascular calcifications. Electronically Signed   By: Darliss Cheney M.D.   On: 09/25/2022 23:40   DG Pelvis 1-2 Views  Result Date: 09/25/2022 CLINICAL DATA:  MRI clearance. EXAM: PELVIS - 1-2  VIEW; ABDOMEN - 1 VIEW COMPARISON:  None Available. FINDINGS: Pelvis: There is no radiopaque foreign body identified. Bowel-gas pattern is nonobstructive. There are vascular calcifications in the pelvis. No acute fractures are seen. Hyperdense material seen throughout small bowel and colon. Abdomen: There is no radiopaque foreign body identified. There is hyperdense material throughout small bowel and colon. Bowel-gas pattern is nonobstructive. There are vascular calcifications in the pelvis. No suspicious calcifications overlie the kidneys. Lung bases are clear. Wires overlie the lower chest. No acute fractures. IMPRESSION: 1. No radiopaque foreign body identified in the pelvis or abdomen. 2. Hyperdense material throughout small bowel and colon. No bowel obstruction. 3. Vascular calcifications. Electronically Signed   By: Darliss Cheney M.D.   On: 09/25/2022 23:40   DG Skull 1-3 Views  Result Date: 09/25/2022 CLINICAL DATA:  Initial evaluation for soft tissue foreign body. EXAM: SKULL - 1-3 VIEW COMPARISON:  Prior MRI from 06/03/2014. FINDINGS: Sequelae of prior coil embolization of presumed left MCA aneurysm noted. No other radiopaque foreign body seen about the scalp or orbital soft tissues. Visualized osseous structures within normal limits with no discrete or worrisome osseous lesions. Visualized paranasal sinuses are well pneumatized and clear. IMPRESSION: 1. No radiopaque foreign body identified about the scalp or orbital soft tissues. 2. 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 Electronically Signed   By: Rise Mu M.D.   On: 09/25/2022 19:54   CT CHEST WO CONTRAST  Result Date: 09/24/2022 CLINICAL DATA:  66 year old male with history of abnormal chest x-ray concerning for pneumonia. EXAM: CT CHEST WITHOUT CONTRAST TECHNIQUE: Multidetector CT imaging of the chest was performed following the standard protocol without IV contrast. RADIATION DOSE REDUCTION: This  exam was performed according to the departmental dose-optimization program which includes automated exposure control, adjustment of the mA and/or kV according to patient size and/or use of iterative reconstruction technique. COMPARISON:  Chest CT 06/17/2010.  Chest x-ray 09/24/2022. FINDINGS: Cardiovascular: Heart size is normal. There is no significant pericardial fluid, thickening or pericardial calcification. There is aortic atherosclerosis, as well as atherosclerosis of the great vessels of the mediastinum and the coronary arteries, including calcified atherosclerotic plaque in the left circumflex and right coronary arteries. Mediastinum/Nodes: No pathologically enlarged mediastinal or hilar lymph nodes. Please note that accurate exclusion of hilar adenopathy is limited on noncontrast CT scans. Esophagus is unremarkable in appearance. No axillary lymphadenopathy. Lungs/Pleura: Diffuse bronchial wall thickening with moderate centrilobular and paraseptal emphysema. Widespread areas of thickening of the peribronchovascular interstitium with some patchy regions of cylindrical and mild varicose bronchiectasis scattered throughout the lungs bilaterally with extensive peribronchovascular nodular and mass-like areas of apparent airspace consolidation, most evident throughout the right lung, but also prevalent in the inferior segment of the lingula. No pleural effusions. No other definite suspicious appearing pulmonary nodules or masses are noted. Upper Abdomen: Aortic atherosclerosis. Tiny calcified gallstones lying dependently in the gallbladder. Scattered small low-attenuation lesions throughout the visualized hepatic parenchyma, incompletely characterized on today's noncontrast CT examination, but similar to prior studies and statistically likely to represent small cysts and/or biliary hamartomas (no imaging follow-up recommended).  Musculoskeletal: Multiple healed and healing bilateral rib fractures are noted. In  addition, there are acute mildly displaced fractures of the posterolateral aspect of the left ninth and tenth ribs. There are no aggressive appearing lytic or blastic lesions noted in the visualized portions of the skeleton. IMPRESSION: 1. The appearance of the chest suggests probable multilobar bilateral bronchopneumonia, likely from atypical infection. This is apparently acute given the change between the September 24, 2022 chest x-ray and the prior chest x-ray from 02/26/2022. 2. In addition to multiple old healed and healing bilateral rib fractures there are acute mildly displaced fractures of the posterolateral left ninth and tenth ribs. No pneumothorax at this time. 3. Diffuse bronchial wall thickening with moderate centrilobular and paraseptal emphysema; imaging findings suggestive of underlying COPD. 4. Aortic atherosclerosis, in addition to 2 vessel coronary artery disease. Please note that although the presence of coronary artery calcium documents the presence of coronary artery disease, the severity of this disease and any potential stenosis cannot be assessed on this non-gated CT examination. Assessment for potential risk factor modification, dietary therapy or pharmacologic therapy may be warranted, if clinically indicated. 5. Cholelithiasis. Aortic Atherosclerosis (ICD10-I70.0) and Emphysema (ICD10-J43.9). Electronically Signed   By: Trudie Reedaniel  Entrikin M.D.   On: 09/24/2022 13:35   DG Chest Port 1 View  Result Date: 09/24/2022 CLINICAL DATA:  66 year old male with possible sepsis. Altered mental status, decreased p.o. EXAM: PORTABLE CHEST 1 VIEW COMPARISON:  Chest radiographs 02/26/2022 and earlier. FINDINGS: Portable AP upright view at 1129 hours. Stable to larger lung volumes but acute patchy bilateral pulmonary reticulonodular opacity, confluent in the lateral right upper lobe. Right greater than left lung base involvement. Mediastinal contours remain normal. Visualized tracheal air column is  within normal limits. No pneumothorax or pleural effusion. Chronic right 4th and 5th rib fractures. Stable visualized osseous structures. Negative visible bowel gas. IMPRESSION: Scattered acute bilateral patchy and interstitial pulmonary opacity suspicious for bilateral Pneumonia. No pleural effusion. Electronically Signed   By: Odessa FlemingH  Hall M.D.   On: 09/24/2022 11:49      Subjective: Seen and examined on day of dc.  Stable, no distress.  Appropriate for dc to SNF  Discharge Exam: Vitals:   09/28/22 0530 09/28/22 0743  BP: 106/76 99/69  Pulse: 86 85  Resp: 17 16  Temp: 98.6 F (37 C)   SpO2: 98% 100%   Vitals:   09/27/22 1657 09/27/22 1916 09/28/22 0530 09/28/22 0743  BP: 94/61 109/73 106/76 99/69  Pulse: 89 87 86 85  Resp: 18 17 17 16   Temp: 98.9 F (37.2 C) 98.6 F (37 C) 98.6 F (37 C)   TempSrc: Oral Oral Oral   SpO2: 100% 97% 98% 100%  Weight:      Height:        General: Pt is alert, awake, not in acute distress Cardiovascular: RRR, S1/S2 +, no rubs, no gallops Respiratory: CTA bilaterally, no wheezing, no rhonchi Abdominal: Soft, NT, ND, bowel sounds + Extremities: no edema, no cyanosis    The results of significant diagnostics from this hospitalization (including imaging, microbiology, ancillary and laboratory) are listed below for reference.     Microbiology: Recent Results (from the past 240 hour(s))  Resp Panel by RT-PCR (Flu A&B, Covid) Anterior Nasal Swab     Status: None   Collection Time: 09/24/22 11:26 AM   Specimen: Anterior Nasal Swab  Result Value Ref Range Status   SARS Coronavirus 2 by RT PCR NEGATIVE NEGATIVE Final    Comment: (NOTE)  SARS-CoV-2 target nucleic acids are NOT DETECTED.  The SARS-CoV-2 RNA is generally detectable in upper respiratory specimens during the acute phase of infection. The lowest concentration of SARS-CoV-2 viral copies this assay can detect is 138 copies/mL. A negative result does not preclude SARS-Cov-2 infection and  should not be used as the sole basis for treatment or other patient management decisions. A negative result may occur with  improper specimen collection/handling, submission of specimen other than nasopharyngeal swab, presence of viral mutation(s) within the areas targeted by this assay, and inadequate number of viral copies(<138 copies/mL). A negative result must be combined with clinical observations, patient history, and epidemiological information. The expected result is Negative.  Fact Sheet for Patients:  BloggerCourse.com  Fact Sheet for Healthcare Providers:  SeriousBroker.it  This test is no t yet approved or cleared by the [place] FDA and  has been authorized for detection and/or diagnosis of SARS-CoV-2 by FDA under an Emergency Use Authorization (EUA). This EUA will remain  in effect (meaning this test can be used) for the duration of the COVID-19 declaration under Section 564(b)(1) of the Act, 21 U.S.C.section 360bbb-3(b)(1), unless the authorization is terminated  or revoked sooner.       Influenza A by PCR NEGATIVE NEGATIVE Final   Influenza B by PCR NEGATIVE NEGATIVE Final    Comment: (NOTE) The Xpert Xpress SARS-CoV-2/FLU/RSV plus assay is intended as an aid in the diagnosis of influenza from Nasopharyngeal swab specimens and should not be used as a sole basis for treatment. Nasal washings and aspirates are unacceptable for Xpert Xpress SARS-CoV-2/FLU/RSV testing.  Fact Sheet for Patients: BloggerCourse.com  Fact Sheet for Healthcare Providers: SeriousBroker.it  This test is not yet approved or cleared by the [place] FDA and has been authorized for detection and/or diagnosis of SARS-CoV-2 by FDA under an Emergency Use Authorization (EUA). This EUA will remain in effect (meaning this test can be used) for the duration of the COVID-19 declaration  under Section 564(b)(1) of the Act, 21 U.S.C. section 360bbb-3(b)(1), unless the authorization is terminated or revoked.  Performed at [facility], [address] [place], [place] [address]   Blood Culture (routine x 2)     Status: None (Preliminary result)   Collection Time: 09/24/22 11:26 AM   Specimen: BLOOD  Result Value Ref Range Status   Specimen Description BLOOD BLOOD RIGHT WRIST  Final   Special Requests   Final    BOTTLES DRAWN AEROBIC AND ANAEROBIC Blood Culture results may not be optimal due to an inadequate volume of blood received in culture bottles   Culture   Final    NO GROWTH 3 DAYS Performed at [facility], [address], [place], [place] [address]    Report Status PENDING  Incomplete  Blood Culture (routine x 2)     Status: None (Preliminary result)   Collection Time: 09/24/22 11:26 AM   Specimen: BLOOD  Result Value Ref Range Status   Specimen Description BLOOD RIGHT ANTECUBITAL  Final   Special Requests   Final    BOTTLES DRAWN AEROBIC AND ANAEROBIC Blood Culture adequate volume   Culture   Final    NO GROWTH 3 DAYS Performed at [facility], [address] [place], [place] [address]    Report Status PENDING  Incomplete  Urine Culture     Status: Abnormal   Collection Time: 09/24/22 11:26 AM   Specimen: In/Out Cath Urine  Result Value Ref Range Status   Specimen Description  Final    IN/OUT CATH URINE Performed at [facility], [address] [place], [place] [address]    Special Requests   Final    NONE Performed at [facility], [address] [place], [place] [address]    Culture MULTIPLE SPECIES PRESENT, SUGGEST RECOLLECTION (A)  Final   Report Status 09/26/2022 FINAL  Final  Respiratory (~20 pathogens) panel by PCR     Status: None   Collection Time: 09/24/22  1:29 PM   Specimen: Nasopharyngeal Swab; Respiratory  Result Value Ref Range Status   Adenovirus NOT DETECTED NOT DETECTED  Final   Coronavirus 229E NOT DETECTED NOT DETECTED Final    Comment: (NOTE) The Coronavirus on the Respiratory Panel, DOES NOT test for the novel  Coronavirus (2019 nCoV)    Coronavirus HKU1 NOT DETECTED NOT DETECTED Final   Coronavirus NL63 NOT DETECTED NOT DETECTED Final   Coronavirus OC43 NOT DETECTED NOT DETECTED Final   Metapneumovirus NOT DETECTED NOT DETECTED Final   Rhinovirus / Enterovirus NOT DETECTED NOT DETECTED Final   Influenza A NOT DETECTED NOT DETECTED Final   Influenza B NOT DETECTED NOT DETECTED Final   Parainfluenza Virus 1 NOT DETECTED NOT DETECTED Final   Parainfluenza Virus 2 NOT DETECTED NOT DETECTED Final   Parainfluenza Virus 3 NOT DETECTED NOT DETECTED Final   Parainfluenza Virus 4 NOT DETECTED NOT DETECTED Final   Respiratory Syncytial Virus NOT DETECTED NOT DETECTED Final   Bordetella pertussis NOT DETECTED NOT DETECTED Final   Bordetella Parapertussis NOT DETECTED NOT DETECTED Final   Chlamydophila pneumoniae NOT DETECTED NOT DETECTED Final   Mycoplasma pneumoniae NOT DETECTED NOT DETECTED Final    Comment: Performed at [facility], [address] [place], [place] [address]     Labs: BNP (last 3 results) No results for input(s): "BNP" in the last 8760 hours. Basic Metabolic Panel: Recent Labs  Lab 09/24/22 1126 09/25/22 0615 09/26/22 0516 09/27/22 0653  NA 136 142 142 140  K 3.6 3.4* 3.6 4.3  CL 99 106 108 106  CO2 22 27 27 28   GLUCOSE 103* 97 96 91  BUN 11 6* <5* <5*  CREATININE 0.95 0.74 0.61 0.75  CALCIUM 8.9 8.4* 8.4* 8.7*  MG  --  1.7  --   --   PHOS  --  4.0  --   --    Liver Function Tests: Recent Labs  Lab 09/24/22 1126  AST 35  ALT 14  ALKPHOS 73  BILITOT 1.1  PROT 7.7  ALBUMIN 3.3*   No results for input(s): "LIPASE", "AMYLASE" in the last 168 hours. No results for input(s): "AMMONIA" in the last 168 hours. CBC: Recent Labs  Lab 09/24/22 1126 09/25/22 0615 09/26/22 0516 09/27/22 0653  WBC 10.0 5.5 6.0  7.9  NEUTROABS 6.2  --   --   --   HGB 13.7 12.1* 12.3* 12.6*  HCT 42.9 36.8* 38.5* 39.5  MCV 94.3 92.9 95.8 94.7  PLT 279 220 198 223   Cardiac Enzymes: No results for input(s): "CKTOTAL", "CKMB", "CKMBINDEX", "TROPONINI" in the last 168 hours. BNP: Invalid input(s): "POCBNP" CBG: No results for input(s): "GLUCAP" in the last 168 hours. D-Dimer No results for input(s): "DDIMER" in the last 72 hours. Hgb A1c No results for input(s): "HGBA1C" in the last 72 hours. Lipid Profile No results for input(s): "CHOL", "HDL", "LDLCALC", "TRIG", "CHOLHDL", "LDLDIRECT" in the last 72 hours. Thyroid function studies Recent Labs    09/25/22 1724  TSH  2.267   Anemia work up Recent Labs    09/25/22 1724  VITAMINB12 619  FOLATE 8.0   Urinalysis    Component Value Date/Time   COLORURINE AMBER (A) 09/24/2022 1126   APPEARANCEUR HAZY (A) 09/24/2022 1126   LABSPEC 1.017 09/24/2022 1126   PHURINE 5.0 09/24/2022 1126   GLUCOSEU NEGATIVE 09/24/2022 1126   HGBUR NEGATIVE 09/24/2022 1126   BILIRUBINUR NEGATIVE 09/24/2022 1126   KETONESUR NEGATIVE 09/24/2022 1126   PROTEINUR 30 (A) 09/24/2022 1126   UROBILINOGEN 0.2 10/24/2010 1942   NITRITE NEGATIVE 09/24/2022 1126   LEUKOCYTESUR NEGATIVE 09/24/2022 1126   Sepsis Labs Recent Labs  Lab 09/24/22 1126 09/25/22 0615 09/26/22 0516 09/27/22 0653  WBC 10.0 5.5 6.0 7.9   Microbiology Recent Results (from the past 240 hour(s))  Resp Panel by RT-PCR (Flu A&B, Covid) Anterior Nasal Swab     Status: None   Collection Time: 09/24/22 11:26 AM   Specimen: Anterior Nasal Swab  Result Value Ref Range Status   SARS Coronavirus 2 by RT PCR NEGATIVE NEGATIVE Final    Comment: (NOTE) SARS-CoV-2 target nucleic acids are NOT DETECTED.  The SARS-CoV-2 RNA is generally detectable in upper respiratory specimens during the acute phase of infection. The lowest concentration of SARS-CoV-2 viral copies this assay can detect is 138 copies/mL. A negative  result does not preclude SARS-Cov-2 infection and should not be used as the sole basis for treatment or other patient management decisions. A negative result may occur with  improper specimen collection/handling, submission of specimen other than nasopharyngeal swab, presence of viral mutation(s) within the areas targeted by this assay, and inadequate number of viral copies(<138 copies/mL). A negative result must be combined with clinical observations, patient history, and epidemiological information. The expected result is Negative.  Fact Sheet for Patients:  BloggerCourse.com  Fact Sheet for Healthcare Providers:  SeriousBroker.it  This test is no t yet approved or cleared by the [place] FDA and  has been authorized for detection and/or diagnosis of SARS-CoV-2 by FDA under an Emergency Use Authorization (EUA). This EUA will remain  in effect (meaning this test can be used) for the duration of the COVID-19 declaration under Section 564(b)(1) of the Act, 21 U.S.C.section 360bbb-3(b)(1), unless the authorization is terminated  or revoked sooner.       Influenza A by PCR NEGATIVE NEGATIVE Final   Influenza B by PCR NEGATIVE NEGATIVE Final    Comment: (NOTE) The Xpert Xpress SARS-CoV-2/FLU/RSV plus assay is intended as an aid in the diagnosis of influenza from Nasopharyngeal swab specimens and should not be used as a sole basis for treatment. Nasal washings and aspirates are unacceptable for Xpert Xpress SARS-CoV-2/FLU/RSV testing.  Fact Sheet for Patients: BloggerCourse.com  Fact Sheet for Healthcare Providers: SeriousBroker.it  This test is not yet approved or cleared by the [place] FDA and has been authorized for detection and/or diagnosis of SARS-CoV-2 by FDA under an Emergency Use Authorization (EUA). This EUA will remain in effect (meaning this test can be used)  for the duration of the COVID-19 declaration under Section 564(b)(1) of the Act, 21 U.S.C. section 360bbb-3(b)(1), unless the authorization is terminated or revoked.  Performed at [facility], [address] [place], [place] [address]   Blood Culture (routine x 2)     Status: None (Preliminary result)   Collection Time: 09/24/22 11:26 AM   Specimen: BLOOD  Result Value Ref Range Status   Specimen Description BLOOD BLOOD RIGHT WRIST  Final   Special  Requests   Final    BOTTLES DRAWN AEROBIC AND ANAEROBIC Blood Culture results may not be optimal due to an inadequate volume of blood received in culture bottles   Culture   Final    NO GROWTH 3 DAYS Performed at [facility], [address] [place], [place] [address]    Report Status PENDING  Incomplete  Blood Culture (routine x 2)     Status: None (Preliminary result)   Collection Time: 09/24/22 11:26 AM   Specimen: BLOOD  Result Value Ref Range Status   Specimen Description BLOOD RIGHT ANTECUBITAL  Final   Special Requests   Final    BOTTLES DRAWN AEROBIC AND ANAEROBIC Blood Culture adequate volume   Culture   Final    NO GROWTH 3 DAYS Performed at [facility], [address] [place], [place] [address]    Report Status PENDING  Incomplete  Urine Culture     Status: Abnormal   Collection Time: 09/24/22 11:26 AM   Specimen: In/Out Cath Urine  Result Value Ref Range Status   Specimen Description   Final    IN/OUT CATH URINE Performed at [facility], [address] [place], [place] [address]    Special Requests   Final    NONE Performed at [facility], [address] [place], [place] [address]    Culture MULTIPLE SPECIES PRESENT, SUGGEST RECOLLECTION (A)  Final   Report Status 09/26/2022 FINAL  Final  Respiratory (~20 pathogens) panel by PCR     Status: None   Collection Time: 09/24/22  1:29 PM   Specimen: Nasopharyngeal Swab; Respiratory  Result Value Ref Range  Status   Adenovirus NOT DETECTED NOT DETECTED Final   Coronavirus 229E NOT DETECTED NOT DETECTED Final    Comment: (NOTE) The Coronavirus on the Respiratory Panel, DOES NOT test for the novel  Coronavirus (2019 nCoV)    Coronavirus HKU1 NOT DETECTED NOT DETECTED Final   Coronavirus NL63 NOT DETECTED NOT DETECTED Final   Coronavirus OC43 NOT DETECTED NOT DETECTED Final   Metapneumovirus NOT DETECTED NOT DETECTED Final   Rhinovirus / Enterovirus NOT DETECTED NOT DETECTED Final   Influenza A NOT DETECTED NOT DETECTED Final   Influenza B NOT DETECTED NOT DETECTED Final   Parainfluenza Virus 1 NOT DETECTED NOT DETECTED Final   Parainfluenza Virus 2 NOT DETECTED NOT DETECTED Final   Parainfluenza Virus 3 NOT DETECTED NOT DETECTED Final   Parainfluenza Virus 4 NOT DETECTED NOT DETECTED Final   Respiratory Syncytial Virus NOT DETECTED NOT DETECTED Final   Bordetella pertussis NOT DETECTED NOT DETECTED Final   Bordetella Parapertussis NOT DETECTED NOT DETECTED Final   Chlamydophila pneumoniae NOT DETECTED NOT DETECTED Final   Mycoplasma pneumoniae NOT DETECTED NOT DETECTED Final    Comment: Performed at [facility], [address], [place], [place] [address]     Time coordinating discharge: Over 30 minutes  SIGNED:   Tresa Moore, MD  [facility] 09/28/2022, 8:53 AM Pager   If 7PM-7AM, please contact night-coverage

## 2022-09-28 NOTE — Plan of Care (Signed)

## 2022-09-29 LAB — CULTURE, BLOOD (ROUTINE X 2)
Culture: NO GROWTH
Culture: NO GROWTH
Special Requests: ADEQUATE

## 2022-10-01 ENCOUNTER — Emergency Department (HOSPITAL_COMMUNITY): Payer: 59

## 2022-10-01 ENCOUNTER — Emergency Department (HOSPITAL_COMMUNITY)
Admission: EM | Admit: 2022-10-01 | Discharge: 2022-10-02 | Disposition: A | Discharge status: Home / Self Care | Payer: 59 | Attending: Emergency Medicine | Admitting: Emergency Medicine

## 2022-10-01 ENCOUNTER — Other Ambulatory Visit: Payer: Self-pay

## 2022-10-01 ENCOUNTER — Encounter (HOSPITAL_COMMUNITY): Payer: Self-pay | Admitting: *Deleted

## 2022-10-01 DIAGNOSIS — Z7982 Long term (current) use of aspirin: Secondary | ICD-10-CM | POA: Insufficient documentation

## 2022-10-01 DIAGNOSIS — R55 Syncope and collapse: Secondary | ICD-10-CM | POA: Diagnosis present

## 2022-10-01 DIAGNOSIS — J449 Chronic obstructive pulmonary disease, unspecified: Secondary | ICD-10-CM | POA: Diagnosis not present

## 2022-10-01 DIAGNOSIS — Y92129 Unspecified place in nursing home as the place of occurrence of the external cause: Secondary | ICD-10-CM | POA: Insufficient documentation

## 2022-10-01 DIAGNOSIS — I10 Essential (primary) hypertension: Secondary | ICD-10-CM | POA: Diagnosis not present

## 2022-10-01 DIAGNOSIS — W19XXXA Unspecified fall, initial encounter: Secondary | ICD-10-CM | POA: Insufficient documentation

## 2022-10-01 DIAGNOSIS — Z7951 Long term (current) use of inhaled steroids: Secondary | ICD-10-CM | POA: Diagnosis not present

## 2022-10-01 DIAGNOSIS — E860 Dehydration: Secondary | ICD-10-CM | POA: Diagnosis not present

## 2022-10-01 HISTORY — DX: Atherosclerosis of aorta: I70.0

## 2022-10-01 HISTORY — DX: Anxiety disorder, unspecified: F41.9

## 2022-10-01 HISTORY — DX: Psoriasis vulgaris: L40.0

## 2022-10-01 HISTORY — DX: Dysphagia, oropharyngeal phase: R13.12

## 2022-10-01 HISTORY — DX: Cerebral aneurysm, nonruptured: I67.1

## 2022-10-01 HISTORY — DX: Repeated falls: R29.6

## 2022-10-01 LAB — COMPREHENSIVE METABOLIC PANEL
ALT: 14 U/L (ref 0–44)
AST: 25 U/L (ref 15–41)
Albumin: 3.6 g/dL (ref 3.5–5.0)
Alkaline Phosphatase: 65 U/L (ref 38–126)
Anion gap: 7 (ref 5–15)
BUN: 13 mg/dL (ref 8–23)
CO2: 28 mmol/L (ref 22–32)
Calcium: 8.9 mg/dL (ref 8.9–10.3)
Chloride: 101 mmol/L (ref 98–111)
Creatinine, Ser: 1.3 mg/dL — ABNORMAL HIGH (ref 0.61–1.24)
GFR, Estimated: >60 mL/min (ref >60)
Glucose, Bld: 60 mg/dL — ABNORMAL LOW (ref 70–99)
Potassium: 3.6 mmol/L (ref 3.5–5.1)
Sodium: 136 mmol/L (ref 135–145)
Total Bilirubin: 0.8 mg/dL (ref 0.3–1.2)
Total Protein: 7.2 g/dL (ref 6.5–8.1)

## 2022-10-01 LAB — CBC WITH DIFFERENTIAL/PLATELET
Abs Immature Granulocytes: 0.03 10*3/uL (ref 0.00–0.07)
Basophils Absolute: 0 10*3/uL (ref 0.0–0.1)
Basophils Relative: 0 %
Eosinophils Absolute: 0 10*3/uL (ref 0.0–0.5)
Eosinophils Relative: 0 %
HCT: 39.5 % (ref 39.0–52.0)
Hemoglobin: 13 g/dL (ref 13.0–17.0)
Immature Granulocytes: 0 %
Lymphocytes Relative: 32 %
Lymphs Abs: 2.8 10*3/uL (ref 0.7–4.0)
MCH: 31.6 pg (ref 26.0–34.0)
MCHC: 32.9 g/dL (ref 30.0–36.0)
MCV: 95.9 fL (ref 80.0–100.0)
Monocytes Absolute: 0.7 10*3/uL (ref 0.1–1.0)
Monocytes Relative: 8 %
Neutro Abs: 5.3 10*3/uL (ref 1.7–7.7)
Neutrophils Relative %: 60 %
Platelets: 181 10*3/uL (ref 150–400)
RBC: 4.12 MIL/uL — ABNORMAL LOW (ref 4.22–5.81)
RDW: 16.1 % — ABNORMAL HIGH (ref 11.5–15.5)
WBC: 8.9 10*3/uL (ref 4.0–10.5)
nRBC: 0 % (ref 0.0–0.2)

## 2022-10-01 LAB — CBG MONITORING, ED: Glucose-Capillary: 98 mg/dL (ref 70–99)

## 2022-10-01 LAB — TROPONIN I (HIGH SENSITIVITY)
Troponin I (High Sensitivity): 3 ng/L (ref <18)
Troponin I (High Sensitivity): 3 ng/L (ref <18)

## 2022-10-01 MED ORDER — SODIUM CHLORIDE 0.9 % IV BOLUS
1000.0000 mL | Freq: Once | INTRAVENOUS | Status: AC
  Administered 2022-10-01: 1000 mL via INTRAVENOUS

## 2022-10-01 MED ORDER — DEXTROSE 50 % IV SOLN: 25.0000 mL | Freq: Once | INTRAVENOUS | Status: DC

## 2022-10-01 MED ORDER — LEVETIRACETAM IN NACL 1000 MG/100ML IV SOLN
1000.0000 mg | INTRAVENOUS | Status: AC
  Administered 2022-10-01 (×2): 1000 mg via INTRAVENOUS
  Filled 2022-10-01 (×2): qty 100

## 2022-10-01 MED ORDER — LEVETIRACETAM 500 MG PO TABS: 500.0000 mg | ORAL_TABLET | Freq: Two times a day (BID) | ORAL | 0 refills | Status: AC

## 2022-10-01 NOTE — Discharge Instructions (Signed)
Follow-up with Dr. Merlene Laughter for questionable seizures.  Drink plenty of fluids.

## 2022-10-01 NOTE — ED Notes (Signed)
Pt pulled his IV out while moving around in the bed.

## 2022-10-01 NOTE — ED Notes (Signed)
Patient transported to CT 

## 2022-10-01 NOTE — ED Notes (Signed)
Ian Medina with [place] [facility] informed of pts disposition

## 2022-10-01 NOTE — ED Notes (Addendum)
[name] with [facility] [phone]

## 2022-10-01 NOTE — ED Notes (Signed)
[facility] notified of patient needing transportation back to [facility].

## 2022-10-01 NOTE — ED Provider Notes (Signed)
[facility] EMERGENCY DEPARTMENT Provider Note   CSN: [id number] Arrival date & time: 10/01/22  1519     History {Add pertinent medical, surgical, social history, OB history to HPI:1} No chief complaint on file.   Ian Medina is a 66 y.o. male.  Patient was at the nursing home.  And he fell out on a table.  Questionable seizure.  Patient has a history of schizophrenia hypertension and COPD   Loss of Consciousness      Home Medications Prior to Admission medications   Medication Sig Start Date End Date Taking? Authorizing Provider  levETIRAcetam (KEPPRA) 500 MG tablet Take 1 tablet (500 mg total) by mouth 2 (two) times daily. 10/01/22  Yes Bethann Berkshire, MD  albuterol (VENTOLIN HFA) 108 (90 Base) MCG/ACT inhaler Inhale 2 puffs into the lungs every 6 (six) hours as needed for wheezing or shortness of breath. 06/07/22   Gilles Chiquito, MD  aspirin 81 MG chewable tablet Chew 81 mg by mouth daily.    [provider]  benztropine (COGENTIN) 1 MG tablet Take 1 mg by mouth 3 (three) times daily.    [provider]  budesonide (PULMICORT) 0.25 MG/2ML nebulizer solution Take 2 mLs (0.25 mg total) by nebulization 2 (two) times daily. 06/07/22   Gilles Chiquito, MD  cetirizine (ZYRTEC) 10 MG tablet Take 1 tablet (10 mg total) by mouth daily. 06/07/22   Gilles Chiquito, MD  chlorproMAZINE (THORAZINE) 50 MG tablet Take 1 tablet (50 mg total) by mouth daily. 06/07/22   Gilles Chiquito, MD  FLUoxetine (PROZAC) 20 MG capsule Take 1 capsule (20 mg total) by mouth daily. 06/07/22   Gilles Chiquito, MD  Fluticasone-Umeclidin-Vilant (TRELEGY ELLIPTA) 100-62.5-25 MCG/ACT AEPB Inhale 1 puff into the lungs daily.    [provider]  gabapentin (NEURONTIN) 100 MG capsule Take 100 mg by mouth 3 (three) times daily. 01/25/22   [provider]  lovastatin (MEVACOR) 20 MG tablet Take 1 tablet (20 mg total) by mouth daily. Patient taking differently: Take 20 mg by mouth  every evening. 06/07/22   Gilles Chiquito, MD  montelukast (SINGULAIR) 10 MG tablet Take 1 tablet (10 mg total) by mouth at bedtime. Patient taking differently: Take 10 mg by mouth every evening. 06/07/22   Gilles Chiquito, MD  OLANZapine (ZYPREXA) 20 MG tablet Take 1 tablet (20 mg total) by mouth at bedtime. 06/07/22   Gilles Chiquito, MD  pantoprazole (PROTONIX) 40 MG tablet Take 1 tablet (40 mg total) by mouth daily. 06/07/22   Gilles Chiquito, MD  traZODone (DESYREL) 50 MG tablet Take 1 tablet (50 mg total) by mouth at bedtime. 06/07/22   Gilles Chiquito, MD  valbenazine [facility]) 40 MG capsule Take 40 mg by mouth daily.    [provider]      Allergies    Propoxyphene    Review of Systems   Review of Systems  Cardiovascular:  Positive for syncope.    Physical Exam Updated Vital Signs BP 90/62   Pulse 72   Temp 98 F (36.7 C) (Oral)   Resp 11   Ht 5\' 7"  (1.702 m)   Wt 68 kg   SpO2 95%   BMI 23.49 kg/m  Physical Exam  ED Results / Procedures / Treatments   Labs (all labs ordered are listed, but only abnormal results are displayed) Labs Reviewed  CBC WITH DIFFERENTIAL/PLATELET - Abnormal; Notable for the following components:      Result  Value   RBC 4.12 (*)    RDW 16.1 (*)    All other components within normal limits  COMPREHENSIVE METABOLIC PANEL - Abnormal; Notable for the following components:   Glucose, Bld 60 (*)    Creatinine, Ser 1.30 (*)    All other components within normal limits  TROPONIN I (HIGH SENSITIVITY)  TROPONIN I (HIGH SENSITIVITY)    EKG None  Radiology CT Head Wo Contrast  Result Date: 10/01/2022 CLINICAL DATA:  66 year old male with possible seizure, fall and altered mental status. History of LEFT cerebral aneurysm embolization EXAM: CT HEAD WITHOUT CONTRAST TECHNIQUE: Contiguous axial images were obtained from the base of the skull through the vertex without intravenous contrast. RADIATION DOSE REDUCTION: This exam was  performed according to the departmental dose-optimization program which includes automated exposure control, adjustment of the mA and/or kV according to patient size and/or use of iterative reconstruction technique. COMPARISON:  09/26/2022 MR FINDINGS: Brain: Embolization coils in the region of the LEFT MCA creates artifact limiting evaluation in this area. No evidence of acute infarction, hemorrhage, hydrocephalus, extra-axial collection or mass lesion/mass effect. Atrophy and chronic white matter hypodensities again noted. Vascular: No hyperdense vessel or unexpected calcification. Skull: Negative for fracture or focal lesion. RIGHT frontal burr hole noted. Sinuses/Orbits: No acute finding. Other: None. IMPRESSION: 1. No evidence of acute intracranial abnormality. 2. Atrophy and chronic white matter changes, likely small vessel ischemic changes. 3. Embolization coils in the region of the LEFT MCA. Electronically Signed   By: Harmon Pier M.D.   On: 10/01/2022 16:59   DG Chest Port 1 View  Result Date: 10/01/2022 CLINICAL DATA:  Weakness. EXAM: PORTABLE CHEST 1 VIEW COMPARISON:  Radiograph and CT 09/24/2022 FINDINGS: Lower lung volumes from prior exam. There is mild volume loss in the right hemithorax accentuated by rotation. Peripheral opacity in the right mid lung with additional right basilar opacities, similar. There is slight improvement in the left infrahilar airspace disease. No pneumothorax. No large pleural effusion. There are multiple remote right rib fractures. IMPRESSION: 1. Lower lung volumes from prior exam. 2. Slight improvement in left infrahilar airspace disease. 3. Peripheral opacity in the right mid lung with additional right basilar opacities, similar to prior, previously characterized as pneumonia. Electronically Signed   By: Narda Rutherford M.D.   On: 10/01/2022 16:38    Procedures Procedures  {Document cardiac monitor, telemetry assessment procedure when appropriate:1}  Medications  Ordered in ED Medications  sodium chloride 0.9 % bolus 1,000 mL (has no administration in time range)  dextrose 50 % solution 25 mL (has no administration in time range)  sodium chloride 0.9 % bolus 1,000 mL (0 mLs Intravenous Stopped 10/01/22 1750)  levETIRAcetam (KEPPRA) IVPB 1000 mg/100 mL premix (0 mg Intravenous Stopped 10/01/22 1653)    ED Course/ Medical Decision Making/ A&P                           Medical Decision Making Amount and/or Complexity of Data Reviewed Labs: ordered. Radiology: ordered. ECG/medicine tests: ordered.  Risk Prescription drug management.   Labs show some dehydration.  CT scan shows no acute disease.  Doubt patient had seizure but we will start him on Keppra and have him follow-up with Dr. Gerilyn Pilgrim  {Document critical care time when appropriate:1} {Document review of labs and clinical decision tools ie heart score, Chads2Vasc2 etc:1}  {Document your independent review of radiology images, and any outside records:1} {Document your discussion with family  members, caretakers, and with consultants:1} {Document social determinants of health affecting pt's care:1} {Document your decision making why or why not admission, treatments were needed:1} Final Clinical Impression(s) / ED Diagnoses Final diagnoses:  Syncope and collapse  Dehydration    Rx / DC Orders ED Discharge Orders          Ordered    levETIRAcetam (KEPPRA) 500 MG tablet  2 times daily        10/01/22 2003

## 2022-10-01 NOTE — ED Notes (Signed)
Pt given meal tray and a drink.  

## 2022-10-01 NOTE — ED Triage Notes (Addendum)
Pt brought in by [facility] from [place] with c/o possible seizure after falling face first onto the nurses's station. CBG 97, ST on monitor for EMS. Pt has dried blood to his nose and face. Small skin tears to nose. Pt alert but confused. Unknown baseline other than that he has mild intellectual disabilities.

## 2022-10-02 DIAGNOSIS — R55 Syncope and collapse: Secondary | ICD-10-CM | POA: Diagnosis not present

## 2022-10-02 LAB — CBG MONITORING, ED
Glucose-Capillary: 101 mg/dL — ABNORMAL HIGH (ref 70–99)
Glucose-Capillary: 93 mg/dL (ref 70–99)

## 2022-10-02 NOTE — ED Notes (Signed)
Pt continues to attempt to get out of bed. Pt redirected, given urinal and cup of water. Bed locked in lowest position with side rails up.

## 2022-10-02 NOTE — ED Notes (Signed)
Pt attempting to get out of bed, pt redirected. Bed locked in lowest position with side rails up.

## 2022-10-13 ENCOUNTER — Emergency Department
Admission: EM | Admit: 2022-10-13 | Discharge: 2022-10-13 | Disposition: A | Discharge status: Home / Self Care | Payer: 59 | Attending: Emergency Medicine | Admitting: Emergency Medicine

## 2022-10-13 ENCOUNTER — Other Ambulatory Visit: Payer: Self-pay

## 2022-10-13 ENCOUNTER — Emergency Department: Payer: 59

## 2022-10-13 DIAGNOSIS — R262 Difficulty in walking, not elsewhere classified: Secondary | ICD-10-CM

## 2022-10-13 DIAGNOSIS — J449 Chronic obstructive pulmonary disease, unspecified: Secondary | ICD-10-CM | POA: Diagnosis not present

## 2022-10-13 DIAGNOSIS — R296 Repeated falls: Secondary | ICD-10-CM | POA: Diagnosis not present

## 2022-10-13 DIAGNOSIS — I10 Essential (primary) hypertension: Secondary | ICD-10-CM | POA: Insufficient documentation

## 2022-10-13 DIAGNOSIS — Z20822 Contact with and (suspected) exposure to covid-19: Secondary | ICD-10-CM | POA: Diagnosis not present

## 2022-10-13 DIAGNOSIS — R2689 Other abnormalities of gait and mobility: Secondary | ICD-10-CM | POA: Diagnosis not present

## 2022-10-13 DIAGNOSIS — R531 Weakness: Secondary | ICD-10-CM | POA: Diagnosis present

## 2022-10-13 DIAGNOSIS — R4182 Altered mental status, unspecified: Secondary | ICD-10-CM | POA: Diagnosis not present

## 2022-10-13 LAB — CBC WITH DIFFERENTIAL/PLATELET
Abs Immature Granulocytes: 0.05 10*3/uL (ref 0.00–0.07)
Basophils Absolute: 0 10*3/uL (ref 0.0–0.1)
Basophils Relative: 0 %
Eosinophils Absolute: 0 10*3/uL (ref 0.0–0.5)
Eosinophils Relative: 0 %
HCT: 33.7 % — ABNORMAL LOW (ref 39.0–52.0)
Hemoglobin: 11.1 g/dL — ABNORMAL LOW (ref 13.0–17.0)
Immature Granulocytes: 1 %
Lymphocytes Relative: 35 %
Lymphs Abs: 3.6 10*3/uL (ref 0.7–4.0)
MCH: 30.9 pg (ref 26.0–34.0)
MCHC: 32.9 g/dL (ref 30.0–36.0)
MCV: 93.9 fL (ref 80.0–100.0)
Monocytes Absolute: 0.7 10*3/uL (ref 0.1–1.0)
Monocytes Relative: 7 %
Neutro Abs: 5.7 10*3/uL (ref 1.7–7.7)
Neutrophils Relative %: 57 %
Platelets: 210 10*3/uL (ref 150–400)
RBC: 3.59 MIL/uL — ABNORMAL LOW (ref 4.22–5.81)
RDW: 15.9 % — ABNORMAL HIGH (ref 11.5–15.5)
WBC: 10.1 10*3/uL (ref 4.0–10.5)
nRBC: 0 % (ref 0.0–0.2)

## 2022-10-13 LAB — COMPREHENSIVE METABOLIC PANEL
ALT: 8 U/L (ref 0–44)
AST: 17 U/L (ref 15–41)
Albumin: 2.9 g/dL — ABNORMAL LOW (ref 3.5–5.0)
Alkaline Phosphatase: 63 U/L (ref 38–126)
Anion gap: 7 (ref 5–15)
BUN: 12 mg/dL (ref 8–23)
CO2: 27 mmol/L (ref 22–32)
Calcium: 8.1 mg/dL — ABNORMAL LOW (ref 8.9–10.3)
Chloride: 105 mmol/L (ref 98–111)
Creatinine, Ser: 0.81 mg/dL (ref 0.61–1.24)
GFR, Estimated: >60 mL/min (ref >60)
Glucose, Bld: 102 mg/dL — ABNORMAL HIGH (ref 70–99)
Potassium: 3.9 mmol/L (ref 3.5–5.1)
Sodium: 139 mmol/L (ref 135–145)
Total Bilirubin: 1.1 mg/dL (ref 0.3–1.2)
Total Protein: 5.8 g/dL — ABNORMAL LOW (ref 6.5–8.1)

## 2022-10-13 LAB — CK: Total CK: 55 U/L (ref 49–397)

## 2022-10-13 LAB — RESP PANEL BY RT-PCR (FLU A&B, COVID) ARPGX2
Influenza A by PCR: NEGATIVE
Influenza B by PCR: NEGATIVE
SARS Coronavirus 2 by RT PCR: NEGATIVE

## 2022-10-13 LAB — TSH: TSH: 1.536 u[IU]/mL (ref 0.350–4.500)

## 2022-10-13 LAB — ETHANOL: Alcohol, Ethyl (B): <10 mg/dL (ref <10)

## 2022-10-13 MED ORDER — SODIUM CHLORIDE 0.9 % IV BOLUS
1000.0000 mL | Freq: Once | INTRAVENOUS | Status: AC
  Administered 2022-10-13: 1000 mL via INTRAVENOUS

## 2022-10-13 NOTE — ED Provider Notes (Signed)
[facility] Provider Note    Event Date/Time   First MD Initiated Contact with Patient 10/13/22 1603     (approximate)   History   Chief Complaint: Weakness   HPI  Ian Medina is a 66 y.o. male with a past history of schizophrenia COPD hypertension, intellectual disability who was sent to [place] from nursing home due to multiple falls.  EMS report when they arrived on scene, the patient had a blood pressure of 70/40, and then he stood up and had syncope.  Patient denies any symptoms currently.  He does agree that he gets lightheaded whenever he stands up and this has been going on for several weeks causing multiple falls.  He notes that he does not eat much at his facility because he does not like the food there.  He states "it is a family care home, the food is no good."      Physical Exam   Triage Vital Signs: ED Triage Vitals [10/13/22 1606]  Enc Vitals Group     BP 91/70     Pulse Rate 86     Resp 14     Temp 98.2 F (36.8 C)     Temp Source Oral     SpO2 100 %     Weight      Height      Head Circumference      Peak Flow      Pain Score      Pain Loc      Pain Edu?      Excl. in GC?     Most recent vital signs: Vitals:   10/13/22 1740 10/13/22 1930  BP: 106/70 92/62  Pulse: 69   Resp: 11 14  Temp:    SpO2: 100%     General: Awake, no distress.  Innumerable bruises on the head arms and legs. CV:  Good peripheral perfusion.  Regular rate and rhythm.  Normal distal pulses Resp:  Normal effort.  Clear to auscultation bilaterally Abd:  No distention.  Soft nontender Other:  Full range of motion all extremities, no deformities or bony tenderness.  Normal speech and language.  PERRL, EOMI.  No open wounds.   ED Results / Procedures / Treatments   Labs (all labs ordered are listed, but only abnormal results are displayed) Labs Reviewed  CBC WITH DIFFERENTIAL/PLATELET - Abnormal; Notable for the following components:      Result  Value   RBC 3.59 (*)    Hemoglobin 11.1 (*)    HCT 33.7 (*)    RDW 15.9 (*)    All other components within normal limits  COMPREHENSIVE METABOLIC PANEL - Abnormal; Notable for the following components:   Glucose, Bld 102 (*)    Calcium 8.1 (*)    Total Protein 5.8 (*)    Albumin 2.9 (*)    All other components within normal limits  RESP PANEL BY RT-PCR (FLU A&B, COVID) ARPGX2  ETHANOL  CK  TSH     EKG Interpreted by me Sinus rhythm rate of 86.  Normal axis and intervals.  Normal QRS ST segments and T waves.  No acute changes.   RADIOLOGY CT head interpreted by me, negative for intracranial hemorrhage or mass.  Radiology report reviewed.   PROCEDURES:  Procedures   MEDICATIONS ORDERED IN ED: Medications  sodium chloride 0.9 % bolus 1,000 mL (1,000 mLs Intravenous New Bag/Given 10/13/22 1630)     IMPRESSION / MDM / ASSESSMENT AND PLAN /  ED COURSE  I reviewed the triage vital signs and the nursing notes.                              Differential diagnosis includes, but is not limited to, intracranial hemorrhage, intracranial mass, electrolyte abnormality, dehydration, AKI, anemia, hypothyroidism, rhabdomyolysis  Patient's presentation is most consistent with acute presentation with potential threat to life or bodily function.  Patient presents with multiple falls, lightheadedness with standing and syncope today.  Found to be hypotensive initially with EMS.  Clinically seems to be dehydrated and orthostatic.  We will give IV fluids, continue the 500 mL that EMS has started and given additional 1 L bolus.  Will check labs and CT head.   ----------------------------------------- 8:14 PM on 10/13/2022 ----------------------------------------- CT and labs all unremarkable.  Vitals remain normal.  Patient denies any acute complaints and does seem to be at his baseline state of health.  Does not require admission, can be discharged back to his residential facility.       FINAL CLINICAL IMPRESSION(S) / ED DIAGNOSES   Final diagnoses:  Ambulatory dysfunction     Rx / DC Orders   ED Discharge Orders     None        Note:  This document was prepared using Dragon voice recognition software and may include unintentional dictation errors.   Sharman Cheek, MD 10/13/22 2015

## 2022-10-13 NOTE — Discharge Instructions (Signed)
Your lab tests and CT scan of the head were all okay today.  Please continue follow-up with your doctor and physical therapy.

## 2022-10-13 NOTE — ED Notes (Signed)
[name] from [place] [employer] to let this RN know she is the legal guardian of this pt at this time and she can be reached at [phone] and she can be reached at anytime for further questions.  Legal guardian is worried about continued weight loss.

## 2022-10-13 NOTE — ED Triage Notes (Signed)
Pt presents to ED with c/o of weakness for months. Pt has multiple falls. Pt has abrasions And multiple bruises in different stages of healing. Pt is A&Ox4. Pt at baseline per EMS.

## 2022-10-16 ENCOUNTER — Other Ambulatory Visit: Payer: Self-pay

## 2022-10-16 ENCOUNTER — Encounter: Payer: Self-pay | Admitting: Emergency Medicine

## 2022-10-16 ENCOUNTER — Emergency Department: Payer: 59

## 2022-10-16 ENCOUNTER — Emergency Department
Admission: EM | Admit: 2022-10-16 | Discharge: 2022-10-16 | Disposition: A | Discharge status: Home / Self Care | Payer: 59 | Attending: Emergency Medicine | Admitting: Emergency Medicine

## 2022-10-16 DIAGNOSIS — W19XXXA Unspecified fall, initial encounter: Secondary | ICD-10-CM

## 2022-10-16 DIAGNOSIS — W0110XA Fall on same level from slipping, tripping and stumbling with subsequent striking against unspecified object, initial encounter: Secondary | ICD-10-CM | POA: Diagnosis not present

## 2022-10-16 DIAGNOSIS — S0181XA Laceration without foreign body of other part of head, initial encounter: Secondary | ICD-10-CM | POA: Insufficient documentation

## 2022-10-16 DIAGNOSIS — Y92129 Unspecified place in nursing home as the place of occurrence of the external cause: Secondary | ICD-10-CM | POA: Insufficient documentation

## 2022-10-16 DIAGNOSIS — S01112A Laceration without foreign body of left eyelid and periocular area, initial encounter: Secondary | ICD-10-CM | POA: Diagnosis present

## 2022-10-16 DIAGNOSIS — S0990XA Unspecified injury of head, initial encounter: Secondary | ICD-10-CM

## 2022-10-16 LAB — COMPREHENSIVE METABOLIC PANEL
ALT: 9 U/L (ref 0–44)
AST: 25 U/L (ref 15–41)
Albumin: 3.5 g/dL (ref 3.5–5.0)
Alkaline Phosphatase: 74 U/L (ref 38–126)
Anion gap: 12 (ref 5–15)
BUN: 14 mg/dL (ref 8–23)
CO2: 26 mmol/L (ref 22–32)
Calcium: 8.9 mg/dL (ref 8.9–10.3)
Chloride: 103 mmol/L (ref 98–111)
Creatinine, Ser: 0.98 mg/dL (ref 0.61–1.24)
GFR, Estimated: >60 mL/min (ref >60)
Glucose, Bld: 124 mg/dL — ABNORMAL HIGH (ref 70–99)
Potassium: 3.3 mmol/L — ABNORMAL LOW (ref 3.5–5.1)
Sodium: 141 mmol/L (ref 135–145)
Total Bilirubin: 1.2 mg/dL (ref 0.3–1.2)
Total Protein: 7 g/dL (ref 6.5–8.1)

## 2022-10-16 LAB — CBC
HCT: 37.2 % — ABNORMAL LOW (ref 39.0–52.0)
Hemoglobin: 12 g/dL — ABNORMAL LOW (ref 13.0–17.0)
MCH: 30.9 pg (ref 26.0–34.0)
MCHC: 32.3 g/dL (ref 30.0–36.0)
MCV: 95.9 fL (ref 80.0–100.0)
Platelets: 267 10*3/uL (ref 150–400)
RBC: 3.88 MIL/uL — ABNORMAL LOW (ref 4.22–5.81)
RDW: 15.9 % — ABNORMAL HIGH (ref 11.5–15.5)
WBC: 9.2 10*3/uL (ref 4.0–10.5)
nRBC: 0 % (ref 0.0–0.2)

## 2022-10-16 LAB — TROPONIN I (HIGH SENSITIVITY): Troponin I (High Sensitivity): 6 ng/L (ref <18)

## 2022-10-16 NOTE — ED Provider Notes (Signed)
[facility] Provider Note    Event Date/Time   First MD Initiated Contact with Patient 10/16/22 1629     (approximate)   History   Fall   HPI  Ian Medina is a 66 y.o. male with extensive past medical history including schizoaffective disorder who presents after a fall.  It is not clear what caused this fall.  He does report hitting his head.  Suffered small laceration above his left eyebrow.  Denies LOC.  No chest pain no palpitations no abdominal pain no extremity or back pain.     Physical Exam   Triage Vital Signs: ED Triage Vitals  Enc Vitals Group     BP 10/16/22 1602 102/62     Pulse Rate 10/16/22 1602 (!) 109     Resp 10/16/22 1602 18     Temp 10/16/22 1602 98.1 F (36.7 C)     Temp Source 10/16/22 1602 Oral     SpO2 10/16/22 1602 97 %     Weight 10/16/22 1603 68 kg (150 lb)     Height 10/16/22 1603 1.702 m (5\' 7" )     Head Circumference --      Peak Flow --      Pain Score 10/16/22 1603 0     Pain Loc --      Pain Edu? --      Excl. in GC? --     Most recent vital signs: Vitals:   10/16/22 1602 10/16/22 1716  BP: 102/62 104/60  Pulse: (!) 109 90  Resp: 18 18  Temp: 98.1 F (36.7 C)   SpO2: 97% 98%     General: Awake, no distress.  CV:  Good peripheral perfusion.  No chest wall tenderness Resp:  Normal effort.  Clear to auscultation  Abd:  No distention.  Soft, nontender Other:  1 cm shallow laceration above the left eyebrow, No vertebral tenderness palpation, moves all extremities well and without difficulty   ED Results / Procedures / Treatments   Labs (all labs ordered are listed, but only abnormal results are displayed) Labs Reviewed  CBC - Abnormal; Notable for the following components:      Result Value   RBC 3.88 (*)    Hemoglobin 12.0 (*)    HCT 37.2 (*)    RDW 15.9 (*)    All other components within normal limits  COMPREHENSIVE METABOLIC PANEL - Abnormal; Notable for the following components:    Potassium 3.3 (*)    Glucose, Bld 124 (*)    All other components within normal limits  TROPONIN I (HIGH SENSITIVITY)  TROPONIN I (HIGH SENSITIVITY)     EKG  ED ECG REPORT I, 10/18/22, the attending physician, personally viewed and interpreted this ECG.  Date: 10/16/2022  Rhythm: sinus tachycardia QRS Axis: normal Intervals: normal ST/T Wave abnormalities: normal Narrative Interpretation: no evidence of acute ischemia    RADIOLOGY CT head cervical spine viewed interpreted by me, no acute abnormality    PROCEDURES:  Critical Care performed:   Procedures   MEDICATIONS ORDERED IN ED: Medications - No data to display   IMPRESSION / MDM / ASSESSMENT AND PLAN / ED COURSE  I reviewed the triage vital signs and the nursing notes. Patient's presentation is most consistent with acute presentation with potential threat to life or bodily function.  Patient presents after fall with head injury.  Unclear whether this was mechanical or syncopal episode  Lab work checked and is overall quite reassuring, no  change from prior, EKG and high sensitive troponin are reassuring.  No evidence of significant electrolyte abnormalities.  CT head and cervical spine without significant injury.  Small laceration not require repair, bandaged, patient is appropriate for discharge at this time, no indication for admission       FINAL CLINICAL IMPRESSION(S) / ED DIAGNOSES   Final diagnoses:  Fall, initial encounter  Injury of head, initial encounter  Laceration of forehead, initial encounter     Rx / DC Orders   ED Discharge Orders     None        Note:  This document was prepared using Dragon voice recognition software and may include unintentional dictation errors.   Jene Every, MD 10/16/22 [phone]

## 2022-10-16 NOTE — ED Triage Notes (Signed)
Pt brought from group home, pt unsure of group home for fall. Pt poor historian unsure of why he fell. Pt with lac to left eyebrow, denies any other injury,.

## 2022-10-16 NOTE — ED Notes (Signed)
Pt is wandering the ED even after multiple attempts of trying to redirect pt, pt here for syncope. This RN attempted to call [name] from DSS to see if someone could come sit with him or provide a number to the group home. [name] from DSS gave this RN Ian Medina Rays number who is the DON of group home, this RN attempted to call, no answer, voicemail left.    [name] [phone]

## 2022-10-16 NOTE — ED Triage Notes (Signed)
First nurse note:  Pt here for syncoapl episode.   106/73 HR: 91 96%  20G R bicep

## 2022-10-22 ENCOUNTER — Emergency Department
Admission: EM | Admit: 2022-10-22 | Discharge: 2022-10-22 | Disposition: A | Discharge status: Skilled Nursing Facility | Payer: 59 | Attending: Emergency Medicine | Admitting: Emergency Medicine

## 2022-10-22 ENCOUNTER — Emergency Department: Payer: 59

## 2022-10-22 ENCOUNTER — Other Ambulatory Visit: Payer: Self-pay

## 2022-10-22 DIAGNOSIS — S0240EA Zygomatic fracture, right side, initial encounter for closed fracture: Secondary | ICD-10-CM | POA: Diagnosis not present

## 2022-10-22 DIAGNOSIS — F0390 Unspecified dementia without behavioral disturbance: Secondary | ICD-10-CM | POA: Insufficient documentation

## 2022-10-22 DIAGNOSIS — W19XXXA Unspecified fall, initial encounter: Secondary | ICD-10-CM | POA: Diagnosis not present

## 2022-10-22 DIAGNOSIS — Y92129 Unspecified place in nursing home as the place of occurrence of the external cause: Secondary | ICD-10-CM | POA: Diagnosis not present

## 2022-10-22 DIAGNOSIS — S0511XA Contusion of eyeball and orbital tissues, right eye, initial encounter: Secondary | ICD-10-CM | POA: Diagnosis not present

## 2022-10-22 DIAGNOSIS — R339 Retention of urine, unspecified: Secondary | ICD-10-CM | POA: Insufficient documentation

## 2022-10-22 DIAGNOSIS — S0181XA Laceration without foreign body of other part of head, initial encounter: Secondary | ICD-10-CM | POA: Diagnosis not present

## 2022-10-22 DIAGNOSIS — S0993XA Unspecified injury of face, initial encounter: Secondary | ICD-10-CM | POA: Diagnosis present

## 2022-10-22 LAB — COMPREHENSIVE METABOLIC PANEL
ALT: 9 U/L (ref 0–44)
AST: 26 U/L (ref 15–41)
Albumin: 3.5 g/dL (ref 3.5–5.0)
Alkaline Phosphatase: 85 U/L (ref 38–126)
Anion gap: 12 (ref 5–15)
BUN: 14 mg/dL (ref 8–23)
CO2: 27 mmol/L (ref 22–32)
Calcium: 9.2 mg/dL (ref 8.9–10.3)
Chloride: 104 mmol/L (ref 98–111)
Creatinine, Ser: 0.92 mg/dL (ref 0.61–1.24)
GFR, Estimated: >60 mL/min (ref >60)
Glucose, Bld: 159 mg/dL — ABNORMAL HIGH (ref 70–99)
Potassium: 3.2 mmol/L — ABNORMAL LOW (ref 3.5–5.1)
Sodium: 143 mmol/L (ref 135–145)
Total Bilirubin: 1.1 mg/dL (ref 0.3–1.2)
Total Protein: 7 g/dL (ref 6.5–8.1)

## 2022-10-22 LAB — CBC WITH DIFFERENTIAL/PLATELET
Abs Immature Granulocytes: 0.05 10*3/uL (ref 0.00–0.07)
Basophils Absolute: 0 10*3/uL (ref 0.0–0.1)
Basophils Relative: 0 %
Eosinophils Absolute: 0 10*3/uL (ref 0.0–0.5)
Eosinophils Relative: 0 %
HCT: 38 % — ABNORMAL LOW (ref 39.0–52.0)
Hemoglobin: 12 g/dL — ABNORMAL LOW (ref 13.0–17.0)
Immature Granulocytes: 1 %
Lymphocytes Relative: 30 %
Lymphs Abs: 3 10*3/uL (ref 0.7–4.0)
MCH: 30.6 pg (ref 26.0–34.0)
MCHC: 31.6 g/dL (ref 30.0–36.0)
MCV: 96.9 fL (ref 80.0–100.0)
Monocytes Absolute: 0.7 10*3/uL (ref 0.1–1.0)
Monocytes Relative: 7 %
Neutro Abs: 6.3 10*3/uL (ref 1.7–7.7)
Neutrophils Relative %: 62 %
Platelets: 315 10*3/uL (ref 150–400)
RBC: 3.92 MIL/uL — ABNORMAL LOW (ref 4.22–5.81)
RDW: 16.9 % — ABNORMAL HIGH (ref 11.5–15.5)
WBC: 10.1 10*3/uL (ref 4.0–10.5)
nRBC: 0 % (ref 0.0–0.2)

## 2022-10-22 LAB — URINALYSIS, ROUTINE W REFLEX MICROSCOPIC
Bilirubin Urine: NEGATIVE
Glucose, UA: NEGATIVE mg/dL
Hgb urine dipstick: NEGATIVE
Ketones, ur: NEGATIVE mg/dL
Leukocytes,Ua: NEGATIVE
Nitrite: NEGATIVE
Protein, ur: NEGATIVE mg/dL
Specific Gravity, Urine: 1.016 (ref 1.005–1.030)
pH: 5 (ref 5.0–8.0)

## 2022-10-22 LAB — CK: Total CK: 111 U/L (ref 49–397)

## 2022-10-22 NOTE — ED Notes (Signed)
Pt was catheterized. EDP shown urine. Urine is tea colored. 1100 mL urine removed with catheter. Will document in output.

## 2022-10-22 NOTE — ED Provider Triage Note (Signed)
  Emergency Medicine Provider Triage Evaluation Note  Ian Medina , a 66 y.o.male,  was evaluated in triage.  Pt complains of injuries from fall.  History of dementia.  He presents via EMS from being any family care.  He has noticeable ecchymosis around the right eye.  His current complaint of left-sided eye pain as well.  He states that he has not feeling pain anywhere else at this time   Review of Systems  Positive: Fall/head injury Negative: Denies fever, chest pain, vomiting  Physical Exam   Vitals:   10/22/22 1335  BP: (!) 132/107  Pulse: 68  Resp: 18  Temp: 98.4 F (36.9 C)  SpO2: 97%   Gen:   Awake, no distress   Resp:  Normal effort  MSK:   Moves extremities without difficulty  Other:  Ecchymosis present around the right orbit.  Medical Decision Making  Given the patient's initial medical screening exam, the following diagnostic evaluation has been ordered. The patient will be placed in the appropriate treatment space, once one is available, to complete the evaluation and treatment. I have discussed the plan of care with the patient and I have advised the patient that an ED physician or mid-level practitioner will reevaluate their condition after the test results have been received, as the results may give them additional insight into the type of treatment they may need.    Diagnostics: Head CT, cervical spine CT, maxillofacial CT, labs, UA  Treatments: none immediately   Varney Daily, [place] 10/22/22 1336

## 2022-10-22 NOTE — ED Notes (Signed)
Tried to call [facility] group home at [phone]. Voice mailbox was full. Will try to call back.

## 2022-10-22 NOTE — ED Notes (Signed)
Tried to call group home again to see if can come pick pt up. No answer, voicemail full.

## 2022-10-22 NOTE — ED Notes (Signed)
Bed alarm placed. Need to call group home

## 2022-10-22 NOTE — ED Notes (Signed)
Pt tongue noted to be furrowed. Helped pt eat applesauce and pt is sipping water, so far.

## 2022-10-22 NOTE — ED Notes (Signed)
Pt attempting to get OOB to go to bathroom. Helped pt to standing next to bed to use urinal. Pt unable to void. Pt back in bed. Pt then thought urinal was water to drink. Told pt this is the jug to urinate into, not water.

## 2022-10-22 NOTE — ED Triage Notes (Signed)
Pt arrives via EMS for a fall- pt has dementia- pt has injuries from a fall a few days ago that he was seen here for

## 2022-10-22 NOTE — ED Provider Notes (Signed)
[facility] Provider Note    Event Date/Time   First MD Initiated Contact with Patient 10/22/22 1614     (approximate)   History   Fall   HPI  Ian Medina is a 66 y.o. male with past medical history dementia, who presents to the emergency department following a fall.  Patient is uncertain of what caused his fall.  Lives at a nursing facility.  Patient without complaints at this time.  Recent visit for a fall.  History of dementia and has a baseline confusion.  Not on anticoagulation.  Patient denies any neck pain or back pain.     Physical Exam   Triage Vital Signs: ED Triage Vitals [10/22/22 1335]  Enc Vitals Group     BP (!) 132/107     Pulse Rate 68     Resp 18     Temp 98.4 F (36.9 C)     Temp Source Oral     SpO2 97 %     Weight      Height      Head Circumference      Peak Flow      Pain Score      Pain Loc      Pain Edu?      Excl. in GC?     Most recent vital signs: Vitals:   10/22/22 1617 10/22/22 1754  BP: 96/74   Pulse: 100   Resp: 16   Temp:  98.1 F (36.7 C)  SpO2: 96%     Physical Exam HENT:     Head:     Comments: Abrasion to the right side of the face with ecchymosis around the eye.  Superficial laceration that is hemostatic without abrasion to the right side forehead.  No septal hematoma.    Right Ear: External ear normal.     Left Ear: External ear normal.  Eyes:     Extraocular Movements: Extraocular movements intact.     Pupils: Pupils are equal, round, and reactive to light.     Comments: Extraocular movements intact with no nystagmus and no entrapment.  Cardiovascular:     Rate and Rhythm: Normal rate.  Pulmonary:     Effort: Pulmonary effort is normal.  Abdominal:     General: There is no distension.  Musculoskeletal:        General: Normal range of motion.     Comments: No midline thoracic or lumbar tenderness to palpation.  Standing in the emergency department  Skin:    General: Skin is warm.   Neurological:     Mental Status: He is alert. Mental status is at baseline.          IMPRESSION / MDM / ASSESSMENT AND PLAN / ED COURSE  I reviewed the triage vital signs and the nursing notes.  Differential diagnosis including intracranial hemorrhage, concussion, electrolyte abnormality, facial fracture  On chart review patient has had multiple ED visits for falls and last had a hospitalization at the beginning of November.  Patient was found to have pneumonia and rib fractures at that visit.  Was discharged to a SNF.  Patient doing to urinate multiple times but unable to.  In-N-Out catheterization with urinary retention and had approximately 1 L of urine that was dark.  Able to tolerate p.o.  Unable to urinate on his own.  Concern for urinary retention.  No signs of urinary tract infection.  No signs of rhabdomyolysis.  RADIOLOGY I independently reviewed imaging, my  interpretation of imaging: CT head showed no signs of intracranial hemorrhage.  CT scan of the head was read as no acute findings.  CT scan of the cervical spine was read as no acute findings.  CT scan of the face showed right-sided zygomatic arch fractures.      ED Results / Procedures / Treatments   Labs (all labs ordered are listed, but only abnormal results are displayed) Labs interpreted as -    Labs Reviewed  CBC WITH DIFFERENTIAL/PLATELET - Abnormal; Notable for the following components:      Result Value   RBC 3.92 (*)    Hemoglobin 12.0 (*)    HCT 38.0 (*)    RDW 16.9 (*)    All other components within normal limits  COMPREHENSIVE METABOLIC PANEL - Abnormal; Notable for the following components:   Potassium 3.2 (*)    Glucose, Bld 159 (*)    All other components within normal limits  URINALYSIS, ROUTINE W REFLEX MICROSCOPIC - Abnormal; Notable for the following components:   Color, Urine AMBER (*)    APPearance CLEAR (*)    All other components within normal limits  CK   No signs of an  infectious process.  No significant electrolyte abnormalities.  UA without signs of urinary tract infection.  No signs of entrapment.  Patient is at mental status baseline.  Stable to be discharged back to nursing facility.  Given information to follow-up with ENT.  Given return precautions.  Given patient's concern for urinary retention, uncertain etiology, possible BPH, Foley catheter placed with plan to follow-up as an outpatient with urology for urinary retention.    PROCEDURES:  Critical Care performed: No  Procedures  Patient's presentation is most consistent with acute presentation with potential threat to life or bodily function.   MEDICATIONS ORDERED IN ED: Medications - No data to display  FINAL CLINICAL IMPRESSION(S) / ED DIAGNOSES   Final diagnoses:  Fall, initial encounter  Closed fracture of right zygomatic arch, initial encounter [facility])  Urinary retention     Rx / DC Orders   ED Discharge Orders     None        Note:  This document was prepared using Dragon voice recognition software and may include unintentional dictation errors.   Corena Herter, MD 10/22/22 [phone]

## 2022-10-22 NOTE — ED Notes (Signed)
Pt to ED for multiple falls. Bruising noted to both orbits. Pt got up and walked to bathroom intriage. Provided urinal, pt said needed to void but did not void in urinal. Name of group home already noted in triage/first RN note. Pt confused.

## 2022-10-22 NOTE — ED Triage Notes (Signed)
[facility] [phone]

## 2022-10-22 NOTE — ED Notes (Signed)
Spoke with [name], legal guardian. She will let [facility] know that pt will discharge with foley and come back EMS. She said no need to check back.

## 2022-10-22 NOTE — ED Notes (Signed)
DC pt to EMS for transport back to facility

## 2022-10-22 NOTE — ED Triage Notes (Signed)
Pt presents via EMS c/o fall at [facility]. C/o left eye pain. Hx dementia. Reports right eye bruised due to last fall.

## 2022-10-22 NOTE — ED Notes (Addendum)
Group home called back. Told RN to call [phone] is number for Jarvis Morgan, who is grouphome owner, to see if they can pick up. Called this number. She said no transportation is available at this time. Will proceed with EMS.

## 2022-10-22 NOTE — Discharge Instructions (Addendum)
Evaluated following a fall.  CT scan of the face showed a facial fracture to the right side.  Usually these heal on their own and do not need any surgery.  You are given information to follow-up with the ear nose and throat specialist.  You can also follow-up with your primary care physician.   While in the emergency department found to be retaining urine and had approximately 1 L of urine in his bladder.  No signs of urinary tract infection.  Unable to void on his own.  Foley catheter was placed.  It is important that he follows up with the urologist and primary care physician to do a voiding trial for urinary retention.   Pain control:  Ibuprofen (motrin/aleve/advil) - You can take 3-4 tablets (600-800 mg) every 6 hours as needed for pain/fever.  Acetaminophen (tylenol) - You can take 2 extra strength tablets (1000 mg) every 6 hours as needed for pain/fever.  You can alternate these medications or take them together.  Make sure you eat food/drink water when taking these medications.

## 2022-10-22 NOTE — ED Notes (Signed)
Called lab to have CK added on to previous specimen. They will add.

## 2022-10-23 ENCOUNTER — Encounter: Payer: Self-pay | Admitting: Emergency Medicine

## 2022-10-23 DIAGNOSIS — S0011XA Contusion of right eyelid and periocular area, initial encounter: Secondary | ICD-10-CM | POA: Diagnosis present

## 2022-10-23 DIAGNOSIS — Z1152 Encounter for screening for COVID-19: Secondary | ICD-10-CM

## 2022-10-23 DIAGNOSIS — K219 Gastro-esophageal reflux disease without esophagitis: Secondary | ICD-10-CM | POA: Diagnosis present

## 2022-10-23 DIAGNOSIS — Z681 Body mass index (BMI) 19 or less, adult: Secondary | ICD-10-CM

## 2022-10-23 DIAGNOSIS — G9341 Metabolic encephalopathy: Secondary | ICD-10-CM | POA: Diagnosis present

## 2022-10-23 DIAGNOSIS — R627 Adult failure to thrive: Secondary | ICD-10-CM | POA: Diagnosis present

## 2022-10-23 DIAGNOSIS — F251 Schizoaffective disorder, depressive type: Secondary | ICD-10-CM | POA: Diagnosis present

## 2022-10-23 DIAGNOSIS — N390 Urinary tract infection, site not specified: Secondary | ICD-10-CM | POA: Diagnosis present

## 2022-10-23 DIAGNOSIS — F411 Generalized anxiety disorder: Secondary | ICD-10-CM | POA: Diagnosis present

## 2022-10-23 DIAGNOSIS — R6520 Severe sepsis without septic shock: Secondary | ICD-10-CM | POA: Diagnosis not present

## 2022-10-23 DIAGNOSIS — S0240EA Zygomatic fracture, right side, initial encounter for closed fracture: Secondary | ICD-10-CM | POA: Diagnosis present

## 2022-10-23 DIAGNOSIS — B952 Enterococcus as the cause of diseases classified elsewhere: Secondary | ICD-10-CM | POA: Diagnosis present

## 2022-10-23 DIAGNOSIS — A419 Sepsis, unspecified organism: Secondary | ICD-10-CM | POA: Diagnosis not present

## 2022-10-23 DIAGNOSIS — F70 Mild intellectual disabilities: Secondary | ICD-10-CM | POA: Diagnosis present

## 2022-10-23 DIAGNOSIS — Z825 Family history of asthma and other chronic lower respiratory diseases: Secondary | ICD-10-CM

## 2022-10-23 DIAGNOSIS — Z87891 Personal history of nicotine dependence: Secondary | ICD-10-CM

## 2022-10-23 DIAGNOSIS — N401 Enlarged prostate with lower urinary tract symptoms: Secondary | ICD-10-CM | POA: Diagnosis present

## 2022-10-23 DIAGNOSIS — R338 Other retention of urine: Secondary | ICD-10-CM | POA: Diagnosis present

## 2022-10-23 DIAGNOSIS — I700 Atherosclerosis of aorta: Secondary | ICD-10-CM | POA: Diagnosis present

## 2022-10-23 DIAGNOSIS — I671 Cerebral aneurysm, nonruptured: Secondary | ICD-10-CM | POA: Diagnosis present

## 2022-10-23 DIAGNOSIS — T83511A Infection and inflammatory reaction due to indwelling urethral catheter, initial encounter: Secondary | ICD-10-CM | POA: Diagnosis not present

## 2022-10-23 DIAGNOSIS — J690 Pneumonitis due to inhalation of food and vomit: Secondary | ICD-10-CM | POA: Diagnosis present

## 2022-10-23 DIAGNOSIS — Z79899 Other long term (current) drug therapy: Secondary | ICD-10-CM

## 2022-10-23 DIAGNOSIS — R296 Repeated falls: Secondary | ICD-10-CM | POA: Diagnosis present

## 2022-10-23 DIAGNOSIS — R1312 Dysphagia, oropharyngeal phase: Secondary | ICD-10-CM | POA: Diagnosis present

## 2022-10-23 DIAGNOSIS — I10 Essential (primary) hypertension: Secondary | ICD-10-CM | POA: Diagnosis present

## 2022-10-23 DIAGNOSIS — E43 Unspecified severe protein-calorie malnutrition: Secondary | ICD-10-CM | POA: Diagnosis present

## 2022-10-23 DIAGNOSIS — R4182 Altered mental status, unspecified: Secondary | ICD-10-CM | POA: Diagnosis not present

## 2022-10-23 DIAGNOSIS — I959 Hypotension, unspecified: Secondary | ICD-10-CM | POA: Diagnosis not present

## 2022-10-23 DIAGNOSIS — Z7982 Long term (current) use of aspirin: Secondary | ICD-10-CM

## 2022-10-23 DIAGNOSIS — Z1623 Resistance to quinolones and fluoroquinolones: Secondary | ICD-10-CM | POA: Diagnosis present

## 2022-10-23 DIAGNOSIS — E7800 Pure hypercholesterolemia, unspecified: Secondary | ICD-10-CM | POA: Diagnosis present

## 2022-10-23 DIAGNOSIS — Y846 Urinary catheterization as the cause of abnormal reaction of the patient, or of later complication, without mention of misadventure at the time of the procedure: Secondary | ICD-10-CM | POA: Diagnosis present

## 2022-10-23 DIAGNOSIS — R310 Gross hematuria: Secondary | ICD-10-CM | POA: Diagnosis present

## 2022-10-23 DIAGNOSIS — R471 Dysarthria and anarthria: Secondary | ICD-10-CM | POA: Diagnosis present

## 2022-10-23 DIAGNOSIS — Z7951 Long term (current) use of inhaled steroids: Secondary | ICD-10-CM

## 2022-10-23 DIAGNOSIS — Z79891 Long term (current) use of opiate analgesic: Secondary | ICD-10-CM

## 2022-10-23 DIAGNOSIS — E876 Hypokalemia: Secondary | ICD-10-CM | POA: Diagnosis present

## 2022-10-23 DIAGNOSIS — Z888 Allergy status to other drugs, medicaments and biological substances status: Secondary | ICD-10-CM

## 2022-10-23 DIAGNOSIS — B9561 Methicillin susceptible Staphylococcus aureus infection as the cause of diseases classified elsewhere: Secondary | ICD-10-CM | POA: Diagnosis present

## 2022-10-23 DIAGNOSIS — J439 Emphysema, unspecified: Secondary | ICD-10-CM | POA: Diagnosis present

## 2022-10-23 NOTE — ED Triage Notes (Signed)
Pt presents via EMS from [facility] for AMS. Pt has dementia, unsure why he is here. Seen here yesterday for a fall. Pt Alert to self.  Denies CP or SOB.

## 2022-10-24 ENCOUNTER — Encounter: Payer: Self-pay | Admitting: Internal Medicine

## 2022-10-24 ENCOUNTER — Other Ambulatory Visit: Payer: Self-pay

## 2022-10-24 ENCOUNTER — Emergency Department: Payer: 59

## 2022-10-24 ENCOUNTER — Inpatient Hospital Stay
Admission: EM | Admit: 2022-10-24 | Discharge: 2022-11-09 | DRG: 698 | Disposition: A | Discharge status: Skilled Nursing Facility | Payer: 59 | Attending: Osteopathic Medicine | Admitting: Osteopathic Medicine

## 2022-10-24 DIAGNOSIS — F250 Schizoaffective disorder, bipolar type: Secondary | ICD-10-CM | POA: Diagnosis not present

## 2022-10-24 DIAGNOSIS — Z515 Encounter for palliative care: Secondary | ICD-10-CM | POA: Diagnosis not present

## 2022-10-24 DIAGNOSIS — I951 Orthostatic hypotension: Secondary | ICD-10-CM | POA: Diagnosis present

## 2022-10-24 DIAGNOSIS — K219 Gastro-esophageal reflux disease without esophagitis: Secondary | ICD-10-CM | POA: Diagnosis present

## 2022-10-24 DIAGNOSIS — I700 Atherosclerosis of aorta: Secondary | ICD-10-CM | POA: Diagnosis present

## 2022-10-24 DIAGNOSIS — W19XXXD Unspecified fall, subsequent encounter: Secondary | ICD-10-CM | POA: Diagnosis not present

## 2022-10-24 DIAGNOSIS — J690 Pneumonitis due to inhalation of food and vomit: Secondary | ICD-10-CM | POA: Diagnosis not present

## 2022-10-24 DIAGNOSIS — R4182 Altered mental status, unspecified: Secondary | ICD-10-CM | POA: Diagnosis present

## 2022-10-24 DIAGNOSIS — R1313 Dysphagia, pharyngeal phase: Secondary | ICD-10-CM | POA: Diagnosis not present

## 2022-10-24 DIAGNOSIS — E876 Hypokalemia: Secondary | ICD-10-CM | POA: Diagnosis present

## 2022-10-24 DIAGNOSIS — R296 Repeated falls: Secondary | ICD-10-CM | POA: Diagnosis present

## 2022-10-24 DIAGNOSIS — E43 Unspecified severe protein-calorie malnutrition: Secondary | ICD-10-CM | POA: Diagnosis present

## 2022-10-24 DIAGNOSIS — R338 Other retention of urine: Secondary | ICD-10-CM | POA: Diagnosis not present

## 2022-10-24 DIAGNOSIS — F251 Schizoaffective disorder, depressive type: Secondary | ICD-10-CM | POA: Diagnosis present

## 2022-10-24 DIAGNOSIS — N390 Urinary tract infection, site not specified: Secondary | ICD-10-CM | POA: Diagnosis present

## 2022-10-24 DIAGNOSIS — J439 Emphysema, unspecified: Secondary | ICD-10-CM | POA: Diagnosis present

## 2022-10-24 DIAGNOSIS — N401 Enlarged prostate with lower urinary tract symptoms: Secondary | ICD-10-CM | POA: Diagnosis present

## 2022-10-24 DIAGNOSIS — G9341 Metabolic encephalopathy: Secondary | ICD-10-CM | POA: Diagnosis present

## 2022-10-24 DIAGNOSIS — Y846 Urinary catheterization as the cause of abnormal reaction of the patient, or of later complication, without mention of misadventure at the time of the procedure: Secondary | ICD-10-CM | POA: Diagnosis present

## 2022-10-24 DIAGNOSIS — Z1623 Resistance to quinolones and fluoroquinolones: Secondary | ICD-10-CM | POA: Diagnosis present

## 2022-10-24 DIAGNOSIS — E7800 Pure hypercholesterolemia, unspecified: Secondary | ICD-10-CM | POA: Diagnosis present

## 2022-10-24 DIAGNOSIS — I671 Cerebral aneurysm, nonruptured: Secondary | ICD-10-CM | POA: Diagnosis present

## 2022-10-24 DIAGNOSIS — R627 Adult failure to thrive: Secondary | ICD-10-CM | POA: Diagnosis present

## 2022-10-24 DIAGNOSIS — R1310 Dysphagia, unspecified: Secondary | ICD-10-CM

## 2022-10-24 DIAGNOSIS — W19XXXA Unspecified fall, initial encounter: Secondary | ICD-10-CM | POA: Diagnosis present

## 2022-10-24 DIAGNOSIS — R6520 Severe sepsis without septic shock: Secondary | ICD-10-CM | POA: Diagnosis not present

## 2022-10-24 DIAGNOSIS — S0240EA Zygomatic fracture, right side, initial encounter for closed fracture: Secondary | ICD-10-CM | POA: Diagnosis present

## 2022-10-24 DIAGNOSIS — I10 Essential (primary) hypertension: Secondary | ICD-10-CM | POA: Diagnosis present

## 2022-10-24 DIAGNOSIS — I959 Hypotension, unspecified: Secondary | ICD-10-CM | POA: Diagnosis present

## 2022-10-24 DIAGNOSIS — A419 Sepsis, unspecified organism: Secondary | ICD-10-CM | POA: Diagnosis not present

## 2022-10-24 DIAGNOSIS — R634 Abnormal weight loss: Secondary | ICD-10-CM | POA: Diagnosis present

## 2022-10-24 DIAGNOSIS — Z7189 Other specified counseling: Secondary | ICD-10-CM | POA: Diagnosis not present

## 2022-10-24 DIAGNOSIS — R4189 Other symptoms and signs involving cognitive functions and awareness: Secondary | ICD-10-CM | POA: Diagnosis present

## 2022-10-24 DIAGNOSIS — Z1152 Encounter for screening for COVID-19: Secondary | ICD-10-CM | POA: Diagnosis not present

## 2022-10-24 DIAGNOSIS — Z681 Body mass index (BMI) 19 or less, adult: Secondary | ICD-10-CM | POA: Diagnosis not present

## 2022-10-24 DIAGNOSIS — B9561 Methicillin susceptible Staphylococcus aureus infection as the cause of diseases classified elsewhere: Secondary | ICD-10-CM | POA: Diagnosis present

## 2022-10-24 DIAGNOSIS — T83511A Infection and inflammatory reaction due to indwelling urethral catheter, initial encounter: Secondary | ICD-10-CM | POA: Diagnosis present

## 2022-10-24 DIAGNOSIS — R0609 Other forms of dyspnea: Secondary | ICD-10-CM | POA: Diagnosis not present

## 2022-10-24 LAB — URINALYSIS, ROUTINE W REFLEX MICROSCOPIC
RBC / HPF: >50 RBC/hpf — ABNORMAL HIGH (ref 0–5)
Specific Gravity, Urine: 1.021 (ref 1.005–1.030)
Squamous Epithelial / HPF: NONE SEEN (ref 0–5)
WBC, UA: NONE SEEN WBC/hpf (ref 0–5)

## 2022-10-24 LAB — TROPONIN I (HIGH SENSITIVITY)
Troponin I (High Sensitivity): 5 ng/L (ref <18)
Troponin I (High Sensitivity): 5 ng/L (ref <18)

## 2022-10-24 LAB — COMPREHENSIVE METABOLIC PANEL
ALT: 7 U/L (ref 0–44)
AST: 16 U/L (ref 15–41)
Albumin: 3.1 g/dL — ABNORMAL LOW (ref 3.5–5.0)
Alkaline Phosphatase: 79 U/L (ref 38–126)
Anion gap: 7 (ref 5–15)
BUN: 15 mg/dL (ref 8–23)
CO2: 30 mmol/L (ref 22–32)
Calcium: 8.4 mg/dL — ABNORMAL LOW (ref 8.9–10.3)
Chloride: 103 mmol/L (ref 98–111)
Creatinine, Ser: 0.76 mg/dL (ref 0.61–1.24)
GFR, Estimated: >60 mL/min (ref >60)
Glucose, Bld: 122 mg/dL — ABNORMAL HIGH (ref 70–99)
Potassium: 3.9 mmol/L (ref 3.5–5.1)
Sodium: 140 mmol/L (ref 135–145)
Total Bilirubin: 1 mg/dL (ref 0.3–1.2)
Total Protein: 6.1 g/dL — ABNORMAL LOW (ref 6.5–8.1)

## 2022-10-24 LAB — CBC WITH DIFFERENTIAL/PLATELET
Abs Immature Granulocytes: 0.05 10*3/uL (ref 0.00–0.07)
Basophils Absolute: 0 10*3/uL (ref 0.0–0.1)
Basophils Relative: 0 %
Eosinophils Absolute: 0 10*3/uL (ref 0.0–0.5)
Eosinophils Relative: 0 %
HCT: 31.5 % — ABNORMAL LOW (ref 39.0–52.0)
Hemoglobin: 10.1 g/dL — ABNORMAL LOW (ref 13.0–17.0)
Immature Granulocytes: 0 %
Lymphocytes Relative: 26 %
Lymphs Abs: 3 10*3/uL (ref 0.7–4.0)
MCH: 30.7 pg (ref 26.0–34.0)
MCHC: 32.1 g/dL (ref 30.0–36.0)
MCV: 95.7 fL (ref 80.0–100.0)
Monocytes Absolute: 0.8 10*3/uL (ref 0.1–1.0)
Monocytes Relative: 7 %
Neutro Abs: 7.8 10*3/uL — ABNORMAL HIGH (ref 1.7–7.7)
Neutrophils Relative %: 67 %
Platelets: 232 10*3/uL (ref 150–400)
RBC: 3.29 MIL/uL — ABNORMAL LOW (ref 4.22–5.81)
RDW: 16.4 % — ABNORMAL HIGH (ref 11.5–15.5)
WBC: 11.7 10*3/uL — ABNORMAL HIGH (ref 4.0–10.5)
nRBC: 0 % (ref 0.0–0.2)

## 2022-10-24 LAB — URINE DRUG SCREEN, QUALITATIVE (ARMC ONLY)
Amphetamines, Ur Screen: NOT DETECTED
Barbiturates, Ur Screen: NOT DETECTED
Benzodiazepine, Ur Scrn: NOT DETECTED
Cannabinoid 50 Ng, Ur ~~LOC~~: NOT DETECTED
Cocaine Metabolite,Ur ~~LOC~~: NOT DETECTED
MDMA (Ecstasy)Ur Screen: NOT DETECTED
Methadone Scn, Ur: NOT DETECTED
Opiate, Ur Screen: NOT DETECTED
Phencyclidine (PCP) Ur S: NOT DETECTED
Tricyclic, Ur Screen: POSITIVE — AB

## 2022-10-24 LAB — VITAMIN B12: Vitamin B-12: 370 pg/mL (ref 180–914)

## 2022-10-24 LAB — ETHANOL: Alcohol, Ethyl (B): <10 mg/dL (ref <10)

## 2022-10-24 LAB — SARS CORONAVIRUS 2 BY RT PCR: SARS Coronavirus 2 by RT PCR: NEGATIVE

## 2022-10-24 LAB — PROTIME-INR
INR: 1.2 (ref 0.8–1.2)
Prothrombin Time: 14.6 seconds (ref 11.4–15.2)

## 2022-10-24 LAB — AMMONIA: Ammonia: 25 umol/L (ref 9–35)

## 2022-10-24 LAB — RPR: RPR Ser Ql: NONREACTIVE

## 2022-10-24 MED ORDER — FLUTICASONE FUROATE-VILANTEROL 100-25 MCG/ACT IN AEPB
1.0000 | INHALATION_SPRAY | Freq: Every day | RESPIRATORY_TRACT | Status: DC
  Administered 2022-10-25 – 2022-11-09 (×16): 1 via RESPIRATORY_TRACT
  Filled 2022-10-24 (×2): qty 28

## 2022-10-24 MED ORDER — PRAVASTATIN SODIUM 20 MG PO TABS
20.0000 mg | ORAL_TABLET | Freq: Every day | ORAL | Status: DC
  Administered 2022-10-24 – 2022-11-08 (×13): 20 mg via ORAL
  Filled 2022-10-24 (×15): qty 1

## 2022-10-24 MED ORDER — GABAPENTIN 100 MG PO CAPS
100.0000 mg | ORAL_CAPSULE | Freq: Three times a day (TID) | ORAL | Status: DC
  Administered 2022-10-24 – 2022-11-09 (×46): 100 mg via ORAL
  Filled 2022-10-24 (×48): qty 1

## 2022-10-24 MED ORDER — CHLORPROMAZINE HCL 25 MG PO TABS
25.0000 mg | ORAL_TABLET | Freq: Every day | ORAL | Status: DC
  Administered 2022-10-25 – 2022-11-09 (×16): 25 mg via ORAL
  Filled 2022-10-24 (×17): qty 1

## 2022-10-24 MED ORDER — BENZTROPINE MESYLATE 1 MG PO TABS
1.0000 mg | ORAL_TABLET | Freq: Three times a day (TID) | ORAL | Status: DC
  Administered 2022-10-24: 1 mg via ORAL
  Filled 2022-10-24: qty 1

## 2022-10-24 MED ORDER — UMECLIDINIUM BROMIDE 62.5 MCG/ACT IN AEPB
1.0000 | INHALATION_SPRAY | Freq: Every day | RESPIRATORY_TRACT | Status: DC
  Administered 2022-10-25 – 2022-11-09 (×16): 1 via RESPIRATORY_TRACT
  Filled 2022-10-24 (×2): qty 7

## 2022-10-24 MED ORDER — ONDANSETRON HCL 4 MG/2ML IJ SOLN: 4.0000 mg | Freq: Four times a day (QID) | INTRAMUSCULAR | Status: DC | PRN

## 2022-10-24 MED ORDER — ACETAMINOPHEN 325 MG PO TABS
650.0000 mg | ORAL_TABLET | Freq: Four times a day (QID) | ORAL | Status: DC | PRN
  Administered 2022-10-26: 650 mg via ORAL
  Filled 2022-10-24: qty 2

## 2022-10-24 MED ORDER — ACETAMINOPHEN 650 MG RE SUPP: 650.0000 mg | Freq: Four times a day (QID) | RECTAL | Status: DC | PRN

## 2022-10-24 MED ORDER — VALBENAZINE TOSYLATE 40 MG PO CAPS
40.0000 mg | ORAL_CAPSULE | Freq: Every day | ORAL | Status: DC
  Administered 2022-10-24 – 2022-11-09 (×17): 40 mg via ORAL
  Filled 2022-10-24 (×18): qty 1

## 2022-10-24 MED ORDER — HALOPERIDOL LACTATE 5 MG/ML IJ SOLN
1.0000 mg | Freq: Four times a day (QID) | INTRAMUSCULAR | Status: DC | PRN
  Administered 2022-10-24 – 2022-10-25 (×2): 1 mg via INTRAVENOUS
  Filled 2022-10-24 (×2): qty 1

## 2022-10-24 MED ORDER — OLANZAPINE 10 MG PO TABS
20.0000 mg | ORAL_TABLET | Freq: Every day | ORAL | Status: DC
  Administered 2022-10-24 – 2022-11-08 (×15): 20 mg via ORAL
  Filled 2022-10-24 (×18): qty 2

## 2022-10-24 MED ORDER — LEVETIRACETAM 500 MG PO TABS
500.0000 mg | ORAL_TABLET | Freq: Two times a day (BID) | ORAL | Status: DC
  Administered 2022-10-24 – 2022-11-09 (×32): 500 mg via ORAL
  Filled 2022-10-24 (×32): qty 1

## 2022-10-24 MED ORDER — TAMSULOSIN HCL 0.4 MG PO CAPS
0.4000 mg | ORAL_CAPSULE | Freq: Every evening | ORAL | Status: DC
  Administered 2022-10-24 – 2022-11-08 (×14): 0.4 mg via ORAL
  Filled 2022-10-24 (×16): qty 1

## 2022-10-24 MED ORDER — CHLORPROMAZINE HCL 50 MG PO TABS
50.0000 mg | ORAL_TABLET | Freq: Every day | ORAL | Status: DC
  Administered 2022-10-24: 50 mg via ORAL
  Filled 2022-10-24: qty 1

## 2022-10-24 MED ORDER — BUDESONIDE 0.25 MG/2ML IN SUSP: 0.2500 mg | Freq: Two times a day (BID) | RESPIRATORY_TRACT | Status: DC

## 2022-10-24 MED ORDER — PANTOPRAZOLE SODIUM 40 MG PO TBEC
40.0000 mg | DELAYED_RELEASE_TABLET | Freq: Every day | ORAL | Status: DC
  Administered 2022-10-24 – 2022-11-09 (×17): 40 mg via ORAL
  Filled 2022-10-24 (×17): qty 1

## 2022-10-24 MED ORDER — ONDANSETRON HCL 4 MG PO TABS: 4.0000 mg | ORAL_TABLET | Freq: Four times a day (QID) | ORAL | Status: DC | PRN

## 2022-10-24 MED ORDER — MONTELUKAST SODIUM 10 MG PO TABS
10.0000 mg | ORAL_TABLET | Freq: Every day | ORAL | Status: DC
  Administered 2022-10-24 – 2022-11-08 (×15): 10 mg via ORAL
  Filled 2022-10-24 (×15): qty 1

## 2022-10-24 MED ORDER — BENZTROPINE MESYLATE 1 MG PO TABS
0.5000 mg | ORAL_TABLET | Freq: Three times a day (TID) | ORAL | Status: DC
  Administered 2022-10-24 – 2022-10-27 (×7): 0.5 mg via ORAL
  Filled 2022-10-24 (×7): qty 1

## 2022-10-24 MED ORDER — FLUOXETINE HCL 20 MG PO CAPS
20.0000 mg | ORAL_CAPSULE | Freq: Every day | ORAL | Status: DC
  Administered 2022-10-24 – 2022-11-09 (×17): 20 mg via ORAL
  Filled 2022-10-24 (×17): qty 1

## 2022-10-24 MED ORDER — CHLORHEXIDINE GLUCONATE CLOTH 2 % EX PADS
6.0000 | MEDICATED_PAD | Freq: Every day | CUTANEOUS | Status: DC
  Administered 2022-10-24 – 2022-11-09 (×15): 6 via TOPICAL

## 2022-10-24 MED ORDER — TRAZODONE HCL 50 MG PO TABS
50.0000 mg | ORAL_TABLET | Freq: Every day | ORAL | Status: DC
  Administered 2022-10-24 – 2022-10-25 (×2): 50 mg via ORAL
  Filled 2022-10-24 (×2): qty 1

## 2022-10-24 MED ORDER — LACTATED RINGERS IV BOLUS
1000.0000 mL | Freq: Once | INTRAVENOUS | Status: AC
  Administered 2022-10-24: 1000 mL via INTRAVENOUS

## 2022-10-24 NOTE — Assessment & Plan Note (Addendum)
Patient presents for evaluation of multiple falls and was seen in the ER 2 days prior to this admission.  CT's during prior ED visit revealed partially covered segmental fracture of the right zygoma.  No acute intracranial or C-spine findings. --Fall precautions --PT evaluation  --Needs SNF/rehab, TOC following

## 2022-10-24 NOTE — ED Notes (Signed)
Legal guardian, [name], updated on plan of care by MD at this time.

## 2022-10-24 NOTE — ED Provider Notes (Signed)
[facility] Provider Note    Event Date/Time   First MD Initiated Contact with Patient 10/24/22 [phone]     (approximate)   History   Altered Mental Status   HPI  Traven Davids is a 66 y.o. male past medical history of schizoaffective disorder who presents with altered mental status.  I am unable to obtain any significant history from the patient then that he has no significant plaints currently.  He denies any falls since being seen in the hospital the other day.  Denies chest pain shortness of breath or abdominal pain denies nausea or vomiting.  Patient is currently residing in nursing facility.  Has been seen twice in the last week for falls in the ED.  Had CT head C-spine max face that did not show any acute pathology 2 days ago.  He had urinary retention in the hospital so had a Foley catheter placed.  Spoke with patient's legal guardian [name] who tells me that for about a week patient has been significantly off of his baseline he is more confused not recognizing her he has been somewhat agitated and was pulling at his Foley catheter today.  He has also had significant difficulty with ambulation and frequent falls. Past Medical History:  Diagnosis Date   Anxiety    Atherosclerosis of aorta (HCC)    Auditory hallucination    Cerebral aneurysm, nonruptured    COPD with emphysema (HCC)    Drug abuse (HCC)    Dysphagia, oropharyngeal phase    GAD (generalized anxiety disorder)    GERD (gastroesophageal reflux disease)    Hepatitis C    Hypercholesterolemia    Hypertension    Middle cerebral artery aneurysm    Mild intellectual disabilities    Paranoia (HCC)    Pneumonia 2017   Psoriasis    Psoriasis vulgaris    Repeated falls    Schizoaffective disorder, depressive type (HCC)    Schizophrenia (HCC)    diagnosed at age 56, treated by ACT team    Patient Active Problem List   Diagnosis Date Noted   Sepsis (HCC) 09/25/2022   Sepsis due to  pneumonia (HCC) 09/24/2022   Schizoaffective disorder, depressive type (HCC)    Falls    Multiple rib fractures    Aggressive behavior 06/03/2022   Drug abuse (HCC) 08/23/2016   Cognitive decline 06/08/2014   Dysphagia 05/11/2014   Hepatitis C antibody test positive 03/12/2013   Encounter for screening colonoscopy 03/12/2013     Physical Exam  Triage Vital Signs: ED Triage Vitals [10/23/22 1955]  Enc Vitals Group     BP 114/88     Pulse Rate 97     Resp 20     Temp 98.2 F (36.8 C)     Temp Source Oral     SpO2 99 %     Weight 151 lb 3.8 oz (68.6 kg)     Height 5\' 7"  (1.702 m)     Head Circumference      Peak Flow      Pain Score      Pain Loc      Pain Edu?      Excl. in GC?     Most recent vital signs: Vitals:   10/23/22 1955 10/24/22 0320  BP: 114/88 109/89  Pulse: 97 88  Resp: 20 20  Temp: 98.2 F (36.8 C) 97.8 F (36.6 C)  SpO2: 99% 98%     General: Awake, patient chronically ill-appearing skin  multiple old appearing abrasions on the face ecchymosis in the right periorbital region CV:  Good peripheral perfusion.  Resp:  Normal effort.  Abd:  No distention. Neuro:             Awake, Alert, Oriented x 3  Other:  Patient is very tremulous has difficulty following commands including finger-nose-finger does appear to move all of his extremities symmetrically and when asked multiple times he is able to answer orientations correctly but is dysarthric   ED Results / Procedures / Treatments  Labs (all labs ordered are listed, but only abnormal results are displayed) Labs Reviewed  URINALYSIS, ROUTINE W REFLEX MICROSCOPIC - Abnormal; Notable for the following components:      Result Value   Color, Urine RED (*)    APPearance CLOUDY (*)    Glucose, UA   (*)    Value: TEST NOT REPORTED DUE TO COLOR INTERFERENCE OF URINE PIGMENT   Hgb urine dipstick   (*)    Value: TEST NOT REPORTED DUE TO COLOR INTERFERENCE OF URINE PIGMENT   Bilirubin Urine   (*)     Value: TEST NOT REPORTED DUE TO COLOR INTERFERENCE OF URINE PIGMENT   Ketones, ur   (*)    Value: TEST NOT REPORTED DUE TO COLOR INTERFERENCE OF URINE PIGMENT   Protein, ur   (*)    Value: TEST NOT REPORTED DUE TO COLOR INTERFERENCE OF URINE PIGMENT   Nitrite   (*)    Value: TEST NOT REPORTED DUE TO COLOR INTERFERENCE OF URINE PIGMENT   Leukocytes,Ua   (*)    Value: TEST NOT REPORTED DUE TO COLOR INTERFERENCE OF URINE PIGMENT   RBC / HPF >50 (*)    Bacteria, UA RARE (*)    All other components within normal limits  CBC WITH DIFFERENTIAL/PLATELET - Abnormal; Notable for the following components:   WBC 11.7 (*)    RBC 3.29 (*)    Hemoglobin 10.1 (*)    HCT 31.5 (*)    RDW 16.4 (*)    Neutro Abs 7.8 (*)    All other components within normal limits  COMPREHENSIVE METABOLIC PANEL - Abnormal; Notable for the following components:   Glucose, Bld 122 (*)    Calcium 8.4 (*)    Total Protein 6.1 (*)    Albumin 3.1 (*)    All other components within normal limits  SARS CORONAVIRUS 2 BY RT PCR  URINE DRUG SCREEN, QUALITATIVE ([facility] ONLY)  TROPONIN I (HIGH SENSITIVITY)  TROPONIN I (HIGH SENSITIVITY)     EKG  EKG interpretation is limited by significant artifact, sinus rhythm normal axis normal intervals no acute ischemic changes given limitations with artifact   RADIOLOGY I reviewed and interpreted the CT scan of the brain which does not show any acute intracranial process    PROCEDURES:  Critical Care performed: No  Procedures     MEDICATIONS ORDERED IN ED: Medications  lactated ringers bolus 1,000 mL (0 mLs Intravenous Stopped 10/24/22 0559)     IMPRESSION / MDM / ASSESSMENT AND PLAN / ED COURSE  I reviewed the triage vital signs and the nursing notes.                              Patient's presentation is most consistent with acute presentation with potential threat to life or bodily function.  Differential diagnosis includes, but is not limited to, intracranial  hemorrhage, subdural, CVA, medication side  effect, electrolyte abnormality, renal failure, infection  The patient is a 66 year old male with underlying schizophrenia who presents because of altered mental status.  Patient has been seen several days recently for falls in the ED.  He presents today because of a change in mental status.  I spoke with the patient's legal guardian as the patient is not really able to provide much history at all.  She tells me that he has been declining for about a week been more confused has having difficulty ambulating and is not recognizing her.  She tells me typically he is pretty alert.   The patient has chronic appearing abrasions on his face he is quite tremulous and dysarthric.  He does however otherwise appear to have a nonfocal neurologic exam.  He has a Foley catheter in place and there is significant hematuria.  This was flushed and did significantly clear.  Apparently patient has been pulling at the Foley catheter was placed in the ED yesterday which could certainly cause the hematuria.   Patient's labs are overall reassuring does have a leukocytosis to almost 12, but also looks somewhat dry so I did give him a bolus of fluid.  His CMP and troponin are reassuring.  COVID test is negative obtained a chest x-ray which shows incomplete resolution of right airspace opacity for which she was admitted at the beginning of the month.  He is not hypoxic and denies shortness of breath or cough.  CT head was obtained again given change in mental status and there is no acute findings.  Legal guardian did tell me that his medications have been changed recently this Thorazine was increased as well as his valbenazine so this could be in part due to medication side effects and polypharmacy.  Regardless given frequent falls and the fact that he is not at his baseline I will admit him to the hospital service.      FINAL CLINICAL IMPRESSION(S) / ED DIAGNOSES   Final diagnoses:   Altered mental status, unspecified altered mental status type     Rx / DC Orders   ED Discharge Orders     None        Note:  This document was prepared using Dragon voice recognition software and may include unintentional dictation errors.   Georga Hacking, MD 10/24/22 [phone]

## 2022-10-24 NOTE — Assessment & Plan Note (Addendum)
Noted to have urinary retention during his last ER visit and had a Foley catheter placed. Presented this time with gross hematuria, suspect urethral trauma from insertion vs pt pulling on the Foley. PSA level is normal. 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 Urology aware. --Continue Flomax --Follow up with urology

## 2022-10-24 NOTE — Assessment & Plan Note (Addendum)
Most likely related to worsening infection, progression of pneumonia likely aspiration, also with UTI.  Presented with progressively worsening mental status changes - very confused and oriented only to person (per guardian states is unusual for him).  Concern for possible dementia in this patient who has CT scan findings suggestive of encephalomalacia in the left temporal lobe. Ammonia, B12, RPR level normal. UDS positive only for tricyclics. Covid negative. UA bloody, rare bacteria, unable to check nitrites or leukocytes.  Urine culture growing Staph aureus and Enterococcus faecalis CXR with incomplete resolution of airspace disease in the right lung (recently had PNA, currently without respiratory symptoms). --Follow cultures --Continue antibiotics as outlined --Psych consulted to review medications --Delirium precautions

## 2022-10-24 NOTE — Assessment & Plan Note (Signed)
Treatment as outlined in 1 

## 2022-10-24 NOTE — ED Notes (Signed)
Pts leg bag removed and replaced with drainage bag. Due to hematuria seen in previous UA, writer irrigated a total of 1L sterile water. Post irrigation void, clear from clots. MD made aware.

## 2022-10-24 NOTE — Consult Note (Signed)
[facility] Face-to-Face Psychiatry Consult   Reason for Consult:  confusion Referring Physician:  Agbata Patient Identification: Ian Medina MRN:  [id number] Principal Diagnosis: Acute metabolic encephalopathy Diagnosis:  Principal Problem:   Acute metabolic encephalopathy Active Problems:   Schizoaffective disorder, depressive type (HCC)   Falls   Cognitive decline   GERD (gastroesophageal reflux disease)   Urinary retention due to benign prostatic hyperplasia   Total Time spent with patient: 30 minutes  Subjective:   Ian Medina is a 66 y.o. male patient admitted with frequent falls and confusion.  HPI:  Patient has been to the ED twice prior for various medical problems. He has a Foley catheter in place at this time; was placed when he was here 2 days ago for urinary retention.   On approach, patient is able to tell Clinical research associate and student to "come on in." He is laying in bed is alert and oriented to self only. He is pleasant, smiles at appropriate times. When asked if he is feeling sad, he says, "I don't feel sassy, and smiles." Patient is not aggressive. He has periorbital bruising on right eye. Patient is difficult to understand; no teeth and no dentures. He is tangential when we are asking questions. He will give names of people and say they are coming to dinner. He says "I used to live in [place]; I am getting ready to move. He is able to respond to some questions that are not in the abstract.  He says he is happy. When I left the room he was able to respond that he wanted the light out.   Collateral obtained from group home owner, Ms. Jarvis Morgan, [phone]: Patient has been in the home since July 2023. He was falling when he first came to them, but it has gotten worse. She believes their is something medically wrong with him.  He has been in the hospital for UTI, pneumonia, urinary retention. He went to a SNF for a short time after hospitalization for pneumonia.  Chart review reveals he  had been to the ED 5 times for falls, syncope, altered mental status since he was hospitalized  for pneumonia and sent to SNF for short stay. Patient has history of surgery for cerebral aneurysm, Hep C. She is worried about his urinary retention and weight loss. Writer also spoke with patient's guardian, [name] [phone]), who expressed the same concerns. Ms. Dellis Filbert asked for patient's PSA to be checked. Writer verbally conveyed Ms. Nines' request to Dr. Joylene Igo.   Past Psychiatric History: schizoaffective disorder, depressive type; cognitive decline  Risk to Self:   Risk to Others:   Prior Inpatient Therapy:   Prior Outpatient Therapy:    Past Medical History:  Past Medical History:  Diagnosis Date   Anxiety    Atherosclerosis of aorta (HCC)    Auditory hallucination    Cerebral aneurysm, nonruptured    COPD with emphysema (HCC)    Drug abuse (HCC)    Dysphagia, oropharyngeal phase    GAD (generalized anxiety disorder)    GERD (gastroesophageal reflux disease)    Hepatitis C    Hypercholesterolemia    Hypertension    Middle cerebral artery aneurysm    Mild intellectual disabilities    Paranoia (HCC)    Pneumonia 2017   Psoriasis    Psoriasis vulgaris    Repeated falls    Schizoaffective disorder, depressive type (HCC)    Schizophrenia (HCC)    diagnosed at age 47, treated by ACT team  Past Surgical History:  Procedure Laterality Date   CHEST TUBE INSERTION     COLONOSCOPY  Nov 2011   Dr. Samuella Cota: normal, internal hemorrhoids   COLONOSCOPY  Sept 2009   small internal hemorrhoids, normal    ESOPHAGOGASTRODUODENOSCOPY  June 2010   Dr. Samuella Cota: normal   ESOPHAGOGASTRODUODENOSCOPY  Jan 2009   Dr. Allena Katz: linear esophagitis, mild stricture, diffuse gastritis and mild duodenitis, PATH: chronic gastritis, negative H.pylori    FOOT SURGERY     right   Family History:  Family History  Problem Relation Age of Onset   COPD Father    Colon cancer Neg Hx    Family  Psychiatric  History:  Social History:  Social History   Substance and Sexual Activity  Alcohol Use No     Social History   Substance and Sexual Activity  Drug Use Not Currently   Comment: IV drug use in remote past    Social History   Socioeconomic History   Marital status: Single    Spouse name: Not on file   Number of children: Not on file   Years of education: Not on file   Highest education level: Not on file  Occupational History   Not on file  Tobacco Use   Smoking status: Former    Packs/day: 1.00    Types: Cigarettes   Smokeless tobacco: Never  Vaping Use   Vaping Use: Never used  Substance and Sexual Activity   Alcohol use: No   Drug use: Not Currently    Comment: IV drug use in remote past   Sexual activity: Not on file  Other Topics Concern   Not on file  Social History Narrative   Not on file   Social Determinants of Health   Financial Resource Strain: Not on file  Food Insecurity: Not on file  Transportation Needs: Not on file  Physical Activity: Not on file  Stress: Not on file  Social Connections: Not on file   Additional Social History:    Allergies:   Allergies  Allergen Reactions   Propoxyphene Rash    Labs:  Results for orders placed or performed during the hospital encounter of 10/24/22 (from the past 48 hour(s))  CBC with Differential     Status: Abnormal   Collection Time: 10/23/22 11:59 PM  Result Value Ref Range   WBC 11.7 (H) 4.0 - 10.5 K/uL   RBC 3.29 (L) 4.22 - 5.81 MIL/uL   Hemoglobin 10.1 (L) 13.0 - 17.0 g/dL   HCT 19.1 (L) 66.0 - 60.0 %   MCV 95.7 80.0 - 100.0 fL   MCH 30.7 26.0 - 34.0 pg   MCHC 32.1 30.0 - 36.0 g/dL   RDW 45.9 (H) 97.7 - 41.4 %   Platelets 232 150 - 400 K/uL   nRBC 0.0 0.0 - 0.2 %   Neutrophils Relative % 67 %   Neutro Abs 7.8 (H) 1.7 - 7.7 K/uL   Lymphocytes Relative 26 %   Lymphs Abs 3.0 0.7 - 4.0 K/uL   Monocytes Relative 7 %   Monocytes Absolute 0.8 0.1 - 1.0 K/uL   Eosinophils Relative  0 %   Eosinophils Absolute 0.0 0.0 - 0.5 K/uL   Basophils Relative 0 %   Basophils Absolute 0.0 0.0 - 0.1 K/uL   Immature Granulocytes 0 %   Abs Immature Granulocytes 0.05 0.00 - 0.07 K/uL    Comment: Performed at [facility], [address], [place], [place] [address]  Comprehensive metabolic  panel     Status: Abnormal   Collection Time: 10/23/22 11:59 PM  Result Value Ref Range   Sodium 140 135 - 145 mmol/L   Potassium 3.9 3.5 - 5.1 mmol/L   Chloride 103 98 - 111 mmol/L   CO2 30 22 - 32 mmol/L   Glucose, Bld 122 (H) 70 - 99 mg/dL    Comment: Glucose reference range applies only to samples taken after fasting for at least 8 hours.   BUN 15 8 - 23 mg/dL   Creatinine, Ser 1.61 0.61 - 1.24 mg/dL   Calcium 8.4 (L) 8.9 - 10.3 mg/dL   Total Protein 6.1 (L) 6.5 - 8.1 g/dL   Albumin 3.1 (L) 3.5 - 5.0 g/dL   AST 16 15 - 41 U/L   ALT 7 0 - 44 U/L   Alkaline Phosphatase 79 38 - 126 U/L   Total Bilirubin 1.0 0.3 - 1.2 mg/dL   GFR, Estimated >09 >60 mL/min    Comment: (NOTE) Calculated using the CKD-EPI Creatinine Equation (2021)    Anion gap 7 5 - 15    Comment: Performed at [facility], [address], [place], [place] [address]  Troponin I (High Sensitivity)     Status: None   Collection Time: 10/23/22 11:59 PM  Result Value Ref Range   Troponin I (High Sensitivity) 5 <18 ng/L    Comment: (NOTE) Elevated high sensitivity troponin I (hsTnI) values and significant  changes across serial measurements may suggest ACS but many other  chronic and acute conditions are known to elevate hsTnI results.  Refer to the "Links" section for chest pain algorithms and additional  guidance. Performed at [facility], [address] [place], [place] [address]   Urinalysis, Routine w reflex microscopic     Status: Abnormal   Collection Time: 10/24/22 12:06 AM  Result Value Ref Range   Color, Urine RED (A) YELLOW   APPearance CLOUDY (A) CLEAR   Specific Gravity,  Urine 1.021 1.005 - 1.030   pH  5.0 - 8.0    TEST NOT REPORTED DUE TO COLOR INTERFERENCE OF URINE PIGMENT   Glucose, UA (A) NEGATIVE mg/dL    TEST NOT REPORTED DUE TO COLOR INTERFERENCE OF URINE PIGMENT   Hgb urine dipstick (A) NEGATIVE    TEST NOT REPORTED DUE TO COLOR INTERFERENCE OF URINE PIGMENT   Bilirubin Urine (A) NEGATIVE    TEST NOT REPORTED DUE TO COLOR INTERFERENCE OF URINE PIGMENT   Ketones, ur (A) NEGATIVE mg/dL    TEST NOT REPORTED DUE TO COLOR INTERFERENCE OF URINE PIGMENT   Protein, ur (A) NEGATIVE mg/dL    TEST NOT REPORTED DUE TO COLOR INTERFERENCE OF URINE PIGMENT   Nitrite (A) NEGATIVE    TEST NOT REPORTED DUE TO COLOR INTERFERENCE OF URINE PIGMENT   Leukocytes,Ua (A) NEGATIVE    TEST NOT REPORTED DUE TO COLOR INTERFERENCE OF URINE PIGMENT   RBC / HPF >50 (H) 0 - 5 RBC/hpf   WBC, UA NONE SEEN 0 - 5 WBC/hpf   Bacteria, UA RARE (A) NONE SEEN   Squamous Epithelial / LPF NONE SEEN 0 - 5    Comment: Performed at [facility], [address], [place], [place] [address]  Urine Drug Screen, Qualitative ([facility] only)     Status: Abnormal   Collection Time: 10/24/22 12:06 AM  Result Value Ref Range   Tricyclic, Ur Screen POSITIVE (A) NONE DETECTED   Amphetamines, Ur Screen NONE DETECTED NONE DETECTED  MDMA (Ecstasy)Ur Screen NONE DETECTED NONE DETECTED   Cocaine Metabolite,Ur [place] NONE DETECTED NONE DETECTED   Opiate, Ur Screen NONE DETECTED NONE DETECTED   Phencyclidine (PCP) Ur S NONE DETECTED NONE DETECTED   Cannabinoid 50 Ng, Ur [place] NONE DETECTED NONE DETECTED   Barbiturates, Ur Screen NONE DETECTED NONE DETECTED   Benzodiazepine, Ur Scrn NONE DETECTED NONE DETECTED   Methadone Scn, Ur NONE DETECTED NONE DETECTED    Comment: (NOTE) Tricyclics + metabolites, urine    Cutoff 1000 ng/mL Amphetamines + metabolites, urine  Cutoff 1000 ng/mL MDMA (Ecstasy), urine              Cutoff 500 ng/mL Cocaine Metabolite, urine          Cutoff 300 ng/mL Opiate +  metabolites, urine        Cutoff 300 ng/mL Phencyclidine (PCP), urine         Cutoff 25 ng/mL Cannabinoid, urine                 Cutoff 50 ng/mL Barbiturates + metabolites, urine  Cutoff 200 ng/mL Benzodiazepine, urine              Cutoff 200 ng/mL Methadone, urine                   Cutoff 300 ng/mL  The urine drug screen provides only a preliminary, unconfirmed analytical test result and should not be used for non-medical purposes. Clinical consideration and professional judgment should be applied to any positive drug screen result due to possible interfering substances. A more specific alternate chemical method must be used in order to obtain a confirmed analytical result. Gas chromatography / mass spectrometry (GC/MS) is the preferred confirm atory method. Performed at [facility], [address] [place], [place] [address]   Troponin I (High Sensitivity)     Status: None   Collection Time: 10/24/22  2:57 AM  Result Value Ref Range   Troponin I (High Sensitivity) 5 <18 ng/L    Comment: (NOTE) Elevated high sensitivity troponin I (hsTnI) values and significant  changes across serial measurements may suggest ACS but many other  chronic and acute conditions are known to elevate hsTnI results.  Refer to the "Links" section for chest pain algorithms and additional  guidance. Performed at [facility], [address] [place], [place] [address]   SARS Coronavirus 2 by RT PCR (hospital order, performed in [facility] hospital lab) *cepheid single result test* Anterior Nasal Swab     Status: None   Collection Time: 10/24/22  4:29 AM   Specimen: Anterior Nasal Swab  Result Value Ref Range   SARS Coronavirus 2 by RT PCR NEGATIVE NEGATIVE    Comment: (NOTE) SARS-CoV-2 target nucleic acids are NOT DETECTED.  The SARS-CoV-2 RNA is generally detectable in upper and lower respiratory specimens during the acute phase of infection. The lowest concentration of  SARS-CoV-2 viral copies this assay can detect is 250 copies / mL. A negative result does not preclude SARS-CoV-2 infection and should not be used as the sole basis for treatment or other patient management decisions.  A negative result may occur with improper specimen collection / handling, submission of specimen other than nasopharyngeal swab, presence of viral mutation(s) within the areas targeted by this assay, and inadequate number of viral copies (<250 copies / mL). A negative result must be combined with clinical observations, patient history, and epidemiological information.  Fact Sheet for Patients:   RoadLapTop.co.za  Fact  Sheet for Healthcare Providers: http://kim-miller.com/https://www.fda.gov/media/158404/download  This test is not yet approved or  cleared by the [place] FDA and has been authorized for detection and/or diagnosis of SARS-CoV-2 by FDA under an Emergency Use Authorization (EUA).  This EUA will remain in effect (meaning this test can be used) for the duration of the COVID-19 declaration under Section 564(b)(1) of the Act, 21 U.S.C. section 360bbb-3(b)(1), unless the authorization is terminated or revoked sooner.  Performed at [facility], [address] [place], [place] [address]   Ethanol     Status: None   Collection Time: 10/24/22  7:58 AM  Result Value Ref Range   Alcohol, Ethyl (B) <10 <10 mg/dL    Comment: (NOTE) Lowest detectable limit for serum alcohol is 10 mg/dL.  For medical purposes only. Performed at [facility], [address] [place], [place] [address]   Protime-INR     Status: None   Collection Time: 10/24/22  7:58 AM  Result Value Ref Range   Prothrombin Time 14.6 11.4 - 15.2 seconds   INR 1.2 0.8 - 1.2    Comment: (NOTE) INR goal varies based on device and disease states. Performed at [facility], [address] [place], [place] [address]   Vitamin B12     Status: None   Collection  Time: 10/24/22  7:58 AM  Result Value Ref Range   Vitamin B-12 370 180 - 914 pg/mL    Comment: (NOTE) This assay is not validated for testing neonatal or myeloproliferative syndrome specimens for Vitamin B12 levels. Performed at [facility], [address] [place], [place] [address]   RPR     Status: None   Collection Time: 10/24/22  7:58 AM  Result Value Ref Range   RPR Ser Ql NON REACTIVE NON REACTIVE    Comment: Performed at [facility], [address] [place], [place] [address]    Current Facility-Administered Medications  Medication Dose Route Frequency Provider Last Rate Last Admin   acetaminophen (TYLENOL) tablet 650 mg  650 mg Oral Q6H PRN Agbata, Tochukwu, MD       Or   acetaminophen (TYLENOL) suppository 650 mg  650 mg Rectal Q6H PRN Agbata, Tochukwu, MD       benztropine (COGENTIN) tablet 0.5 mg  0.5 mg Oral TID Vanetta MuldersBarthold, Chantee Cerino F, NP       [START ON 10/25/2022] chlorproMAZINE (THORAZINE) tablet 25 mg  25 mg Oral Daily Gabriel CirriBarthold, Miliyah Luper F, NP       FLUoxetine (PROZAC) capsule 20 mg  20 mg Oral Daily Agbata, Tochukwu, MD   20 mg at 10/24/22 1553   [START ON 10/25/2022] fluticasone furoate-vilanterol (BREO ELLIPTA) 100-25 MCG/ACT 1 puff  1 puff Inhalation Daily Agbata, Tochukwu, MD       And   [START ON 10/25/2022] umeclidinium bromide (INCRUSE ELLIPTA) 62.5 MCG/ACT 1 puff  1 puff Inhalation Daily Agbata, Tochukwu, MD       gabapentin (NEURONTIN) capsule 100 mg  100 mg Oral TID Agbata, Tochukwu, MD   100 mg at 10/24/22 1554   haloperidol lactate (HALDOL) injection 1 mg  1 mg Intravenous Q6H PRN Agbata, Tochukwu, MD   1 mg at 10/24/22 1522   levETIRAcetam (KEPPRA) tablet 500 mg  500 mg Oral BID Agbata, Tochukwu, MD   500 mg at 10/24/22 1553   montelukast (SINGULAIR) tablet 10 mg  10 mg Oral QHS Agbata, Tochukwu, MD       OLANZapine (ZYPREXA) tablet 20 mg  20 mg Oral QHS Agbata, Tochukwu, MD       ondansetron (ZOFRAN) tablet 4 mg  4 mg Oral Q6H PRN Agbata,  Tochukwu, MD       Or   ondansetron (ZOFRAN) injection 4 mg  4 mg Intravenous Q6H PRN Agbata, Tochukwu, MD       pantoprazole (PROTONIX) EC tablet 40 mg  40 mg Oral Daily Agbata, Tochukwu, MD   40 mg at 10/24/22 1553   pravastatin (PRAVACHOL) tablet 20 mg  20 mg Oral q1800 Agbata, Tochukwu, MD       tamsulosin (FLOMAX) capsule 0.4 mg  0.4 mg Oral QPM Agbata, Tochukwu, MD       traZODone (DESYREL) tablet 50 mg  50 mg Oral QHS Agbata, Tochukwu, MD       valbenazine (INGREZZA) capsule 40 mg  40 mg Oral Daily Gabriel Cirri F, NP   40 mg at 10/24/22 1553   Current Outpatient Medications  Medication Sig Dispense Refill   aspirin 81 MG chewable tablet Chew 81 mg by mouth daily.     benztropine (COGENTIN) 1 MG tablet Take 1 mg by mouth 3 (three) times daily.     cetirizine (ZYRTEC) 10 MG tablet Take 1 tablet (10 mg total) by mouth daily. 30 tablet 0   chlorproMAZINE (THORAZINE) 50 MG tablet Take 1 tablet (50 mg total) by mouth daily. 30 tablet 0   FLUoxetine (PROZAC) 20 MG capsule Take 1 capsule (20 mg total) by mouth daily. 30 capsule 0   Fluticasone-Umeclidin-Vilant (TRELEGY ELLIPTA) 100-62.5-25 MCG/ACT AEPB Inhale 1 puff into the lungs daily.     gabapentin (NEURONTIN) 100 MG capsule Take 100 mg by mouth 3 (three) times daily.     levETIRAcetam (KEPPRA) 500 MG tablet Take 1 tablet (500 mg total) by mouth 2 (two) times daily. 60 tablet 0   pantoprazole (PROTONIX) 40 MG tablet Take 1 tablet (40 mg total) by mouth daily. 30 tablet 0   valbenazine (INGREZZA) 40 MG capsule Take 40 mg by mouth daily.     albuterol (VENTOLIN HFA) 108 (90 Base) MCG/ACT inhaler Inhale 2 puffs into the lungs every 6 (six) hours as needed for wheezing or shortness of breath. 8 g 5   budesonide (PULMICORT) 0.25 MG/2ML nebulizer solution Take 2 mLs (0.25 mg total) by nebulization 2 (two) times daily. 60 mL 12   lovastatin (MEVACOR) 20 MG tablet Take 1 tablet (20 mg total) by mouth daily. (Patient taking differently: Take 20  mg by mouth every evening.) 30 tablet 0   montelukast (SINGULAIR) 10 MG tablet Take 1 tablet (10 mg total) by mouth at bedtime. (Patient taking differently: Take 10 mg by mouth every evening.) 30 tablet 0   OLANZapine (ZYPREXA) 20 MG tablet Take 1 tablet (20 mg total) by mouth at bedtime. 30 tablet 0   traZODone (DESYREL) 50 MG tablet Take 1 tablet (50 mg total) by mouth at bedtime. 30 tablet 0    Musculoskeletal: Strength & Muscle Tone:  upper body appear wnl Gait & Station:  did not assess Patient leans: N/A   Psychiatric Specialty Exam:  Presentation  General Appearance: Disheveled  Eye Contact:Good  Speech:Garbled (no teeth)  Speech Volume:Normal  Handedness:Right   Mood and Affect  Mood:Euthymic  Affect:Congruent   Thought Process  Thought Processes:Disorganized  Descriptions of Associations:Loose  Orientation:Partial  Thought Content:Scattered  History of Schizophrenia/Schizoaffective disorder:Yes  Duration of Psychotic Symptoms:No data recorded Hallucinations:Hallucinations: None  Ideas of Reference:None  Suicidal Thoughts:Suicidal Thoughts: -- (unable to fully  assess)  Homicidal Thoughts:Homicidal Thoughts: -- (unable to assess)   Sensorium  Memory:Immediate Poor  Judgment:Impaired  Insight:Poor   Executive Functions  Concentration:Fair  Attention Span:Fair  Recall:Poor  Fund of Knowledge:Poor  Language:Fair   Psychomotor Activity  Psychomotor Activity:Psychomotor Activity: Normal   Assets  Assets:Housing; Social Support; Resilience   Sleep  Sleep:Sleep: Fair   Physical Exam: Physical Exam Vitals and nursing note reviewed.  HENT:     Head: Normocephalic.     Nose: No congestion or rhinorrhea.  Eyes:     General:        Right eye: No discharge (bruised around the eye).        Left eye: No discharge.  Cardiovascular:     Rate and Rhythm: Normal rate.  Pulmonary:     Effort: Pulmonary effort is normal.   Musculoskeletal:        General: Normal range of motion.  Skin:    General: Skin is dry.  Neurological:     Comments: Oriented to self  Psychiatric:        Attention and Perception: Attention normal.        Mood and Affect: Mood normal.        Speech: Speech normal.        Behavior: Behavior is cooperative.        Cognition and Memory: Memory is impaired. He exhibits impaired recent memory.        Judgment: Judgment normal.    Review of Systems  Constitutional:  Positive for weight loss.  HENT:         No complaints   Eyes: Negative.   Respiratory: Negative.    Genitourinary:        Has indwelling catheter   Musculoskeletal:  Positive for falls.  Skin:        Bruising right periorbital   Psychiatric/Behavioral:         Schizoaffective disorder    Blood pressure 110/67, pulse 62, temperature 98 F (36.7 C), temperature source Oral, resp. rate 18, height  (1.702 m), weight 68.6 kg, SpO2 98 %. Body mass index is 23.69 kg/m.  Treatment Plan Summary: Plan : Patient has presented to the ED several times with frequent falls and confusion. He is on the medical service, being treated for acute metabolic encephalopathy. In the event that some of his psychiatric medications may be contributing to his confusion and frequent falls, we will: Decrease cogentin 1 mg daily to  0.5 mg 3 times daily from 1 mg 3 times daily; Decrease Thorazine from 50 mg daily to 25 mg daily. Psychiatry will be available as needed.   Reviewed with Dr. Lucky Rathke  Disposition: No evidence of imminent risk to self or others at present.   Patient does not meet criteria for psychiatric inpatient admission.  Vanetta Mulders, NP 10/24/2022 5:25 PM

## 2022-10-24 NOTE — Assessment & Plan Note (Addendum)
Patient is on multiple antipsychotic agents which include Cogentin, Thorazine, Prozac, Zyprexa, trazodone and Ingrezza 11/29 -- persistent agitation despite Haldol.  IM Geodon ordered and psych was updated. --Psych consulted on admission --Meds continued --PRN IM Haldol for agitation --Thorazine PRN at beginning of agitation

## 2022-10-24 NOTE — Assessment & Plan Note (Signed)
Continue PPI ?

## 2022-10-24 NOTE — H&P (Signed)
History and Physical    Patient: Ian Medina PTW:[id number] DOB: 02-Aug-1956 DOA: 10/24/2022 DOS: the patient was seen and examined on 10/24/2022 PCP: Lorelee Market, MD  Patient coming from:    Group home  Chief Complaint:  Chief Complaint  Patient presents with   Altered Mental Status    Most of the history was obtained from his caregiver Ian Medina and from the EMR. HPI: Ian Medina is a 66 y.o. male with medical history significant for schizoaffective disorder, mild intellectual disability, nicotine dependence, COPD, generalized anxiety disorder , hypertension, hepatitis C who was brought into the ER by EMS for evaluation of frequent falls, worsening cognitive decline and decrease in his ability to carry out his ADL's.  Patient has been seen twice in the emergency room for falls.  Had a CT T scan of the head and C-spine/face which did not show any acute pathology 2 days prior to this admission.  He was noted to have urinary retention during that visit and had a Foley catheter placed. During my evaluation he is unable to provide any history and appears very confused. He has ecchymoses involving his right eye.  Called and spoke to patient's legal guardian Ian Medina in detail and she is concerned about patient's progressive decline which she describes as worsening confusion, frequent falls and inability to carry out his activities of daily living. Patient was recently hospitalized from 09/24/22  through 09/28/22 for a similar presentation and at that time was found to have pneumonia and multiple rib fractures.  Per documentation his mental status improved and he was discharged to a SNF. He will be admitted to the hospital for further evaluation.    Review of Systems: unable to review all systems due to the inability of the patient to answer questions. Past Medical History:  Diagnosis Date   Anxiety    Atherosclerosis of aorta (HCC)    Auditory hallucination    Cerebral aneurysm,  nonruptured    COPD with emphysema (HCC)    Drug abuse (HCC)    Dysphagia, oropharyngeal phase    GAD (generalized anxiety disorder)    GERD (gastroesophageal reflux disease)    Hepatitis C    Hypercholesterolemia    Hypertension    Middle cerebral artery aneurysm    Mild intellectual disabilities    Paranoia (HCC)    Pneumonia 2017   Psoriasis    Psoriasis vulgaris    Repeated falls    Schizoaffective disorder, depressive type (HCC)    Schizophrenia (HCC)    diagnosed at age 6, treated by ACT team   Past Surgical History:  Procedure Laterality Date   CHEST TUBE INSERTION     COLONOSCOPY  Nov 2011   Dr. Samuella Cota: normal, internal hemorrhoids   COLONOSCOPY  Sept 2009   small internal hemorrhoids, normal    ESOPHAGOGASTRODUODENOSCOPY  June 2010   Dr. Samuella Cota: normal   ESOPHAGOGASTRODUODENOSCOPY  Jan 2009   Dr. Allena Katz: linear esophagitis, mild stricture, diffuse gastritis and mild duodenitis, PATH: chronic gastritis, negative H.pylori    FOOT SURGERY     right   Social History:  reports that he has quit smoking. His smoking use included cigarettes. He smoked an average of 1 pack per day. He has never used smokeless tobacco. He reports that he does not currently use drugs. He reports that he does not drink alcohol.  Allergies  Allergen Reactions   Propoxyphene Rash    Family History  Problem Relation Age of Onset   COPD Father  Colon cancer Neg Hx     Prior to Admission medications   Medication Sig Start Date End Date Taking? Authorizing Provider  albuterol (VENTOLIN HFA) 108 (90 Base) MCG/ACT inhaler Inhale 2 puffs into the lungs every 6 (six) hours as needed for wheezing or shortness of breath. 06/07/22   Gilles Chiquito, MD  aspirin 81 MG chewable tablet Chew 81 mg by mouth daily.    [provider]  benztropine (COGENTIN) 1 MG tablet Take 1 mg by mouth 3 (three) times daily.    [provider]  budesonide (PULMICORT) 0.25 MG/2ML nebulizer solution  Take 2 mLs (0.25 mg total) by nebulization 2 (two) times daily. 06/07/22   Gilles Chiquito, MD  cetirizine (ZYRTEC) 10 MG tablet Take 1 tablet (10 mg total) by mouth daily. 06/07/22   Gilles Chiquito, MD  chlorproMAZINE (THORAZINE) 50 MG tablet Take 1 tablet (50 mg total) by mouth daily. 06/07/22   Gilles Chiquito, MD  FLUoxetine (PROZAC) 20 MG capsule Take 1 capsule (20 mg total) by mouth daily. 06/07/22   Gilles Chiquito, MD  Fluticasone-Umeclidin-Vilant (TRELEGY ELLIPTA) 100-62.5-25 MCG/ACT AEPB Inhale 1 puff into the lungs daily.    [provider]  gabapentin (NEURONTIN) 100 MG capsule Take 100 mg by mouth 3 (three) times daily. 01/25/22   [provider]  levETIRAcetam (KEPPRA) 500 MG tablet Take 1 tablet (500 mg total) by mouth 2 (two) times daily. 10/01/22   Bethann Berkshire, MD  lovastatin (MEVACOR) 20 MG tablet Take 1 tablet (20 mg total) by mouth daily. Patient taking differently: Take 20 mg by mouth every evening. 06/07/22   Gilles Chiquito, MD  montelukast (SINGULAIR) 10 MG tablet Take 1 tablet (10 mg total) by mouth at bedtime. Patient taking differently: Take 10 mg by mouth every evening. 06/07/22   Gilles Chiquito, MD  OLANZapine (ZYPREXA) 20 MG tablet Take 1 tablet (20 mg total) by mouth at bedtime. 06/07/22   Gilles Chiquito, MD  pantoprazole (PROTONIX) 40 MG tablet Take 1 tablet (40 mg total) by mouth daily. 06/07/22   Gilles Chiquito, MD  traZODone (DESYREL) 50 MG tablet Take 1 tablet (50 mg total) by mouth at bedtime. 06/07/22   Gilles Chiquito, MD  valbenazine [facility]) 40 MG capsule Take 40 mg by mouth daily.    [provider]    Physical Exam: Vitals:   10/23/22 1955 10/24/22 0320  BP: 114/88 109/89  Pulse: 97 88  Resp: 20 20  Temp: 98.2 F (36.8 C) 97.8 F (36.6 C)  TempSrc: Oral Oral  SpO2: 99% 98%  Weight: 68.6 kg   Height: 5\' 7"  (1.702 m)    Physical Exam Vitals and nursing note reviewed.  Constitutional:      Comments: Oriented  only to person  HENT:     Head: Normocephalic and atraumatic.     Comments: Ecchymosis over his right eye    Nose: Nose normal.     Mouth/Throat:     Mouth: Mucous membranes are moist.  Eyes:     Conjunctiva/sclera: Conjunctivae normal.  Cardiovascular:     Rate and Rhythm: Normal rate and regular rhythm.  Pulmonary:     Effort: Pulmonary effort is normal.     Breath sounds: Normal breath sounds.  Abdominal:     General: Abdomen is flat. Bowel sounds are normal.     Palpations: Abdomen is soft.  Genitourinary:    Comments: Foley catheter in place.  Hematuria most likely  traumatic Musculoskeletal:     Cervical back: Normal range of motion and neck supple.  Skin:    Comments: Skin tear on right forearm  Neurological:     Mental Status: He is alert.     Comments: Able to move all extremities.  Oriented to person  Psychiatric:     Comments: Pleasantly confused     Data Reviewed: Relevant notes from primary care and specialist visits, past discharge summaries as available in EHR, including Care Everywhere. Prior diagnostic testing as pertinent to current admission diagnoses Updated medications and problem lists for reconciliation ED course, including vitals, labs, imaging, treatment and response to treatment Triage notes, nursing and pharmacy notes and ED provider's notes Notable results as noted in HPI Labs reviewed.  Urine drug screen is positive for tricyclic's, alcohol level less than 10, sodium 140, potassium 3.9, chloride 103, bicarb 30, glucose 122, BUN 15, creatinine 0.76, calcium 8.4, total protein 6.1, albumin 3.1, AST 16, ALT 7, alkaline phosphatase 79, total bilirubin 1.0, white count 11.7, hemoglobin 10.1, hematocrit 31.5, platelet count 232, CT scan of the head without contrast shows  Partially covered segmental fracture of the right zygoma, as reported yesterday. Chest x-ray reviewed by me shows Incomplete resolution of airspace disease in the right lung, recommend  continued follow-up to clearing. There are no new results to review at this time.  Assessment and Plan: * Acute metabolic encephalopathy Unclear etiology Patient presents for evaluation of worsening mental status changes and appears to be very confused and oriented only to person which is legal guardian states is unusual for him. Concern for possible dementia in this patient who has CT scan findings suggestive of encephalomalacia in the left temporal lobe We will obtain ammonia levels We will request psych consult to review his meds  Urinary retention due to benign prostatic hyperplasia Noted to have urinary retention during his last ER visit and had a Foley catheter placed Patient now has hematuria most likely traumatic from pulling on the Foley We will start patient on Flomax 0.4 mg daily   GERD (gastroesophageal reflux disease) Continue PPI  Cognitive decline Treatment as outlined in 1  Falls Patient presents for evaluation of multiple falls and was seen in the ER 2 days prior to this admission  He had a CT scan of the head without contrast which showed  partially covered segmental fracture of the right zygoma, as Place patient on fall precautions PT evaluation once mental status improves  Schizoaffective disorder, depressive type (HCC) Patient is on multiple antipsychotic agents which include Cogentin, Thorazine, Prozac, Zyprexa, trazodone and Ingrezza We will request psychiatry consult for medication management      Advance Care Planning:   Code Status: Full Code.  Consults: Behavioral health, physical therapy, TOC  Family Communication: Greater than 50% of time was spent discussing patient's condition and plan of care with his legal guardian Ian SonCarrie over the phone.  All questions and concerns have been addressed.  CODE STATUS was discussed and he is a full code.  Severity of Illness: The appropriate patient status for this patient is INPATIENT. Inpatient status is  judged to be reasonable and necessary in order to provide the required intensity of service to ensure the patient's safety. The patient's presenting symptoms, physical exam findings, and initial radiographic and laboratory data in the context of their chronic comorbidities is felt to place them at high risk for further clinical deterioration. Furthermore, it is not anticipated that the patient will be medically stable for discharge  from the hospital within 2 midnights of admission.   * I certify that at the point of admission it is my clinical judgment that the patient will require inpatient hospital care spanning beyond 2 midnights from the point of admission due to high intensity of service, high risk for further deterioration and high frequency of surveillance required.*  Author: Lucile Shutters, MD 10/24/2022 12:14 PM  For on call review [email or web address].

## 2022-10-25 DIAGNOSIS — F251 Schizoaffective disorder, depressive type: Secondary | ICD-10-CM

## 2022-10-25 DIAGNOSIS — W19XXXD Unspecified fall, subsequent encounter: Secondary | ICD-10-CM | POA: Diagnosis not present

## 2022-10-25 DIAGNOSIS — N401 Enlarged prostate with lower urinary tract symptoms: Secondary | ICD-10-CM | POA: Diagnosis not present

## 2022-10-25 DIAGNOSIS — G9341 Metabolic encephalopathy: Secondary | ICD-10-CM | POA: Diagnosis not present

## 2022-10-25 DIAGNOSIS — R338 Other retention of urine: Secondary | ICD-10-CM

## 2022-10-25 LAB — CBC
HCT: 30.6 % — ABNORMAL LOW (ref 39.0–52.0)
Hemoglobin: 9.9 g/dL — ABNORMAL LOW (ref 13.0–17.0)
MCH: 31.5 pg (ref 26.0–34.0)
MCHC: 32.4 g/dL (ref 30.0–36.0)
MCV: 97.5 fL (ref 80.0–100.0)
Platelets: 235 10*3/uL (ref 150–400)
RBC: 3.14 MIL/uL — ABNORMAL LOW (ref 4.22–5.81)
RDW: 16.6 % — ABNORMAL HIGH (ref 11.5–15.5)
WBC: 11 10*3/uL — ABNORMAL HIGH (ref 4.0–10.5)
nRBC: 0 % (ref 0.0–0.2)

## 2022-10-25 LAB — BASIC METABOLIC PANEL
Anion gap: 8 (ref 5–15)
BUN: 10 mg/dL (ref 8–23)
CO2: 26 mmol/L (ref 22–32)
Calcium: 8.4 mg/dL — ABNORMAL LOW (ref 8.9–10.3)
Chloride: 105 mmol/L (ref 98–111)
Creatinine, Ser: 0.58 mg/dL — ABNORMAL LOW (ref 0.61–1.24)
GFR, Estimated: >60 mL/min (ref >60)
Glucose, Bld: 107 mg/dL — ABNORMAL HIGH (ref 70–99)
Potassium: 3.5 mmol/L (ref 3.5–5.1)
Sodium: 139 mmol/L (ref 135–145)

## 2022-10-25 LAB — PSA: Prostatic Specific Antigen: 0.61 ng/mL (ref 0.00–4.00)

## 2022-10-25 MED ORDER — CHLORPROMAZINE HCL 25 MG PO TABS: 25.0000 mg | ORAL_TABLET | Freq: Once | ORAL | Status: DC | PRN

## 2022-10-25 MED ORDER — ENSURE ENLIVE PO LIQD
237.0000 mL | Freq: Three times a day (TID) | ORAL | Status: DC
  Administered 2022-10-27 – 2022-11-08 (×27): 237 mL via ORAL

## 2022-10-25 MED ORDER — ZIPRASIDONE MESYLATE 20 MG IM SOLR
10.0000 mg | Freq: Once | INTRAMUSCULAR | Status: AC
  Administered 2022-10-25: 10 mg via INTRAMUSCULAR
  Filled 2022-10-25: qty 20

## 2022-10-25 MED ORDER — HALOPERIDOL LACTATE 5 MG/ML IJ SOLN
1.0000 mg | Freq: Four times a day (QID) | INTRAMUSCULAR | Status: DC | PRN
  Administered 2022-10-27: 1 mg via INTRAMUSCULAR
  Filled 2022-10-25: qty 1

## 2022-10-25 MED ORDER — ADULT MULTIVITAMIN W/MINERALS CH
1.0000 | ORAL_TABLET | Freq: Every day | ORAL | Status: DC
  Administered 2022-10-25 – 2022-11-09 (×16): 1 via ORAL
  Filled 2022-10-25 (×16): qty 1

## 2022-10-25 NOTE — Evaluation (Signed)
Occupational Therapy Evaluation Patient Details Name: Ian Medina MRN: [id number] DOB: [birth year] Today's Date: 10/25/2022   History of Present Illness Ian Medina is a 66 y.o. male with medical history significant for schizoaffective disorder, mild intellectual disability, nicotine dependence, COPD, generalized anxiety disorder , hypertension, hepatitis C who was brought into the ER by EMS for evaluation of frequent falls, worsening cognitive decline and decrease in his ability to carry out his ADL's.  Patient has been seen twice in the emergency room for falls.  Had a CT T scan of the head and C-spine/face which did not show any acute pathology 2 days prior to this admission.  He was noted to have urinary retention during that visit and had a Foley catheter placed. During my evaluation he is unable to provide any history and appears very confused.  He has ecchymoses involving his right eye.   Clinical Impression   Patient presenting with decreased Ind in self care, balance, functional mobility/transfers, endurance, and safety awareness. Patient is oriented to self only. He is unable to answer any questions related to PLOF. OT calling caregiver, [name], who reports he was supervision overall at family care home for ambulation and self care tasks. Pt recently went to SNF for rehab and returned to care home wheelchair level to decrease fall risk. She does report he was able to follow commands before admission but that cognition has decreased in last 6 weeks. Patient performs bed mobility with supervision but having difficulty following commands during session and appears distracted with tangential speech throughout. Hospital gown with breakfast on it and supervision to change soiled linen. Patient will benefit from acute OT to increase overall independence in the areas of ADLs, functional mobility, and safety awareness in order to safely discharge back to care home with HHOT.     Recommendations for  follow up therapy are one component of a multi-disciplinary discharge planning process, led by the attending physician.  Recommendations may be updated based on patient status, additional functional criteria and insurance authorization.   Follow Up Recommendations  Home health OT     Assistance Recommended at Discharge Frequent or constant Supervision/Assistance  Patient can return home with the following A little help with walking and/or transfers;A little help with bathing/dressing/bathroom;Help with stairs or ramp for entrance;Assist for transportation;Direct supervision/assist for financial management;Direct supervision/assist for medications management    Functional Status Assessment  Patient has had a recent decline in their functional status and demonstrates the ability to make significant improvements in function in a reasonable and predictable amount of time.  Equipment Recommendations  BSC/3in1       Precautions / Restrictions Precautions Precautions: Fall      Mobility Bed Mobility Overal bed mobility: Needs Assistance Bed Mobility: Supine to Sit, Sit to Supine, Rolling Rolling: Supervision   Supine to sit: Supervision Sit to supine: Supervision        Transfers                   General transfer comment: pt pulling LEs back into bed and not able to follow commands to attempt standing          ADL either performed or assessed with clinical judgement   ADL Overall ADL's : Needs assistance/impaired     Grooming: Wash/dry hands;Wash/dry face;Bed level;Set up;Supervision/safety           Upper Body Dressing : Supervision/safety;Sitting Upper Body Dressing Details (indicate cue type and reason): to don hospital gown  Vision Patient Visual Report: No change from baseline              Pertinent Vitals/Pain Pain Assessment Pain Assessment: Faces Faces Pain Scale: No hurt        Extremity/Trunk Assessment  Upper Extremity Assessment Upper Extremity Assessment: Overall WFL for tasks assessed;Generalized weakness   Lower Extremity Assessment Lower Extremity Assessment: Overall WFL for tasks assessed;Generalized weakness       Communication Communication Communication: Expressive difficulties   Cognition Arousal/Alertness: Awake/alert Behavior During Therapy: Restless, Flat affect, Impulsive Overall Cognitive Status: Impaired/Different from baseline Area of Impairment: Orientation, Attention, Following commands, Safety/judgement, Awareness, Problem solving                 Orientation Level: Disoriented to, Place, Time, Situation Current Attention Level: Focused   Following Commands: Follows one step commands inconsistently, Follows one step commands with increased time Safety/Judgement: Decreased awareness of safety, Decreased awareness of deficits Awareness: Intellectual Problem Solving: Slow processing, Decreased initiation, Difficulty sequencing, Requires verbal cues, Requires tactile cues General Comments: Pt with tangental speech and appears to be internally and externally distracted. Only able to follow commands 50% of the time. OT called caregiver, [name], and she reports cognition has declined in the last  6 weeks.                Home Living Family/patient expects to be discharged to:: Group home                                        Prior Functioning/Environment Prior Level of Function : Needs assist  Cognitive Assist : ADLs (cognitive);Mobility (cognitive)           Mobility Comments: per caregiver, 6 weeks ago he ambulated without AD but has been using wheelchair and propels self for safety ADLs Comments: supervision for self care tasks        OT Problem List: Decreased strength;Decreased activity tolerance;Impaired balance (sitting and/or standing);Decreased cognition;Decreased safety awareness;Decreased knowledge of use of DME or AE       OT Treatment/Interventions: Self-care/ADL training;Therapeutic exercise;Therapeutic activities;Energy conservation;Manual therapy;Balance training;Patient/family education;Cognitive remediation/compensation    OT Goals(Current goals can be found in the care plan section) Acute Rehab OT Goals Patient Stated Goal: none stated OT Goal Formulation: Patient unable to participate in goal setting Time For Goal Achievement: 11/09/22 Potential to Achieve Goals: Fair ADL Goals Pt Will Perform Grooming: with supervision;standing Pt Will Perform Lower Body Dressing: sit to/from stand;with supervision Pt Will Transfer to Toilet: with supervision;ambulating Pt Will Perform Toileting - Clothing Manipulation and hygiene: with supervision;sit to/from stand  OT Frequency: Min 2X/week       AM-PAC OT "6 Clicks" Daily Activity     Outcome Measure Help from another person eating meals?: None Help from another person taking care of personal grooming?: None Help from another person toileting, which includes using toliet, bedpan, or urinal?: A Little Help from another person bathing (including washing, rinsing, drying)?: A Little Help from another person to put on and taking off regular upper body clothing?: A Little Help from another person to put on and taking off regular lower body clothing?: A Little 6 Click Score: 20   End of Session Nurse Communication: Mobility status  Activity Tolerance: Other (comment) (limited by cognition) Patient left: in bed;with call bell/phone within reach;with bed alarm set  OT Visit Diagnosis: Unsteadiness on feet (R26.81);Repeated falls (R29.6);Muscle weakness (  generalized) (M62.81)                Time: 2446-2863 OT Time Calculation (min): 13 min Charges:  OT General Charges $OT Visit: 1 Visit OT Evaluation $OT Eval Moderate Complexity: 1 [address] [place], OTR/L , CBIS ascom [phone]  10/25/22, 12:24 PM

## 2022-10-25 NOTE — Progress Notes (Addendum)
Progress Note   Patient: Ian Medina SFK:[id number] DOB: 1956-04-09 DOA: 10/24/2022     1 DOS: the patient was seen and examined on 10/25/2022   Brief hospital course: Salomon Ganser is a 66 y.o. male with medical history significant for schizoaffective disorder, mild intellectual disability, nicotine dependence, COPD, generalized anxiety disorder , hypertension, hepatitis C, group home resident with guardians through DSS.  He was brought into the ED on 10/24/2022 for evaluation of frequent falls, worsening cognitive decline and decrease in his ability to carry out his ADL's.  He had been seen in the ED twice recently for falls.  CT's of head, neck and face showed only a R zygomatic fracture.  He was found to have acute urinary retention at prior ED visit, a Foley catheter was placed.  Patient's legal guardian [name] reports ongoing concern about patient's progressive decline which she describes as worsening confusion, frequent falls and inability to carry out his activities of daily living.  She also notes significant weight loss recently.   Patient was recently hospitalized from 09/24/22  through 09/28/22 for a similar presentation and at that time was found to have pneumonia and multiple rib fractures.  Per documentation his mental status improved and he was discharged to a SNF.  Pt admitted to the hospital for further evaluation and management.   Assessment and Plan: * Acute metabolic encephalopathy Unclear etiology, evaluation ongoing. Presented with progressively worsening mental status changes - very confused and oriented only to person (per guardian states is unusual for him).  Concern for possible dementia in this patient who has CT scan findings suggestive of encephalomalacia in the left temporal lobe. Ammonia, B12, RPR level normal. UDS positive only for tricyclics. Covid negative. UA bloody, rare bacteria, unable to check nitrites or leukocytes. CXR with incomplete resolution of  airspace disease in the right lung (recently had PNA, currently without respiratory symptoms). --Urine culture ordered --Psych consulted to review medication regimen --Delirium precautions  Urinary retention due to benign prostatic hyperplasia Noted to have urinary retention during his last ER visit and had a Foley catheter placed. Presented this time with gross hematuria, suspect urethral trauma from insertion vs pt pulling on the Foley. --Started on Flomax --Check PSA level --Will need urology follow up outpatient   GERD (gastroesophageal reflux disease) Continue PPI  Cognitive decline Treatment as outlined in 1  Falls Patient presents for evaluation of multiple falls and was seen in the ER 2 days prior to this admission.  CT's during prior ED visit revealed partially covered segmental fracture of the right zygoma.  No acute intracranial or C-spine findings. --Fall precautions --PT evaluation   Schizoaffective disorder, depressive type (HCC) Patient is on multiple antipsychotic agents which include Cogentin, Thorazine, Prozac, Zyprexa, trazodone and Ingrezza --Psych consulted on admission --Meds continued --PRN IM Haldol for agitation  Addendum 11/29 -- persistent agitation despite Haldol.  IM Geodon ordered and psych was updated.        Subjective: Patient seen awake resting in bed on rounds this morning.  He denies shortness of breath, congestion, facial pain, vision changes.  Legal guardian arrived to bedside during my encounter.  She continues to report patient's mental status is far from his baseline.  She also reports significant recent weight loss.  Physical Exam: Vitals:   10/25/22 0054 10/25/22 0511 10/25/22 0734 10/25/22 1259  BP: 99/67 104/65 91/61 (!) 85/60  Pulse: 80 82 82 87  Resp: 18 18 18 16   Temp: 98.4 F (36.9 C) 98.5 F (36.9  C) 97.6 F (36.4 C) 98 F (36.7 C)  TempSrc:   Axillary Oral  SpO2: 98% 98% 96% 96%  Weight:      Height:        General exam: awake, alert, no acute distress, udnerweight HEENT: Right periorbital ecchymosis, scattered small healing abrasions/scabs, moist mucus membranes, hearing grossly normal  Respiratory system: CTAB, no wheezes, rales or rhonchi, normal respiratory effort. Cardiovascular system: normal S1/S2, RRR, no pedal edema.   Gastrointestinal system: soft, NT, ND, no HSM felt, +bowel sounds. Central nervous system: A&O x self. no gross focal neurologic deficits, normal speech Extremities: moves all, no edema, normal tone Skin: dry, intact, normal temperature Psychiatry: normal mood, flat affect, abnormal judgement and insight    Data Reviewed:  Notable labs ---  glucose 107, Ca 8.4, Ca 8.4, WBC 11.0 from 11.7, Hbg 9.9 from 10.1. Normal vitamin B12 370.   Normal ammonia 25.  RPR non-reactive.  Family Communication: Legal guardian at bedside during encounter.   Disposition: Status is: Inpatient Remains inpatient appropriate because: Persistent altered mental status with evaluation ongoing.  Likely to require SNF placement.  Planned Discharge Destination: Skilled nursing facility    Time spent: 45 minutes  Author: Pennie Banter, DO 10/25/2022 2:47 PM  For on call review [email or web address].

## 2022-10-25 NOTE — Hospital Course (Addendum)
Ian Medina is a 66 y.o. male with medical history significant for schizoaffective disorder, mild intellectual disability, nicotine dependence, COPD, generalized anxiety disorder , hypertension, hepatitis C, group home resident with guardians through DSS.  He was brought into the ED on 10/24/2022 for evaluation of frequent falls, worsening cognitive decline and decrease in his ability to carry out his ADL's.  He had been seen in the ED twice recently for falls.  CT's of head, neck and face showed only a R zygomatic fracture.  He was found to have acute urinary retention at prior ED visit, a Foley catheter was placed.  Patient's legal guardian [name] reports ongoing concern about patient's progressive decline which she describes as worsening confusion, frequent falls and inability to carry out his activities of daily living.  She also notes significant weight loss recently.   SNF can take him tomorrow 11/09/22 if BP remains appropriate     Consultants:  Psychiatry  Palliative Care   Procedures: none      ASSESSMENT & PLAN:   Principal Problem:   Acute metabolic encephalopathy Active Problems:   Urinary retention due to benign prostatic hyperplasia   Hypotension   Dysphagia   Aspiration pneumonia (HCC)   Falls   Cognitive decline   GERD (gastroesophageal reflux disease)   Protein-calorie malnutrition, severe   Unintentional weight loss   Schizoaffective disorder, bipolar type (HCC)   UTI (urinary tract infection)   Acute metabolic encephalopathy Most likely related to infection d/t progression of pneumonia likely aspiration, also with UTI.   Concern for possible dementia in this patient who has CT scan findings suggestive of encephalomalacia in the left temporal lobe. Ammonia, B12, RPR level normal. UDS positive only for tricyclics. Covid negative. UA bloody, rare bacteria, unable to check nitrites or leukocytes.   Urine culture growing Staph aureus and Enterococcus  faecalis CXR with incomplete resolution of airspace disease in the right lung (recently had PNA, currently without respiratory symptoms). Antibiotics as outlined Psych consulted to review medications  Delirium precautions   Hypotension - resolved Likely due to infections/sepsis. Attempted off fluids, BP's initially low Have stopped IV fluids. Maintain MAP>65 Echo unrevealing.   No evidence of orthostasis.   Midodrine started hypotension has since somewhat improved. If patient can maintain stable blood pressures without fluids, can look at discharging to skilled nursing facility soon.   Urinary retention due to benign prostatic hyperplasia Noted to have urinary retention during his last ER visit and had a Foley catheter placed. Presented this time with gross hematuria, suspect urethral trauma from insertion vs pt pulling on the Foley. PSA level was normal. 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 Urology aware. Continue Flomax Follow up with urology   Schizoaffective disorder, depressive type (HCC) Schizoaffective disorder, bipolar type (HCC) Patient is on multiple antipsychotic agents which include Cogentin, Thorazine, Prozac, Zyprexa, trazodone and Ingrezza 11/29 -- persistent agitation despite Haldol.  IM Geodon ordered and psych was updated. Psych consulted on admission PRN IM Haldol for agitation Thorazine PRN at beginning of agitation  Aspiration pneumonia (HCC)-resolved Recent admission for PNA felt due to aspiration.  Noted to have progression of chronic dysphagia by SLP.  Became septic again on 11/30 and CXR showed worsening right-sided infiltrates.  See dysphagia Initially treated w Cefepime, and then changed to Unasyn and completed antibiotic course Aspiration precautions   Dysphagia Chronic since at least  2015,  has progressed over time.  Prior admission seen by speech therapy and noted to have aspiration with both thin and thickened liquids.  Now with worsening recurrent right-sided pneumonia. SLP following MBSS deferred due to construction, can be done outpatient Tolerating pured diet with honey thickened liquids Palliative care consulted for goals of care   Severe sepsis (HCC)-resolved as of 10/28/2022 11/30 afternoon: Febrile 101.3 F, tachycardic HR 115, and soft BP's persist.  Pt still encephalopathic as well, consistent with organ dysfunction and severe sepsis d/t pneumonia and UTI. 11/30 repeat chest x-ray showed worsening right-sided pneumonia Treated w/ abx and IV fluids    UTI (urinary tract infection) Foley had been placed for urinary retention in the ED a few days prior to this admission.  Due to AMS and cognition, unclear in urinary symptoms. Urine culture with staph aureus and enterococcus both sensitive to ampicillin. on Unasyn for Uti and aspiration PNA-completed abx course Foley replaced 12/2 after failed voiding trial  Follow w/ urology outpatient    Unintentional weight loss Per chart review, pt has had 9.5% wt loss over the past 6 months, which is not considered significant for time frame, but given malnutrition is concerning.   Needs outpatient colonoscopy if due for this (no prior records available) Monitor weights See malnutrition   Protein-calorie malnutrition, severe Related to chronic illness (COPD) as evidenced by severe fat depletion, severe muscle depletion. Appreciate dietitian recommendations Started on Ensures, MVI Monitor weight periodically Encourage PO intake given intellectual disability and cognitive decline.  GERD (gastroesophageal reflux disease) Continue PPI   Cognitive decline Treatment as outlined in 1   Falls Patient presents for evaluation of multiple falls and was seen in the ER 2 days prior to this admission.  CT's during prior ED visit  revealed partially covered segmental fracture of the right zygoma.  No acute intracranial or C-spine findings. Fall precautions PT evaluation  Needs SNF/rehab, TOC following   Hypokalemia-resolved as of 10/28/2022 K 3.3 on 11/30, replaced. Monitor BMP          DVT prophylaxis: lovenox  Pertinent IV fluids/nutrition: no continuous IV fluids  Central lines / invasive devices: Foley catheter   Code Status: FULL CODE   Disposition: inpatient  TOC needs: SNF placement  Barriers to discharge / significant pending items: unable to return to group home, requires SNF placement, BP have improved

## 2022-10-25 NOTE — Progress Notes (Signed)
Initial Nutrition Assessment  DOCUMENTATION CODES:   Severe malnutrition in context of chronic illness  INTERVENTION:   -Ensure Enlive po TID, each supplement provides 350 kcal and 20 grams of protein.  -MVI with minerals daily  NUTRITION DIAGNOSIS:   Severe Malnutrition related to chronic illness (COPD) as evidenced by severe fat depletion, severe muscle depletion.  GOAL:   Patient will meet greater than or equal to 90% of their needs  MONITOR:   PO intake, Supplement acceptance  REASON FOR ASSESSMENT:   Malnutrition Screening Tool    ASSESSMENT:   Pt with medical history significant for schizoaffective disorder, mild intellectual disability, nicotine dependence, COPD, generalized anxiety disorder , hypertension, hepatitis C, group home resident with guardians through DSS.  Pt admitted with acute metabolic encephalopathy and UTI.   Reviewed I/O's: -1.2 L x 24 hours and -1.8 L since admission  UOP: 1.2 L x 24 hours   Spoke with pt at bedside, who reports feeling better today. Pt able to confirm that he lives in a group home and eats 3 meals per day PTA. Pt reports he eats well and does not follow a special diet; he denies needs for a mechanically altered diet despite having no teeth. Pt does not recall eating any breakfast today. Pt cooperative and cordial throughout interview, however, often answered and repeated questions that RD asked previously.   Reviewed wt hx; pt has experienced a 9.5% wt loss over the past 6 months, which is not significant for time frame. However, this is concerning given pt's history of malnutrition.   Medications reviewed and include keppra.   Labs reviewed.   NUTRITION - FOCUSED PHYSICAL EXAM:  Flowsheet Row Most Recent Value  Orbital Region Severe depletion  Upper Arm Region Severe depletion  Thoracic and Lumbar Region Severe depletion  Buccal Region Severe depletion  Temple Region Severe depletion  Clavicle Bone Region Severe  depletion  Clavicle and Acromion Bone Region Severe depletion  Scapular Bone Region Severe depletion  Dorsal Hand Severe depletion  Patellar Region Severe depletion  Anterior Thigh Region Severe depletion  Posterior Calf Region Severe depletion  Edema (RD Assessment) None  Hair Reviewed  Eyes Reviewed  Mouth Reviewed  Skin Reviewed  Nails Reviewed       Diet Order:   Diet Order             Diet regular Room service appropriate? Yes; Fluid consistency: Thin  Diet effective now                   EDUCATION NEEDS:   Education needs have been addressed  Skin:  Skin Assessment: Reviewed RN Assessment  Last BM:  Unknown  Height:   Ht Readings from Last 1 Encounters:  10/23/22 5\' 7"  (1.702 m)    Weight:   Wt Readings from Last 1 Encounters:  10/23/22 68.6 kg    Ideal Body Weight:  67.3 kg  BMI:  Body mass index is 23.69 kg/m.  Estimated Nutritional Needs:   Kcal:  2100-2300  Protein:  105-120 grams  Fluid:  > 2 L    10/25/22, RD, LDN, CDCES Registered Dietitian II Certified Diabetes Care and Education Specialist Please refer to [facility] for RD and/or RD on-call/weekend/after hours pager

## 2022-10-25 NOTE — Evaluation (Signed)
Physical Therapy Evaluation Patient Details Name: Ian Medina MRN: [id number] DOB: March 03, 1956 Today's Date: 10/25/2022  History of Present Illness  66 y/o male presented to ED on 10/22/22 for falls then d/c'ed and again on 10/23/22 for AMS. Foley placed for urinary retention. Admitted for acute metabolic encephalopathy. PMH: dementia, cerebral aneurysm, COPD, emphysema, HTN, schizoaffective disorder, schizophrenia  Clinical Impression  Patient admitted with the above. Per OT, [name] (caregiver) reports patient was supervision overall and recently went to SNF for rehab and returned home at w/c level to reduce fall risk. Cognitive decline in past 6 weeks. Patient currently oriented to self and tangential incoherent speech. Able to complete bed mobility with supervision but impulsively returned to bed. Following ~50% of one step commands. Assisted patient with lunch setup with patient attempting to utilize straw as a fork/spoon. Patient will benefit from skilled PT services during acute stay to address listed deficits. Recommend HHPT at discharge at group home to maximize functional independence and mobility to decrease fall risk.      Recommendations for follow up therapy are one component of a multi-disciplinary discharge planning process, led by the attending physician.  Recommendations may be updated based on patient status, additional functional criteria and insurance authorization.  Follow Up Recommendations Home health PT (at group home)      Assistance Recommended at Discharge Frequent or constant Supervision/Assistance  Patient can return home with the following  A lot of help with walking and/or transfers;A lot of help with bathing/dressing/bathroom    Equipment Recommendations None recommended by PT  Recommendations for Other Services       Functional Status Assessment Patient has had a recent decline in their functional status and demonstrates the ability to make significant  improvements in function in a reasonable and predictable amount of time.     Precautions / Restrictions Precautions Precautions: Fall Restrictions Weight Bearing Restrictions: No      Mobility  Bed Mobility Overal bed mobility: Needs Assistance Bed Mobility: Supine to Sit, Sit to Supine, Rolling Rolling: Supervision   Supine to sit: Supervision Sit to supine: Supervision   General bed mobility comments: supervision for safety. Require max multimodal cueing to complete    Transfers                   General transfer comment: quickly returns to supine after 30 seconds of sitting EOB    Ambulation/Gait                  Stairs            Wheelchair Mobility    Modified Rankin (Stroke Patients Only)       Balance                                             Pertinent Vitals/Pain Pain Assessment Pain Assessment: Faces Faces Pain Scale: No hurt Pain Intervention(s): Monitored during session    Home Living Family/patient expects to be discharged to:: Group home                        Prior Function Prior Level of Function : Needs assist  Cognitive Assist : ADLs (cognitive);Mobility (cognitive)           Mobility Comments: per caregiver, 6 weeks ago he ambulated without AD but has been using wheelchair and propels self  for safety ADLs Comments: supervision for self care tasks     Hand Dominance        Extremity/Trunk Assessment   Upper Extremity Assessment Upper Extremity Assessment: Defer to OT evaluation    Lower Extremity Assessment Lower Extremity Assessment: Overall WFL for tasks assessed       Communication   Communication: Expressive difficulties  Cognition Arousal/Alertness: Awake/alert Behavior During Therapy: Restless, Flat affect, Impulsive Overall Cognitive Status: Impaired/Different from baseline Area of Impairment: Orientation, Attention, Following commands, Safety/judgement,  Awareness, Problem solving                 Orientation Level: Disoriented to, Place, Time, Situation Current Attention Level: Focused   Following Commands: Follows one step commands inconsistently, Follows one step commands with increased time Safety/Judgement: Decreased awareness of safety, Decreased awareness of deficits Awareness: Intellectual Problem Solving: Slow processing, Decreased initiation, Difficulty sequencing, Requires verbal cues, Requires tactile cues General Comments: Tangential speech. Following ~50% of commands (one step). incoherent speech at times. Oriented to self only        General Comments      Exercises     Assessment/Plan    PT Assessment Patient needs continued PT services  PT Problem List Decreased strength;Decreased activity tolerance;Decreased balance;Decreased mobility;Decreased cognition;Decreased coordination;Decreased safety awareness       PT Treatment Interventions DME instruction;Gait training;Functional mobility training;Therapeutic activities;Therapeutic exercise;Balance training;Patient/family education    PT Goals (Current goals can be found in the Care Plan section)  Acute Rehab PT Goals Patient Stated Goal: did not state PT Goal Formulation: Patient unable to participate in goal setting Time For Goal Achievement: 11/08/22 Potential to Achieve Goals: Fair    Frequency Min 2X/week     Co-evaluation               AM-PAC PT "6 Clicks" Mobility  Outcome Measure Help needed turning from your back to your side while in a flat bed without using bedrails?: A Little Help needed moving from lying on your back to sitting on the side of a flat bed without using bedrails?: A Little Help needed moving to and from a bed to a chair (including a wheelchair)?: A Little Help needed standing up from a chair using your arms (e.g., wheelchair or bedside chair)?: A Lot Help needed to walk in hospital room?: Total Help needed climbing  3-5 steps with a railing? : Total 6 Click Score: 13    End of Session   Activity Tolerance: Patient tolerated treatment well Patient left: in bed;with call bell/phone within reach;with bed alarm set Nurse Communication: Mobility status PT Visit Diagnosis: Unsteadiness on feet (R26.81);Repeated falls (R29.6);Muscle weakness (generalized) (M62.81);History of falling (Z91.81);Difficulty in walking, not elsewhere classified (R26.2)    Time: [id number] PT Time Calculation (min) (ACUTE ONLY): 15 min   Charges:   PT Evaluation $PT Eval Moderate Complexity: 1 Mod          Katrece Roediger A. Gilford Rile PT, DPT [facility] - Acute Rehabilitation Services   Saburo Luger A Amon Costilla 10/25/2022, 1:50 PM

## 2022-10-25 NOTE — Consult Note (Signed)
1600: Follow-up observation, consult from yesterday.  Patient is sleeping.  He was given IM medication for agitation earlier in the day.  Writer spoke with his bedside RN.  She states he did well this morning but became very agitated and started pulling on his catheter.  This is similar to be incident that brought him in from the care home; patient was pulling on his catheter.   Yesterday, we decreased his Thorazine to 25 mg daily from 50 mg daily and Cogentin from 1 mg  3 times daily to 0.5 mg 3 times daily and effort to see any psychiatric medications were affecting his behaviors or adding to his symptoms.  Patient's condition is likely multifactorial.  Given report and his caregivers, patient certainly has cognitive decline.  I have added Thorazine 25 mg as needed to be given at the beginning of the increased agitation.   No other medication recommendations at this time. Informed current attending, Dr. Esaw Grandchild,  via secure chat.  Vanetta Mulders, PMHNP-BC

## 2022-10-26 ENCOUNTER — Inpatient Hospital Stay: Payer: 59

## 2022-10-26 DIAGNOSIS — N401 Enlarged prostate with lower urinary tract symptoms: Secondary | ICD-10-CM | POA: Diagnosis not present

## 2022-10-26 DIAGNOSIS — R634 Abnormal weight loss: Secondary | ICD-10-CM | POA: Diagnosis present

## 2022-10-26 DIAGNOSIS — G9341 Metabolic encephalopathy: Secondary | ICD-10-CM | POA: Diagnosis not present

## 2022-10-26 DIAGNOSIS — E43 Unspecified severe protein-calorie malnutrition: Secondary | ICD-10-CM | POA: Diagnosis present

## 2022-10-26 DIAGNOSIS — F251 Schizoaffective disorder, depressive type: Secondary | ICD-10-CM | POA: Diagnosis not present

## 2022-10-26 LAB — COMPREHENSIVE METABOLIC PANEL
ALT: 10 U/L (ref 0–44)
AST: 17 U/L (ref 15–41)
Albumin: 2.4 g/dL — ABNORMAL LOW (ref 3.5–5.0)
Alkaline Phosphatase: 68 U/L (ref 38–126)
Anion gap: 7 (ref 5–15)
BUN: 14 mg/dL (ref 8–23)
CO2: 26 mmol/L (ref 22–32)
Calcium: 8.2 mg/dL — ABNORMAL LOW (ref 8.9–10.3)
Chloride: 102 mmol/L (ref 98–111)
Creatinine, Ser: 0.81 mg/dL (ref 0.61–1.24)
GFR, Estimated: >60 mL/min (ref >60)
Glucose, Bld: 146 mg/dL — ABNORMAL HIGH (ref 70–99)
Potassium: 4.1 mmol/L (ref 3.5–5.1)
Sodium: 135 mmol/L (ref 135–145)
Total Bilirubin: 1.3 mg/dL — ABNORMAL HIGH (ref 0.3–1.2)
Total Protein: 5.6 g/dL — ABNORMAL LOW (ref 6.5–8.1)

## 2022-10-26 LAB — CBC
HCT: 32.8 % — ABNORMAL LOW (ref 39.0–52.0)
Hemoglobin: 10.6 g/dL — ABNORMAL LOW (ref 13.0–17.0)
MCH: 31.2 pg (ref 26.0–34.0)
MCHC: 32.3 g/dL (ref 30.0–36.0)
MCV: 96.5 fL (ref 80.0–100.0)
Platelets: 244 10*3/uL (ref 150–400)
RBC: 3.4 MIL/uL — ABNORMAL LOW (ref 4.22–5.81)
RDW: 15.9 % — ABNORMAL HIGH (ref 11.5–15.5)
WBC: 10.6 10*3/uL — ABNORMAL HIGH (ref 4.0–10.5)
nRBC: 0 % (ref 0.0–0.2)

## 2022-10-26 LAB — CBC WITH DIFFERENTIAL/PLATELET
Abs Immature Granulocytes: 0.02 10*3/uL (ref 0.00–0.07)
Basophils Absolute: 0 10*3/uL (ref 0.0–0.1)
Basophils Relative: 0 %
Eosinophils Absolute: 0 10*3/uL (ref 0.0–0.5)
Eosinophils Relative: 0 %
HCT: 31.2 % — ABNORMAL LOW (ref 39.0–52.0)
Hemoglobin: 9.9 g/dL — ABNORMAL LOW (ref 13.0–17.0)
Immature Granulocytes: 0 %
Lymphocytes Relative: 22 %
Lymphs Abs: 2.2 10*3/uL (ref 0.7–4.0)
MCH: 30.7 pg (ref 26.0–34.0)
MCHC: 31.7 g/dL (ref 30.0–36.0)
MCV: 96.6 fL (ref 80.0–100.0)
Monocytes Absolute: 0.7 10*3/uL (ref 0.1–1.0)
Monocytes Relative: 7 %
Neutro Abs: 6.8 10*3/uL (ref 1.7–7.7)
Neutrophils Relative %: 71 %
Platelets: 229 10*3/uL (ref 150–400)
RBC: 3.23 MIL/uL — ABNORMAL LOW (ref 4.22–5.81)
RDW: 15.9 % — ABNORMAL HIGH (ref 11.5–15.5)
WBC: 9.7 10*3/uL (ref 4.0–10.5)
nRBC: 0 % (ref 0.0–0.2)

## 2022-10-26 LAB — BASIC METABOLIC PANEL
Anion gap: 5 (ref 5–15)
BUN: 10 mg/dL (ref 8–23)
CO2: 29 mmol/L (ref 22–32)
Calcium: 8 mg/dL — ABNORMAL LOW (ref 8.9–10.3)
Chloride: 106 mmol/L (ref 98–111)
Creatinine, Ser: 0.71 mg/dL (ref 0.61–1.24)
GFR, Estimated: >60 mL/min (ref >60)
Glucose, Bld: 130 mg/dL — ABNORMAL HIGH (ref 70–99)
Potassium: 3.3 mmol/L — ABNORMAL LOW (ref 3.5–5.1)
Sodium: 140 mmol/L (ref 135–145)

## 2022-10-26 LAB — PROCALCITONIN: Procalcitonin: 0.52 ng/mL

## 2022-10-26 LAB — LACTIC ACID, PLASMA
Lactic Acid, Venous: 1.5 mmol/L (ref 0.5–1.9)
Lactic Acid, Venous: 1.3 mmol/L (ref 0.5–1.9)

## 2022-10-26 LAB — CK: Total CK: 25 U/L — ABNORMAL LOW (ref 49–397)

## 2022-10-26 LAB — MAGNESIUM: Magnesium: 2 mg/dL (ref 1.7–2.4)

## 2022-10-26 MED ORDER — POTASSIUM CHLORIDE CRYS ER 20 MEQ PO TBCR
40.0000 meq | EXTENDED_RELEASE_TABLET | Freq: Once | ORAL | Status: AC
  Administered 2022-10-26: 40 meq via ORAL
  Filled 2022-10-26: qty 2

## 2022-10-26 MED ORDER — LACTATED RINGERS IV BOLUS (SEPSIS)
500.0000 mL | Freq: Once | INTRAVENOUS | Status: AC
  Administered 2022-10-26: 500 mL via INTRAVENOUS

## 2022-10-26 MED ORDER — SODIUM CHLORIDE 0.9 % IV SOLN
2.0000 g | Freq: Once | INTRAVENOUS | Status: AC
  Administered 2022-10-26: 2 g via INTRAVENOUS
  Filled 2022-10-26: qty 12.5

## 2022-10-26 MED ORDER — SODIUM CHLORIDE 0.9 % IV SOLN
2.0000 g | Freq: Three times a day (TID) | INTRAVENOUS | Status: DC
  Administered 2022-10-27 – 2022-10-28 (×5): 2 g via INTRAVENOUS
  Filled 2022-10-26 (×4): qty 2
  Filled 2022-10-26: qty 12.5
  Filled 2022-10-26: qty 2

## 2022-10-26 MED ORDER — LACTATED RINGERS IV SOLN: INTRAVENOUS | Status: DC

## 2022-10-26 NOTE — Progress Notes (Signed)
Nutrition Follow-up  DOCUMENTATION CODES:   Severe malnutrition in context of chronic illness  INTERVENTION:   -Continue Ensure Enlive po TID, each supplement provides 350 kcal and 20 grams of protein.  -Continue MVI with minerals daily -Magic cup TID with meals, each supplement provides 290 kcal and 9 grams of protein  -Feeding assistance with meals  NUTRITION DIAGNOSIS:   Severe Malnutrition related to chronic illness (COPD) as evidenced by severe fat depletion, severe muscle depletion.  Ongoing  GOAL:   Patient will meet greater than or equal to 90% of their needs  Progressing   MONITOR:   PO intake, Supplement acceptance  REASON FOR ASSESSMENT:   Malnutrition Screening Tool    ASSESSMENT:   Pt with medical history significant for schizoaffective disorder, mild intellectual disability, nicotine dependence, COPD, generalized anxiety disorder , hypertension, hepatitis C, group home resident with guardians through DSS.  Reviewed I/O's: -1.2 L x 24 hours and -3 L since admission  UOP: 1.2 L x 24 hours   RD consulted due to pt group home and guardian report of significant wt loss. Reviewed wt hx; pt has experienced a 9.5% wt loss over the past 6 months, which is not significant for time frame. However, this is concerning given pt's history of malnutrition.   Pt lying in bed at time of visit. Breakfast tray mainly untouched except a few bites of pancake and milk. Pt less interactive today, but responded to questions with garbled, nonsensical language that RD was unable to understand. Noted pt with tremors which were not present during visit yesterday morning. Noted a cup of juice on the floor, which RD picked up and handed to the patient. Pt did struggle with picking up cups and holding it secondary to tremors. RD will add feeding assistance to help maximize intake.   Medications reviewed and include keppra.   Labs reviewed: K: 3.3.    Diet Order:   Diet Order              Diet regular Room service appropriate? Yes; Fluid consistency: Thin  Diet effective now                   EDUCATION NEEDS:   Education needs have been addressed  Skin:  Skin Assessment: Reviewed RN Assessment  Last BM:  Unknown  Height:   Ht Readings from Last 1 Encounters:  10/23/22 5\' 7"  (1.702 m)    Weight:   Wt Readings from Last 1 Encounters:  10/23/22 68.6 kg    Ideal Body Weight:  67.3 kg  BMI:  Body mass index is 23.69 kg/m.  Estimated Nutritional Needs:   Kcal:  2100-2300  Protein:  105-120 grams  Fluid:  > 2 L    10/25/22, RD, LDN, CDCES Registered Dietitian II Certified Diabetes Care and Education Specialist Please refer to [facility] for RD and/or RD on-call/weekend/after hours pager

## 2022-10-26 NOTE — Assessment & Plan Note (Signed)
Related to chronic illness (COPD) as evidenced by severe fat depletion, severe muscle depletion. --Appreciate dietitian recommendations --Started on Ensures, MVI --Monitor weight periodically --Encourage PO intake given intellectual disability and cognitive decline.

## 2022-10-26 NOTE — Assessment & Plan Note (Addendum)
11/30 afternoon: Febrile 101.3 F, tachycardic HR 115, and soft BP's persist.  Pt still encephalopathic as well, consistent with organ dysfunction and severe sepsis. 11/30 repeat chest x-ray showed worsening right-sided pneumonia -- Continue empiric Cefepime (cover for possible complicated UTI related to foley and urinary retention vs recurrent PNA) --Blood and urine culture pending --Lactic acid and procal are pending --Monitor fever curve, CBC

## 2022-10-26 NOTE — [place] FL2 (Signed)
[place] MEDICAID FL2 LEVEL OF CARE FORM     IDENTIFICATION  Patient Name: Ian Medina Birthdate: July 15, 1956 Sex: male Admission Date (Current Location): 10/24/2022  [facility] and [place] Number:  Chiropodist and Address:  [facility], [address], [place], [place] [address]      Provider Number: [id number]  Attending Physician Name and Address:  Pennie Banter, DO  Relative Name and Phone Number:       Current Level of Care: Hospital Recommended Level of Care: Skilled Nursing Facility Prior Approval Number:    Date Approved/Denied:   PASRR Number: [id number]  Discharge Plan: SNF    Current Diagnoses: Patient Active Problem List   Diagnosis Date Noted   Protein-calorie malnutrition, severe 10/26/2022   Unintentional weight loss 10/26/2022   Acute metabolic encephalopathy 10/24/2022   GERD (gastroesophageal reflux disease) 10/24/2022   Urinary retention due to benign prostatic hyperplasia 10/24/2022   Sepsis (HCC) 09/25/2022   Sepsis due to pneumonia (HCC) 09/24/2022   Schizoaffective disorder, depressive type (HCC)    Falls    Multiple rib fractures    Aggressive behavior 06/03/2022   Drug abuse (HCC) 08/23/2016   Cognitive decline 06/08/2014   Dysphagia 05/11/2014   Hepatitis C antibody test positive 03/12/2013   Encounter for screening colonoscopy 03/12/2013    Orientation RESPIRATION BLADDER Height & Weight      (AOx0, poor attention/concentration)  Normal External catheter Weight: 151 lb 3.8 oz (68.6 kg) Height:  5\' 7"  (170.2 cm)  BEHAVIORAL SYMPTOMS/MOOD NEUROLOGICAL BOWEL NUTRITION STATUS     (poor attention/concentration) Continent Diet (regular)  AMBULATORY STATUS COMMUNICATION OF NEEDS Skin   Limited Assist Does not communicate Other (Comment), Skin abrasions (abrasion/ecchymosis: arm/face/leg bilateral)                       Personal Care Assistance Level of Assistance  Bathing, Feeding, Dressing  Bathing Assistance: Limited assistance Feeding assistance: Limited assistance Dressing Assistance: Limited assistance     Functional Limitations Info  Speech, Hearing, Sight Sight Info: Adequate Hearing Info: Adequate Speech Info: Impaired    SPECIAL CARE FACTORS FREQUENCY  PT (By licensed PT), OT (By licensed OT)     PT Frequency: 5x a week OT Frequency: 5x a week            Contractures Contractures Info: Not present    Additional Factors Info  Code Status Code Status Info: Full             Current Medications (10/26/2022):  This is the current hospital active medication list Current Facility-Administered Medications  Medication Dose Route Frequency Provider Last Rate Last Admin   acetaminophen (TYLENOL) tablet 650 mg  650 mg Oral Q6H PRN Agbata, Tochukwu, MD       Or   acetaminophen (TYLENOL) suppository 650 mg  650 mg Rectal Q6H PRN Agbata, Tochukwu, MD       benztropine (COGENTIN) tablet 0.5 mg  0.5 mg Oral TID 10/28/2022 F, NP   0.5 mg at 10/26/22 10/28/22   Chlorhexidine Gluconate Cloth 2 % PADS 6 each  6 each Topical Daily 4315, MD   6 each at 10/25/22 0948   chlorproMAZINE (THORAZINE) tablet 25 mg  25 mg Oral Daily 10/27/22 F, NP   25 mg at 10/26/22 10/28/22   chlorproMAZINE (THORAZINE) tablet 25 mg  25 mg Oral Once PRN 4008, NP       feeding supplement (ENSURE ENLIVE /  ENSURE PLUS) liquid 237 mL  237 mL Oral TID BM Nicole Kindred A, DO       FLUoxetine (PROZAC) capsule 20 mg  20 mg Oral Daily Agbata, Tochukwu, MD   20 mg at 10/26/22 0842   fluticasone furoate-vilanterol (BREO ELLIPTA) 100-25 MCG/ACT 1 puff  1 puff Inhalation Daily Agbata, Tochukwu, MD   1 puff at 10/26/22 0841   And   umeclidinium bromide (INCRUSE ELLIPTA) 62.5 MCG/ACT 1 puff  1 puff Inhalation Daily Agbata, Tochukwu, MD   1 puff at 10/26/22 0841   gabapentin (NEURONTIN) capsule 100 mg  100 mg Oral TID Agbata, Tochukwu, MD   100 mg at 10/26/22 [id number]    haloperidol lactate (HALDOL) injection 1 mg  1 mg Intramuscular Q6H PRN Nicole Kindred A, DO       levETIRAcetam (KEPPRA) tablet 500 mg  500 mg Oral BID Agbata, Tochukwu, MD   500 mg at 10/26/22 0842   montelukast (SINGULAIR) tablet 10 mg  10 mg Oral QHS Agbata, Tochukwu, MD   10 mg at 10/25/22 2255   multivitamin with minerals tablet 1 tablet  1 tablet Oral Daily Nicole Kindred A, DO   1 tablet at 10/26/22 0841   OLANZapine (ZYPREXA) tablet 20 mg  20 mg Oral QHS Agbata, Tochukwu, MD   20 mg at 10/25/22 2256   ondansetron (ZOFRAN) tablet 4 mg  4 mg Oral Q6H PRN Agbata, Tochukwu, MD       Or   ondansetron (ZOFRAN) injection 4 mg  4 mg Intravenous Q6H PRN Agbata, Tochukwu, MD       pantoprazole (PROTONIX) EC tablet 40 mg  40 mg Oral Daily Agbata, Tochukwu, MD   40 mg at 10/26/22 0842   pravastatin (PRAVACHOL) tablet 20 mg  20 mg Oral q1800 Agbata, Tochukwu, MD   20 mg at 10/25/22 1501   tamsulosin (FLOMAX) capsule 0.4 mg  0.4 mg Oral QPM Agbata, Tochukwu, MD   0.4 mg at 10/25/22 1501   traZODone (DESYREL) tablet 50 mg  50 mg Oral QHS Agbata, Tochukwu, MD   50 mg at 10/25/22 2254   valbenazine (INGREZZA) capsule 40 mg  40 mg Oral Daily Waldon Merl F, NP   40 mg at 10/26/22 [id number]     Discharge Medications: Please see discharge summary for a list of discharge medications.  Relevant Imaging Results:  Relevant Lab Results:   Additional Information SSN [id number]  Colen Darling, LCSWA

## 2022-10-26 NOTE — TOC Progression Note (Signed)
Transition of Care [facility]) - Progression Note    Patient Details  Name: Ian Medina MRN: [id number] Date of Birth: 1956/07/25  Transition of [facility]) CM/SW Contact  Tempie Hoist, [place] Phone Number: 10/26/2022, 2:23 PM  Clinical Narrative:      TOC spoke to [facility] [phone], patient's legal guardian. Patient was staying at [facility] on [address] [place], [place]. Patient unable to return to group home.  Guardian [name] is requesting a referral to [facility] [place], [place]. TOC working on [employer]. Patient was at this SNF earlier this month.      Expected Discharge Plan and Services    [facility] [place], [place]                                             Social Determinants of Health (SDOH) Interventions    Readmission Risk Interventions     No data to display

## 2022-10-26 NOTE — Progress Notes (Addendum)
Progress Note   Patient: Ian Medina ZHG:[id number] DOB: Mar 04, 1956 DOA: 10/24/2022     2 DOS: the patient was seen and examined on 10/26/2022   Brief hospital course: [name] is a 66 y.o. male with medical history significant for schizoaffective disorder, mild intellectual disability, nicotine dependence, COPD, generalized anxiety disorder , hypertension, hepatitis C, group home resident with guardians through DSS.  He was brought into the ED on 10/24/2022 for evaluation of frequent falls, worsening cognitive decline and decrease in his ability to carry out his ADL's.  He had been seen in the ED twice recently for falls.  CT's of head, neck and face showed only a R zygomatic fracture.  He was found to have acute urinary retention at prior ED visit, a Foley catheter was placed.  Patient's legal guardian [name] reports ongoing concern about patient's progressive decline which she describes as worsening confusion, frequent falls and inability to carry out his activities of daily living.  She also notes significant weight loss recently.   Patient was recently hospitalized from 09/24/22  through 09/28/22 for a similar presentation and at that time was found to have pneumonia and multiple rib fractures.  Per documentation his mental status improved and he was discharged to a SNF.  Pt admitted to the hospital for further evaluation and management.   11/30: afternoon pt febrile, tachycardic, meeting sepsis criteria. Code sepsis initiated.  Antibiotics started and further evaluation for source of infection underway.  UTI vs worsening pneumonia with probable aspiration.  Fluid resuscitation underway.  BP's low, but MAP so far holding > 65.  PCCM made aware, in case of need for pressors.   Assessment and Plan: * Acute metabolic encephalopathy Unclear etiology, evaluation ongoing. Presented with progressively worsening mental status changes - very confused and oriented only to person (per guardian  states is unusual for him).  Concern for possible dementia in this patient who has CT scan findings suggestive of encephalomalacia in the left temporal lobe. Ammonia, B12, RPR level normal. UDS positive only for tricyclics. Covid negative. UA bloody, rare bacteria, unable to check nitrites or leukocytes. CXR with incomplete resolution of airspace disease in the right lung (recently had PNA, currently without respiratory symptoms). --Urine culture ordered --Psych consulted to review medication regimen --Delirium precautions  Urinary retention due to benign prostatic hyperplasia Noted to have urinary retention during his last ER visit and had a Foley catheter placed. Presented this time with gross hematuria, suspect urethral trauma from insertion vs pt pulling on the Foley. PSA level is normal. --Started on Flomax --Consult to urology --Will d/c Foley for voiding trial --Bladder scans to monitor for recurrent retention  Schizoaffective disorder, depressive type [facility]) Patient is on multiple antipsychotic agents which include Cogentin, Thorazine, Prozac, Zyprexa, trazodone and Ingrezza --Psych consulted on admission --Meds continued --PRN IM Haldol for agitation --Thorazine PRN at beginning of agitation  Addendum 11/29 -- persistent agitation despite Haldol.  IM Geodon ordered and psych was updated.  Unintentional weight loss Per chart review, pt has had 9.5% wt loss over the past 6 months, which is not considered significant for time frame, but given malnutrition is concerning.   --Needs outpatient colonoscopy if due for this (no prior records available) --Monitor weights --See malnutrition  Protein-calorie malnutrition, severe Related to chronic illness (COPD) as evidenced by severe fat depletion, severe muscle depletion. --Appreciate dietitian recommendations --Started on Ensures, MVI --Monitor weight periodically --Encourage PO intake given intellectual disability and cognitive  decline.   GERD (gastroesophageal reflux disease)  Continue PPI  Cognitive decline Treatment as outlined in 1  Falls Patient presents for evaluation of multiple falls and was seen in the ER 2 days prior to this admission.  CT's during prior ED visit revealed partially covered segmental fracture of the right zygoma.  No acute intracranial or C-spine findings. --Fall precautions --PT evaluation         Subjective: Patient sleeping soundly on rounds this AM.  He had significant agitation requiring PRN medications yesterday afternoon.  He reportedly gets agitated and pulls at his foley catheter.  Physical Exam: Vitals:   10/25/22 1607 10/25/22 2012 10/26/22 0543 10/26/22 0806  BP: 96/75 90/64 100/69 (!) 94/58  Pulse: 98 87 80 84  Resp: 16 17 16 19   Temp: 98.7 F (37.1 C) 98.9 F (37.2 C) 98.7 F (37.1 C) 99.4 F (37.4 C)  TempSrc:      SpO2:  97% 94% 96%  Weight:      Height:       General exam: sleeping soundly, no acute distress, underweight HEENT: Right periorbital ecchymosis beginning to fade, scattered small healing abrasions/scabs, moist mucus membranes, hearing grossly normal  Respiratory system: lungs clear, normal respiratory effort. Cardiovascular system: normal S1/S2, RRR, no pedal edema.   Gastrointestinal system: soft, NT, ND, no HSM felt, +bowel sounds. Central nervous system: unable to assess due to somnolence Extremities: moves all, no edema, normal tone Skin: dry, intact, normal temperature Psychiatry: unable to assess due to somnolence   Data Reviewed:  Notable labs --- K 3.3, glucose 130, Ca 8.0, WBC 10.6, Hbg 10.6 from 9.9  .    Urine culture pending  Family Communication: Legal guardian at bedside during encounter 11/29. None present today on rounds, will attempt to call.  Disposition: Status is: Inpatient Remains inpatient appropriate because:  Unable to return to group home, requires SNF placement.   Planned Discharge Destination: Skilled  nursing facility     Time spent: 45 minutes  Author: Ezekiel Slocumb, DO 10/26/2022 3:01 PM  For on call review [email or web address].

## 2022-10-26 NOTE — Sepsis Progress Note (Signed)
Sepsis protocol monitored by eLink 

## 2022-10-26 NOTE — Progress Notes (Signed)
Pharmacy Antibiotic Note  Ian Medina is a 66 y.o. male admitted on 10/24/2022 with acute metabolic encephalopathy   Pharmacy has been consulted for cefepime dosing for suspected UTI/sepsis.  Plan: Cefepime 2 gm IV Q8H  Height: 5\' 7"  (170.2 cm) Weight: 68.6 kg (151 lb 3.8 oz) IBW/kg (Calculated) : 66.1  Temp (24hrs), Avg:99.6 F (37.6 C), Min:98.7 F (37.1 C), Max:101.3 F (38.5 C)  Recent Labs  Lab 10/22/22 1338 10/23/22 2359 10/25/22 0553 10/26/22 0430  WBC 10.1 11.7* 11.0* 10.6*  CREATININE 0.92 0.76 0.58* 0.71    Estimated Creatinine Clearance: 87.2 mL/min (by C-G formula based on SCr of 0.71 mg/dL).    Allergies  Allergen Reactions   Propoxyphene Rash    Antimicrobials this admission: Cefepime 11/30 >>   Dose adjustments this admission: N/A  Microbiology results: 11/30 Bcx (single set): pending 11/30 UCx: pending    Thank you for allowing pharmacy to be a part of this patient's care.  12/30 10/26/2022 5:22 PM

## 2022-10-26 NOTE — Progress Notes (Signed)
Bp better see flowsheet, Md here patient assessed. Patient more alert.

## 2022-10-26 NOTE — Progress Notes (Signed)
Rapid Response Event Note   Reason for Call : AMS, hypotension   Initial Focused Assessment: On my arrival pt is very lethargic, responds to pain. Pt admitted for AMS. Temp 103.1. BP 82/57 with a MAP of 66.  Primary RN starting 2nd IV.  O2 sats stable on 2L [place]. Pt has legal gaurdian.    Interventions: Pt receiving 1L bolus. Tylenol given for fever. Dr. Denton Lank paged. If fluid bolus does not help improve BP, will transfer to ICU for pressors per Dr. Oran Rein.    Plan of Care: SD orders placed for this patient for closer monitoring however pt's BP improved after fluid bolus and pt is more alert. Dr. Denton Lank notified of current BP and canceled transfer order. Pt to stay on 1C for now per Dr. Denton Lank. BP 105/72 with MAP of 83.    Event Summary:   MD Notified: Dr. Denton Lank Call Time: 1713 Arrival Time: 1745 (d/t rapid on another pt) End Time: 1815  Henrene Dodge, RN

## 2022-10-26 NOTE — Assessment & Plan Note (Signed)
Per chart review, pt has had 9.5% wt loss over the past 6 months, which is not considered significant for time frame, but given malnutrition is concerning.   --Needs outpatient colonoscopy if due for this (no prior records available) --Monitor weights --See malnutrition

## 2022-10-26 NOTE — Progress Notes (Signed)
OT Cancellation Note  Patient Details Name: Ian Medina MRN: [id number] DOB: [birth year]   Cancelled Treatment:    Reason Eval/Treat Not Completed: Fatigue/lethargy limiting ability to participate. Spoke with PT who just attempted to see pt. Per PT, pt lethargic, unable to maintain alertness for session participation. Inappropriate for therapy at this time. Will re-attempt at later date/time as pt is more appropriate.   Arman Filter., MPH, MS, OTR/L ascom [phone] 10/26/22, 3:09 PM

## 2022-10-26 NOTE — Progress Notes (Signed)
PT Cancellation Note  Patient Details Name: Ian Medina MRN: [id number] DOB: Apr 09, 1956   Cancelled Treatment:    Reason Eval/Treat Not Completed: Patient's level of consciousness;Fatigue/lethargy limiting ability to participate With max stimulation, patient lethargic and unable to maintain alertness. Attempted to bring patient forward away from bed but continued to be lethargic. Not appropriate for therapy at this time. Will re-attempt at later date/time.   Kinzee Happel A. Dan Humphreys PT, DPT [facility] - Acute Rehabilitation Services    Tiney Zipper A Kynedi Profitt 10/26/2022, 3:42 PM

## 2022-10-26 NOTE — Progress Notes (Signed)
RRT called As ordered by Md. Vital signs as noted . New IV started x2 , LR bolus as noted on MAR. Little improvement of BP. Temp now ** Axillary . New orders pending for transfer to ICU

## 2022-10-26 NOTE — Progress Notes (Signed)
Temp 101.3 axillary, heart rate 115 red mews. Vitals signs increased per protocol, Md notified new orders pending.

## 2022-10-27 DIAGNOSIS — Z515 Encounter for palliative care: Secondary | ICD-10-CM

## 2022-10-27 DIAGNOSIS — R4189 Other symptoms and signs involving cognitive functions and awareness: Secondary | ICD-10-CM | POA: Diagnosis not present

## 2022-10-27 DIAGNOSIS — A419 Sepsis, unspecified organism: Secondary | ICD-10-CM | POA: Diagnosis not present

## 2022-10-27 DIAGNOSIS — E43 Unspecified severe protein-calorie malnutrition: Secondary | ICD-10-CM | POA: Diagnosis not present

## 2022-10-27 DIAGNOSIS — F250 Schizoaffective disorder, bipolar type: Secondary | ICD-10-CM | POA: Diagnosis not present

## 2022-10-27 DIAGNOSIS — N401 Enlarged prostate with lower urinary tract symptoms: Secondary | ICD-10-CM | POA: Diagnosis not present

## 2022-10-27 DIAGNOSIS — W19XXXD Unspecified fall, subsequent encounter: Secondary | ICD-10-CM | POA: Diagnosis not present

## 2022-10-27 DIAGNOSIS — R1313 Dysphagia, pharyngeal phase: Secondary | ICD-10-CM

## 2022-10-27 DIAGNOSIS — R6520 Severe sepsis without septic shock: Secondary | ICD-10-CM

## 2022-10-27 DIAGNOSIS — G9341 Metabolic encephalopathy: Secondary | ICD-10-CM | POA: Diagnosis not present

## 2022-10-27 MED ORDER — BENZTROPINE MESYLATE 1 MG PO TABS
0.5000 mg | ORAL_TABLET | Freq: Two times a day (BID) | ORAL | Status: DC
  Administered 2022-10-28 – 2022-11-09 (×25): 0.5 mg via ORAL
  Filled 2022-10-27 (×25): qty 1

## 2022-10-27 MED ORDER — HALOPERIDOL LACTATE 5 MG/ML IJ SOLN: 1.0000 mg | Freq: Four times a day (QID) | INTRAMUSCULAR | Status: DC | PRN

## 2022-10-27 MED ORDER — TRAZODONE HCL 50 MG PO TABS
50.0000 mg | ORAL_TABLET | Freq: Every evening | ORAL | Status: DC | PRN
  Administered 2022-10-28 – 2022-11-08 (×9): 50 mg via ORAL
  Filled 2022-10-27 (×9): qty 1

## 2022-10-27 MED ORDER — CHLORPROMAZINE HCL 25 MG PO TABS
25.0000 mg | ORAL_TABLET | Freq: Every day | ORAL | Status: DC | PRN
  Administered 2022-11-01: 25 mg via ORAL
  Filled 2022-10-27: qty 1

## 2022-10-27 MED ORDER — CHLORPROMAZINE HCL 25 MG/ML IJ SOLN
25.0000 mg | Freq: Every day | INTRAMUSCULAR | Status: DC | PRN
  Administered 2022-10-29: 25 mg via INTRAMUSCULAR
  Filled 2022-10-27 (×2): qty 1

## 2022-10-27 NOTE — Care Management Important Message (Signed)
Important Message  Patient Details  Name: Ian Medina MRN: [id number] Date of Birth: Sep 15, 1956   Medicare Important Message Given:  Yes  Reviewed Medicare IM with [name], legal guardian, at [phone].  Copy of Medicare IM left in room for reference.    Johnell Comings 10/27/2022, 12:27 PM

## 2022-10-27 NOTE — Progress Notes (Signed)
Progress Note   Patient: Ian Medina [id number] DOB: 06-11-1956 DOA: 10/24/2022     3 DOS: the patient was seen and examined on 10/27/2022   Brief hospital course: Ian Medina is a 66 y.o. male with medical history significant for schizoaffective disorder, mild intellectual disability, nicotine dependence, COPD, generalized anxiety disorder , hypertension, hepatitis C, group home resident with guardians through [facility].  He was brought into the ED on 10/24/2022 for evaluation of frequent falls, worsening cognitive decline and decrease in his ability to carry out his ADL's.  He had been seen in the ED twice recently for falls.  CT's of head, neck and face showed only a R zygomatic fracture.  He was found to have acute urinary retention at prior ED visit, a Foley catheter was placed.  Patient's legal guardian [name] reports ongoing concern about patient's progressive decline which she describes as worsening confusion, frequent falls and inability to carry out his activities of daily living.  She also notes significant weight loss recently.   Patient was recently hospitalized from 09/24/22  through 09/28/22 for a similar presentation and at that time was found to have pneumonia and multiple rib fractures.  Per documentation his mental status improved and he was discharged to a SNF.  Pt admitted to the hospital for further evaluation and management.   11/30: afternoon pt febrile, tachycardic, meeting sepsis criteria. Code sepsis initiated.  Antibiotics started and further evaluation for source of infection underway.  UTI vs worsening pneumonia with probable aspiration.  Fluid resuscitation underway.  BP's low, but MAP so far holding > 65.  PCCM made aware, in case of need for pressors.   Assessment and Plan: * Acute metabolic encephalopathy Most likely related to worsening infection, progression of pneumonia likely aspiration, also with UTI.  Presented with progressively worsening mental status  changes - very confused and oriented only to person (per guardian states is unusual for him).  Concern for possible dementia in this patient who has CT scan findings suggestive of encephalomalacia in the left temporal lobe. Ammonia, B12, RPR level normal. UDS positive only for tricyclics. Covid negative. UA bloody, rare bacteria, unable to check nitrites or leukocytes.  Urine culture growing Staph aureus and Enterococcus faecalis CXR with incomplete resolution of airspace disease in the right lung (recently had PNA, currently without respiratory symptoms). -- Follow urine and blood cultures --Continue to biotics as outlined --Psych consulted to review medications --Delirium precautions  Urinary retention due to benign prostatic hyperplasia Noted to have urinary retention during his last ER visit and had a Foley catheter placed. Presented this time with gross hematuria, suspect urethral trauma from insertion vs pt pulling on the Foley. PSA level is normal. 11/30: DC'd Foley for voiding trial 12/1: Appears ongoing retention on bladder scans, expect will need in/out cath --Started on Flomax --Consult to urology -- Hope to avoid Foley due to causing agitation  --Bladder scans to monitor for recurrent retention  Schizoaffective disorder, depressive type [facility]) Patient is on multiple antipsychotic agents which include Cogentin, Thorazine, Prozac, Zyprexa, trazodone and Ingrezza --Psych consulted on admission --Meds continued --PRN IM Haldol for agitation --Thorazine PRN at beginning of agitation  Addendum 11/29 -- persistent agitation despite Haldol.  IM Geodon ordered and psych was updated.  Dysphagia Chronic since at least 2015, has progressed over time.  Higher admission seen by speech therapy and noted to have aspiration with both thin and thickened liquids.  Now with worsening recurrent right-sided pneumonia. -- SLP following --Plan for MBSS on  Monday --Pured diet with honey  thickened liquids --Low threshold to make n.p.o. --Palliative care consulted for goals of care  Unintentional weight loss Per chart review, pt has had 9.5% wt loss over the past 6 months, which is not considered significant for time frame, but given malnutrition is concerning.   --Needs outpatient colonoscopy if due for this (no prior records available) --Monitor weights --See malnutrition  Protein-calorie malnutrition, severe Related to chronic illness (COPD) as evidenced by severe fat depletion, severe muscle depletion. --Appreciate dietitian recommendations --Started on Ensures, MVI --Monitor weight periodically --Encourage PO intake given intellectual disability and cognitive decline.   GERD (gastroesophageal reflux disease) Continue PPI  Cognitive decline Treatment as outlined in 1  Severe sepsis ([place]) 11/30 afternoon: Febrile 101.3 F, tachycardic HR 115, and soft BP's persist.  Pt still encephalopathic as well, consistent with organ dysfunction and severe sepsis. 11/30 repeat chest x-ray showed worsening right-sided pneumonia -- Continue empiric Cefepime (cover for possible complicated UTI related to foley and urinary retention vs recurrent PNA) --Blood and urine culture pending --Lactic acid and procal are pending --Monitor fever curve, CBC  Falls Patient presents for evaluation of multiple falls and was seen in the ER 2 days prior to this admission.  CT's during prior ED visit revealed partially covered segmental fracture of the right zygoma.  No acute intracranial or C-spine findings. --Fall precautions --PT evaluation         Subjective: Patient is resting in bed with blanket over his head.  He removed it to speak with me and reports feeling better today.  He does admit to getting choked with swallowing.  Denies any specific complaints at this time.  Pulse blanket back over his head.  Physical Exam: Vitals:   10/27/22 0020 10/27/22 0518 10/27/22 0846 10/27/22  1119  BP: 91/69 94/68 90/64  (!) 84/58  Pulse: 86 99 (!) 110 87  Resp: 20 18 16 18   Temp: 98.7 F (37.1 C) 98 F (36.7 C) 98.4 F (36.9 C) 98.9 F (37.2 C)  TempSrc:  Oral Oral   SpO2: 97% 90% 93% 93%  Weight:      Height:       General exam: Awake resting in bed, no acute distress, underweight HEENT: Right periorbital ecchymosis fading, scattered small healing abrasions/scabs, moist mucus membranes, hearing grossly normal  Respiratory system: Diffuse rhonchi right more than left, normal respiratory effort. Cardiovascular system: normal S1/S2, RRR, no pedal edema.   Gastrointestinal system: soft, nontender abdomen Central nervous system: Grossly nonfocal exam, normal speech Extremities: moves all, no edema, normal tone Skin: dry, intact, normal temperature Psychiatry: Normal mood, no agitation or apparent hallucinations   Data Reviewed:  Notable labs --- metabolic panel with glucose 146, calcium 8.2, albumin 2.4, T. bili 1.3.  Procalcitonin 0.52.  Lactic acid 1.5, 1.3.  CBC with resolved leukocytosis, hemoglobin stable 9.9  .    11/29 Urine culture pending - growing Staph aureus and Enterococcus faecalis  11/30 chest x-ray showed worsening of right-sided pneumonia, highly suspicious for aspiration. IMPRESSION: Chronic emphysematous changes with chronic scarring within the lateral right mid lung. Increased opacification in this region compared to the most recent 10/24/2022 radiograph, with the current appearance more similar to pneumonia seen on prior 10/01/2022 and 09/24/2022 radiographs. This is favored to represent recurrence of pneumonia in this region.  11/30 Blood culture pending - negative to date   Family Communication: Legal guardian updated in person 11/29, by phone 11/30, 12/1.  Disposition: Status is: Inpatient Remains inpatient appropriate  because:  Unable to return to group home, requires SNF placement.   Planned Discharge Destination: Skilled nursing  facility     Time spent: 45 minutes  Author: Pennie Banter, DO 10/27/2022 1:16 PM  For on call review [email or web address].

## 2022-10-27 NOTE — Consult Note (Signed)
Consultation Note Date: 10/27/2022 at 0915  Patient Name: Ian Medina  DOB: [birth year]  MRN: [id number]  Age / Sex: 66 y.o., male  PCP: Lorelee Market, MD Referring Physician: Pennie Banter, DO  Reason for Consultation: Establishing goals of care  HPI/Patient Profile: 66 y.o. male  with past medical history of schizoaffective disorder, mild intellectual disability, nicotine dependence, COPD, GAD, HTN, hep C, and frequent falls admitted on 10/24/2022 for evaluation of frequent falls, worsening cognitive decline, and decrease in ability to carry out ADLs.  Upon arrival to ED, patient CT of head neck and face revealed right zygomatic fracture.  Patient also found to have acute urinary retention and Foley was placed.  Patient is being treated for acute metabolic encephalopathy, urinary retention, and cognitive decline with falls.  PMT was consulted to discuss goals of care..   Clinical Assessment and Goals of Care: I have reviewed medical records including EPIC notes, labs and imaging, assessed the patient (patient unable to participate in discussions independently) and then spoke with patient's guardian Ian Medina over the phone to discuss diagnosis prognosis, GOC, EOL wishes, disposition and options.  I introduced Palliative Medicine as specialized medical care for people living with serious illness. It focuses on providing relief from the symptoms and stress of a serious illness. The goal is to improve quality of life for both the patient and the family.  As far as functional and nutritional status Ian Medina endorses a significant decline over the past few months.  She shares patient was up, walking, and able to perform ADLs almost completely independently.  She endorses he had a healthy appetite but that he continue to lose weight. She says patient was well over 200 pounds about 6 months ago - now about 160  pounds.  She says he has just "not been himself".  We discussed patient's current illness and what it means in the larger context of patient's on-going co-morbidities.  Reviewed likely multifactorial nature of current functional, nutritional, and cognitive status. I conveyed that patient was eating/drinking during my assessment and had acute c/o pain/discomfort. In addition to patient's psychological medications being closely monitored, other medical root causes are being investigated by medical team.  I attempted to elicit values and goals of care important to the patient.  Ian Medina shows that she has never discussed advanced care decisions with patient.  Advance directives, concepts specific to code status, artificial feeding and hydration, and rehospitalization were considered and discussed.  As a guardian, Ian Medina says would heed advise of medical staff in regards to CODE STATUS and medical decision making.  Given there is nothing urgent or emergent about patient's current treatment plan, full code and full scope remains.  I highlighted to Ian Medina the importance of continued conversation with the medical providers regarding overall plan of care and treatment options, ensuring decisions are within the context of the patient's values and goals of care.  Ian Medina is grateful that patient is getting a complete medical workup.  She has had concern recently that something other than his psychological  medication adjustments/regimen are contributing to his overall decline.    Awaiting full medical workup to see what, if any, etiology can be determined for patient's altered mental faculties as well as functional decline.   Questions and concerns were addressed. Ian Medina was encouraged to call with questions or concerns. Time for outcomes needed. Ian Medina has PMT contact number, was made aware that there is no in person provider from PMT at [facility] over the weekend, and that PMT will remain available to patient/Carrie  throughout his hospitalization.   Primary Decision Maker LEGAL GUARDIAN  Physical Exam Constitutional:      Appearance: Normal appearance.  HENT:     Head: Normocephalic.     Mouth/Throat:     Mouth: Mucous membranes are moist.  Eyes:     Pupils: Pupils are equal, round, and reactive to light.  Cardiovascular:     Rate and Rhythm: Normal rate.     Pulses: Normal pulses.  Pulmonary:     Effort: Pulmonary effort is normal.  Abdominal:     Palpations: Abdomen is soft.  Musculoskeletal:        General: Normal range of motion.     Comments: MAETC  Skin:    General: Skin is warm and dry.  Neurological:     Mental Status: He is alert.     Comments: Oriented to self, tangential conversation, non-linear  Psychiatric:        Mood and Affect: Mood normal.     Palliative Assessment/Data: 50-60%     Thank you for this consult. Palliative medicine will continue to follow and assist holistically.   Time Total: 75 minutes Greater than 50%  of this time was spent counseling and coordinating care related to the above assessment and plan.  Signed by: Georgiann Cocker, DNP, FNP-BC Palliative Medicine    Please contact Palliative Medicine Team phone at [phone] for questions and concerns.  For individual provider: See Loretha Stapler

## 2022-10-27 NOTE — Progress Notes (Signed)
   10/26/22 1629  Assess: MEWS Score  Temp (!) 101.3 F (38.5 C)  BP 91/66  MAP (mmHg) 75  Pulse Rate (!) 115  Resp 19  SpO2 (!) 87 %  O2 Device Room Air  Assess: MEWS Score  MEWS Temp 1  MEWS Systolic 1  MEWS Pulse 2  MEWS RR 0  MEWS LOC 0  MEWS Score 4  MEWS Score Color Red  Assess: if the MEWS score is Yellow or Red  Were vital signs taken at a resting state? Yes  Focused Assessment No change from prior assessment  Does the patient meet 2 or more of the SIRS criteria? Yes  Does the patient have a confirmed or suspected source of infection? No  MEWS guidelines implemented *See Row Information* Yes  Treat  MEWS Interventions Escalated (See documentation below)  Pain Scale 0-10  Pain Score 0  Take Vital Signs  Increase Vital Sign Frequency  Red: Q 1hr X 4 then Q 4hr X 4, if remains red, continue Q 4hrs  Escalate  MEWS: Escalate Red: discuss with charge nurse/RN and provider, consider discussing with RRT  Notify: Charge Nurse/RN  Name of Charge Nurse/RN Notified Verl Bangs RN  Date Charge Nurse/RN Notified 10/26/22  Time Charge Nurse/RN Notified 1630  Provider Notification  Provider Name/Title Dr Denton Lank  Date Provider Notified 10/26/22  Time Provider Notified 1000  Method of Notification Page  Notification Reason Other (Comment) (RED MEWS)  Date of Provider Response 10/26/22  Time of Provider Response 1704  Notify: Rapid Response  Name of Rapid Response RN Notified Meghan Furr  Date Rapid Response Notified 10/26/22  Time Rapid Response Notified 1713  Document  Patient Outcome Stabilized after interventions  Assess: SIRS CRITERIA  SIRS Temperature  1  SIRS Pulse 1  SIRS Respirations  0  SIRS WBC 1  SIRS Score Sum  3   Inserted for Waylan Rocher RN

## 2022-10-27 NOTE — Assessment & Plan Note (Signed)
Chronic since at least 2015, has progressed over time.  Higher admission seen by speech therapy and noted to have aspiration with both thin and thickened liquids.  Now with worsening recurrent right-sided pneumonia. -- SLP following --Plan for MBSS on Monday --Pured diet with honey thickened liquids --Low threshold to make n.p.o. --Palliative care consulted for goals of care

## 2022-10-27 NOTE — Progress Notes (Signed)
PT Cancellation Note  Patient Details Name: Ian Medina MRN: [id number] DOB: 23-Mar-1956   Cancelled Treatment:    Reason Eval/Treat Not Completed: Patient declined, no reason specified Patient declining mobility despite max encouragement. Unintelligible speech at times. Will re-attempt at later date/time as appropriate.   Alera Quevedo A. Dan Humphreys PT, DPT [facility] - Acute Rehabilitation Services    Larue Drawdy A Chassity Ludke 10/27/2022, 3:19 PM

## 2022-10-27 NOTE — TOC Progression Note (Addendum)
Transition of Care [facility]) - Progression Note    Patient Details  Name: Ian Medina MRN: [id number] Date of Birth: [birth year]  [facility] CM/SW Contact  Tempie Hoist, [place] Phone Number: 10/27/2022, 11:00 AM  Clinical Narrative:      TOC sent a referral to [facility]. Patient was at this SNF earlier this month.   TOC sent additional SNF referrals. Patient denied admission to [facility].    Expected Discharge Plan and Services      SNF                                           Social Determinants of Health (SDOH) Interventions    Readmission Risk Interventions     No data to display

## 2022-10-27 NOTE — Consult Note (Addendum)
Clinical/Bedside Swallow Evaluation Patient Details  Name: Ian Medina MRN: [id number] Date of Birth: 10-23-1956  Today's Date: 10/27/2022 Time: SLP Start Time (ACUTE ONLY): [id number] SLP Stop Time (ACUTE ONLY): 1008 SLP Time Calculation (min) (ACUTE ONLY): 13 min  Past Medical History:  Past Medical History:  Diagnosis Date   Anxiety    Atherosclerosis of aorta (HCC)    Auditory hallucination    Cerebral aneurysm, nonruptured    COPD with emphysema ([place])    Drug abuse ([facility])    Dysphagia, oropharyngeal phase    GAD (generalized anxiety disorder)    GERD (gastroesophageal reflux disease)    Hepatitis C    Hypercholesterolemia    Hypertension    Middle cerebral artery aneurysm    Mild intellectual disabilities    Paranoia ([place])    Pneumonia 2017   Psoriasis    Psoriasis vulgaris    Repeated falls    Schizoaffective disorder, depressive type ([place])    Schizophrenia ([place])    diagnosed at age 34, treated by ACT team   Past Surgical History:  Past Surgical History:  Procedure Laterality Date   CHEST TUBE INSERTION     COLONOSCOPY  Nov 2011   Dr. Earley Brooke: normal, internal hemorrhoids   COLONOSCOPY  Sept 2009   small internal hemorrhoids, normal    ESOPHAGOGASTRODUODENOSCOPY  June 2010   Dr. Earley Brooke: normal   ESOPHAGOGASTRODUODENOSCOPY  Jan 2009   Dr. Posey Pronto: linear esophagitis, mild stricture, diffuse gastritis and mild duodenitis, PATH: chronic gastritis, negative H.pylori    FOOT SURGERY     right   HPI:  Ian Medina is a 66 y.o. male with medical history significant for schizoaffective disorder, mild intellectual disability, nicotine dependence, COPD, generalized anxiety disorder , hypertension, hepatitis C who was brought into the ER by EMS for evaluation of frequent falls, worsening cognitive decline and decrease in his ability to carry out his ADL's.  Patient has been seen twice in the emergency room for falls.  Had a CT  scan of the head and C-spine/face which did not show  any acute pathology 2 days prior to this admission.  He was noted to have urinary retention during that visit and had a Foley catheter placed. During my evaluation he is unable to provide any history and appears very confused. He has ecchymoses involving his right eye. Called and spoke to patient's legal guardian Ian Medina in detail and she is concerned about patient's progressive decline which she describes as worsening confusion, frequent falls and inability to carry out his activities of daily living. Patient was recently hospitalized from 09/24/22  through 09/28/22 for a similar presentation and at that time was found to have pneumonia and multiple rib fractures.  Per documentation his mental status improved and he was discharged to a SNF.  RRT called 11/30 Overnight. Vital signs as noted . New IV started x2 , LR bolus as noted on MAR. Little improvement of BP. New orders pending for transfer to [place] is very lethargic, responds to pain. Pt admitted for AMS. Temp 103.1. BP 82/57 with a MAP of 66.  Primary RN starting 2nd IV.  O2 sats stable on 2L [place]. Pt has legal gaurdian. BP improved after fluid bolus and pt is more alert. Dr. Arbutus Ped notified of current BP and canceled transfer order. Pt to stay on 1C for now per Dr. Arbutus Ped. BP 105/72 with MAP of 83.  Clinical Indicators WBC: 9.7 Temp: Fever overnight 103 Lung Status: [place] 3L  CXR 11/30 Chronic emphysematous changes  with chronic scarring within the lateral right mid lung. Increased opacification in this region compared to the most recent 10/24/2022 radiograph, with the current appearance more similar to pneumonia seen on prior 10/01/2022 and 09/24/2022 radiographs. This is favored to represent recurrence of pneumonia in this region. CXR 11/28 Incomplete resolution of airspace disease in the right lung, recommend continued follow-up to clearing. Head CT: No acute or interval finding.    Assessment / Plan / Recommendation  Clinical Impression   Patient presents with high risk of aspiration given baseline dysphagia (chronic since 2015 though worse per October 2023 with aspiration of thin (silent) and mildly thick liquids (not silent). C/f inability to tolerate aspiration from a pulmonary standpoint per readmissions and chest imaging with increased opacities. Risk factors for aspiration pna include: COPD, cognitive deficits, hx of dysphagia, and dependence for oral care/intake.   NO overt s/s aspiration with moderately thick liquids by cup sips after 4oz completed with minimal prompting for slowed rate "take a break" during sips. Oral deficits iso absent dentition resulted in incomplete breakdown of DYS 2 minced solids as they remained whole and could not be cleared after prolonged period and puree wash. NO s/s of oral/pharyngeal dysphagia with puree trials.   Recommend repeat instrumental exam to objectively assess moderately thick liquids though exam not available today per radiology capacity. SLP d/w case MD Arbutus Ped. Plan per interdisciplinary discussion is  cautious trial of puree solids and moderately thick (honey thick) liquids until MBSS completed with LOW THRESHOLD to make NPO if clinical s/s of intolerance (elevated WBC, temp, respiratory decline, cognitive decline or coughing with intake). Strict aspiration precautions. SLP team to follow up next date for dysphagia management.  100% total supervision for slowed rate and clinical monitoring during intake as above.    SLP Visit Diagnosis: Dysphagia, oropharyngeal phase (R13.12)    Aspiration Risk  Severe aspiration risk    Diet Recommendation   PUREE DIET MODERATELY THICK LIQUIDS (HONEY THICK)  Compensatory Strategies:  NO STRAWS SLOW RATE--NEEDS CUES "TAKE A BREAK" "ONE SIP" UPRIGHT 90DEGREES   Medication Administration: Crushed with puree    Other  Recommendations Oral Care Recommendations: Oral care QID;Staff/trained caregiver to provide oral care Other Recommendations:  Order thickener from pharmacy;Prohibited food (jello, ice cream, thin soups);Remove water pitcher    Recommendations for follow up therapy are one component of a multi-disciplinary discharge planning process, led by the attending physician.  Recommendations may be updated based on patient status, additional functional criteria and insurance authorization.  Follow up Recommendations Skilled nursing-short term rehab (<3 hours/day)      Assistance Recommended at Discharge  Total Supervision  Functional Status Assessment Patient has had a recent decline in their functional status and/or demonstrates limited ability to make significant improvements in function in a reasonable and predictable amount of time  Frequency and Duration min 3x week  2 weeks       Prognosis Prognosis for Safe Diet Advancement: Guarded Barriers to Reach Goals: Cognitive deficits;Time post onset;Severity of deficits      Swallow Study   General Date of Onset: 10/24/22 Type of Study: Bedside Swallow Evaluation Previous Swallow Assessment:   MBSS last admission 09/25/2022 "Pt presents with moderate oropharyngeal dysphagia. Pharyngeal phase notable for reduced laryngeal elevation, tongue base retraction, epiglottic deflection and airway closure resulting in aspiration/penetration of liquids and pharyngeal residue. Transient penetration noted with tsp of thin/nectar thick liquids; however with cup/straw presentation, penetration during the swallow remained in laryngeal vestibule and extended below the trachea (  aspiration) after the swallow. Pt inconsistently responsive to penetration/aspiration, with cough not efficient for clearance. Cued throat clear/cough proved ineffective to reduce penetration. Further moderate vallecula and pyriform residue noted, requiring verbal cues for secondary swallow for clearance. Reduced UES relaxation contributed to residue at level of UES and pyriform sinus. Oral phase impacted by cognition  and limited dentition, leading to reduced awareness of bolus and slowed oral manipulation. Piecemeal deglutition and oral residue noted at completion of solid trials. Regular solids and sequential swallows not completed secondary to risk of aspiration and oral phase deficits. Pt severely distractible during evaluation, requiring max verbal cues provided for remaining still, redirection, and utilization of slow/small sips through out assessment.       Pt's current dysphagia is advanced since MBSS completed in 2015, suggesting an acute on chronic dysphagia.  Suspect that pt will NOT be compliant with thick liquids (which were previously recommended in 2015) and thick liquids does not eliminate the risk of penetration/aspiration. Further, pt has been grossly stable on thin liquids (per group home) prior to admission. Therefore, recommend continued thin liquids and Dys 2 with STRICT aspiration precautions and monitoring respiratory function. Aspiration precautions include SMALL sips, slow rate, minimizing distractions, and upright/alert for PO intake. Constant supervision for monitoring and compliance with aspiration precautions. Medications administered with puree. MD/RN in agreement with recommendations."   Diet Prior to this Study: Dysphagia 2 (chopped);Thin liquids Temperature Spikes Noted: Yes Respiratory Status: Room air History of Recent Intubation: No Behavior/Cognition: Alert;Cooperative;Pleasant mood Oral Cavity Assessment: Within Functional Limits Oral Care Completed by SLP: Recent completion by staff Oral Cavity - Dentition: Edentulous Vision: Functional for self-feeding Self-Feeding Abilities: Able to feed self Patient Positioning: Upright in bed Baseline Vocal Quality: Normal Volitional Cough: Weak Volitional Swallow: Unable to elicit    Oral/Motor/Sensory Function Overall Oral Motor/Sensory Function: Within functional limits   Ice Chips   NT   Thin Liquid Other Comments: NT given  known aspiration on recent MBSS    Nectar Thick Other Comments: NT given known aspiration on recent MBSS   Honey Thick Honey Thick Liquid: Within functional limits Presentation: Cup;Spoon;Self fed   Puree Puree: Within functional limits Presentation: Spoon   Solid     Solid: Impaired Presentation: Spoon Oral Phase Impairments: Impaired mastication Oral Phase Functional Implications: Prolonged oral transit;Impaired mastication;Oral residue     [place], M.A. CCC-SLP   [place] L Arles Rumbold 10/27/2022,10:18 AM

## 2022-10-27 NOTE — Consult Note (Addendum)
[facility] Face-to-Face Psychiatry Consult   Reason for Consult:  confusion Referring Physician:  Agbata Patient Identification: Ian Medina MRN:  [id number] Principal Diagnosis: Acute metabolic encephalopathy Diagnosis:  Principal Problem:   Acute metabolic encephalopathy Active Problems:   Schizoaffective disorder, depressive type (HCC)   Dysphagia   Falls   Severe sepsis (HCC)   Cognitive decline   GERD (gastroesophageal reflux disease)   Urinary retention due to benign prostatic hyperplasia   Protein-calorie malnutrition, severe   Unintentional weight loss   Total Time spent with patient: 30 minutes  Subjective:   Ian Medina is a 66 y.o. male patient admitted with frequent falls and confusion.  Today, Ian Medina is lying in is bed and indicating that he would like some water. He appears more confused today than earlier this week and is unable to answer questions. His speech is hardly intelligible. He had refused to work with PT just prior to our visit.   HPI per Ian Medina on admission:   Patient has been to the ED twice prior for various medical problems. He has a Foley catheter in place at this time; was placed when he was here 2 days ago for urinary retention.   On approach, patient is able to tell Clinical research associate and student to "come on in." He is laying in bed is alert and oriented to self only. He is pleasant, smiles at appropriate times. When asked if he is feeling sad, he says, "I don't feel sassy, and smiles." Patient is not aggressive. He has periorbital bruising on right eye. Patient is difficult to understand; no teeth and no dentures. He is tangential when we are asking questions. He will give names of people and say they are coming to dinner. He says "I used to live in [place]; I am getting ready to move. He is able to respond to some questions that are not in the abstract.  He says he is happy. When I left the room he was able to respond that he wanted the light out.    Collateral obtained from group home owner, Ms. Jarvis Morgan, [phone]: Patient has been in the home since July 2023. He was falling when he first came to them, but it has gotten worse. She believes their is something medically wrong with him.  He has been in the hospital for UTI, pneumonia, urinary retention. He went to a SNF for a short time after hospitalization for pneumonia.  Chart review reveals he had been to the ED 5 times for falls, syncope, altered mental status since he was hospitalized  for pneumonia and sent to SNF for short stay. Patient has history of surgery for cerebral aneurysm, Hep C. She is worried about his urinary retention and weight loss. Writer also spoke with patient's guardian, [name] [phone]), who expressed the same concerns. Ms. Dellis Filbert asked for patient's PSA to be checked. Writer verbally conveyed Ms. Nines' request to Dr. Joylene Igo.   Past Psychiatric History: schizoaffective disorder, depressive type; cognitive decline  Risk to Self:  None Risk to Others:  None Prior Inpatient Therapy: None noted  Prior Outpatient Therapy:  None Noted  Past Medical History:  Past Medical History:  Diagnosis Date   Anxiety    Atherosclerosis of aorta (HCC)    Auditory hallucination    Cerebral aneurysm, nonruptured    COPD with emphysema (HCC)    Drug abuse (HCC)    Dysphagia, oropharyngeal phase    GAD (generalized anxiety disorder)    GERD (gastroesophageal reflux disease)  Hepatitis C    Hypercholesterolemia    Hypertension    Middle cerebral artery aneurysm    Mild intellectual disabilities    Paranoia (HCC)    Pneumonia 2017   Psoriasis    Psoriasis vulgaris    Repeated falls    Schizoaffective disorder, depressive type (HCC)    Schizophrenia (HCC)    diagnosed at age 26, treated by ACT team    Past Surgical History:  Procedure Laterality Date   CHEST TUBE INSERTION     COLONOSCOPY  Nov 2011   Dr. Samuella Cota: normal, internal hemorrhoids    COLONOSCOPY  Sept 2009   small internal hemorrhoids, normal    ESOPHAGOGASTRODUODENOSCOPY  June 2010   Dr. Samuella Cota: normal   ESOPHAGOGASTRODUODENOSCOPY  Jan 2009   Dr. Allena Katz: linear esophagitis, mild stricture, diffuse gastritis and mild duodenitis, PATH: chronic gastritis, negative H.pylori    FOOT SURGERY     right   Family History:  Family History  Problem Relation Age of Onset   COPD Father    Colon cancer Neg Hx    Family Psychiatric  History:  Social History:  Social History   Substance and Sexual Activity  Alcohol Use No     Social History   Substance and Sexual Activity  Drug Use Not Currently   Comment: IV drug use in remote past    Social History   Socioeconomic History   Marital status: Single    Spouse name: Not on file   Number of children: Not on file   Years of education: Not on file   Highest education level: Not on file  Occupational History   Not on file  Tobacco Use   Smoking status: Former    Packs/day: 1.00    Types: Cigarettes   Smokeless tobacco: Never  Vaping Use   Vaping Use: Never used  Substance and Sexual Activity   Alcohol use: No   Drug use: Not Currently    Comment: IV drug use in remote past   Sexual activity: Not on file  Other Topics Concern   Not on file  Social History Narrative   Not on file   Social Determinants of Health   Financial Resource Strain: Not on file  Food Insecurity: Not on file  Transportation Needs: Not on file  Physical Activity: Not on file  Stress: Not on file  Social Connections: Not on file   Additional Social History:    Allergies:   Allergies  Allergen Reactions   Propoxyphene Rash    Labs:  Results for orders placed or performed during the hospital encounter of 10/24/22 (from the past 48 hour(s))  Urine Culture     Status: Abnormal (Preliminary result)   Collection Time: 10/25/22  5:35 PM   Specimen: Urine, Catheterized  Result Value Ref Range   Specimen Description      URINE,  CATHETERIZED Performed at [facility], [address], [place], [place] [address]    Special Requests      NONE Performed at [facility], [address] [place], [place] [address]    Culture (A)     >=100,000 COLONIES/mL STAPHYLOCOCCUS AUREUS 60,000 COLONIES/mL ENTEROCOCCUS FAECALIS SUSCEPTIBILITIES TO FOLLOW Performed at [facility], [address], [place], [place] [address]    Report Status PENDING   Basic metabolic panel     Status: Abnormal   Collection Time: 10/26/22  4:30 AM  Result Value Ref Range   Sodium 140 135 - 145 mmol/L  Potassium 3.3 (L) 3.5 - 5.1 mmol/L   Chloride 106 98 - 111 mmol/L   CO2 29 22 - 32 mmol/L   Glucose, Bld 130 (H) 70 - 99 mg/dL    Comment: Glucose reference range applies only to samples taken after fasting for at least 8 hours.   BUN 10 8 - 23 mg/dL   Creatinine, Ser 4.130.71 0.61 - 1.24 mg/dL   Calcium 8.0 (L) 8.9 - 10.3 mg/dL   GFR, Estimated >24>60 >40>60 mL/min    Comment: (NOTE) Calculated using the CKD-EPI Creatinine Equation (2021)    Anion gap 5 5 - 15    Comment: Performed at [facility], [address] [place], [place] [address]  Magnesium     Status: None   Collection Time: 10/26/22  4:30 AM  Result Value Ref Range   Magnesium 2.0 1.7 - 2.4 mg/dL    Comment: Performed at [facility], [address] [place], [place] [address]  CBC     Status: Abnormal   Collection Time: 10/26/22  4:30 AM  Result Value Ref Range   WBC 10.6 (H) 4.0 - 10.5 K/uL   RBC 3.40 (L) 4.22 - 5.81 MIL/uL   Hemoglobin 10.6 (L) 13.0 - 17.0 g/dL   HCT 40.332.8 (L) 47.439.0 - 25.952.0 %   MCV 96.5 80.0 - 100.0 fL   MCH 31.2 26.0 - 34.0 pg   MCHC 32.3 30.0 - 36.0 g/dL   RDW 56.315.9 (H) 87.511.5 - 64.315.5 %   Platelets 244 150 - 400 K/uL   nRBC 0.0 0.0 - 0.2 %    Comment: Performed at [facility], [address] [place], [place] [address]  Culture, blood (single) w Reflex to ID Panel     Status: None (Preliminary result)    Collection Time: 10/26/22  3:26 PM   Specimen: BLOOD  Result Value Ref Range   Specimen Description BLOOD RIGHT ANTECUBITAL    Special Requests      BOTTLES DRAWN AEROBIC AND ANAEROBIC Blood Culture adequate volume   Culture      NO GROWTH < 24 HOURS Performed at [facility], [address] [place], [place] [address]    Report Status PENDING   Lactic acid, plasma     Status: None   Collection Time: 10/26/22  6:22 PM  Result Value Ref Range   Lactic Acid, Venous 1.5 0.5 - 1.9 mmol/L    Comment: Performed at [facility], [address] [place], [place] [address]  Procalcitonin     Status: None   Collection Time: 10/26/22  6:22 PM  Result Value Ref Range   Procalcitonin 0.52 ng/mL    Comment:        Interpretation: PCT > 0.5 ng/mL and <= 2 ng/mL: Systemic infection (sepsis) is possible, but other conditions are known to elevate PCT as well. (NOTE)       Sepsis PCT Algorithm           Lower Respiratory Tract                                      Infection PCT Algorithm    ----------------------------     ----------------------------         PCT < 0.25 ng/mL                PCT < 0.10 ng/mL  Strongly encourage             Strongly discourage   discontinuation of antibiotics    initiation of antibiotics    ----------------------------     -----------------------------       PCT 0.25 - 0.50 ng/mL            PCT 0.10 - 0.25 ng/mL               OR       >80% decrease in PCT            Discourage initiation of                                            antibiotics      Encourage discontinuation           of antibiotics    ----------------------------     -----------------------------         PCT >= 0.50 ng/mL              PCT 0.26 - 0.50 ng/mL                AND       <80% decrease in PCT             Encourage initiation of                                             antibiotics       Encourage continuation           of antibiotics     ----------------------------     -----------------------------        PCT >= 0.50 ng/mL                  PCT > 0.50 ng/mL               AND         increase in PCT                  Strongly encourage                                      initiation of antibiotics    Strongly encourage escalation           of antibiotics                                     -----------------------------                                           PCT <= 0.25 ng/mL                                                 OR                                        >  80% decrease in PCT                                      Discontinue / Do not initiate                                             antibiotics  Performed at [facility], [address] [place], [place] [address]   CK     Status: Abnormal   Collection Time: 10/26/22  6:22 PM  Result Value Ref Range   Total CK 25 (L) 49 - 397 U/L    Comment: Performed at [facility], [address] [place], [place] [address]  Comprehensive metabolic panel     Status: Abnormal   Collection Time: 10/26/22  6:22 PM  Result Value Ref Range   Sodium 135 135 - 145 mmol/L   Potassium 4.1 3.5 - 5.1 mmol/L   Chloride 102 98 - 111 mmol/L   CO2 26 22 - 32 mmol/L   Glucose, Bld 146 (H) 70 - 99 mg/dL    Comment: Glucose reference range applies only to samples taken after fasting for at least 8 hours.   BUN 14 8 - 23 mg/dL   Creatinine, Ser 8.20 0.61 - 1.24 mg/dL   Calcium 8.2 (L) 8.9 - 10.3 mg/dL   Total Protein 5.6 (L) 6.5 - 8.1 g/dL   Albumin 2.4 (L) 3.5 - 5.0 g/dL   AST 17 15 - 41 U/L   ALT 10 0 - 44 U/L   Alkaline Phosphatase 68 38 - 126 U/L   Total Bilirubin 1.3 (H) 0.3 - 1.2 mg/dL   GFR, Estimated >81 >38 mL/min    Comment: (NOTE) Calculated using the CKD-EPI Creatinine Equation (2021)    Anion gap 7 5 - 15    Comment: Performed at [facility], [address] [place], [place] [address]  CBC with Differential/Platelet     Status:  Abnormal   Collection Time: 10/26/22  6:22 PM  Result Value Ref Range   WBC 9.7 4.0 - 10.5 K/uL   RBC 3.23 (L) 4.22 - 5.81 MIL/uL   Hemoglobin 9.9 (L) 13.0 - 17.0 g/dL   HCT 97.4 (L) 71.8 - 55.0 %   MCV 96.6 80.0 - 100.0 fL   MCH 30.7 26.0 - 34.0 pg   MCHC 31.7 30.0 - 36.0 g/dL   RDW 15.8 (H) 68.2 - 57.4 %   Platelets 229 150 - 400 K/uL   nRBC 0.0 0.0 - 0.2 %   Neutrophils Relative % 71 %   Neutro Abs 6.8 1.7 - 7.7 K/uL   Lymphocytes Relative 22 %   Lymphs Abs 2.2 0.7 - 4.0 K/uL   Monocytes Relative 7 %   Monocytes Absolute 0.7 0.1 - 1.0 K/uL   Eosinophils Relative 0 %   Eosinophils Absolute 0.0 0.0 - 0.5 K/uL   Basophils Relative 0 %   Basophils Absolute 0.0 0.0 - 0.1 K/uL   Immature Granulocytes 0 %   Abs Immature Granulocytes 0.02 0.00 - 0.07 K/uL    Comment: Performed at [facility], [address] [place], [place] [address]  Lactic acid, plasma     Status: None   Collection Time: 10/26/22  9:33 PM  Result Value Ref Range  Lactic Acid, Venous 1.3 0.5 - 1.9 mmol/L    Comment: Performed at [facility], [address] [place], [place] [address]    Current Facility-Administered Medications  Medication Dose Route Frequency Provider Last Rate Last Admin   acetaminophen (TYLENOL) tablet 650 mg  650 mg Oral Q6H PRN Agbata, Tochukwu, MD   650 mg at 10/26/22 1632   Or   acetaminophen (TYLENOL) suppository 650 mg  650 mg Rectal Q6H PRN Agbata, Tochukwu, MD       benztropine (COGENTIN) tablet 0.5 mg  0.5 mg Oral TID Ian Medina F, NP   0.5 mg at 10/27/22 1002   ceFEPIme (MAXIPIME) 2 g in sodium chloride 0.9 % 100 mL IVPB  2 g Intravenous Q8H Manfred Shirts, RPH 200 mL/hr at 10/27/22 1031 2 g at 10/27/22 1031   Chlorhexidine Gluconate Cloth 2 % PADS 6 each  6 each Topical Daily Andris Baumann, MD   6 each at 10/27/22 1108   chlorproMAZINE (THORAZINE) tablet 25 mg  25 mg Oral Daily Ian Medina F, NP   25 mg at 10/27/22 1002   chlorproMAZINE  (THORAZINE) tablet 25 mg  25 mg Oral Once PRN Ian Medina F, NP       feeding supplement (ENSURE ENLIVE / ENSURE PLUS) liquid 237 mL  237 mL Oral TID BM Esaw Grandchild A, DO   237 mL at 10/27/22 1017   FLUoxetine (PROZAC) capsule 20 mg  20 mg Oral Daily Agbata, Tochukwu, MD   20 mg at 10/27/22 1001   fluticasone furoate-vilanterol (BREO ELLIPTA) 100-25 MCG/ACT 1 puff  1 puff Inhalation Daily Agbata, Tochukwu, MD   1 puff at 10/27/22 0855   And   umeclidinium bromide (INCRUSE ELLIPTA) 62.5 MCG/ACT 1 puff  1 puff Inhalation Daily Agbata, Tochukwu, MD   1 puff at 10/27/22 0856   gabapentin (NEURONTIN) capsule 100 mg  100 mg Oral TID Agbata, Tochukwu, MD   100 mg at 10/27/22 1001   haloperidol lactate (HALDOL) injection 1 mg  1 mg Intravenous Q6H PRN Jaynie Bream, RPH       lactated ringers infusion   Intravenous Continuous Esaw Grandchild A, DO 75 mL/hr at 10/27/22 1318 Rate Change at 10/27/22 1318   levETIRAcetam (KEPPRA) tablet 500 mg  500 mg Oral BID Agbata, Tochukwu, MD   500 mg at 10/27/22 1001   montelukast (SINGULAIR) tablet 10 mg  10 mg Oral QHS Agbata, Tochukwu, MD   10 mg at 10/25/22 2255   multivitamin with minerals tablet 1 tablet  1 tablet Oral Daily Esaw Grandchild A, DO   1 tablet at 10/27/22 1001   OLANZapine (ZYPREXA) tablet 20 mg  20 mg Oral QHS Agbata, Tochukwu, MD   20 mg at 10/25/22 2256   ondansetron (ZOFRAN) tablet 4 mg  4 mg Oral Q6H PRN Agbata, Tochukwu, MD       Or   ondansetron (ZOFRAN) injection 4 mg  4 mg Intravenous Q6H PRN Agbata, Tochukwu, MD       pantoprazole (PROTONIX) EC tablet 40 mg  40 mg Oral Daily Agbata, Tochukwu, MD   40 mg at 10/27/22 1001   pravastatin (PRAVACHOL) tablet 20 mg  20 mg Oral q1800 Agbata, Tochukwu, MD   20 mg at 10/25/22 1501   tamsulosin (FLOMAX) capsule 0.4 mg  0.4 mg Oral QPM Agbata, Tochukwu, MD   0.4 mg at 10/25/22 1501   traZODone (DESYREL) tablet 50 mg  50 mg Oral QHS Lucile Shutters, MD  50 mg at 10/25/22 2254    valbenazine (INGREZZA) capsule 40 mg  40 mg Oral Daily Ian Medina F, NP   40 mg at 10/27/22 1001    Musculoskeletal: Strength & Muscle Tone:  upper body appear wnl Gait & Station:  did not assess Patient leans: N/A   Psychiatric Specialty Exam: Physical Exam Vitals and nursing note reviewed.  HENT:     Head: Normocephalic.     Nose: No congestion or rhinorrhea.  Eyes:     General:        Right eye: No discharge (bruised around the eye).        Left eye: No discharge.  Cardiovascular:     Rate and Rhythm: Normal rate.  Pulmonary:     Effort: Pulmonary effort is normal.  Musculoskeletal:        General: Normal range of motion.  Skin:    General: Skin is dry.  Neurological:     Comments: Oriented to self  Psychiatric:        Attention and Perception: Attention normal.        Mood and Affect: Mood normal.        Speech: Speech normal.        Behavior: Behavior is cooperative.        Cognition and Memory: Memory is impaired. He exhibits impaired recent memory.        Judgment: Judgment normal.     Review of Systems  Constitutional:  Positive for weight loss.  HENT:         No complaints   Eyes: Negative.   Respiratory: Negative.    Genitourinary:        Has indwelling catheter   Musculoskeletal:  Positive for falls.  Skin:        Bruising right periorbital   Psychiatric/Behavioral:         Schizoaffective disorder     Blood pressure (!) 84/58, pulse 87, temperature 98.9 F (37.2 C), resp. rate 18, height 5\' 7"  (1.702 m), weight 68.6 kg, SpO2 93 %.Body mass index is 23.69 kg/m.  General Appearance: Disheveled  Eye Contact:  Poor  Speech:   Difficult to understand at times and minimal responses  Volume:  Normal  Mood:  Euthymic  Affect:  Congruent  Thought Process:  Unable to Assess, minimal responses  Orientation:  Unable to assess  Thought Content:  Logical  Suicidal Thoughts:  No  Homicidal Thoughts:  No  Memory:  Unable to assess  Judgement:   Fair  Insight:  Fair  Psychomotor Activity: Normal  Concentration:  Concentration: Fair and Attention Span: Fair  Recall:  Unable to of Knowledge:  Poor  Language:  Poor  Akathisia:  No  Handed:  Right  AIMS (if indicated):     Assets:  Leisure Time  ADL's:  Impaired  Cognition:  Impaired,  Moderate  Sleep:         Physical Exam: Physical Exam Vitals and nursing note reviewed.  HENT:     Head: Normocephalic.     Nose: No congestion or rhinorrhea.  Eyes:     General:        Right eye: No discharge (bruised around the eye).        Left eye: No discharge.  Cardiovascular:     Rate and Rhythm: Normal rate.  Pulmonary:     Effort: Pulmonary effort is normal.  Musculoskeletal:        General: Normal range of motion.  Skin:    General: Skin is dry.  Neurological:     Comments: Oriented to self  Psychiatric:        Attention and Perception: Attention normal.        Mood and Affect: Mood normal.        Speech: Speech normal.        Behavior: Behavior is cooperative.        Cognition and Memory: Memory is impaired. He exhibits impaired recent memory.        Judgment: Judgment normal.    Review of Systems  Constitutional:  Positive for weight loss.  HENT:         No complaints   Eyes: Negative.   Respiratory: Negative.    Genitourinary:        Has indwelling catheter   Musculoskeletal:  Positive for falls.  Skin:        Bruising right periorbital   Psychiatric/Behavioral:         Schizoaffective disorder    Blood pressure (!) 84/58, pulse 87, temperature 98.9 F (37.2 C), resp. rate 18, height  (1.702 m), weight 68.6 kg, SpO2 93 %. Body mass index is 23.69 kg/m.  Treatment Plan Summary: Schizoaffective disorder, bipolar type: Thorazine 25 mg daily Thorazine 25 mg oral or IM BID for agitation, Haldol discontinued  EPS: Cogentin 0.5 mg BID decreased from TID  Depression: Prozac 20 mg daily  Anxiety: Gabapentin 100 mg  TID  Insomnia: Changed Trazodone 50 mg daily at bedtime to PRN  TD: Ingrezza 40 mg daily  Disposition: No evidence of imminent risk to self or others at present.   Patient does not meet criteria for psychiatric inpatient admission.  Nanine Means, NP 10/27/2022 3:30 PM

## 2022-10-28 DIAGNOSIS — J690 Pneumonitis due to inhalation of food and vomit: Secondary | ICD-10-CM

## 2022-10-28 DIAGNOSIS — F250 Schizoaffective disorder, bipolar type: Secondary | ICD-10-CM | POA: Diagnosis not present

## 2022-10-28 DIAGNOSIS — N401 Enlarged prostate with lower urinary tract symptoms: Secondary | ICD-10-CM | POA: Diagnosis not present

## 2022-10-28 DIAGNOSIS — R338 Other retention of urine: Secondary | ICD-10-CM | POA: Diagnosis not present

## 2022-10-28 DIAGNOSIS — E876 Hypokalemia: Secondary | ICD-10-CM

## 2022-10-28 DIAGNOSIS — G9341 Metabolic encephalopathy: Secondary | ICD-10-CM | POA: Diagnosis not present

## 2022-10-28 LAB — BASIC METABOLIC PANEL
Anion gap: 5 (ref 5–15)
BUN: 11 mg/dL (ref 8–23)
CO2: 28 mmol/L (ref 22–32)
Calcium: 8.1 mg/dL — ABNORMAL LOW (ref 8.9–10.3)
Chloride: 107 mmol/L (ref 98–111)
Creatinine, Ser: 0.57 mg/dL — ABNORMAL LOW (ref 0.61–1.24)
GFR, Estimated: >60 mL/min (ref >60)
Glucose, Bld: 107 mg/dL — ABNORMAL HIGH (ref 70–99)
Potassium: 3.5 mmol/L (ref 3.5–5.1)
Sodium: 140 mmol/L (ref 135–145)

## 2022-10-28 LAB — URINE CULTURE: Culture: >=100000 — AB

## 2022-10-28 LAB — MAGNESIUM: Magnesium: 1.9 mg/dL (ref 1.7–2.4)

## 2022-10-28 LAB — PHOSPHORUS: Phosphorus: 2.7 mg/dL (ref 2.5–4.6)

## 2022-10-28 MED ORDER — SODIUM CHLORIDE 0.9 % IV SOLN
3.0000 g | Freq: Four times a day (QID) | INTRAVENOUS | Status: AC
  Administered 2022-10-28 – 2022-11-04 (×28): 3 g via INTRAVENOUS
  Filled 2022-10-28 (×2): qty 3
  Filled 2022-10-28: qty 8
  Filled 2022-10-28: qty 3
  Filled 2022-10-28: qty 8
  Filled 2022-10-28: qty 3
  Filled 2022-10-28: qty 8
  Filled 2022-10-28 (×3): qty 3
  Filled 2022-10-28 (×2): qty 8
  Filled 2022-10-28: qty 3
  Filled 2022-10-28 (×2): qty 8
  Filled 2022-10-28 (×4): qty 3
  Filled 2022-10-28 (×2): qty 8
  Filled 2022-10-28 (×4): qty 3
  Filled 2022-10-28: qty 8
  Filled 2022-10-28 (×2): qty 3
  Filled 2022-10-28 (×2): qty 8

## 2022-10-28 MED ORDER — POTASSIUM CHLORIDE 10 MEQ/100ML IV SOLN
10.0000 meq | INTRAVENOUS | Status: AC
  Administered 2022-10-28 (×3): 10 meq via INTRAVENOUS
  Filled 2022-10-28: qty 100

## 2022-10-28 NOTE — Consult Note (Signed)
[facility] Face-to-Face Psychiatry Consult   Reason for Consult:  confusion Referring Physician:  Agbata Patient Identification: Ian Medina MRN:  [id number] Principal Diagnosis: Acute metabolic encephalopathy Diagnosis:  Principal Problem:   Acute metabolic encephalopathy Active Problems:   Schizoaffective disorder, bipolar type (HCC)   Dysphagia   Falls   Cognitive decline   GERD (gastroesophageal reflux disease)   Urinary retention due to benign prostatic hyperplasia   Protein-calorie malnutrition, severe   Unintentional weight loss   Aspiration pneumonia (HCC)   Total Time spent with patient: 30 minutes  Subjective:   Ian Medina is a 66 y.o. male patient admitted with frequent falls and confusion.  "Hello honey, how are you."  The client is pleasant with no distress.  He is able to answer questions and denies suicidal ideations, depression, anxiety, and  psychosis.  Calm and cooperative on assessment with no concerns.  No confusion on assessment.  HPI per Gabriel Cirri on admission:   Patient has been to the ED twice prior for various medical problems. He has a Foley catheter in place at this time; was placed when he was here 2 days ago for urinary retention.   On approach, patient is able to tell Clinical research associate and student to "come on in." He is laying in bed is alert and oriented to self only. He is pleasant, smiles at appropriate times. When asked if he is feeling sad, he says, "I don't feel sassy, and smiles." Patient is not aggressive. He has periorbital bruising on right eye. Patient is difficult to understand; no teeth and no dentures. He is tangential when we are asking questions. He will give names of people and say they are coming to dinner. He says "I used to live in [place]; I am getting ready to move. He is able to respond to some questions that are not in the abstract.  He says he is happy. When I left the room he was able to respond that he wanted the light out.    Collateral obtained from group home owner, Ms. Jarvis Morgan, [phone]: Patient has been in the home since July 2023. He was falling when he first came to them, but it has gotten worse. She believes their is something medically wrong with him.  He has been in the hospital for UTI, pneumonia, urinary retention. He went to a SNF for a short time after hospitalization for pneumonia.  Chart review reveals he had been to the ED 5 times for falls, syncope, altered mental status since he was hospitalized  for pneumonia and sent to SNF for short stay. Patient has history of surgery for cerebral aneurysm, Hep C. She is worried about his urinary retention and weight loss. Writer also spoke with patient's guardian, [name] [phone]), who expressed the same concerns. Ms. Dellis Filbert asked for patient's PSA to be checked. Writer verbally conveyed Ms. Nines' request to Dr. Joylene Igo.   Past Psychiatric History: schizoaffective disorder, depressive type; cognitive decline  Risk to Self:  None Risk to Others:  None Prior Inpatient Therapy: None noted  Prior Outpatient Therapy:  None Noted  Past Medical History:  Past Medical History:  Diagnosis Date   Anxiety    Atherosclerosis of aorta (HCC)    Auditory hallucination    Cerebral aneurysm, nonruptured    COPD with emphysema (HCC)    Drug abuse (HCC)    Dysphagia, oropharyngeal phase    GAD (generalized anxiety disorder)    GERD (gastroesophageal reflux disease)    Hepatitis C  Hypercholesterolemia    Hypertension    Middle cerebral artery aneurysm    Mild intellectual disabilities    Paranoia ([place])    Pneumonia 2017   Psoriasis    Psoriasis vulgaris    Repeated falls    Schizoaffective disorder, depressive type ([place])    Schizophrenia ([place])    diagnosed at age 11, treated by ACT team    Past Surgical History:  Procedure Laterality Date   CHEST TUBE INSERTION     COLONOSCOPY  Nov 2011   Dr. Earley Brooke: normal, internal hemorrhoids    COLONOSCOPY  Sept 2009   small internal hemorrhoids, normal    ESOPHAGOGASTRODUODENOSCOPY  June 2010   Dr. Earley Brooke: normal   ESOPHAGOGASTRODUODENOSCOPY  Jan 2009   Dr. Posey Pronto: linear esophagitis, mild stricture, diffuse gastritis and mild duodenitis, PATH: chronic gastritis, negative H.pylori    FOOT SURGERY     right   Family History:  Family History  Problem Relation Age of Onset   COPD Father    Colon cancer Neg Hx    Family Psychiatric  History:  Social History:  Social History   Substance and Sexual Activity  Alcohol Use No     Social History   Substance and Sexual Activity  Drug Use Not Currently   Comment: IV drug use in remote past    Social History   Socioeconomic History   Marital status: Single    Spouse name: Not on file   Number of children: Not on file   Years of education: Not on file   Highest education level: Not on file  Occupational History   Not on file  Tobacco Use   Smoking status: Former    Packs/day: 1.00    Types: Cigarettes   Smokeless tobacco: Never  Vaping Use   Vaping Use: Never used  Substance and Sexual Activity   Alcohol use: No   Drug use: Not Currently    Comment: IV drug use in remote past   Sexual activity: Not on file  Other Topics Concern   Not on file  Social History Narrative   Not on file   Social Determinants of Health   Financial Resource Strain: Not on file  Food Insecurity: Not on file  Transportation Needs: Not on file  Physical Activity: Not on file  Stress: Not on file  Social Connections: Not on file   Additional Social History:    Allergies:   Allergies  Allergen Reactions   Propoxyphene Rash    Labs:  Results for orders placed or performed during the hospital encounter of 10/24/22 (from the past 48 hour(s))  Culture, blood (single) w Reflex to ID Panel     Status: None (Preliminary result)   Collection Time: 10/26/22  3:26 PM   Specimen: BLOOD  Result Value Ref Range   Specimen Description  BLOOD RIGHT ANTECUBITAL    Special Requests      BOTTLES DRAWN AEROBIC AND ANAEROBIC Blood Culture adequate volume   Culture      NO GROWTH 2 DAYS Performed at [facility], [address] [place], [place] [address]    Report Status PENDING   Lactic acid, plasma     Status: None   Collection Time: 10/26/22  6:22 PM  Result Value Ref Range   Lactic Acid, Venous 1.5 0.5 - 1.9 mmol/L    Comment: Performed at [facility], [address] [place],  [address]  Procalcitonin     Status: None   Collection  Time: 10/26/22  6:22 PM  Result Value Ref Range   Procalcitonin 0.52 ng/mL    Comment:        Interpretation: PCT > 0.5 ng/mL and <= 2 ng/mL: Systemic infection (sepsis) is possible, but other conditions are known to elevate PCT as well. (NOTE)       Sepsis PCT Algorithm           Lower Respiratory Tract                                      Infection PCT Algorithm    ----------------------------     ----------------------------         PCT < 0.25 ng/mL                PCT < 0.10 ng/mL          Strongly encourage             Strongly discourage   discontinuation of antibiotics    initiation of antibiotics    ----------------------------     -----------------------------       PCT 0.25 - 0.50 ng/mL            PCT 0.10 - 0.25 ng/mL               OR       >80% decrease in PCT            Discourage initiation of                                            antibiotics      Encourage discontinuation           of antibiotics    ----------------------------     -----------------------------         PCT >= 0.50 ng/mL              PCT 0.26 - 0.50 ng/mL                AND       <80% decrease in PCT             Encourage initiation of                                             antibiotics       Encourage continuation           of antibiotics    ----------------------------     -----------------------------        PCT >= 0.50 ng/mL                  PCT > 0.50  ng/mL               AND         increase in PCT                  Strongly encourage                                      initiation of antibiotics  Strongly encourage escalation           of antibiotics                                     -----------------------------                                           PCT <= 0.25 ng/mL                                                 OR                                        > 80% decrease in PCT                                      Discontinue / Do not initiate                                             antibiotics  Performed at [facility], [address] [place], [place] [address]   CK     Status: Abnormal   Collection Time: 10/26/22  6:22 PM  Result Value Ref Range   Total CK 25 (L) 49 - 397 U/L    Comment: Performed at [facility], [address] [place], [place] [address]  Comprehensive metabolic panel     Status: Abnormal   Collection Time: 10/26/22  6:22 PM  Result Value Ref Range   Sodium 135 135 - 145 mmol/L   Potassium 4.1 3.5 - 5.1 mmol/L   Chloride 102 98 - 111 mmol/L   CO2 26 22 - 32 mmol/L   Glucose, Bld 146 (H) 70 - 99 mg/dL    Comment: Glucose reference range applies only to samples taken after fasting for at least 8 hours.   BUN 14 8 - 23 mg/dL   Creatinine, Ser 0.81 0.61 - 1.24 mg/dL   Calcium 8.2 (L) 8.9 - 10.3 mg/dL   Total Protein 5.6 (L) 6.5 - 8.1 g/dL   Albumin 2.4 (L) 3.5 - 5.0 g/dL   AST 17 15 - 41 U/L   ALT 10 0 - 44 U/L   Alkaline Phosphatase 68 38 - 126 U/L   Total Bilirubin 1.3 (H) 0.3 - 1.2 mg/dL   GFR, Estimated >60 >60 mL/min    Comment: (NOTE) Calculated using the CKD-EPI Creatinine Equation (2021)    Anion gap 7 5 - 15    Comment: Performed at [facility], [address], [place], [place] [address]  CBC with Differential/Platelet     Status: Abnormal   Collection Time: 10/26/22  6:22 PM  Result Value Ref Range   WBC 9.7 4.0 - 10.5 K/uL   RBC 3.23 (L) 4.22  - 5.81 MIL/uL   Hemoglobin 9.9 (L) 13.0 - 17.0 g/dL   HCT  31.2 (L) 39.0 - 52.0 %   MCV 96.6 80.0 - 100.0 fL   MCH 30.7 26.0 - 34.0 pg   MCHC 31.7 30.0 - 36.0 g/dL   RDW 15.9 (H) 11.5 - 15.5 %   Platelets 229 150 - 400 K/uL   nRBC 0.0 0.0 - 0.2 %   Neutrophils Relative % 71 %   Neutro Abs 6.8 1.7 - 7.7 K/uL   Lymphocytes Relative 22 %   Lymphs Abs 2.2 0.7 - 4.0 K/uL   Monocytes Relative 7 %   Monocytes Absolute 0.7 0.1 - 1.0 K/uL   Eosinophils Relative 0 %   Eosinophils Absolute 0.0 0.0 - 0.5 K/uL   Basophils Relative 0 %   Basophils Absolute 0.0 0.0 - 0.1 K/uL   Immature Granulocytes 0 %   Abs Immature Granulocytes 0.02 0.00 - 0.07 K/uL    Comment: Performed at [facility], [address] [place], [place] [address]  Lactic acid, plasma     Status: None   Collection Time: 10/26/22  9:33 PM  Result Value Ref Range   Lactic Acid, Venous 1.3 0.5 - 1.9 mmol/L    Comment: Performed at [facility], [address] [place], [place] [id number]  Basic metabolic panel     Status: Abnormal   Collection Time: 10/28/22  5:18 AM  Result Value Ref Range   Sodium 140 135 - 145 mmol/L   Potassium 3.5 3.5 - 5.1 mmol/L   Chloride 107 98 - 111 mmol/L   CO2 28 22 - 32 mmol/L   Glucose, Bld 107 (H) 70 - 99 mg/dL    Comment: Glucose reference range applies only to samples taken after fasting for at least 8 hours.   BUN 11 8 - 23 mg/dL   Creatinine, Ser 0.57 (L) 0.61 - 1.24 mg/dL   Calcium 8.1 (L) 8.9 - 10.3 mg/dL   GFR, Estimated >60 >60 mL/min    Comment: (NOTE) Calculated using the CKD-EPI Creatinine Equation (2021)    Anion gap 5 5 - 15    Comment: Performed at [facility], [address] [place], [place] [address]  Magnesium     Status: None   Collection Time: 10/28/22  5:18 AM  Result Value Ref Range   Magnesium 1.9 1.7 - 2.4 mg/dL    Comment: Performed at [facility], [address], [place], [place] [address]  Phosphorus     Status: None    Collection Time: 10/28/22  5:18 AM  Result Value Ref Range   Phosphorus 2.7 2.5 - 4.6 mg/dL    Comment: Performed at [facility], [address] [place], [place] [address]    Current Facility-Administered Medications  Medication Dose Route Frequency Provider Last Rate Last Admin   acetaminophen (TYLENOL) tablet 650 mg  650 mg Oral Q6H PRN Agbata, Tochukwu, MD   650 mg at 10/26/22 1632   Or   acetaminophen (TYLENOL) suppository 650 mg  650 mg Rectal Q6H PRN Agbata, Tochukwu, MD       Ampicillin-Sulbactam (UNASYN) 3 g in sodium chloride 0.9 % 100 mL IVPB  3 g Intravenous Q6H Griffith, Kelly A, DO       benztropine (COGENTIN) tablet 0.5 mg  0.5 mg Oral BID Patrecia Pour, NP   0.5 mg at 10/28/22 1009   Chlorhexidine Gluconate Cloth 2 % PADS 6 each  6 each Topical Daily Athena Masse, MD   6 each at 10/28/22 1014   chlorproMAZINE (THORAZINE)  tablet 25 mg  25 mg Oral Daily PRN Charm Rings, NP       Or   chlorproMAZINE (THORAZINE) injection 25 mg  25 mg Intramuscular Daily PRN Charm Rings, NP       chlorproMAZINE (THORAZINE) tablet 25 mg  25 mg Oral Daily Gabriel Cirri F, NP   25 mg at 10/28/22 1014   feeding supplement (ENSURE ENLIVE / ENSURE PLUS) liquid 237 mL  237 mL Oral TID BM Esaw Grandchild A, DO   237 mL at 10/28/22 1014   FLUoxetine (PROZAC) capsule 20 mg  20 mg Oral Daily Agbata, Tochukwu, MD   20 mg at 10/28/22 1009   fluticasone furoate-vilanterol (BREO ELLIPTA) 100-25 MCG/ACT 1 puff  1 puff Inhalation Daily Agbata, Tochukwu, MD   1 puff at 10/28/22 1011   And   umeclidinium bromide (INCRUSE ELLIPTA) 62.5 MCG/ACT 1 puff  1 puff Inhalation Daily Agbata, Tochukwu, MD   1 puff at 10/28/22 1011   gabapentin (NEURONTIN) capsule 100 mg  100 mg Oral TID Agbata, Tochukwu, MD   100 mg at 10/28/22 1009   lactated ringers infusion   Intravenous Continuous Esaw Grandchild A, DO 75 mL/hr at 10/28/22 0600 New Bag at 10/28/22 0600   levETIRAcetam (KEPPRA) tablet 500 mg   500 mg Oral BID Agbata, Tochukwu, MD   500 mg at 10/28/22 1008   montelukast (SINGULAIR) tablet 10 mg  10 mg Oral QHS Agbata, Tochukwu, MD   10 mg at 10/27/22 2149   multivitamin with minerals tablet 1 tablet  1 tablet Oral Daily Esaw Grandchild A, DO   1 tablet at 10/28/22 1009   OLANZapine (ZYPREXA) tablet 20 mg  20 mg Oral QHS Agbata, Tochukwu, MD   20 mg at 10/27/22 2149   ondansetron (ZOFRAN) tablet 4 mg  4 mg Oral Q6H PRN Agbata, Tochukwu, MD       Or   ondansetron (ZOFRAN) injection 4 mg  4 mg Intravenous Q6H PRN Agbata, Tochukwu, MD       pantoprazole (PROTONIX) EC tablet 40 mg  40 mg Oral Daily Agbata, Tochukwu, MD   40 mg at 10/28/22 1008   potassium chloride 10 mEq in 100 mL IVPB  10 mEq Intravenous Q1 Hr x 3 Esaw Grandchild A, DO 100 mL/hr at 10/28/22 1229 10 mEq at 10/28/22 1229   pravastatin (PRAVACHOL) tablet 20 mg  20 mg Oral q1800 Agbata, Tochukwu, MD   20 mg at 10/27/22 1713   tamsulosin (FLOMAX) capsule 0.4 mg  0.4 mg Oral QPM Agbata, Tochukwu, MD   0.4 mg at 10/27/22 1713   traZODone (DESYREL) tablet 50 mg  50 mg Oral QHS PRN Charm Rings, NP       valbenazine [facility]) capsule 40 mg  40 mg Oral Daily Gabriel Cirri F, NP   40 mg at 10/28/22 1009    Musculoskeletal: Strength & Muscle Tone:  upper body appear wnl Gait & Station:  did not assess Patient leans: N/A   Psychiatric Specialty Exam: Physical Exam Vitals and nursing note reviewed.  HENT:     Head: Normocephalic.     Nose: No congestion or rhinorrhea.  Eyes:     General:        Right eye: No discharge (bruised around the eye).        Left eye: No discharge.  Cardiovascular:     Rate and Rhythm: Normal rate.  Pulmonary:     Effort: Pulmonary effort is normal.  Musculoskeletal:  General: Normal range of motion.  Skin:    General: Skin is dry.  Neurological:     Comments: Oriented to self  Psychiatric:        Attention and Perception: Attention normal.        Mood and Affect: Mood normal.         Speech: Speech normal.        Behavior: Behavior is cooperative.        Cognition and Memory: Memory is impaired. He exhibits impaired recent memory.        Judgment: Judgment normal.     Review of Systems  Constitutional:  Positive for weight loss.  HENT:         No complaints   Eyes: Negative.   Respiratory: Negative.    Genitourinary:        Has indwelling catheter   Musculoskeletal:  Positive for falls.  Skin:        Bruising right periorbital   Psychiatric/Behavioral:         Schizoaffective disorder     Blood pressure 96/72, pulse (!) 114, temperature 98 F (36.7 C), resp. rate 18, height 5\' 7"  (1.702 m), weight 68.6 kg, SpO2 93 %.Body mass index is 23.69 kg/m.  General Appearance: Casual  Eye Contact:  Good  Speech:  WDL  Volume:  Normal  Mood:  Euthymic  Affect:  Congruent  Thought Process:  WDL  Orientation:  to self  Thought Content:  Logical  Suicidal Thoughts:  No  Homicidal Thoughts:  No  Memory:  Fair  Judgement:  Fair  Insight:  Fair  Psychomotor Activity: Normal  Concentration:  Concentration: Fair and Attention Span: Fair  Recall:  [employer] of Knowledge:  Fair  Language:  Fair  Akathisia:  No  Handed:  Right  AIMS (if indicated):     Assets:  Leisure Time  ADL's:  Impaired  Cognition:  Impaired,  Moderate  Sleep:         Physical Exam: Physical Exam Vitals and nursing note reviewed.  HENT:     Head: Normocephalic.     Nose: No congestion or rhinorrhea.  Eyes:     General:        Right eye: No discharge (bruised around the eye).        Left eye: No discharge.  Cardiovascular:     Rate and Rhythm: Normal rate.  Pulmonary:     Effort: Pulmonary effort is normal.  Musculoskeletal:        General: Normal range of motion.  Skin:    General: Skin is dry.  Neurological:     Comments: Oriented to self  Psychiatric:        Attention and Perception: Attention normal.        Mood and Affect: Mood normal.        Speech:  Speech normal.        Behavior: Behavior is cooperative.        Cognition and Memory: Memory is impaired. He exhibits impaired recent memory.        Judgment: Judgment normal.    Review of Systems  Constitutional:  Positive for weight loss.  HENT:         No complaints   Eyes: Negative.   Respiratory: Negative.    Genitourinary:        Has indwelling catheter   Musculoskeletal:  Positive for falls.  Skin:        Bruising right periorbital  Psychiatric/Behavioral:         Schizoaffective disorder    Blood pressure 96/72, pulse (!) 114, temperature 98 F (36.7 C), resp. rate 18, height 5\' 7"  (1.702 m), weight 68.6 kg, SpO2 93 %. Body mass index is 23.69 kg/m.  Treatment Plan Summary: Schizoaffective disorder, bipolar type: Thorazine 25 mg daily Thorazine 25 mg oral or IM BID for agitation, Haldol discontinued  EPS: Cogentin 0.5 mg BID decreased from TID  Depression: Prozac 20 mg daily  Anxiety: Gabapentin 100 mg TID  Insomnia: Changed Trazodone 50 mg daily at bedtime to PRN  TD: Ingrezza 40 mg daily  Disposition: No evidence of imminent risk to self or others at present.   Patient does not meet criteria for psychiatric inpatient admission.  Waylan Boga, NP 10/28/2022 1:18 PM

## 2022-10-28 NOTE — Assessment & Plan Note (Signed)
K 3.3 on 11/30, replaced. --will give 3 runs IV K-Cl with fluids today for K 3.5 --Monitor BMP

## 2022-10-28 NOTE — Progress Notes (Signed)
Speech Language Pathology Treatment: Dysphagia  Patient Details Name: Ian Medina MRN: [id number] DOB: October 05, 1956 Today's Date: 10/28/2022 Time: 1216-2446 SLP Time Calculation (min) (ACUTE ONLY): 12 min  Assessment / Plan / Recommendation Clinical Impression  Pt seen for ongoing dysphagia management. Pt was laying in bed with the blanket pulled over his head. Pt remained blanket and was eager to interact with this Probation officer after being spoke to. SLP provided skilled observation of pt consuming puree snack and honey thick liquids via cup sips. During today's session, pt consumed without any overt s/s of aspiration. Pt didn't complain about texture or thickness of liquid. Given previously documented aspiration of nectar thick liquids, recommend pt continue consuming dysphagia 1 with honey thick liquids, medicine crushed in puree. Education provided that pt can come back as an Outpatient for an instrumental swallow study should he have any overt s/s of aspiration.   At this time, ST services will sign off. Pt's attending (Dr Ian Medina) aware of the above recommendations.    HPI  Ian Medina is a 66 y.o. male with medical history significant for schizoaffective disorder, mild intellectual disability, nicotine dependence, COPD, generalized anxiety disorder , hypertension, hepatitis C who was brought into the ER by EMS for evaluation of frequent falls, worsening cognitive decline and decrease in his ability to carry out his ADL's.  Patient has been seen twice in the emergency room for falls.  Had a CT  scan of the head and C-spine/face which did not show any acute pathology 2 days prior to this admission.  He was noted to have urinary retention during that visit and had a Foley catheter placed. During my evaluation he is unable to provide any history and appears very confused. He has ecchymoses involving his right eye. Called and spoke to patient's legal guardian Ian Medina in detail and she is concerned about  patient's progressive decline which she describes as worsening confusion, frequent falls and inability to carry out his activities of daily living. Patient was recently hospitalized from 09/24/22  through 09/28/22 for a similar presentation and at that time was found to have pneumonia and multiple rib fractures.  Per documentation his mental status improved and he was discharged to a SNF.   RRT called 11/30 Overnight. Vital signs as noted . New IV started x2 , LR bolus as noted on MAR. Little improvement of BP. New orders pending for transfer to [place] is very lethargic, responds to pain. Pt admitted for AMS. Temp 103.1. BP 82/57 with a MAP of 66.  Primary RN starting 2nd IV.  O2 sats stable on 2L [place]. Pt has legal gaurdian. BP improved after fluid bolus and pt is more alert. Dr. Arbutus Medina notified of current BP and canceled transfer order. Pt to stay on 1C for now per Dr. Arbutus Medina. BP 105/72 with MAP of 83.   Clinical Indicators WBC: 9.7 Temp: Fever overnight 103 Lung Status: [place] 3L  CXR 11/30 Chronic emphysematous changes with chronic scarring within the lateral right mid lung. Increased opacification in this region compared to the most recent 10/24/2022 radiograph, with the current appearance more similar to pneumonia seen on prior 10/01/2022 and 09/24/2022 radiographs. This is favored to represent recurrence of pneumonia in this region. CXR 11/28 Incomplete resolution of airspace disease in the right lung, recommend continued follow-up to clearing. Head CT: No acute or interval finding.          SLP Plan  All goals met      Recommendations for follow up therapy are  one component of a multi-disciplinary discharge planning process, led by the attending physician.  Recommendations may be updated based on patient status, additional functional criteria and insurance authorization.    Recommendations  Diet recommendations: Dysphagia 1 (puree);Honey-thick liquid Liquids provided via:  Cup Medication Administration: Crushed with puree Supervision: Patient able to self feed;Staff to assist with self feeding;Intermittent supervision to cue for compensatory strategies Compensations: Minimize environmental distractions;Slow rate;Small sips/bites                Oral Care Recommendations: Oral care BID Follow Up Recommendations: Skilled nursing-short term rehab (<3 hours/day) Assistance recommended at discharge: Frequent or constant Supervision/Assistance SLP Visit Diagnosis: Dysphagia, oropharyngeal phase (R13.12) Plan: All goals met         Ian Medina B. Rutherford Nail, M.S., CCC-SLP, Mining engineer Certified Brain Injury [place]  [place] Office [phone] Ascom [phone] Fax [phone]

## 2022-10-28 NOTE — Assessment & Plan Note (Signed)
Patient is on multiple antipsychotic agents which include Cogentin, Thorazine, Prozac, Zyprexa, trazodone and Ingrezza 11/29 -- persistent agitation despite Haldol.  IM Geodon ordered and psych was updated. --Psych consulted on admission --Meds continued --PRN IM Haldol for agitation --Thorazine PRN at beginning of agitation 

## 2022-10-28 NOTE — Progress Notes (Signed)
Progress Note   Patient: Ian Medina VVO:[id number] DOB: Jun 13, 1956 DOA: 10/24/2022     4 DOS: the patient was seen and examined on 10/28/2022   Brief hospital course: Ian Medina is a 66 y.o. male with medical history significant for schizoaffective disorder, mild intellectual disability, nicotine dependence, COPD, generalized anxiety disorder , hypertension, hepatitis C, group home resident with guardians through DSS.  He was brought into the ED on 10/24/2022 for evaluation of frequent falls, worsening cognitive decline and decrease in his ability to carry out his ADL's.  He had been seen in the ED twice recently for falls.  CT's of head, neck and face showed only a R zygomatic fracture.  He was found to have acute urinary retention at prior ED visit, a Foley catheter was placed.  Patient's legal guardian Ian Medina reports ongoing concern about patient's progressive decline which she describes as worsening confusion, frequent falls and inability to carry out his activities of daily living.  She also notes significant weight loss recently.   Patient was recently hospitalized from 09/24/22  through 09/28/22 for a similar presentation and at that time was found to have pneumonia and multiple rib fractures.  Per documentation his mental status improved and he was discharged to a SNF.  Pt admitted to the hospital for further evaluation and management.   11/30: afternoon pt febrile, tachycardic, meeting sepsis criteria. Code sepsis initiated.  Antibiotics started and further evaluation for source of infection underway.  UTI vs worsening pneumonia with probable aspiration.  Fluid resuscitation underway.  BP's low, but MAP so far holding > 65.  PCCM made aware, in case of need for pressors.   Assessment and Plan: * Acute metabolic encephalopathy Most likely related to worsening infection, progression of pneumonia likely aspiration, also with UTI.  Presented with progressively worsening mental status  changes - very confused and oriented only to person (per guardian states is unusual for him).  Concern for possible dementia in this patient who has CT scan findings suggestive of encephalomalacia in the left temporal lobe. Ammonia, B12, RPR level normal. UDS positive only for tricyclics. Covid negative. UA bloody, rare bacteria, unable to check nitrites or leukocytes.  Urine culture growing Staph aureus and Enterococcus faecalis CXR with incomplete resolution of airspace disease in the right lung (recently had PNA, currently without respiratory symptoms). --Follow cultures --Continue antibiotics as outlined --Psych consulted to review medications --Delirium precautions  Urinary retention due to benign prostatic hyperplasia Noted to have urinary retention during his last ER visit and had a Foley catheter placed. Presented this time with gross hematuria, suspect urethral trauma from insertion vs pt pulling on the Foley. PSA level is normal. 11/30: DC'd Foley for voiding trial 12/1: Appears ongoing retention on bladder scans, expect will need in/out cath --Started on Flomax --Consult to urology -- Hope to avoid Foley due to causing agitation  --Bladder scans to monitor for recurrent retention  Schizoaffective disorder, depressive type [facility]) Patient is on multiple antipsychotic agents which include Cogentin, Thorazine, Prozac, Zyprexa, trazodone and Ingrezza 11/29 -- persistent agitation despite Haldol.  IM Geodon ordered and psych was updated. --Psych consulted on admission --Meds continued --PRN IM Haldol for agitation --Thorazine PRN at beginning of agitation  Aspiration pneumonia (HCC) Recent admission for PNA felt due to aspiration.  Noted to have progression of chronic dysphagia by SLP.  Became septic again on 11/30 and CXR showed worsening right-sided infiltrates.   --See dysphagia --Continue Cefepime --Aspiration precautions  Severe sepsis (HCC) 11/30 afternoon: Febrile 101.3  F, tachycardic HR 115, and soft BP's persist.  Pt still encephalopathic as well, consistent with organ dysfunction and severe sepsis. 11/30 repeat chest x-ray showed worsening right-sided pneumonia -- Continue empiric Cefepime (cover for possible complicated UTI related to foley and urinary retention vs recurrent PNA) --Blood and urine culture pending --Lactic acid and procal are pending --Monitor fever curve, CBC  Dysphagia Chronic since at least 2015, has progressed over time.  Higher admission seen by speech therapy and noted to have aspiration with both thin and thickened liquids.  Now with worsening recurrent right-sided pneumonia. -- SLP following --Plan for MBSS on Monday --Pured diet with honey thickened liquids --Low threshold to make n.p.o. --Palliative care consulted for goals of care  Schizoaffective disorder, bipolar type (HCC) .  Unintentional weight loss Per chart review, pt has had 9.5% wt loss over the past 6 months, which is not considered significant for time frame, but given malnutrition is concerning.   --Needs outpatient colonoscopy if due for this (no prior records available) --Monitor weights --See malnutrition  Protein-calorie malnutrition, severe Related to chronic illness (COPD) as evidenced by severe fat depletion, severe muscle depletion. --Appreciate dietitian recommendations --Started on Ensures, MVI --Monitor weight periodically --Encourage PO intake given intellectual disability and cognitive decline.   GERD (gastroesophageal reflux disease) Continue PPI  Cognitive decline Treatment as outlined in 1  Falls Patient presents for evaluation of multiple falls and was seen in the ER 2 days prior to this admission.  CT's during prior ED visit revealed partially covered segmental fracture of the right zygoma.  No acute intracranial or C-spine findings. --Fall precautions --PT evaluation  --Needs SNF/rehab, TOC following        Subjective:  Patient was awake in bed, nurses at bedside attempting to replace Foley catheter.  Pt unable to void despite IV fluids and over 900cc on bladder scan.  His speech is very difficult to understand but able to make out he says feeling better.   Physical Exam: Vitals:   10/27/22 2028 10/27/22 2348 10/28/22 0550 10/28/22 0829  BP: 91/64 91/64 96/66  96/66  Pulse: 100 89 85 94  Resp: 18 16 16 18   Temp: 99.1 F (37.3 C) 98.8 F (37.1 C) 97.8 F (36.6 C) 98.4 F (36.9 C)  TempSrc: Oral  Axillary   SpO2: 93% 93% 95% 90%  Weight:      Height:       General exam: Awake resting in bed, no acute distress, underweight HEENT: Right periorbital ecchymosis fading, scattered small healing abrasions/scabs, moist mucus membranes, hearing grossly normal  Respiratory system: exam limited by patient talking, rhonchi seem improved, normal respiratory effort. Cardiovascular system: normal S1/S2, RRR, no pedal edema.   Gastrointestinal system: soft, nontender abdomen Central nervous system: Grossly nonfocal exam, normal speech Extremities: moves all, no edema, normal tone Skin: dry, intact, normal temperature Psychiatry: Normal mood, no agitation or apparent hallucinations   Data Reviewed:  Notable labs --- metabolic panel with glucose 146, calcium 8.2, albumin 2.4, T. bili 1.3.  Procalcitonin 0.52.  Lactic acid 1.5, 1.3.  CBC with resolved leukocytosis, hemoglobin stable 9.9  .    11/29 Urine culture pending - grew Staph aureus ( resistant to Cipro) and Enterococcus faecalis (no resistance)  11/30 chest x-ray showed worsening of right-sided pneumonia, highly suspicious for aspiration. IMPRESSION: Chronic emphysematous changes with chronic scarring within the lateral right mid lung. Increased opacification in this region compared to the most recent 10/24/2022 radiograph, with the current appearance more similar to pneumonia  seen on prior 10/01/2022 and 09/24/2022 radiographs. This is favored to  represent recurrence of pneumonia in this region.  11/30 Blood culture pending - negative to date   Family Communication: Legal guardian updated in person 11/29, by phone 11/30, 12/1.  Disposition: Status is: Inpatient Remains inpatient appropriate because:   --remains on IV antibiotics --ongoing evaluation of urinary retention and dysphagia --unable to return to group home, requires SNF placement.    Planned Discharge Destination: Skilled nursing facility     Time spent: 45 minutes  Author: Pennie Banter, DO 10/28/2022 10:53 AM  For on call review [email or web address].

## 2022-10-28 NOTE — Progress Notes (Signed)
   10/26/22 1629  Assess: MEWS Score  Temp (!) 101.3 F (38.5 C)  BP 91/66  MAP (mmHg) 75  Pulse Rate (!) 115  Resp 19  SpO2 (!) 87 %  O2 Device Room Air  Assess: MEWS Score  MEWS Temp 1  MEWS Systolic 1  MEWS Pulse 2  MEWS RR 0  MEWS LOC 0  MEWS Score 4  MEWS Score Color Red  Assess: if the MEWS score is Yellow or Red  Were vital signs taken at a resting state? Yes  Focused Assessment No change from prior assessment  Does the patient meet 2 or more of the SIRS criteria? Yes  Does the patient have a confirmed or suspected source of infection? No  MEWS guidelines implemented *See Row Information* Yes  Treat  MEWS Interventions Escalated (See documentation below)  Pain Scale 0-10  Pain Score 0  Take Vital Signs  Increase Vital Sign Frequency  Red: Q 1hr X 4 then Q 4hr X 4, if remains red, continue Q 4hrs  Escalate  MEWS: Escalate Red: discuss with charge nurse/RN and provider, consider discussing with RRT  Notify: Charge Nurse/RN  Name of Charge Nurse/RN Notified Verl Bangs RN  Date Charge Nurse/RN Notified 10/26/22  Time Charge Nurse/RN Notified 1630  Provider Notification  Provider Name/Title Dr Denton Lank  Date Provider Notified 10/26/22  Time Provider Notified 1000  Method of Notification Page  Notification Reason Other (Comment) (RED MEWS)  Date of Provider Response 10/26/22  Time of Provider Response 1704  Notify: Rapid Response  Name of Rapid Response RN Notified Meghan Furr  Date Rapid Response Notified 10/26/22  Time Rapid Response Notified 1713  Document  Patient Outcome Stabilized after interventions  Assess: SIRS CRITERIA  SIRS Temperature  1  SIRS Pulse 1  SIRS Respirations  0  SIRS WBC 1  SIRS Score Sum  3

## 2022-10-28 NOTE — Assessment & Plan Note (Signed)
Recent admission for PNA felt due to aspiration.  Noted to have progression of chronic dysphagia by SLP.  Became septic again on 11/30 and CXR showed worsening right-sided infiltrates.   12/3: recurrent fever 100.4 last night --See dysphagia --Initially treated w Cefepime --Now on Unasyn, continue --Aspiration precautions

## 2022-10-29 DIAGNOSIS — F250 Schizoaffective disorder, bipolar type: Secondary | ICD-10-CM

## 2022-10-29 DIAGNOSIS — N401 Enlarged prostate with lower urinary tract symptoms: Secondary | ICD-10-CM | POA: Diagnosis not present

## 2022-10-29 DIAGNOSIS — J690 Pneumonitis due to inhalation of food and vomit: Secondary | ICD-10-CM | POA: Diagnosis not present

## 2022-10-29 DIAGNOSIS — N390 Urinary tract infection, site not specified: Secondary | ICD-10-CM | POA: Diagnosis present

## 2022-10-29 DIAGNOSIS — G9341 Metabolic encephalopathy: Secondary | ICD-10-CM | POA: Diagnosis not present

## 2022-10-29 LAB — CBC
HCT: 28.3 % — ABNORMAL LOW (ref 39.0–52.0)
Hemoglobin: 9.2 g/dL — ABNORMAL LOW (ref 13.0–17.0)
MCH: 30.8 pg (ref 26.0–34.0)
MCHC: 32.5 g/dL (ref 30.0–36.0)
MCV: 94.6 fL (ref 80.0–100.0)
Platelets: 235 10*3/uL (ref 150–400)
RBC: 2.99 MIL/uL — ABNORMAL LOW (ref 4.22–5.81)
RDW: 15.4 % (ref 11.5–15.5)
WBC: 10.8 10*3/uL — ABNORMAL HIGH (ref 4.0–10.5)
nRBC: 0 % (ref 0.0–0.2)

## 2022-10-29 LAB — BASIC METABOLIC PANEL
Anion gap: 8 (ref 5–15)
BUN: 9 mg/dL (ref 8–23)
CO2: 25 mmol/L (ref 22–32)
Calcium: 8.4 mg/dL — ABNORMAL LOW (ref 8.9–10.3)
Chloride: 105 mmol/L (ref 98–111)
Creatinine, Ser: 0.5 mg/dL — ABNORMAL LOW (ref 0.61–1.24)
GFR, Estimated: >60 mL/min (ref >60)
Glucose, Bld: 118 mg/dL — ABNORMAL HIGH (ref 70–99)
Potassium: 3.6 mmol/L (ref 3.5–5.1)
Sodium: 138 mmol/L (ref 135–145)

## 2022-10-29 NOTE — Progress Notes (Signed)
Dr Denton Lank notified of po medication refusal.

## 2022-10-29 NOTE — Assessment & Plan Note (Signed)
Foley had been placed for urinary retention in the ED a few days prior to this admission.  Due to AMS and cognition, unclear in urinary symptoms. Urine culture with staph aureus and enterococcus both sensitive to ampicillin. --Continue Unasyn for Uti and aspiration PNA --Foley replaced 12/2 after failed voiding trial

## 2022-10-29 NOTE — TOC Progression Note (Signed)
Transition of Care [facility]) - Progression Note    Patient Details  Name: Orell Hurtado MRN: [id number] Date of Birth: October 06, 1956  [facility] CM/SW Contact  Maree Krabbe, LCSW Phone Number: 10/29/2022, 11:54 AM  Clinical Narrative:   CSW started auth in [place] portal for anticipated dc 12/5. However, pt will need to work with PT in order for Navi to have updated clinicals.          Expected Discharge Plan and Services                                                 Social Determinants of Health (SDOH) Interventions    Readmission Risk Interventions     No data to display

## 2022-10-29 NOTE — Progress Notes (Signed)
Progress Note   Patient: Ian Medina [id number] DOB: 06/28/1956 DOA: 10/24/2022     5 DOS: the patient was seen and examined on 10/29/2022   Brief hospital course: Ian Medina is a 66 y.o. male with medical history significant for schizoaffective disorder, mild intellectual disability, nicotine dependence, COPD, generalized anxiety disorder , hypertension, hepatitis C, group home resident with guardians through DSS.  He was brought into the ED on 10/24/2022 for evaluation of frequent falls, worsening cognitive decline and decrease in his ability to carry out his ADL's.  He had been seen in the ED twice recently for falls.  CT's of head, neck and face showed only a R zygomatic fracture.  He was found to have acute urinary retention at prior ED visit, a Foley catheter was placed.  Patient's legal guardian Ian Medina reports ongoing concern about patient's progressive decline which she describes as worsening confusion, frequent falls and inability to carry out his activities of daily living.  She also notes significant weight loss recently.   Patient was recently hospitalized from 09/24/22  through 09/28/22 for a similar presentation and at that time was found to have pneumonia and multiple rib fractures.  Per documentation his mental status improved and he was discharged to a SNF.  Pt admitted to the hospital for further evaluation and management.   11/30: afternoon pt febrile, tachycardic, meeting sepsis criteria. Code sepsis initiated.  Antibiotics started and further evaluation for source of infection underway.  UTI vs worsening pneumonia with probable aspiration.  Fluid resuscitation underway.  BP's low, but MAP so far holding > 65.  PCCM made aware, in case of need for pressors.   Assessment and Plan: * Acute metabolic encephalopathy Most likely related to worsening infection, progression of pneumonia likely aspiration, also with UTI.  Presented with progressively worsening mental status  changes - very confused and oriented only to person (per guardian states is unusual for him).  Concern for possible dementia in this patient who has CT scan findings suggestive of encephalomalacia in the left temporal lobe. Ammonia, B12, RPR level normal. UDS positive only for tricyclics. Covid negative. UA bloody, rare bacteria, unable to check nitrites or leukocytes.  Urine culture growing Staph aureus and Enterococcus faecalis CXR with incomplete resolution of airspace disease in the right lung (recently had PNA, currently without respiratory symptoms). --Follow cultures --Continue antibiotics as outlined --Psych consulted to review medications --Delirium precautions  Urinary retention due to benign prostatic hyperplasia Noted to have urinary retention during his last ER visit and had a Foley catheter placed. Presented this time with gross hematuria, suspect urethral trauma from insertion vs pt pulling on the Foley. PSA level is normal. 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 Urology aware. --Continue Flomax --Follow up with urology  Schizoaffective disorder, depressive type (HCC)-resolved as of 10/28/2022 Patient is on multiple antipsychotic agents which include Cogentin, Thorazine, Prozac, Zyprexa, trazodone and Ingrezza 11/29 -- persistent agitation despite Haldol.  IM Geodon ordered and psych was updated. --Psych consulted on admission --Meds continued --PRN IM Haldol for agitation --Thorazine PRN at beginning of agitation  Aspiration pneumonia (HCC) Recent admission for PNA felt due to aspiration.  Noted to have progression of chronic dysphagia by SLP.  Became septic again on 11/30 and CXR showed worsening right-sided infiltrates.   12/3: recurrent fever 100.4 last night --See dysphagia --Initially treated w Cefepime --Now  on  Unasyn, continue --Aspiration precautions  Dysphagia Chronic since at least 2015, has progressed over time.  Higher admission seen by speech therapy and noted to have aspiration with both thin and thickened liquids.  Now with worsening recurrent right-sided pneumonia. -- SLP following --Plan for MBSS on Monday --Pured diet with honey thickened liquids --Low threshold to make n.p.o. --Palliative care consulted for goals of care  Severe sepsis (HCC)-resolved as of 10/28/2022 11/30 afternoon: Febrile 101.3 F, tachycardic HR 115, and soft BP's persist.  Pt still encephalopathic as well, consistent with organ dysfunction and severe sepsis. 11/30 repeat chest x-ray showed worsening right-sided pneumonia -- Continue empiric Cefepime (cover for possible complicated UTI related to foley and urinary retention vs recurrent PNA) --Blood and urine culture pending --Lactic acid and procal are pending --Monitor fever curve, CBC  UTI (urinary tract infection) Foley had been placed for urinary retention in the ED a few days prior to this admission.  Due to AMS and cognition, unclear in urinary symptoms. Urine culture with staph aureus and enterococcus both sensitive to ampicillin. --Continue Unasyn for Uti and aspiration PNA --Foley replaced 12/2 after failed voiding trial   Schizoaffective disorder, bipolar type (HCC) .  Unintentional weight loss Per chart review, pt has had 9.5% wt loss over the past 6 months, which is not considered significant for time frame, but given malnutrition is concerning.   --Needs outpatient colonoscopy if due for this (no prior records available) --Monitor weights --See malnutrition  Protein-calorie malnutrition, severe Related to chronic illness (COPD) as evidenced by severe fat depletion, severe muscle depletion. --Appreciate dietitian recommendations --Started on Ensures, MVI --Monitor weight periodically --Encourage PO intake given intellectual disability  and cognitive decline.   GERD (gastroesophageal reflux disease) Continue PPI  Cognitive decline Treatment as outlined in 1  Falls Patient presents for evaluation of multiple falls and was seen in the ER 2 days prior to this admission.  CT's during prior ED visit revealed partially covered segmental fracture of the right zygoma.  No acute intracranial or C-spine findings. --Fall precautions --PT evaluation  --Needs SNF/rehab, TOC following  Hypokalemia-resolved as of 10/28/2022 K 3.3 on 11/30, replaced. --will give 3 runs IV K-Cl with fluids today for K 3.5 --Monitor BMP        Subjective: Patient was sleeping comfortably, woke up easily.  Becomes a little restless but easy to redirect and calm at the time.  He goes to pull on catheter right away upon waking.   Physical Exam: Vitals:   10/28/22 1708 10/28/22 2036 10/29/22 0506 10/29/22 1020  BP: 114/82 (!) 141/107 98/71 104/62  Pulse: 99 (!) 108 87 89  Resp: 17 18 20 17   Temp: (!) 97.3 F (36.3 C) (!) 100.4 F (38 C) 98.8 F (37.1 C) 98.6 F (37 C)  TempSrc:  Oral    SpO2: 94% 93% 93% 97%  Weight:      Height:       General exam: sleeping comfortably, woke up easily, no acute distress, underweight HEENT: moist mucus membranes, hearing grossly normal  Respiratory system: scattered rhonchi, but exam limited by patient talking, normal respiratory effort. Cardiovascular system: normal S1/S2, RRR, no pedal edema.   Gastrointestinal system: soft, nontender abdomen Genitourinary system: Foley in place with light yellow urine in bag Central nervous system: Grossly nonfocal exam, normal speech Extremities: moves all, no edema, normal tone Skin: dry, intact, normal temperature Psychiatry: Normal mood, no agitation or apparent hallucinations   Data Reviewed:  Notable labs --- Cr 0.50, Ca  8.4, WBC 10.8 from 9.7, Hbg 9.2 from 9.9  .    11/29 Urine culture pending - grew Staph aureus and Enterococcus faecalis - both  sensitive to ampicillin  11/30 chest x-ray showed worsening of right-sided pneumonia, highly suspicious for aspiration. IMPRESSION: Chronic emphysematous changes with chronic scarring within the lateral right mid lung. Increased opacification in this region compared to the most recent 10/24/2022 radiograph, with the current appearance more similar to pneumonia seen on prior 10/01/2022 and 09/24/2022 radiographs. This is favored to represent recurrence of pneumonia in this region.  11/30 Blood culture pending - negative to date   Family Communication: Legal guardian updated in person 11/29, by phone 11/30, 12/1.  Disposition: Status is: Inpatient Remains inpatient appropriate because:   --recurrent fever yesterday evening --remains on IV antibiotics --ongoing evaluation of urinary retention and dysphagia --unable to return to group home, requires SNF placement.    Planned Discharge Destination: Skilled nursing facility     Time spent: 45 minutes  Author: Pennie Banter, DO 10/29/2022 11:45 AM  For on call review [email or web address].

## 2022-10-29 NOTE — Progress Notes (Signed)
Physical Therapy Treatment Patient Details Name: Ian Medina MRN: [id number] DOB: 1956-08-19 Today's Date: 10/29/2022   History of Present Illness 66 y/o male presented to ED on 10/22/22 for falls then d/c'ed and again on 10/23/22 for AMS. Foley placed for urinary retention. Admitted for acute metabolic encephalopathy. PMH: dementia, cerebral aneurysm, COPD, emphysema, HTN, schizoaffective disorder, schizophrenia    PT Comments    Pt in room.  Tech in to check on pt.  Stated pt has been getting up on his own and takes a few steps before staff can enter room.  Pt is able to get to EOB with min tactile cues to transition to sitting but needs no physical assist.  Sits x 5 minutes with no LOB,  encouragement and multiple attempts and approaches to get pt to stand and walk.  He resists all attempts.  Returns to supine on his own.    Per TOC notes, pt is unable to return to group home due to level of care needed.  Pt does have good strength but cognition and seems to be primary barrier to progression of therapy at this time.  Pt would benefit from SNF stay to attempt to get pt back to baseline and back to group home.  Will adjust discharge recommendations as he has not met PT goals at this time.     Recommendations for follow up therapy are one component of a multi-disciplinary discharge planning process, led by the attending physician.  Recommendations may be updated based on patient status, additional functional criteria and insurance authorization.  Follow Up Recommendations  Skilled nursing-short term rehab (<3 hours/day)     Assistance Recommended at Discharge Frequent or constant Supervision/Assistance  Patient can return home with the following A lot of help with walking and/or transfers;A lot of help with bathing/dressing/bathroom   Equipment Recommendations  Rolling walker (2 wheels)    Recommendations for Other Services       Precautions / Restrictions Precautions Precautions:  Fall Restrictions Weight Bearing Restrictions: No     Mobility  Bed Mobility Overal bed mobility: Needs Assistance Bed Mobility: Supine to Sit, Sit to Supine Rolling: Supervision   Supine to sit: Supervision     General bed mobility comments: supervision for safety but tech reports pt getting up out of bed on his own in attempts at gait.    Transfers Overall transfer level: Needs assistance                 General transfer comment: refuses to attempt to stand but tech reports him taking several steps on his own in room as bed alarm goes off before they can get into room    Ambulation/Gait                   Stairs             Wheelchair Mobility    Modified Rankin (Stroke Patients Only)       Balance Overall balance assessment: Needs assistance Sitting-balance support: Feet supported Sitting balance-Leahy Scale: Good Sitting balance - Comments: supervision for safety                                    Cognition   Behavior During Therapy: [facility] for tasks assessed/performed, Restless Overall Cognitive Status: Impaired/Different from baseline  General Comments: session liimited more by cognition than strength        Exercises Other Exercises Other Exercises: unable to follow cues    General Comments        Pertinent Vitals/Pain Pain Assessment Pain Assessment: No/denies pain    Home Living                          Prior Function            PT Goals (current goals can now be found in the care plan section) Progress towards PT goals: Not progressing toward goals - comment    Frequency    Min 2X/week      PT Plan Discharge plan needs to be updated    Co-evaluation              AM-PAC PT "6 Clicks" Mobility   Outcome Measure  Help needed turning from your back to your side while in a flat bed without using bedrails?: None Help needed moving  from lying on your back to sitting on the side of a flat bed without using bedrails?: A Little Help needed moving to and from a bed to a chair (including a wheelchair)?: A Little Help needed standing up from a chair using your arms (e.g., wheelchair or bedside chair)?: A Lot Help needed to walk in hospital room?: A Lot Help needed climbing 3-5 steps with a railing? : Total 6 Click Score: 15    End of Session Equipment Utilized During Treatment: Gait belt Activity Tolerance: Patient tolerated treatment well Patient left: in bed;with call bell/phone within reach;with bed alarm set Nurse Communication: Mobility status PT Visit Diagnosis: Unsteadiness on feet (R26.81);Repeated falls (R29.6);Muscle weakness (generalized) (M62.81);History of falling (Z91.81);Difficulty in walking, not elsewhere classified (R26.2)     Time: 7867-5449 PT Time Calculation (min) (ACUTE ONLY): 10 min  Charges:  $Therapeutic Activity: 8-22 mins                   Chesley Noon, PTA 10/29/22, 3:45 PM

## 2022-10-30 DIAGNOSIS — R1313 Dysphagia, pharyngeal phase: Secondary | ICD-10-CM | POA: Diagnosis not present

## 2022-10-30 DIAGNOSIS — R4182 Altered mental status, unspecified: Secondary | ICD-10-CM | POA: Diagnosis not present

## 2022-10-30 DIAGNOSIS — Z7189 Other specified counseling: Secondary | ICD-10-CM

## 2022-10-30 DIAGNOSIS — G9341 Metabolic encephalopathy: Secondary | ICD-10-CM | POA: Diagnosis not present

## 2022-10-30 DIAGNOSIS — F250 Schizoaffective disorder, bipolar type: Secondary | ICD-10-CM | POA: Diagnosis not present

## 2022-10-30 LAB — CBC
HCT: 26.2 % — ABNORMAL LOW (ref 39.0–52.0)
Hemoglobin: 8.4 g/dL — ABNORMAL LOW (ref 13.0–17.0)
MCH: 30.5 pg (ref 26.0–34.0)
MCHC: 32.1 g/dL (ref 30.0–36.0)
MCV: 95.3 fL (ref 80.0–100.0)
Platelets: 258 10*3/uL (ref 150–400)
RBC: 2.75 MIL/uL — ABNORMAL LOW (ref 4.22–5.81)
RDW: 15.8 % — ABNORMAL HIGH (ref 11.5–15.5)
WBC: 8.9 10*3/uL (ref 4.0–10.5)
nRBC: 0 % (ref 0.0–0.2)

## 2022-10-30 LAB — PROCALCITONIN: Procalcitonin: 0.15 ng/mL

## 2022-10-30 MED ORDER — HALOPERIDOL LACTATE 5 MG/ML IJ SOLN
2.0000 mg | Freq: Once | INTRAMUSCULAR | Status: AC
  Administered 2022-10-30: 2 mg via INTRAVENOUS
  Filled 2022-10-30: qty 1

## 2022-10-30 NOTE — Progress Notes (Signed)
Occupational Therapy Treatment Patient Details Name: Ian Medina MRN: [id number] DOB: 1956/02/06 Today's Date: 10/30/2022   History of present illness 66 y/o male presented to ED on 10/22/22 for falls then d/c'ed and again on 10/23/22 for AMS. Foley placed for urinary retention. Admitted for acute metabolic encephalopathy. PMH: dementia, cerebral aneurysm, COPD, emphysema, HTN, schizoaffective disorder, schizophrenia   OT comments  Upon entering the room, pt supine in bed with covers pulled over head. Pt does alert and speaks to therapist briefly. OT provided warm cloth for face in an attempt to alert him further but needing total hand over hand assistance to wash face. Pt with tangential speech throughout and appears internally and externally distracted with max multimodal cuing to re direct. Pt does not follow any commands this session. OT moving pt's B LEs off EOB to sit up with pt immediately drawing them back into bed and pulling covers over head. Recommendation changed to SNF but pt may needs LTC if he does not begin to participate and follow commands during therapy sessions.    Recommendations for follow up therapy are one component of a multi-disciplinary discharge planning process, led by the attending physician.  Recommendations may be updated based on patient status, additional functional criteria and insurance authorization.    Follow Up Recommendations  Skilled nursing-short term rehab (<3 hours/day)     Assistance Recommended at Discharge Frequent or constant Supervision/Assistance  Patient can return home with the following  A little help with walking and/or transfers;A little help with bathing/dressing/bathroom;Help with stairs or ramp for entrance;Assist for transportation;Direct supervision/assist for financial management;Direct supervision/assist for medications management   Equipment Recommendations  Other (comment) (defer to next venue of care)       Precautions /  Restrictions Precautions Precautions: Fall       Mobility Bed Mobility               General bed mobility comments: refusal    Transfers                   General transfer comment: refusal         ADL either performed or assessed with clinical judgement   ADL Overall ADL's : Needs assistance/impaired                                       General ADL Comments: unable to initiate and needing hand over hand technique    Extremity/Trunk Assessment Upper Extremity Assessment Upper Extremity Assessment: Generalized weakness   Lower Extremity Assessment Lower Extremity Assessment: Generalized weakness        Vision Patient Visual Report: No change from baseline            Cognition Arousal/Alertness: Lethargic Behavior During Therapy: Flat affect Overall Cognitive Status: Impaired/Different from baseline Area of Impairment: Orientation, Attention, Following commands, Safety/judgement, Awareness, Problem solving                 Orientation Level: Disoriented to, Place, Time, Situation Current Attention Level: Focused   Following Commands: Follows one step commands inconsistently, Follows one step commands with increased time Safety/Judgement: Decreased awareness of safety, Decreased awareness of deficits Awareness: Intellectual Problem Solving: Slow processing, Decreased initiation, Difficulty sequencing, Requires verbal cues, Requires tactile cues                     Pertinent Vitals/ Pain  Pain Assessment Pain Assessment: No/denies pain         Frequency  Min 2X/week        Progress Toward Goals  OT Goals(current goals can now be found in the care plan section)  Progress towards OT goals: Not progressing toward goals - comment  Acute Rehab OT Goals Patient Stated Goal: none stated OT Goal Formulation: Patient unable to participate in goal setting Time For Goal Achievement: 11/09/22 Potential to  Achieve Goals: Fair  Plan Discharge plan remains appropriate;Frequency remains appropriate       AM-PAC OT "6 Clicks" Daily Activity     Outcome Measure   Help from another person eating meals?: A Little Help from another person taking care of personal grooming?: A Little Help from another person toileting, which includes using toliet, bedpan, or urinal?: A Lot Help from another person bathing (including washing, rinsing, drying)?: A Lot Help from another person to put on and taking off regular upper body clothing?: A Lot Help from another person to put on and taking off regular lower body clothing?: A Lot 6 Click Score: 14    End of Session    OT Visit Diagnosis: Unsteadiness on feet (R26.81);Repeated falls (R29.6);Muscle weakness (generalized) (M62.81)   Activity Tolerance Other (comment) (limited by cognition)   Patient Left in bed;with call bell/phone within reach;with bed alarm set   Nurse Communication Mobility status        Time: 4128-7867 OT Time Calculation (min): 11 min  Charges: OT General Charges $OT Visit: 1 Visit OT Treatments $Therapeutic Activity: 8-22 mins  Jackquline Denmark, MS, OTR/L , CBIS ascom [phone]  10/30/22, 3:39 PM

## 2022-10-30 NOTE — Progress Notes (Addendum)
Progress Note   Patient: Ian Medina QMV:[id number] DOB: [birth year] DOA: 10/24/2022     6 DOS: the patient was seen and examined on 10/30/2022   Brief hospital course: Ian Medina is a 66 y.o. male with medical history significant for schizoaffective disorder, mild intellectual disability, nicotine dependence, COPD, generalized anxiety disorder , hypertension, hepatitis C, group home resident with guardians through DSS.  He was brought into the ED on 10/24/2022 for evaluation of frequent falls, worsening cognitive decline and decrease in his ability to carry out his ADL's.  He had been seen in the ED twice recently for falls.  CT's of head, neck and face showed only a R zygomatic fracture.  He was found to have acute urinary retention at prior ED visit, a Foley catheter was placed.  Patient's legal guardian [name] reports ongoing concern about patient's progressive decline which she describes as worsening confusion, frequent falls and inability to carry out his activities of daily living.  She also notes significant weight loss recently.   Patient was recently hospitalized from 09/24/22  through 09/28/22 for a similar presentation and at that time was found to have pneumonia and multiple rib fractures.  Per documentation his mental status improved and he was discharged to a SNF.  Pt admitted to the hospital for further evaluation and management.   11/30: afternoon pt febrile, tachycardic, meeting sepsis criteria. Code sepsis initiated.  Antibiotics started and further evaluation for source of infection underway.  UTI vs worsening pneumonia with probable aspiration.  Fluid resuscitation underway.  BP's low, but MAP so far holding > 65.  PCCM made aware, in case of need for pressors.   Assessment and Plan: * Acute metabolic encephalopathy Most likely related to worsening infection, progression of pneumonia likely aspiration, also with UTI.  Presented with progressively worsening mental status  changes - very confused and oriented only to person (per guardian states is unusual for him).  Concern for possible dementia in this patient who has CT scan findings suggestive of encephalomalacia in the left temporal lobe. Ammonia, B12, RPR level normal. UDS positive only for tricyclics. Covid negative. UA bloody, rare bacteria, unable to check nitrites or leukocytes.  Urine culture growing Staph aureus and Enterococcus faecalis CXR with incomplete resolution of airspace disease in the right lung (recently had PNA, currently without respiratory symptoms). --Follow cultures --Continue antibiotics as outlined --Psych consulted to review medications --Delirium precautions  Urinary retention due to benign prostatic hyperplasia Noted to have urinary retention during his last ER visit and had a Foley catheter placed. Presented this time with gross hematuria, suspect urethral trauma from insertion vs pt pulling on the Foley. PSA level is normal. 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 Urology aware. --Continue Flomax --Follow up with urology  Schizoaffective disorder, depressive type (HCC)-resolved as of 10/28/2022 Patient is on multiple antipsychotic agents which include Cogentin, Thorazine, Prozac, Zyprexa, trazodone and Ingrezza 11/29 -- persistent agitation despite Haldol.  IM Geodon ordered and psych was updated. --Psych consulted on admission --Meds continued --PRN IM Haldol for agitation --Thorazine PRN at beginning of agitation  Aspiration pneumonia (HCC) Recent admission for PNA felt due to aspiration.  Noted to have progression of chronic dysphagia by SLP.  Became septic again on 11/30 and CXR showed worsening right-sided infiltrates.   12/3: recurrent fever 100.4 last night --See dysphagia --Initially treated w Cefepime --Now  on  Unasyn, continue --Aspiration precautions  Dysphagia Chronic since at least 2015, has progressed over time.  Higher admission seen by speech therapy and noted to have aspiration with both thin and thickened liquids.  Now with worsening recurrent right-sided pneumonia. -- SLP following --MBSS deferred due to construction, can be done outpatient --Tolerating pured diet with honey thickened liquids --Palliative care consulted for goals of care  Severe sepsis (HCC)-resolved as of 10/28/2022 11/30 afternoon: Febrile 101.3 F, tachycardic HR 115, and soft BP's persist.  Pt still encephalopathic as well, consistent with organ dysfunction and severe sepsis. 11/30 repeat chest x-ray showed worsening right-sided pneumonia -- Continue empiric Cefepime (cover for possible complicated UTI related to foley and urinary retention vs recurrent PNA) --Blood and urine culture pending --Lactic acid and procal are pending --Monitor fever curve, CBC  UTI (urinary tract infection) Foley had been placed for urinary retention in the ED a few days prior to this admission.  Due to AMS and cognition, unclear in urinary symptoms. Urine culture with staph aureus and enterococcus both sensitive to ampicillin. --Continue Unasyn for Uti and aspiration PNA --Foley replaced 12/2 after failed voiding trial   Schizoaffective disorder, bipolar type [facility]) Patient is on multiple antipsychotic agents which include Cogentin, Thorazine, Prozac, Zyprexa, trazodone and Ingrezza 11/29 -- persistent agitation despite Haldol.  IM Geodon ordered and psych was updated. --Psych consulted on admission --Meds continued --PRN IM Haldol for agitation --Thorazine PRN at beginning of agitation  Unintentional weight loss Per chart review, pt has had 9.5% wt loss over the past 6 months, which is not considered significant for time frame, but given malnutrition is concerning.   --Needs outpatient colonoscopy if due for this (no prior  records available) --Monitor weights --See malnutrition  Protein-calorie malnutrition, severe Related to chronic illness (COPD) as evidenced by severe fat depletion, severe muscle depletion. --Appreciate dietitian recommendations --Started on Ensures, MVI --Monitor weight periodically --Encourage PO intake given intellectual disability and cognitive decline.   GERD (gastroesophageal reflux disease) Continue PPI  Cognitive decline Treatment as outlined in 1  Falls Patient presents for evaluation of multiple falls and was seen in the ER 2 days prior to this admission.  CT's during prior ED visit revealed partially covered segmental fracture of the right zygoma.  No acute intracranial or C-spine findings. --Fall precautions --PT evaluation  --Needs SNF/rehab, TOC following  Hypokalemia-resolved as of 10/28/2022 K 3.3 on 11/30, replaced. --will give 3 runs IV K-Cl with fluids today for K 3.5 --Monitor BMP        Subjective: Patient was sleeping comfortably, became restless with exam.  He was given thorazine PRN dose for agitation earlier.  Later became increasingly agitated and needed IV haldol.  No fever in over 24 hours.  Sleeping a lot and gets restless and agitated when awake.   Physical Exam: Vitals:   10/29/22 1020 10/29/22 1616 10/30/22 0642 10/30/22 1200  BP: 104/62 (!) 96/58 112/67 108/62  Pulse: 89 100 96 (!) 104  Resp: 17 16 16 18   Temp: 98.6 F (37 C) 97.8 F (36.6 C) 98 F (36.7 C) 98.3 F (36.8 C)  TempSrc:   Oral Oral  SpO2: 97% 93% 92% 92%  Weight:      Height:       General exam: sleeping comfortably, woke up easily, no acute distress, underweight HEENT: moist mucus membranes, hearing grossly normal  Respiratory system: lungs with intermittent rhonchi but improving, normal respiratory effort. Cardiovascular system: normal S1/S2, RRR, no pedal edema.   Gastrointestinal  system: soft, nontender abdomen Genitourinary system: Foley in place with light  yellow urine in bag Central nervous system: Grossly nonfocal exam, normal speech Extremities: moves all, no edema, normal tone Skin: dry, intact, normal temperature Psychiatry: Normal mood, no agitation or apparent hallucinations   Data Reviewed:  Notable labs --- glucose 118, Cr. 0.50, Ca 8.4 on BMP 12/3  .  WBC normalized 8.9, Hbg 8.4 from 9.2.   11/29 Urine culture - grew Staph aureus and Enterococcus faecalis - both sensitive to ampicillin  11/30 chest x-ray showed worsening of right-sided pneumonia, highly suspicious for aspiration. IMPRESSION: Chronic emphysematous changes with chronic scarring within the lateral right mid lung. Increased opacification in this region compared to the most recent 10/24/2022 radiograph, with the current appearance more similar to pneumonia seen on prior 10/01/2022 and 09/24/2022 radiographs. This is favored to represent recurrence of pneumonia in this region.  11/30 Blood culture pending - negative to date   Family Communication: Legal guardian updated in person 11/29, by phone 11/30, 12/1, 12/4.   Disposition: Status is: Inpatient Remains inpatient appropriate because:   --severity of illness --remains on IV antibiotics and other IV therapies as outlined --ongoing evaluation of urinary retention and dysphagia --unable to return to group home, requires SNF placement.    Planned Discharge Destination: Skilled nursing facility     Time spent: 40 minutes  Author: Pennie Banter, DO 10/30/2022 1:58 PM  For on call review [email or web address].

## 2022-10-30 NOTE — TOC Progression Note (Addendum)
Transition of Care [facility]) - Progression Note    Patient Details  Name: Ian Medina MRN: [id number] Date of Birth: [birth year]  Transition of Care [facility]) CM/SW Contact  Tempie Hoist, [place] Phone Number: 10/30/2022, 4:46 PM  Clinical Narrative:      The patient has been accepted to [facility]. He will be ready in 1-2 days. Patient may need a sitter at the SNF.   Patient needs updated PT notes uploaded to navi portal.    Expected Discharge Plan and Services      [place] [facility]                                           Social Determinants of Health (SDOH) Interventions    Readmission Risk Interventions     No data to display

## 2022-10-30 NOTE — Progress Notes (Signed)
Physical Therapy Treatment Patient Details Name: Ian Medina MRN: [id number] DOB: 1956/08/04 Today's Date: 10/30/2022   History of Present Illness 66 y/o male presented to ED on 10/22/22 for falls then d/c'ed and again on 10/23/22 for AMS. Foley placed for urinary retention. Admitted for acute metabolic encephalopathy. PMH: dementia, cerebral aneurysm, COPD, emphysema, HTN, schizoaffective disorder, schizophrenia    PT Comments    Pt awake up on arrival.  Stating he wanted a drink.  Thinks I am his sister, Misty Stanley.  He does sit when asked on EOB in order to drink.  Declined thickened lemon water but drinks all of ensure without assist or LOB.  He is encouraged to get to chair or stand but wants to lay back down.  He is able to take one lateral scoot along bed without assist but self directs session and returns to bed.  Cognition remains primary barrier to progression of mobility.   Recommendations for follow up therapy are one component of a multi-disciplinary discharge planning process, led by the attending physician.  Recommendations may be updated based on patient status, additional functional criteria and insurance authorization.  Follow Up Recommendations  Skilled nursing-short term rehab (<3 hours/day)     Assistance Recommended at Discharge Frequent or constant Supervision/Assistance  Patient can return home with the following A lot of help with walking and/or transfers;A lot of help with bathing/dressing/bathroom   Equipment Recommendations  Rolling walker (2 wheels)    Recommendations for Other Services       Precautions / Restrictions Precautions Precautions: Fall Restrictions Weight Bearing Restrictions: No     Mobility  Bed Mobility Overal bed mobility: Needs Assistance Bed Mobility: Supine to Sit, Sit to Supine Rolling: Supervision   Supine to sit: Supervision     General bed mobility comments: supervision for safety and catheter/IV management    Transfers                    General transfer comment: refused but did one lateral scoot along bed before returning to supine    Ambulation/Gait                   Stairs             Wheelchair Mobility    Modified Rankin (Stroke Patients Only)       Balance                                            Cognition Arousal/Alertness: Awake/alert Behavior During Therapy: Restless, Anxious Overall Cognitive Status: Impaired/Different from baseline                                 General Comments: session liimited more by cognition than strength        Exercises      General Comments        Pertinent Vitals/Pain Pain Assessment Pain Assessment: No/denies pain    Home Living                          Prior Function            PT Goals (current goals can now be found in the care plan section) Progress towards PT goals: Not progressing toward goals - comment  Frequency    Min 2X/week      PT Plan Current plan remains appropriate    Co-evaluation              AM-PAC PT "6 Clicks" Mobility   Outcome Measure  Help needed turning from your back to your side while in a flat bed without using bedrails?: None Help needed moving from lying on your back to sitting on the side of a flat bed without using bedrails?: A Little Help needed moving to and from a bed to a chair (including a wheelchair)?: A Little Help needed standing up from a chair using your arms (e.g., wheelchair or bedside chair)?: A Lot Help needed to walk in hospital room?: A Lot Help needed climbing 3-5 steps with a railing? : Total 6 Click Score: 15    End of Session   Activity Tolerance: Patient tolerated treatment well Patient left: in bed;with call bell/phone within reach;with bed alarm set Nurse Communication: Mobility status PT Visit Diagnosis: Unsteadiness on feet (R26.81);Repeated falls (R29.6);Muscle weakness (generalized)  (M62.81);History of falling (Z91.81);Difficulty in walking, not elsewhere classified (R26.2)     Time: 7680-8811 PT Time Calculation (min) (ACUTE ONLY): 8 min  Charges:  $Therapeutic Activity: 8-22 mins                     {Kristilyn Coltrane, PTA 10/30/22, 8:51 AM

## 2022-10-30 NOTE — Progress Notes (Signed)
Patient ID: Ian Medina, male   DOB: 1956-04-14, 66 y.o.   MRN: [id number]    Progress Note from the Palliative Medicine Team at [facility]   Patient Name: Ian Medina        Date: 10/30/2022 DOB: 29-Dec-1955  Age: 66 y.o. MRN#: [id number] Attending Physician: Pennie Banter, DO Primary Care Physician: Lorelee Market, MD Admit Date: 10/24/2022   Medical records reviewed, assessed the patient and discussed with attending   66 y.o. male  with past medical history of schizoaffective disorder, mild intellectual disability, nicotine dependence, COPD, GAD, HTN, hep C, and frequent falls admitted on 10/24/2022 for evaluation of frequent falls, worsening cognitive decline, and decrease in ability to carry out ADLs.   Upon arrival to ED, patient CT of head neck and face revealed right zygomatic fracture.  Patient also found to have acute urinary retention and Foley was placed.  Patient is being treated for acute metabolic encephalopathy, urinary retention, and cognitive decline with falls.    Admitted for treatment and stabilization.   PMT was consulted to discuss goals of care.  Initial PMT consult 10/27/2022  Patient has had 2 admissions and 5 ER visits in the last 6 months  This NP assessed patient at the bedside as a follow up for palliative medicine needs and emotional support.  Patient does not have medical decision making capacity at this time.  I spoke to [employer] patient's legal guardian.  She reports continued physical, functional and cognitive decline over the past 6 months.  She voices that she believes that the decline corresponded with psychiatric medication adjustments made at the facility.  Education offered on the patient's multiple co-morbidities and the impact on patient's overall failure to thrive.  Education offered on the importance of securing advance care planning documents for this patient.  Discussed the MOST form, specific to decisions regarding CODE STATUS,  artificial feeding and hydration, long-term use of IV antibiotics and rehospitalization's.  Education offered on the difference between aggressive medical intervention path and a palliative comfort path for this patient, at this time in this situation.  Education offered that medical providers can offer recommendations from a medical standpoint but reinforced the importance of legal guardian, having personal interaction with this patient,  to make decisions regarding patient's values as it relates to quality of life.  Education offered today regarding  the importance of continued conversation with DSS department and the medical providers regarding overall plan of care and treatment options,  ensuring decisions are within the context of the patients values and GOCs.  Questions and concerns addressed   Discussed with Dr Denton Lank via secure chat   Total time spent on the unit was 50 minutes greater than 50% of the time was spent in counseling and coordination of care.  I encouraged legal guardian to call palliative medicine with questions or concerns   Lorinda Creed NP  Palliative Medicine Team Team Phone # [phone] Pager [phone]

## 2022-10-31 DIAGNOSIS — F251 Schizoaffective disorder, depressive type: Secondary | ICD-10-CM | POA: Diagnosis not present

## 2022-10-31 DIAGNOSIS — N401 Enlarged prostate with lower urinary tract symptoms: Secondary | ICD-10-CM | POA: Diagnosis not present

## 2022-10-31 DIAGNOSIS — I959 Hypotension, unspecified: Secondary | ICD-10-CM | POA: Diagnosis present

## 2022-10-31 DIAGNOSIS — G9341 Metabolic encephalopathy: Secondary | ICD-10-CM | POA: Diagnosis not present

## 2022-10-31 DIAGNOSIS — I951 Orthostatic hypotension: Secondary | ICD-10-CM | POA: Diagnosis present

## 2022-10-31 LAB — CULTURE, BLOOD (SINGLE)
Culture: NO GROWTH
Special Requests: ADEQUATE

## 2022-10-31 LAB — CBC
HCT: 27.1 % — ABNORMAL LOW (ref 39.0–52.0)
Hemoglobin: 8.6 g/dL — ABNORMAL LOW (ref 13.0–17.0)
MCH: 30.7 pg (ref 26.0–34.0)
MCHC: 31.7 g/dL (ref 30.0–36.0)
MCV: 96.8 fL (ref 80.0–100.0)
Platelets: 278 10*3/uL (ref 150–400)
RBC: 2.8 MIL/uL — ABNORMAL LOW (ref 4.22–5.81)
RDW: 15.9 % — ABNORMAL HIGH (ref 11.5–15.5)
WBC: 8.4 10*3/uL (ref 4.0–10.5)
nRBC: 0 % (ref 0.0–0.2)

## 2022-10-31 LAB — BASIC METABOLIC PANEL
Anion gap: 7 (ref 5–15)
BUN: 11 mg/dL (ref 8–23)
CO2: 28 mmol/L (ref 22–32)
Calcium: 8.1 mg/dL — ABNORMAL LOW (ref 8.9–10.3)
Chloride: 104 mmol/L (ref 98–111)
Creatinine, Ser: 0.49 mg/dL — ABNORMAL LOW (ref 0.61–1.24)
GFR, Estimated: >60 mL/min (ref >60)
Glucose, Bld: 97 mg/dL (ref 70–99)
Potassium: 3.5 mmol/L (ref 3.5–5.1)
Sodium: 139 mmol/L (ref 135–145)

## 2022-10-31 MED ORDER — LACTATED RINGERS IV BOLUS
250.0000 mL | Freq: Once | INTRAVENOUS | Status: AC
  Administered 2022-10-31: 250 mL via INTRAVENOUS

## 2022-10-31 MED ORDER — BENZONATATE 100 MG PO CAPS
200.0000 mg | ORAL_CAPSULE | Freq: Three times a day (TID) | ORAL | Status: DC | PRN
  Administered 2022-10-31 – 2022-11-04 (×2): 200 mg via ORAL
  Filled 2022-10-31 (×2): qty 2

## 2022-10-31 MED ORDER — POTASSIUM CHLORIDE 2 MEQ/ML IV SOLN
INTRAVENOUS | Status: DC
  Filled 2022-10-31 (×11): qty 1000

## 2022-10-31 NOTE — TOC Progression Note (Addendum)
Transition of Care [facility]) - Progression Note    Patient Details  Name: Graciano Batson MRN: [id number] Date of Birth: 09/06/1956  Transition of Care [facility]) CM/SW Contact  Tempie Hoist, [place] Phone Number: 10/31/2022, 10:15 AM  Clinical Narrative:     TOC spoke to Annamary Carolin [phone] at [facility]. TOC told her that the patient is hypotensive and restarted IV fluids. Patient will be in the hospital a few more days. TOC uploaded PT notes to the portal.     [facility] ID is [id number]. Plan auth ID is still pending.  Expected Discharge Plan and Services          [facility]                                       Social Determinants of Health (SDOH) Interventions    Readmission Risk Interventions     No data to display

## 2022-10-31 NOTE — Progress Notes (Signed)
Occupational Therapy Treatment Patient Details Name: Ian Medina MRN: [id number] DOB: 1956/11/24 Today's Date: 10/31/2022   History of present illness 66 y/o male presented to ED on 10/22/22 for falls then d/c'ed and again on 10/23/22 for AMS. Foley placed for urinary retention. Admitted for acute metabolic encephalopathy. PMH: dementia, cerebral aneurysm, COPD, emphysema, HTN, schizoaffective disorder, schizophrenia   OT comments  Upon entering the room, pt seated in recliner chair and needing encouragement for participation. Pt is oriented to self only. Pt assisted via recliner chair into hall and stands with min A and holds onto IV pole for ambulation with min A of 2 (chair follow) for safety with ambulation. Pt with cuing for safety awareness. He reports, " I can't go anymore. I have to sit down" and sits in recliner chair to be assisted back into room. Use of posey alarm belt for safety while seated in chair. Pt is pleasant during session. All needs within reach and PTA remains in room with pt as therapist exits the room.    Recommendations for follow up therapy are one component of a multi-disciplinary discharge planning process, led by the attending physician.  Recommendations may be updated based on patient status, additional functional criteria and insurance authorization.    Follow Up Recommendations  Skilled nursing-short term rehab (<3 hours/day)     Assistance Recommended at Discharge Frequent or constant Supervision/Assistance  Patient can return home with the following  A little help with walking and/or transfers;A little help with bathing/dressing/bathroom;Help with stairs or ramp for entrance;Assist for transportation;Direct supervision/assist for financial management;Direct supervision/assist for medications management   Equipment Recommendations  Other (comment) (defer to next venue of care)       Precautions / Restrictions Precautions Precautions:  Fall Restrictions Weight Bearing Restrictions: No       Mobility Bed Mobility               General bed mobility comments: in recliner upon arrival    Transfers Overall transfer level: Needs assistance   Transfers: Sit to/from Stand Sit to Stand: Min assist, +2 safety/equipment                 Balance Overall balance assessment: Needs assistance Sitting-balance support: Feet supported Sitting balance-Leahy Scale: Good     Standing balance support: Bilateral upper extremity supported, Reliant on assistive device for balance Standing balance-Leahy Scale: Fair                             ADL either performed or assessed with clinical judgement    Extremity/Trunk Assessment Upper Extremity Assessment Upper Extremity Assessment: Generalized weakness   Lower Extremity Assessment Lower Extremity Assessment: Generalized weakness        Vision Patient Visual Report: No change from baseline            Cognition Arousal/Alertness: Awake/alert Behavior During Therapy: Restless Overall Cognitive Status: Impaired/Different from baseline Area of Impairment: Orientation, Attention, Following commands, Safety/judgement, Awareness, Problem solving                 Orientation Level: Disoriented to, Place, Time, Situation Current Attention Level: Focused   Following Commands: Follows one step commands inconsistently, Follows one step commands with increased time Safety/Judgement: Decreased awareness of safety, Decreased awareness of deficits Awareness: Intellectual Problem Solving: Slow processing, Decreased initiation, Difficulty sequencing, Requires verbal cues, Requires tactile cues  Pertinent Vitals/ Pain       Pain Assessment Pain Assessment: No/denies pain         Frequency  Min 2X/week        Progress Toward Goals  OT Goals(current goals can now be found in the care plan section)  Progress towards  OT goals: Progressing toward goals  Acute Rehab OT Goals Patient Stated Goal: none stated OT Goal Formulation: Patient unable to participate in goal setting Time For Goal Achievement: 11/09/22 Potential to Achieve Goals: Fair  Plan Discharge plan remains appropriate;Frequency remains appropriate    Co-evaluation      Reason for Co-Treatment: For patient/therapist safety;To address functional/ADL transfers PT goals addressed during session: Mobility/safety with mobility OT goals addressed during session: Proper use of Adaptive equipment and DME      AM-PAC OT "6 Clicks" Daily Activity     Outcome Measure   Help from another person eating meals?: A Little Help from another person taking care of personal grooming?: A Little Help from another person toileting, which includes using toliet, bedpan, or urinal?: A Lot Help from another person bathing (including washing, rinsing, drying)?: A Lot Help from another person to put on and taking off regular upper body clothing?: A Lot Help from another person to put on and taking off regular lower body clothing?: A Lot 6 Click Score: 14    End of Session    OT Visit Diagnosis: Unsteadiness on feet (R26.81);Repeated falls (R29.6);Muscle weakness (generalized) (M62.81)   Activity Tolerance Patient tolerated treatment well   Patient Left in chair;with call bell/phone within reach;with chair alarm set   Nurse Communication Mobility status        Time: 9030-0923 OT Time Calculation (min): 14 min  Charges: OT General Charges $OT Visit: 1 Visit OT Treatments $Therapeutic Activity: 8-22 mins  Jackquline Denmark, MS, OTR/L , CBIS ascom [phone]  10/31/22, 1:35 PM

## 2022-10-31 NOTE — Progress Notes (Signed)
Progress Note   Patient: Ian Medina BFX:[id number] DOB: 1956/08/04 DOA: 10/24/2022     7 DOS: the patient was seen and examined on 10/31/2022   Brief hospital course: Yancy Knoble is a 66 y.o. male with medical history significant for schizoaffective disorder, mild intellectual disability, nicotine dependence, COPD, generalized anxiety disorder , hypertension, hepatitis C, group home resident with guardians through DSS.  He was brought into the ED on 10/24/2022 for evaluation of frequent falls, worsening cognitive decline and decrease in his ability to carry out his ADL's.  He had been seen in the ED twice recently for falls.  CT's of head, neck and face showed only a R zygomatic fracture.  He was found to have acute urinary retention at prior ED visit, a Foley catheter was placed.  Patient's legal guardian Ian Medina reports ongoing concern about patient's progressive decline which she describes as worsening confusion, frequent falls and inability to carry out his activities of daily living.  She also notes significant weight loss recently.   Patient was recently hospitalized from 09/24/22  through 09/28/22 for a similar presentation and at that time was found to have pneumonia and multiple rib fractures.  Per documentation his mental status improved and he was discharged to a SNF.  Pt admitted to the hospital for further evaluation and management.   11/30: afternoon pt febrile, tachycardic, meeting sepsis criteria. Code sepsis initiated.  Antibiotics started and further evaluation for source of infection underway.  UTI vs worsening pneumonia with probable aspiration.  Fluid resuscitation underway.  BP's low, but MAP so far holding > 65.  PCCM made aware, in case of need for pressors.  12/4: stopping fluids to monitor for dehydration and see if BP will sustain.  Required haldol for agitation despite thorazine.  12/5: had to resume fluids, remains hypotensive   Assessment and Plan: * Acute  metabolic encephalopathy Most likely related to worsening infection, progression of pneumonia likely aspiration, also with UTI.  Presented with progressively worsening mental status changes - very confused and oriented only to person (per guardian states is unusual for him).  Concern for possible dementia in this patient who has CT scan findings suggestive of encephalomalacia in the left temporal lobe. Ammonia, B12, RPR level normal. UDS positive only for tricyclics. Covid negative. UA bloody, rare bacteria, unable to check nitrites or leukocytes.  Urine culture growing Staph aureus and Enterococcus faecalis CXR with incomplete resolution of airspace disease in the right lung (recently had PNA, currently without respiratory symptoms). --Follow cultures --Continue antibiotics as outlined --Psych consulted to review medications --Delirium precautions  Hypotension Likely due to infections/sepsis. Attempted off fluids, but BP's remain low --Resume IV fluids --Maintain MAP>65 --Echo pending to assess cardiac function and rule out reduced cardiac output etiology  Urinary retention due to benign prostatic hyperplasia Noted to have urinary retention during his last ER visit and had a Foley catheter placed. Presented this time with gross hematuria, suspect urethral trauma from insertion vs pt pulling on the Foley. PSA level is normal. 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 Urology aware. --Continue Flomax --Follow up with urology  Schizoaffective disorder, depressive type (HCC)-resolved as of 10/28/2022 Patient is on multiple antipsychotic agents which include Cogentin, Thorazine, Prozac, Zyprexa, trazodone and Ingrezza 11/29 -- persistent agitation despite Haldol.  IM Geodon ordered and psych was updated. --Psych consulted on  admission --Meds continued --PRN  IM Haldol for agitation --Thorazine PRN at beginning of agitation  Aspiration pneumonia (HCC) Recent admission for PNA felt due to aspiration.  Noted to have progression of chronic dysphagia by SLP.  Became septic again on 11/30 and CXR showed worsening right-sided infiltrates.   12/3: recurrent fever 100.4 last night --See dysphagia --Initially treated w Cefepime --Now on Unasyn, continue --Aspiration precautions  Dysphagia Chronic since at least 2015, has progressed over time.  Higher admission seen by speech therapy and noted to have aspiration with both thin and thickened liquids.  Now with worsening recurrent right-sided pneumonia. -- SLP following --MBSS deferred due to construction, can be done outpatient --Tolerating pured diet with honey thickened liquids --Palliative care consulted for goals of care  Severe sepsis (HCC)-resolved as of 10/28/2022 11/30 afternoon: Febrile 101.3 F, tachycardic HR 115, and soft BP's persist.  Pt still encephalopathic as well, consistent with organ dysfunction and severe sepsis. 11/30 repeat chest x-ray showed worsening right-sided pneumonia -- Continue empiric Cefepime (cover for possible complicated UTI related to foley and urinary retention vs recurrent PNA) --Blood and urine culture pending --Lactic acid and procal are pending --Monitor fever curve, CBC  UTI (urinary tract infection) Foley had been placed for urinary retention in the ED a few days prior to this admission.  Due to AMS and cognition, unclear in urinary symptoms. Urine culture with staph aureus and enterococcus both sensitive to ampicillin. --Continue Unasyn for Uti and aspiration PNA --Foley replaced 12/2 after failed voiding trial   Schizoaffective disorder, bipolar type [facility]) Patient is on multiple antipsychotic agents which include Cogentin, Thorazine, Prozac, Zyprexa, trazodone and Ingrezza 11/29 -- persistent agitation despite Haldol.   IM Geodon ordered and psych was updated. --Psych consulted on admission --Meds continued --PRN IM Haldol for agitation --Thorazine PRN at beginning of agitation  Unintentional weight loss Per chart review, pt has had 9.5% wt loss over the past 6 months, which is not considered significant for time frame, but given malnutrition is concerning.   --Needs outpatient colonoscopy if due for this (no prior records available) --Monitor weights --See malnutrition  Protein-calorie malnutrition, severe Related to chronic illness (COPD) as evidenced by severe fat depletion, severe muscle depletion. --Appreciate dietitian recommendations --Started on Ensures, MVI --Monitor weight periodically --Encourage PO intake given intellectual disability and cognitive decline.   GERD (gastroesophageal reflux disease) Continue PPI  Cognitive decline Treatment as outlined in 1  Falls Patient presents for evaluation of multiple falls and was seen in the ER 2 days prior to this admission.  CT's during prior ED visit revealed partially covered segmental fracture of the right zygoma.  No acute intracranial or C-spine findings. --Fall precautions --PT evaluation  --Needs SNF/rehab, TOC following  Hypokalemia-resolved as of 10/28/2022 K 3.3 on 11/30, replaced. --will give 3 runs IV K-Cl with fluids today for K 3.5 --Monitor BMP        Subjective: Patient was up in recliner when seen this AM.  His BP has been low again, off fluids which are being resumed.  He denies coughing but nurses report dry cough this AM.  Pt says he feels good today.  Physical Exam: Vitals:   10/31/22 0345 10/31/22 0813 10/31/22 1014 10/31/22 1016  BP: 95/61 (!) 88/49 (!) 82/52 90/66  Pulse: 77 (!) 101 (!) 116 (!) 115  Resp: 17 18 18    Temp: 99.1 F (37.3 C) 98.5 F (36.9 C) 99.4 F (37.4 C)   TempSrc: Oral     SpO2: 98% 95% 94% 97%  Weight:      Height:       General exam: awake seated in recliner, more alert today,  no acute distress, underweight HEENT: moist mucus membranes, hearing grossly normal  Respiratory system: lungs clear today no wheezes or rhonchi, normal respiratory effort. Cardiovascular system: normal S1/S2, RRR, no pedal edema.   Gastrointestinal system: soft, nontender abdomen Genitourinary system: Foley in place  Central nervous system: Grossly nonfocal exam, speech is improved with mental status more alert Extremities: moves all, no edema, normal tone Psychiatry: Normal mood, no agitation or apparent hallucinations, abnormal judgment and insight   Data Reviewed:  Notable labs --- Cr 0.49, Ca 8.1, Hbg 8.6 from 8.4. Procal down-trended   11/29 Urine culture - grew Staph aureus and Enterococcus faecalis - both sensitive to ampicillin  11/30 chest x-ray showed worsening of right-sided pneumonia, highly suspicious for aspiration. IMPRESSION: Chronic emphysematous changes with chronic scarring within the lateral right mid lung. Increased opacification in this region compared to the most recent 10/24/2022 radiograph, with the current appearance more similar to pneumonia seen on prior 10/01/2022 and 09/24/2022 radiographs. This is favored to represent recurrence of pneumonia in this region.  11/30 Blood cultures negative, final.   Family Communication: Legal guardian updated in person 11/29, by phone 11/30, 12/1, 12/4.   Disposition: Status is: Inpatient Remains inpatient appropriate because:   --severity of illness --remains on IV antibiotics and other IV therapies as outlined --unable to return to group home, requires SNF placement.    Planned Discharge Destination: Skilled nursing facility     Time spent: 40 minutes  Author: Pennie Banter, DO 10/31/2022 1:10 PM  For on call review [email or web address].

## 2022-10-31 NOTE — Assessment & Plan Note (Addendum)
Likely due to infections/sepsis. Attempted off fluids, but BP's remain low --Resume IV fluids --Maintain MAP>65 --Echo pending to assess cardiac function and rule out reduced cardiac output etiology

## 2022-10-31 NOTE — Progress Notes (Signed)
Physical Therapy Treatment Patient Details Name: Ian Medina MRN: [id number] DOB: 02/15/1956 Today's Date: 10/31/2022   History of Present Illness 66 y/o male presented to ED on 10/22/22 for falls then d/c'ed and again on 10/23/22 for AMS. Foley placed for urinary retention. Admitted for acute metabolic encephalopathy. PMH: dementia, cerebral aneurysm, COPD, emphysema, HTN, schizoaffective disorder, schizophrenia    PT Comments    Overalp with OT this session for improved outcomes and safety.  Pt up in chair with nursing this am.  Pt awake and pleasantly confused.  He is wanting coffee and it is used as motivation to get pt up and walking this am.  He is taken into the hallway where he is able to stand with min a x 2 and holding IV pole is able to walk 67' with a turn before needing to sit.  Generally min a x 1 for balance but +2 is available for safety.  He does remain in chair after session.  Assist for shake and set up of lap belt posey alarm.  Discussed with RN  Recommendations for follow up therapy are one component of a multi-disciplinary discharge planning process, led by the attending physician.  Recommendations may be updated based on patient status, additional functional criteria and insurance authorization.  Follow Up Recommendations  Skilled nursing-short term rehab (<3 hours/day)     Assistance Recommended at Discharge Frequent or constant Supervision/Assistance  Patient can return home with the following A little help with walking and/or transfers;A little help with bathing/dressing/bathroom;Assist for transportation;Help with stairs or ramp for entrance;Direct supervision/assist for medications management;Direct supervision/assist for financial management;Assistance with feeding   Equipment Recommendations  Rolling walker (2 wheels)    Recommendations for Other Services       Precautions / Restrictions Precautions Precautions: Fall Restrictions Weight Bearing  Restrictions: No     Mobility  Bed Mobility               General bed mobility comments: in recliner upon arrival    Transfers Overall transfer level: Needs assistance Equipment used:  (pushes IV pole) Transfers: Sit to/from Stand Sit to Stand: Min assist, +2 safety/equipment                Ambulation/Gait Ambulation/Gait assistance: Min Web designer (Feet): 60 Feet Assistive device: IV Pole Gait Pattern/deviations: Step-through pattern, Trunk flexed, Decreased step length - right, Decreased step length - left Gait velocity: decreased     General Gait Details: short shuffling steps but only needs min +1 for balance but +2 used for safety   Stairs             Wheelchair Mobility    Modified Rankin (Stroke Patients Only)       Balance Overall balance assessment: Needs assistance Sitting-balance support: Feet supported Sitting balance-Leahy Scale: Good     Standing balance support: Bilateral upper extremity supported Standing balance-Leahy Scale: Fair                              Cognition Arousal/Alertness: Awake/alert Behavior During Therapy: Restless Overall Cognitive Status: Impaired/Different from baseline                                          Exercises      General Comments        Pertinent Vitals/Pain Pain Assessment  Pain Assessment: No/denies pain    Home Living                          Prior Function            PT Goals (current goals can now be found in the care plan section) Progress towards PT goals: Progressing toward goals    Frequency    Min 2X/week      PT Plan Current plan remains appropriate    Co-evaluation PT/OT/SLP Co-Evaluation/Treatment: Yes Reason for Co-Treatment: For patient/therapist safety PT goals addressed during session: Mobility/safety with mobility OT goals addressed during session: Proper use of Adaptive equipment and DME       AM-PAC PT "6 Clicks" Mobility   Outcome Measure  Help needed turning from your back to your side while in a flat bed without using bedrails?: None Help needed moving from lying on your back to sitting on the side of a flat bed without using bedrails?: A Little Help needed moving to and from a bed to a chair (including a wheelchair)?: A Little Help needed standing up from a chair using your arms (e.g., wheelchair or bedside chair)?: A Little Help needed to walk in hospital room?: A Little Help needed climbing 3-5 steps with a railing? : A Lot 6 Click Score: 18    End of Session Equipment Utilized During Treatment: Gait belt Activity Tolerance: Patient tolerated treatment well Patient left: in bed;with call bell/phone within reach;with bed alarm set Nurse Communication: Mobility status PT Visit Diagnosis: Unsteadiness on feet (R26.81);Repeated falls (R29.6);Muscle weakness (generalized) (M62.81);History of falling (Z91.81);Difficulty in walking, not elsewhere classified (R26.2)     Time: 0921-0930 PT Time Calculation (min) (ACUTE ONLY): 9 min  Charges:  $Gait Training: 8-22 mins                   Danielle Dess, PTA 10/31/22, 9:40 AM

## 2022-11-01 ENCOUNTER — Inpatient Hospital Stay (HOSPITAL_COMMUNITY)
Admit: 2022-11-01 | Discharge: 2022-11-01 | Disposition: A | Discharge status: Home / Self Care | Payer: 59 | Attending: Internal Medicine | Admitting: Internal Medicine

## 2022-11-01 DIAGNOSIS — E43 Unspecified severe protein-calorie malnutrition: Secondary | ICD-10-CM | POA: Diagnosis not present

## 2022-11-01 DIAGNOSIS — G9341 Metabolic encephalopathy: Secondary | ICD-10-CM | POA: Diagnosis not present

## 2022-11-01 DIAGNOSIS — R0609 Other forms of dyspnea: Secondary | ICD-10-CM | POA: Diagnosis not present

## 2022-11-01 DIAGNOSIS — F251 Schizoaffective disorder, depressive type: Secondary | ICD-10-CM | POA: Diagnosis not present

## 2022-11-01 DIAGNOSIS — A419 Sepsis, unspecified organism: Secondary | ICD-10-CM | POA: Diagnosis not present

## 2022-11-01 LAB — CBC
HCT: 27.9 % — ABNORMAL LOW (ref 39.0–52.0)
Hemoglobin: 8.8 g/dL — ABNORMAL LOW (ref 13.0–17.0)
MCH: 30.6 pg (ref 26.0–34.0)
MCHC: 31.5 g/dL (ref 30.0–36.0)
MCV: 96.9 fL (ref 80.0–100.0)
Platelets: 297 10*3/uL (ref 150–400)
RBC: 2.88 MIL/uL — ABNORMAL LOW (ref 4.22–5.81)
RDW: 15.6 % — ABNORMAL HIGH (ref 11.5–15.5)
WBC: 8.8 10*3/uL (ref 4.0–10.5)
nRBC: 0 % (ref 0.0–0.2)

## 2022-11-01 LAB — BASIC METABOLIC PANEL
Anion gap: 4 — ABNORMAL LOW (ref 5–15)
BUN: 8 mg/dL (ref 8–23)
CO2: 30 mmol/L (ref 22–32)
Calcium: 8.1 mg/dL — ABNORMAL LOW (ref 8.9–10.3)
Chloride: 105 mmol/L (ref 98–111)
Creatinine, Ser: 0.48 mg/dL — ABNORMAL LOW (ref 0.61–1.24)
GFR, Estimated: >60 mL/min (ref >60)
Glucose, Bld: 121 mg/dL — ABNORMAL HIGH (ref 70–99)
Potassium: 3.7 mmol/L (ref 3.5–5.1)
Sodium: 139 mmol/L (ref 135–145)

## 2022-11-01 LAB — MAGNESIUM: Magnesium: 2 mg/dL (ref 1.7–2.4)

## 2022-11-01 LAB — PHOSPHORUS: Phosphorus: 3.6 mg/dL (ref 2.5–4.6)

## 2022-11-01 LAB — ECHOCARDIOGRAM COMPLETE: S' Lateral: 2.7 cm

## 2022-11-01 NOTE — Progress Notes (Signed)
Nutrition Follow-up  DOCUMENTATION CODES:   Severe malnutrition in context of chronic illness  INTERVENTION:   -D/c Ensure Enlive po TID, each supplement provides 350 kcal and 20 grams of protein.  -No sugar added Mighty Shake (honey thickened) TID with meals, each supplement provides 200 kcals and 7 grams protein -Continue Magic cup TID with meals, each supplement provides 290 kcal and 9 grams of protein  -Continue MVI with minerals daily -Continue feeding assistance with meals  NUTRITION DIAGNOSIS:   Severe Malnutrition related to chronic illness (COPD) as evidenced by severe fat depletion, severe muscle depletion.  Ongoing  GOAL:   Patient will meet greater than or equal to 90% of their needs  Progressing   MONITOR:   PO intake, Supplement acceptance  REASON FOR ASSESSMENT:   Malnutrition Screening Tool    ASSESSMENT:   Pt with medical history significant for schizoaffective disorder, mild intellectual disability, nicotine dependence, COPD, generalized anxiety disorder , hypertension, hepatitis C, group home resident with guardians through DSS.  12/1- s/p BSE- severe aspiration risk, downgraded to dysphagia 1 diet with honey thick liquids  Reviewed I/O's: -2.9 L x 24 hours and -14.3 L since admission  UOP: 3 L x 24 hours   Pt lying in bed at time of visit. Noted a cup of coffee and multiple unopened juices on tray table.   Pt reports feeling better and improved appetite. He shares that he is eating well, however, no meal completions documented since 10/30/22. Pt very tangential but pleasant at time of visit. He repeatedly stated that he was feeling well and was happy to see me. He expressed gratitude for this RD caring for him.   No new wt since admission.   Per TOC notes, plan to d/c to SNF [facility]) in 1-2 days.   Medications reviewed and include keppra and lactated ringers with potassium chloride infusion @ 100 ml/hr.   Labs reviewed: CBGS:  93.  Diet Order:   Diet Order             DIET - DYS 1 Room service appropriate? Yes; Fluid consistency: Honey Thick  Diet effective now                   EDUCATION NEEDS:   Education needs have been addressed  Skin:  Skin Assessment: Reviewed RN Assessment  Last BM:  10/31/22  Height:   Ht Readings from Last 1 Encounters:  10/23/22 5\' 7"  (1.702 m)    Weight:   Wt Readings from Last 1 Encounters:  10/23/22 68.6 kg    Ideal Body Weight:  67.3 kg  BMI:  Body mass index is 23.69 kg/m.  Estimated Nutritional Needs:   Kcal:  2100-2300  Protein:  105-120 grams  Fluid:  > 2 L    10/25/22, RD, LDN, CDCES Registered Dietitian II Certified Diabetes Care and Education Specialist Please refer to [facility] for RD and/or RD on-call/weekend/after hours pager

## 2022-11-01 NOTE — Progress Notes (Signed)
Progress Note   Patient: Ian Medina UMP:[id number] DOB: [birth year] DOA: 10/24/2022     8 DOS: the patient was seen and examined on 11/01/2022   Brief hospital course: Ian Medina is a 66 y.o. male with medical history significant for schizoaffective disorder, mild intellectual disability, nicotine dependence, COPD, generalized anxiety disorder , hypertension, hepatitis C, group home resident with guardians through DSS.  He was brought into the ED on 10/24/2022 for evaluation of frequent falls, worsening cognitive decline and decrease in his ability to carry out his ADL's.  He had been seen in the ED twice recently for falls.  CT's of head, neck and face showed only a R zygomatic fracture.  He was found to have acute urinary retention at prior ED visit, a Foley catheter was placed.  Patient's legal guardian [name] reports ongoing concern about patient's progressive decline which she describes as worsening confusion, frequent falls and inability to carry out his activities of daily living.  She also notes significant weight loss recently.   After several days, patient developed severe sepsis felt to be secondary to urinary tract infection versus aspiration pneumonia.  Aggressively treated with fluids and antibiotics.  Improved although when fluids stopped, patient became more hypotensive requiring resumption of fluids.  Seen by physical therapy recommending skilled nursing.     Assessment and Plan: * Acute metabolic encephalopathy Most likely related to worsening infection, progression of pneumonia likely aspiration, also with UTI.  Presented with progressively worsening mental status changes - very confused and oriented only to person (per guardian states is unusual for him).  Concern for possible dementia in this patient who has CT scan findings suggestive of encephalomalacia in the left temporal lobe. Ammonia, B12, RPR level normal. UDS positive only for tricyclics. Covid negative. UA bloody,  rare bacteria, unable to check nitrites or leukocytes.  Urine culture growing Staph aureus and Enterococcus faecalis CXR with incomplete resolution of airspace disease in the right lung (recently had PNA, currently without respiratory symptoms). --Follow cultures --Continue antibiotics as outlined --Psych consulted to review medications --Delirium precautions  Hypotension Likely due to infections/sepsis. Attempted off fluids, but BP's remain low --Resume IV fluids --Maintain MAP>65 --Echo pending to assess cardiac function and rule out reduced cardiac output etiology  Urinary retention due to benign prostatic hyperplasia Noted to have urinary retention during his last ER visit and had a Foley catheter placed. Presented this time with gross hematuria, suspect urethral trauma from insertion vs pt pulling on the Foley. PSA level is normal. 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 Urology aware. --Continue Flomax --Follow up with urology  Schizoaffective disorder, depressive type (HCC)-resolved as of 10/28/2022 Patient is on multiple antipsychotic agents which include Cogentin, Thorazine, Prozac, Zyprexa, trazodone and Ingrezza 11/29 -- persistent agitation despite Haldol.  IM Geodon ordered and psych was updated. --Psych consulted on admission --Meds continued --PRN IM Haldol for agitation --Thorazine PRN at beginning of agitation  Aspiration pneumonia (HCC) Recent admission for PNA felt due to aspiration.  Noted to have progression of chronic dysphagia by SLP.  Became septic again on 11/30 and CXR showed worsening right-sided infiltrates.   12/3: recurrent fever 100.4 last night --See dysphagia --Initially treated w Cefepime --Now on Unasyn, which she seems to be responding better to. --Aspiration precautions  Dysphagia Chronic  since at least 2015, has progressed over time.  Higher admission seen by  speech therapy and noted to have aspiration with both thin and thickened liquids.  Now with worsening recurrent right-sided pneumonia. -- SLP following --MBSS deferred due to construction, can be done outpatient --Tolerating pured diet with honey thickened liquids --Palliative care consulted for goals of care  Severe sepsis (HCC)-resolved as of 10/28/2022 11/30 afternoon: Febrile 101.3 F, tachycardic HR 115, and soft BP's persist.  Pt still encephalopathic as well, consistent with organ dysfunction and severe sepsis. 11/30 repeat chest x-ray showed worsening right-sided pneumonia -- Continue empiric Cefepime (cover for possible complicated UTI related to foley and urinary retention vs recurrent PNA) --Blood and urine culture pending --Lactic acid and procal are pending --Monitor fever curve, CBC  UTI (urinary tract infection) Foley had been placed for urinary retention in the ED a few days prior to this admission.  Due to AMS and cognition, unclear in urinary symptoms. Urine culture with staph aureus and enterococcus both sensitive to ampicillin. --Continue Unasyn for Uti and aspiration PNA --Foley replaced 12/2 after failed voiding trial   Schizoaffective disorder, bipolar type [facility]) Patient is on multiple antipsychotic agents which include Cogentin, Thorazine, Prozac, Zyprexa, trazodone and Ingrezza 11/29 -- persistent agitation despite Haldol.  IM Geodon ordered and psych was updated. --Psych consulted on admission --Meds continued --PRN IM Haldol for agitation --Thorazine PRN at beginning of agitation  Unintentional weight loss Per chart review, pt has had 9.5% wt loss over the past 6 months, which is not considered significant for time frame, but given malnutrition is concerning.   --Needs outpatient colonoscopy if due for this (no prior records available) --Monitor weights --See  malnutrition  Protein-calorie malnutrition, severe Related to chronic illness (COPD) as evidenced by severe fat depletion, severe muscle depletion. --Appreciate dietitian recommendations --Started on Ensures, MVI --Monitor weight periodically --Encourage PO intake given intellectual disability and cognitive decline.   GERD (gastroesophageal reflux disease) Continue PPI  Cognitive decline Treatment as outlined in 1  Falls Patient presents for evaluation of multiple falls and was seen in the ER 2 days prior to this admission.  CT's during prior ED visit revealed partially covered segmental fracture of the right zygoma.  No acute intracranial or C-spine findings. --Fall precautions --PT evaluation  --Needs SNF/rehab, TOC following  Hypokalemia-resolved as of 10/28/2022 K 3.3 on 11/30, replaced. --will give 3 runs IV K-Cl with fluids today for K 3.5 --Monitor BMP        Subjective: No complaints  Physical Exam: Vitals:   10/31/22 1951 11/01/22 0611 11/01/22 0947 11/01/22 1709  BP: 90/61 90/72 (!) 97/57 (!) 88/57  Pulse: 96 85 95 75  Resp: 18 16 16 16   Temp: (!) 97.3 F (36.3 C) 97.8 F (36.6 C) 98 F (36.7 C) 98.4 F (36.9 C)  TempSrc:  Oral  Oral  SpO2: 96% 100% 96% 90%  Weight:      Height:       General exam: No acute distress HEENT: Mucous membranes moist Respiratory system: Clear to auscultation bilaterally Cardiovascular system: Regular rate and rhythm, S1-S2 Gastrointestinal system: Soft, nontender, nondistended, hypoactive bowel sounds Genitourinary system: Foley in place  Central nervous system: No focal deficits Extremities: No clubbing or cyanosis or edema Psychiatry: Appropriate, no evidence of psychoses   Data Reviewed: Hemoglobin today at 8.8, mild improvement from previous day  Family Communication: Legal guardian updated in person 11/29, by phone 11/30, 12/1, 12/4.   Disposition: Status is: Inpatient Remains inpatient appropriate  because:   --severity of illness --remains on IV antibiotics and other  IV therapies as outlined --unable to return to group home, requires SNF placement.    Planned Discharge Destination: Skilled nursing facility, also awaiting insurance approval    Author: Hollice Espy, MD 11/01/2022 6:54 PM  For on call review [email or web address].

## 2022-11-01 NOTE — Progress Notes (Signed)
*  PRELIMINARY RESULTS* Echocardiogram 2D Echocardiogram has been performed.  Lukasz, Rogus 11/01/2022, 9:13 AM

## 2022-11-01 NOTE — Progress Notes (Signed)
Physical Therapy Treatment Patient Details Name: Ian Medina MRN: [id number] DOB: Oct 18, 1956 Today's Date: 11/01/2022   History of Present Illness 66 y/o male presented to ED on 10/22/22 for falls then d/c'ed and again on 10/23/22 for AMS. Foley placed for urinary retention. Admitted for acute metabolic encephalopathy. PMH: dementia, cerebral aneurysm, COPD, emphysema, HTN, schizoaffective disorder, schizophrenia    PT Comments    Author responded to room with tele-sitter alarm going off and pt attempting OOB. He is confused and impulsive but did agree to ambulation/ OOB activity. Easily walked to doorway of room but once at doorway, quickly turned around and hurried back towards bed. He proceeded to climb over bed rail to get back into bed. Pt's cognition greatly impacts session progression. Will need 24/7 assistance at DC for safety. Recommend DC to SNF to maximize his independence with ADLs.    Recommendations for follow up therapy are one component of a multi-disciplinary discharge planning process, led by the attending physician.  Recommendations may be updated based on patient status, additional functional criteria and insurance authorization.  Follow Up Recommendations  Skilled nursing-short term rehab (<3 hours/day) (needs 24/7 assistance/supervision for safety)     Assistance Recommended at Discharge Frequent or constant Supervision/Assistance  Patient can return home with the following A little help with walking and/or transfers;A little help with bathing/dressing/bathroom;Assist for transportation;Help with stairs or ramp for entrance;Direct supervision/assist for medications management;Direct supervision/assist for financial management;Assistance with feeding   Equipment Recommendations  Rolling walker (2 wheels)       Precautions / Restrictions Precautions Precautions: Fall Restrictions Weight Bearing Restrictions: No     Mobility  Bed Mobility Overal bed mobility:  Needs Assistance Bed Mobility: Supine to Sit, Sit to Supine Rolling: Supervision Supine to sit: Supervision   Transfers Overall transfer level: Needs assistance Equipment used: None Transfers: Sit to/from Stand Sit to Stand: Min guard     Ambulation/Gait Ambulation/Gait assistance: Min guard, Min assist Gait Distance (Feet): 30 Feet Assistive device:  (gait belt) Gait Pattern/deviations: Step-through pattern, Trunk flexed, Decreased step length - right, Decreased step length - left Gait velocity: decreased     General Gait Details: pt tolerated ambulation to doorway of room. he turned around unexpectedly and walked back to EOB. climbed over bedrail to get back into bed.    Balance Overall balance assessment: Needs assistance Sitting-balance support: Feet supported Sitting balance-Leahy Scale: Good     Standing balance support: Bilateral upper extremity supported, Reliant on assistive device for balance Standing balance-Leahy Scale: Fair       Cognition Arousal/Alertness: Awake/alert Behavior During Therapy: Impulsive, Restless, Anxious Overall Cognitive Status: Impaired/Different from baseline Area of Impairment: Orientation, Attention, Following commands, Safety/judgement, Awareness, Problem solving   Orientation Level: Disoriented to, Place, Time, Situation     Following Commands: Follows one step commands inconsistently Safety/Judgement: Decreased awareness of safety, Decreased awareness of deficits   Problem Solving: Slow processing, Decreased initiation, Difficulty sequencing, Requires verbal cues, Requires tactile cues General Comments: Author responded to telesitter alarm going off. Pt remains confused and was ageeable to OOB activity               Pertinent Vitals/Pain Pain Assessment Pain Assessment: No/denies pain     PT Goals (current goals can now be found in the care plan section) Acute Rehab PT Goals Patient Stated Goal: none stated Progress  towards PT goals: Progressing toward goals    Frequency    Min 2X/week      PT Plan Current plan  remains appropriate    Co-evaluation     PT goals addressed during session: Mobility/safety with mobility;Balance;Proper use of DME;Strengthening/ROM        AM-PAC PT "6 Clicks" Mobility   Outcome Measure  Help needed turning from your back to your side while in a flat bed without using bedrails?: None Help needed moving from lying on your back to sitting on the side of a flat bed without using bedrails?: A Little Help needed moving to and from a bed to a chair (including a wheelchair)?: A Little Help needed standing up from a chair using your arms (e.g., wheelchair or bedside chair)?: A Little Help needed to walk in hospital room?: A Little Help needed climbing 3-5 steps with a railing? : A Lot 6 Click Score: 18    End of Session   Activity Tolerance: Patient tolerated treatment well Patient left: in bed;with call bell/phone within reach;with bed alarm set Nurse Communication: Mobility status PT Visit Diagnosis: Unsteadiness on feet (R26.81);Repeated falls (R29.6);Muscle weakness (generalized) (M62.81);History of falling (Z91.81);Difficulty in walking, not elsewhere classified (R26.2)     Time: 1350-1400 PT Time Calculation (min) (ACUTE ONLY): 10 min  Charges:  $Gait Training: 8-22 mins                     Julaine Fusi PTA 11/01/22, 4:03 PM

## 2022-11-02 DIAGNOSIS — A419 Sepsis, unspecified organism: Secondary | ICD-10-CM | POA: Diagnosis not present

## 2022-11-02 DIAGNOSIS — G9341 Metabolic encephalopathy: Secondary | ICD-10-CM | POA: Diagnosis not present

## 2022-11-02 DIAGNOSIS — F251 Schizoaffective disorder, depressive type: Secondary | ICD-10-CM | POA: Diagnosis not present

## 2022-11-02 DIAGNOSIS — E43 Unspecified severe protein-calorie malnutrition: Secondary | ICD-10-CM | POA: Diagnosis not present

## 2022-11-02 MED ORDER — MIDODRINE HCL 5 MG PO TABS
10.0000 mg | ORAL_TABLET | Freq: Three times a day (TID) | ORAL | Status: DC
  Administered 2022-11-02 – 2022-11-09 (×20): 10 mg via ORAL
  Filled 2022-11-02 (×20): qty 2

## 2022-11-02 NOTE — Progress Notes (Signed)
Progress Note   Patient: Ian Medina GQQ:[id number] DOB: [birth year] DOA: 10/24/2022     9 DOS: the patient was seen and examined on 11/02/2022   Brief hospital course: Ian Medina is a 66 y.o. male with medical history significant for schizoaffective disorder, mild intellectual disability, nicotine dependence, COPD, generalized anxiety disorder , hypertension, hepatitis C, group home resident with guardians through DSS.  He was brought into the ED on 10/24/2022 for evaluation of frequent falls, worsening cognitive decline and decrease in his ability to carry out his ADL's.  He had been seen in the ED twice recently for falls.  CT's of head, neck and face showed only a R zygomatic fracture.  He was found to have acute urinary retention at prior ED visit, a Foley catheter was placed.  Patient's legal guardian Ian Medina reports ongoing concern about patient's progressive decline which she describes as worsening confusion, frequent falls and inability to carry out his activities of daily living.  She also notes significant weight loss recently.   After several days, patient developed severe sepsis felt to be secondary to urinary tract infection versus aspiration pneumonia.  Aggressively treated with fluids and antibiotics.  Improved although when fluids stopped, patient became more hypotensive requiring resumption of fluids.  Seen by physical therapy who are recommending skilled nursing.     Assessment and Plan: * Acute metabolic encephalopathy Most likely related to worsening infection, progression of pneumonia likely aspiration, also with UTI.  Presented with progressively worsening mental status changes - very confused and oriented only to person (per guardian states is unusual for him).  Concern for possible dementia in this patient who has CT scan findings suggestive of encephalomalacia in the left temporal lobe. Ammonia, B12, RPR level normal. UDS positive only for tricyclics. Covid negative. UA  bloody, rare bacteria, unable to check nitrites or leukocytes.  Urine culture growing Staph aureus and Enterococcus faecalis CXR with incomplete resolution of airspace disease in the right lung (recently had PNA, currently without respiratory symptoms). --Follow cultures --Continue antibiotics as outlined --Psych consulted to review medications --Delirium precautions  Hypotension Likely due to infections/sepsis. Attempted off fluids, but BP's remain low --Resume IV fluids --Maintain MAP>65 --Echo unrevealing.  Hypotense episodes persisting.  No evidence of orthostasis.  Will go ahead and start midodrine.  Urinary retention due to benign prostatic hyperplasia Noted to have urinary retention during his last ER visit and had a Foley catheter placed. Presented this time with gross hematuria, suspect urethral trauma from insertion vs pt pulling on the Foley. PSA level is normal. 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 Urology aware. --Continue Flomax --Follow up with urology  Schizoaffective disorder, depressive type (HCC)-resolved as of 10/28/2022 Patient is on multiple antipsychotic agents which include Cogentin, Thorazine, Prozac, Zyprexa, trazodone and Ingrezza 11/29 -- persistent agitation despite Haldol.  IM Geodon ordered and psych was updated. --Psych consulted on admission --Meds continued --PRN IM Haldol for agitation --Thorazine PRN at beginning of agitation  Aspiration pneumonia (HCC) Recent admission for PNA felt due to aspiration.  Noted to have progression of chronic dysphagia by SLP.  Became septic again on 11/30 and CXR showed worsening right-sided infiltrates.   12/3: recurrent fever 100.4 last night --See dysphagia --Initially treated w Cefepime --Now on Unasyn, which she seems to be responding better  to. --Aspiration precautions  Dysphagia Chronic since at least 2015, has progressed  over time.  Higher admission seen by speech therapy and noted to have aspiration with both thin and thickened liquids.  Now with worsening recurrent right-sided pneumonia. -- SLP following --MBSS deferred due to construction, can be done outpatient --Tolerating pured diet with honey thickened liquids --Palliative care consulted for goals of care  Severe sepsis (HCC)-resolved as of 10/28/2022 11/30 afternoon: Febrile 101.3 F, tachycardic HR 115, and soft BP's persist.  Pt still encephalopathic as well, consistent with organ dysfunction and severe sepsis. 11/30 repeat chest x-ray showed worsening right-sided pneumonia -- Continue empiric Cefepime (cover for possible complicated UTI related to foley and urinary retention vs recurrent PNA) --Blood and urine culture pending --Lactic acid and procal are pending --Monitor fever curve, CBC  UTI (urinary tract infection) Foley had been placed for urinary retention in the ED a few days prior to this admission.  Due to AMS and cognition, unclear in urinary symptoms. Urine culture with staph aureus and enterococcus both sensitive to ampicillin. --Continue Unasyn for Uti and aspiration PNA --Foley replaced 12/2 after failed voiding trial   Schizoaffective disorder, bipolar type [facility]) Patient is on multiple antipsychotic agents which include Cogentin, Thorazine, Prozac, Zyprexa, trazodone and Ingrezza 11/29 -- persistent agitation despite Haldol.  IM Geodon ordered and psych was updated. --Psych consulted on admission --Meds continued --PRN IM Haldol for agitation --Thorazine PRN at beginning of agitation  Unintentional weight loss Per chart review, pt has had 9.5% wt loss over the past 6 months, which is not considered significant for time frame, but given malnutrition is concerning.   --Needs outpatient colonoscopy if due for this (no prior records  available) --Monitor weights --See malnutrition  Protein-calorie malnutrition, severe Related to chronic illness (COPD) as evidenced by severe fat depletion, severe muscle depletion. --Appreciate dietitian recommendations --Started on Ensures, MVI --Monitor weight periodically --Encourage PO intake given intellectual disability and cognitive decline.   GERD (gastroesophageal reflux disease) Continue PPI  Cognitive decline Treatment as outlined in 1  Falls Patient presents for evaluation of multiple falls and was seen in the ER 2 days prior to this admission.  CT's during prior ED visit revealed partially covered segmental fracture of the right zygoma.  No acute intracranial or C-spine findings. --Fall precautions --PT evaluation  --Needs SNF/rehab, TOC following  Hypokalemia-resolved as of 10/28/2022 K 3.3 on 11/30, replaced. --will give 3 runs IV K-Cl with fluids today for K 3.5 --Monitor BMP        Subjective: No complaints  Physical Exam: Vitals:   11/01/22 2111 11/02/22 0334 11/02/22 0727 11/02/22 1724  BP: (!) 89/48 (!) 88/55 (!) 110/59 115/82  Pulse: 73 74 (!) 110 96  Resp:  18 17 18   Temp: 98.3 F (36.8 C) 98.2 F (36.8 C) 97.8 F (36.6 C) 98.4 F (36.9 C)  TempSrc: Axillary Axillary Oral   SpO2: 97% 96% 92% 99%  Weight:      Height:       General exam: No acute distress HEENT: Mucous membranes moist Respiratory system: Clear to auscultation bilaterally Cardiovascular system: Regular rate and rhythm, S1-S2 Gastrointestinal system: Soft, nontender, nondistended, hypoactive bowel sounds Genitourinary system: Foley in place  Central nervous system: No focal deficits Extremities: No clubbing or cyanosis or edema Psychiatry: Appropriate, no evidence of psychoses   Data Reviewed: Echocardiogram noting preserved ejection fraction and indeterminate diastolic function  Family Communication: Legal guardian updated in person 11/29, by phone 11/30, 12/1,  12/4.   Disposition: Status is: Inpatient Remains inpatient appropriate because:   --severity  of illness --remains on IV antibiotics and other IV therapies as outlined --unable to return to group home, requires SNF placement.    Planned Discharge Destination: Skilled nursing facility, also awaiting insurance approval    Author: Hollice Espy, MD 11/02/2022 5:35 PM  For on call review [email or web address].

## 2022-11-03 LAB — CBC
HCT: 28.6 % — ABNORMAL LOW (ref 39.0–52.0)
Hemoglobin: 8.8 g/dL — ABNORMAL LOW (ref 13.0–17.0)
MCH: 30 pg (ref 26.0–34.0)
MCHC: 30.8 g/dL (ref 30.0–36.0)
MCV: 97.6 fL (ref 80.0–100.0)
Platelets: 342 10*3/uL (ref 150–400)
RBC: 2.93 MIL/uL — ABNORMAL LOW (ref 4.22–5.81)
RDW: 15.7 % — ABNORMAL HIGH (ref 11.5–15.5)
WBC: 7.4 10*3/uL (ref 4.0–10.5)
nRBC: 0 % (ref 0.0–0.2)

## 2022-11-03 LAB — BASIC METABOLIC PANEL
Anion gap: 6 (ref 5–15)
BUN: 7 mg/dL — ABNORMAL LOW (ref 8–23)
CO2: 29 mmol/L (ref 22–32)
Calcium: 8.4 mg/dL — ABNORMAL LOW (ref 8.9–10.3)
Chloride: 107 mmol/L (ref 98–111)
Creatinine, Ser: 0.53 mg/dL — ABNORMAL LOW (ref 0.61–1.24)
GFR, Estimated: >60 mL/min (ref >60)
Glucose, Bld: 107 mg/dL — ABNORMAL HIGH (ref 70–99)
Potassium: 4.2 mmol/L (ref 3.5–5.1)
Sodium: 142 mmol/L (ref 135–145)

## 2022-11-03 NOTE — Progress Notes (Signed)
Progress Note   Patient: Ian Medina [id number] DOB: 06/17/1956 DOA: 10/24/2022     10 DOS: the patient was seen and examined on 11/03/2022   Brief hospital course: Rael Yo is a 66 y.o. male with medical history significant for schizoaffective disorder, mild intellectual disability, nicotine dependence, COPD, generalized anxiety disorder , hypertension, hepatitis C, group home resident with guardians through DSS.  He was brought into the ED on 10/24/2022 for evaluation of frequent falls, worsening cognitive decline and decrease in his ability to carry out his ADL's.  He had been seen in the ED twice recently for falls.  CT's of head, neck and face showed only a R zygomatic fracture.  He was found to have acute urinary retention at prior ED visit, a Foley catheter was placed.  Patient's legal guardian [name] reports ongoing concern about patient's progressive decline which she describes as worsening confusion, frequent falls and inability to carry out his activities of daily living.  She also notes significant weight loss recently.   After several days, patient developed severe sepsis felt to be secondary to urinary tract infection versus aspiration pneumonia.  Aggressively treated with fluids and antibiotics.  Improved although when fluids stopped, patient became more hypotensive requiring resumption of fluids.  Seen by physical therapy who are recommending skilled nursing.     Assessment and Plan: * Acute metabolic encephalopathy Most likely related to worsening infection, progression of pneumonia likely aspiration, also with UTI.  Presented with progressively worsening mental status changes - very confused and oriented only to person (per guardian states is unusual for him).  Concern for possible dementia in this patient who has CT scan findings suggestive of encephalomalacia in the left temporal lobe. Ammonia, B12, RPR level normal. UDS positive only for tricyclics. Covid  negative. UA bloody, rare bacteria, unable to check nitrites or leukocytes.  Urine culture growing Staph aureus and Enterococcus faecalis CXR with incomplete resolution of airspace disease in the right lung (recently had PNA, currently without respiratory symptoms). --Follow cultures --Continue antibiotics as outlined --Psych consulted to review medications --Delirium precautions  Hypotension Likely due to infections/sepsis. Attempted off fluids, but BP's remain low -Have stopped IV fluids. --Maintain MAP>65 --Echo unrevealing.  Hypotense episodes persisting.  No evidence of orthostasis.  Will go ahead and start midodrine.  Improving although still having some episodes.  Urinary retention due to benign prostatic hyperplasia Noted to have urinary retention during his last ER visit and had a Foley catheter placed. Presented this time with gross hematuria, suspect urethral trauma from insertion vs pt pulling on the Foley. PSA level is normal. 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 Urology aware. --Continue Flomax --Follow up with urology  Schizoaffective disorder, depressive type (HCC)-resolved as of 10/28/2022 Patient is on multiple antipsychotic agents which include Cogentin, Thorazine, Prozac, Zyprexa, trazodone and Ingrezza 11/29 -- persistent agitation despite Haldol.  IM Geodon ordered and psych was updated. --Psych consulted on admission --Meds continued --PRN IM Haldol for agitation --Thorazine PRN at beginning of agitation  Aspiration pneumonia (HCC) Recent admission for PNA felt due to aspiration.  Noted to have progression of chronic dysphagia by SLP.  Became septic again on 11/30 and CXR showed worsening right-sided infiltrates.   12/3: recurrent fever 100.4 last night --See dysphagia --Initially treated w  Cefepime --Now on Unasyn, which she seems to be responding better to. --Aspiration precautions  Dysphagia Chronic since at least 2015, has progressed over time.  Higher admission seen by speech therapy and noted to have aspiration with both thin and thickened liquids.  Now with worsening recurrent right-sided pneumonia. -- SLP following --MBSS deferred due to construction, can be done outpatient --Tolerating pured diet with honey thickened liquids --Palliative care consulted for goals of care  Severe sepsis (HCC)-resolved as of 10/28/2022 11/30 afternoon: Febrile 101.3 F, tachycardic HR 115, and soft BP's persist.  Pt still encephalopathic as well, consistent with organ dysfunction and severe sepsis. 11/30 repeat chest x-ray showed worsening right-sided pneumonia -- Continue empiric Cefepime (cover for possible complicated UTI related to foley and urinary retention vs recurrent PNA) --Blood and urine culture pending --Lactic acid and procal are pending --Monitor fever curve, CBC  UTI (urinary tract infection) Foley had been placed for urinary retention in the ED a few days prior to this admission.  Due to AMS and cognition, unclear in urinary symptoms. Urine culture with staph aureus and enterococcus both sensitive to ampicillin. --Continue Unasyn for Uti and aspiration PNA --Foley replaced 12/2 after failed voiding trial   Schizoaffective disorder, bipolar type [facility]) Patient is on multiple antipsychotic agents which include Cogentin, Thorazine, Prozac, Zyprexa, trazodone and Ingrezza 11/29 -- persistent agitation despite Haldol.  IM Geodon ordered and psych was updated. --Psych consulted on admission --Meds continued --PRN IM Haldol for agitation --Thorazine PRN at beginning of agitation  Unintentional weight loss Per chart review, pt has had 9.5% wt loss over the past 6 months, which is not considered significant for time frame, but given malnutrition is concerning.   --Needs  outpatient colonoscopy if due for this (no prior records available) --Monitor weights --See malnutrition  Protein-calorie malnutrition, severe Related to chronic illness (COPD) as evidenced by severe fat depletion, severe muscle depletion. --Appreciate dietitian recommendations --Started on Ensures, MVI --Monitor weight periodically --Encourage PO intake given intellectual disability and cognitive decline.   GERD (gastroesophageal reflux disease) Continue PPI  Cognitive decline Treatment as outlined in 1  Falls Patient presents for evaluation of multiple falls and was seen in the ER 2 days prior to this admission.  CT's during prior ED visit revealed partially covered segmental fracture of the right zygoma.  No acute intracranial or C-spine findings. --Fall precautions --PT evaluation  --Needs SNF/rehab, TOC following  Hypokalemia-resolved as of 10/28/2022 K 3.3 on 11/30, replaced. --will give 3 runs IV K-Cl with fluids today for K 3.5 --Monitor BMP        Subjective: No complaints  Physical Exam: Vitals:   11/02/22 2006 11/03/22 0557 11/03/22 0735 11/03/22 1416  BP: 104/68 96/67 (!) 97/59 (!) 89/62  Pulse: 100 98 83 100  Resp: 17 17 17 17   Temp: 98.5 F (36.9 C) 98.3 F (36.8 C) 97.8 F (36.6 C) 98 F (36.7 C)  TempSrc: Oral Oral    SpO2: 96% 96% 98% 99%  Weight:      Height:       General exam: No acute distress HEENT: Mucous membranes moist Respiratory system: Clear to auscultation bilaterally Cardiovascular system: Regular rate and rhythm, S1-S2 Gastrointestinal system: Soft, nontender, nondistended, hypoactive bowel sounds Genitourinary system: Foley in place  Central nervous system: No focal deficits Extremities: No clubbing or cyanosis or edema Psychiatry: Appropriate, no evidence of psychoses   Data Reviewed: Echocardiogram noting preserved ejection fraction and indeterminate diastolic function  Family Communication: Legal guardian updated in  person 11/29, by phone 11/30, 12/1, 12/4.   Disposition: Status is: Inpatient Remains  inpatient appropriate because:   --severity of illness --remains on IV antibiotics and other IV therapies as outlined --unable to return to group home, requires SNF placement. Looking at discharging next week if blood pressure stable.    Planned Discharge Destination: Skilled nursing facility, also awaiting insurance approval    Author: Annita Brod, MD 11/03/2022 3:34 PM  For on call review [email or web address].

## 2022-11-03 NOTE — Progress Notes (Signed)
PT Cancellation Note  Patient Details Name: Ian Medina MRN: [id number] DOB: Oct 29, 1956   Cancelled Treatment:     PT attempt. Third attempt this date. Pt remains disoriented and unwilling to participate. Was observed get in/out of bed several times earlier in the day. Cognition/behaviors remains a barrier for progression with PT. We will continue our efforts to progress pt as able per current POC.    [name] 11/03/2022, 1:17 PM

## 2022-11-03 NOTE — Progress Notes (Signed)
Occupational Therapy Treatment Patient Details Name: Ian Medina MRN: [id number] DOB: 02-Jan-1956 Today's Date: 11/03/2022   History of present illness 66 y/o male presented to ED on 10/22/22 for falls then d/c'ed and again on 10/23/22 for AMS. Foley placed for urinary retention. Admitted for acute metabolic encephalopathy. PMH: dementia, cerebral aneurysm, COPD, emphysema, HTN, schizoaffective disorder, schizophrenia   OT comments  Mr. Nakata is initially reluctant to participate in therapy but, with encouragement, he agrees to get out of bed, ambulate, transfer to toilet, change clothing. He is impulsive in all OOB movements, requires reminders to wait for therapist or aide before transferring. He demonstrates no willingness to comply with these instructions. Pt attempts to perform peri-hygiene post toileting but is unable to perform task thoroughly, needs Min A for better hygiene. Pt not oriented to time, states that it is June. When told it is December, pt engages in extended conversation as to what he wants for Christmas and how he he would like to go Christmas shopping. Five minutes later, when asked about time/date, pt again states it is June, appears quite surprised to hear it is December. Pt able to sit EOB and maintain good balance w/o UE support. Will continue to offer acute OT, DC to SNF remains appropriate recommendation.   Recommendations for follow up therapy are one component of a multi-disciplinary discharge planning process, led by the attending physician.  Recommendations may be updated based on patient status, additional functional criteria and insurance authorization.    Follow Up Recommendations  Skilled nursing-short term rehab (<3 hours/day)     Assistance Recommended at Discharge Frequent or constant Supervision/Assistance  Patient can return home with the following  A little help with walking and/or transfers;A little help with bathing/dressing/bathroom;Help with stairs  or ramp for entrance;Assist for transportation;Direct supervision/assist for financial management;Direct supervision/assist for medications management   Equipment Recommendations       Recommendations for Other Services      Precautions / Restrictions Precautions Precautions: Fall Restrictions Weight Bearing Restrictions: No       Mobility Bed Mobility Overal bed mobility: Needs Assistance Bed Mobility: Supine to Sit, Sit to Supine     Supine to sit: Supervision Sit to supine: Supervision        Transfers Overall transfer level: Needs assistance Equipment used: 1 person hand held assist, None Transfers: Sit to/from Stand, Bed to chair/wheelchair/BSC Sit to Stand: Supervision     Step pivot transfers: Min guard           Balance Overall balance assessment: Needs assistance Sitting-balance support: Feet supported Sitting balance-Leahy Scale: Good Sitting balance - Comments: supervision for safety   Standing balance support: No upper extremity supported, During functional activity Standing balance-Leahy Scale: Fair Standing balance comment: one LOB during transition from [facility] to bed                           ADL either performed or assessed with clinical judgement   ADL Overall ADL's : Needs assistance/impaired Eating/Feeding: Supervision/ safety;Sitting                       Toilet Transfer: Supervision/safety;BSC/3in1   Toileting- Clothing Manipulation and Hygiene: Minimal assistance;Sit to/from stand         General ADL Comments: impulsive during OOB mobility    Extremity/Trunk Assessment Upper Extremity Assessment Upper Extremity Assessment: Generalized weakness   Lower Extremity Assessment Lower Extremity Assessment: Generalized weakness  Vision       Perception     Praxis      Cognition Arousal/Alertness: Awake/alert Behavior During Therapy: Impulsive, Restless, Anxious Overall Cognitive Status:  Impaired/Different from baseline                   Orientation Level: Disoriented to, Place, Time, Situation     Following Commands: Follows one step commands inconsistently     Problem Solving: Slow processing, Decreased initiation, Difficulty sequencing, Requires verbal cues, Requires tactile cues          Exercises Other Exercises Other Exercises: Educ re: waiting for assistance prior to transferring    Shoulder Instructions       General Comments      Pertinent Vitals/ Pain       Pain Assessment Pain Assessment: No/denies pain  Home Living                                          Prior Functioning/Environment              Frequency  Min 2X/week        Progress Toward Goals  OT Goals(current goals can now be found in the care plan section)  Progress towards OT goals: Progressing toward goals  Acute Rehab OT Goals OT Goal Formulation: Patient unable to participate in goal setting Time For Goal Achievement: 11/09/22 Potential to Achieve Goals: Fair  Plan Discharge plan remains appropriate;Frequency remains appropriate    Co-evaluation                 AM-PAC OT "6 Clicks" Daily Activity     Outcome Measure   Help from another person eating meals?: A Little Help from another person taking care of personal grooming?: A Little Help from another person toileting, which includes using toliet, bedpan, or urinal?: A Lot Help from another person bathing (including washing, rinsing, drying)?: A Lot Help from another person to put on and taking off regular upper body clothing?: A Lot Help from another person to put on and taking off regular lower body clothing?: A Lot 6 Click Score: 14    End of Session    OT Visit Diagnosis: Unsteadiness on feet (R26.81);Repeated falls (R29.6);Muscle weakness (generalized) (M62.81)   Activity Tolerance Patient tolerated treatment well   Patient Left in bed;with call bell/phone within  reach;with nursing/sitter in room   Nurse Communication Mobility status        Time: 5643-3295 OT Time Calculation (min): 23 min  Charges: OT General Charges $OT Visit: 1 Visit OT Treatments $Self Care/Home Management : 23-37 mins  Latina Craver, PhD, MS, OTR/L 11/03/22, 2:50 PM

## 2022-11-04 NOTE — Progress Notes (Signed)
Progress Note   Patient: Ian Medina FAO:[id number] DOB: 03-07-1956 DOA: 10/24/2022     11 DOS: the patient was seen and examined on 11/04/2022   Brief hospital course: Ian Medina is a 66 y.o. male with medical history significant for schizoaffective disorder, mild intellectual disability, nicotine dependence, COPD, generalized anxiety disorder , hypertension, hepatitis C, group home resident with guardians through DSS.  He was brought into the ED on 10/24/2022 for evaluation of frequent falls, worsening cognitive decline and decrease in his ability to carry out his ADL's.  He had been seen in the ED twice recently for falls.  CT's of head, neck and face showed only a R zygomatic fracture.  He was found to have acute urinary retention at prior ED visit, a Foley catheter was placed.  Patient's legal guardian Ian Medina reports ongoing concern about patient's progressive decline which she describes as worsening confusion, frequent falls and inability to carry out his activities of daily living.  She also notes significant weight loss recently.   After several days, patient developed severe sepsis felt to be secondary to urinary tract infection versus aspiration pneumonia.  Aggressively treated with fluids and antibiotics.  Improved although when fluids stopped, patient became more hypotensive requiring resumption of fluids.  Seen by physical therapy who are recommending skilled nursing.     Assessment and Plan: * Acute metabolic encephalopathy Most likely related to worsening infection, progression of pneumonia likely aspiration, also with UTI.  Presented with progressively worsening mental status changes - very confused and oriented only to person (per guardian states is unusual for him).  Concern for possible dementia in this patient who has CT scan findings suggestive of encephalomalacia in the left temporal lobe. Ammonia, B12, RPR level normal. UDS positive only for tricyclics. Covid  negative. UA bloody, rare bacteria, unable to check nitrites or leukocytes.  Urine culture growing Staph aureus and Enterococcus faecalis CXR with incomplete resolution of airspace disease in the right lung (recently had PNA, currently without respiratory symptoms). --Follow cultures --Continue antibiotics as outlined --Psych consulted to review medications --Delirium precautions  Hypotension Likely due to infections/sepsis. Attempted off fluids, but BP's remain low -Have stopped IV fluids. --Maintain MAP>65 --Echo unrevealing.  Hypotense episodes persisting.  No evidence of orthostasis.  Will go ahead and start midodrine.  Hypotension has since somewhat improved  Urinary retention due to benign prostatic hyperplasia Noted to have urinary retention during his last ER visit and had a Foley catheter placed. Presented this time with gross hematuria, suspect urethral trauma from insertion vs pt pulling on the Foley. PSA level is normal. 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 Urology aware. --Continue Flomax --Follow up with urology  Schizoaffective disorder, depressive type (HCC)-resolved as of 10/28/2022 Patient is on multiple antipsychotic agents which include Cogentin, Thorazine, Prozac, Zyprexa, trazodone and Ingrezza 11/29 -- persistent agitation despite Haldol.  IM Geodon ordered and psych was updated. --Psych consulted on admission --Meds continued --PRN IM Haldol for agitation --Thorazine PRN at beginning of agitation  Aspiration pneumonia (HCC)-resolved Recent admission for PNA felt due to aspiration.  Noted to have progression of chronic dysphagia by SLP.  Became septic again on 11/30 and CXR showed worsening right-sided infiltrates.   12/3: recurrent fever 100.4 last night --See dysphagia --Initially treated w  Cefepime --Now on Unasyn, which she seems to be responding better to. --Aspiration precautions  Dysphagia  Chronic since at least 2015, has progressed over time.  Higher admission seen by speech therapy and noted to have aspiration with both thin and thickened liquids.  Now with worsening recurrent right-sided pneumonia. -- SLP following --MBSS deferred due to construction, can be done outpatient --Tolerating pured diet with honey thickened liquids --Palliative care consulted for goals of care  Severe sepsis (HCC)-resolved as of 10/28/2022 11/30 afternoon: Febrile 101.3 F, tachycardic HR 115, and soft BP's persist.  Pt still encephalopathic as well, consistent with organ dysfunction and severe sepsis. 11/30 repeat chest x-ray showed worsening right-sided pneumonia -- Continue empiric Cefepime (cover for possible complicated UTI related to foley and urinary retention vs recurrent PNA) --Blood and urine culture pending --Lactic acid and procal are pending --Monitor fever curve, CBC  UTI (urinary tract infection) Foley had been placed for urinary retention in the ED a few days prior to this admission.  Due to AMS and cognition, unclear in urinary symptoms. Urine culture with staph aureus and enterococcus both sensitive to ampicillin. --Continue Unasyn for Uti and aspiration PNA --Foley replaced 12/2 after failed voiding trial   Schizoaffective disorder, bipolar type [facility]) Patient is on multiple antipsychotic agents which include Cogentin, Thorazine, Prozac, Zyprexa, trazodone and Ingrezza 11/29 -- persistent agitation despite Haldol.  IM Geodon ordered and psych was updated. --Psych consulted on admission --Meds continued --PRN IM Haldol for agitation --Thorazine PRN at beginning of agitation  Unintentional weight loss Per chart review, pt has had 9.5% wt loss over the past 6 months, which is not considered significant for time frame, but given malnutrition is concerning.   --Needs  outpatient colonoscopy if due for this (no prior records available) --Monitor weights --See malnutrition  Protein-calorie malnutrition, severe Related to chronic illness (COPD) as evidenced by severe fat depletion, severe muscle depletion. --Appreciate dietitian recommendations --Started on Ensures, MVI --Monitor weight periodically --Encourage PO intake given intellectual disability and cognitive decline.   GERD (gastroesophageal reflux disease) Continue PPI  Cognitive decline Treatment as outlined in 1  Falls Patient presents for evaluation of multiple falls and was seen in the ER 2 days prior to this admission.  CT's during prior ED visit revealed partially covered segmental fracture of the right zygoma.  No acute intracranial or C-spine findings. --Fall precautions --PT evaluation  --Needs SNF/rehab, TOC following  Hypokalemia-resolved as of 10/28/2022 K 3.3 on 11/30, replaced. --will give 3 runs IV K-Cl with fluids today for K 3.5 --Monitor BMP        Subjective: No complaints  Physical Exam: Vitals:   11/03/22 1416 11/03/22 1927 11/04/22 0341 11/04/22 1126  BP: (!) 89/62 (!) 95/58 (!) 93/59 91/68  Pulse: 100 99 81 90  Resp: 17 17 15 18   Temp: 98 F (36.7 C) 98.1 F (36.7 C) (!) 97.3 F (36.3 C) 98.1 F (36.7 C)  TempSrc:  Oral Axillary   SpO2: 99% 98% 97% 99%  Weight:      Height:       General exam: No acute distress HEENT: Mucous membranes moist Respiratory system: Clear to auscultation bilaterally Cardiovascular system: Regular rate and rhythm, S1-S2 Gastrointestinal system: Soft, nontender, nondistended, hypoactive bowel sounds Genitourinary system: Foley in place  Central nervous system: No focal deficits Extremities: No clubbing or cyanosis or edema Psychiatry: Appropriate, no evidence of psychoses   Data Reviewed: Echocardiogram noting preserved ejection fraction and indeterminate diastolic function  Family Communication: Legal guardian  updated in person 11/29, by phone 11/30, 12/1, 12/4.   Disposition: Status is: Inpatient  Remains inpatient appropriate because:   --severity of illness --remains on IV antibiotics and other IV therapies as outlined --unable to return to group home, requires SNF placement. Looking at discharging next week if blood pressure stable.    Planned Discharge Destination: Skilled nursing facility, also awaiting insurance approval    Author: Hollice Espy, MD 11/04/2022 1:51 PM  For on call review [email or web address].

## 2022-11-05 NOTE — Plan of Care (Signed)

## 2022-11-05 NOTE — Progress Notes (Signed)
Progress Note   Patient: Ian Medina UKG:[id number] DOB: [birth year] DOA: 10/24/2022     12 DOS: the patient was seen and examined on 11/05/2022   Brief hospital course: Ian Medina is a 66 y.o. male with medical history significant for schizoaffective disorder, mild intellectual disability, nicotine dependence, COPD, generalized anxiety disorder , hypertension, hepatitis C, group home resident with guardians through DSS.  He was brought into the ED on 10/24/2022 for evaluation of frequent falls, worsening cognitive decline and decrease in his ability to carry out his ADL's.  He had been seen in the ED twice recently for falls.  CT's of head, neck and face showed only a R zygomatic fracture.  He was found to have acute urinary retention at prior ED visit, a Foley catheter was placed.  Patient's legal guardian [name] reports ongoing concern about patient's progressive decline which she describes as worsening confusion, frequent falls and inability to carry out his activities of daily living.  She also notes significant weight loss recently.   After several days, patient developed severe sepsis felt to be secondary to urinary tract infection versus aspiration pneumonia.  Aggressively treated with fluids and antibiotics.  Improved although when fluids stopped, patient became more hypotensive requiring resumption of fluids.  Seen by physical therapy who are recommending skilled nursing.     Assessment and Plan: * Acute metabolic encephalopathy Most likely related to worsening infection, progression of pneumonia likely aspiration, also with UTI.  Presented with progressively worsening mental status changes - very confused and oriented only to person (per guardian states is unusual for him).  Concern for possible dementia in this patient who has CT scan findings suggestive of encephalomalacia in the left temporal lobe. Ammonia, B12, RPR level normal. UDS positive only for tricyclics. Covid  negative. UA bloody, rare bacteria, unable to check nitrites or leukocytes.  Urine culture growing Staph aureus and Enterococcus faecalis CXR with incomplete resolution of airspace disease in the right lung (recently had PNA, currently without respiratory symptoms). --Follow cultures --Continue antibiotics as outlined --Psych consulted to review medications --Delirium precautions  Hypotension Likely due to infections/sepsis. Attempted off fluids, but BP's remain low -Have stopped IV fluids. --Maintain MAP>65 --Echo unrevealing.  Hypotensive episodes persisting.  No evidence of orthostasis.  Midodrine started hypotension has since somewhat improved  Urinary retention due to benign prostatic hyperplasia Noted to have urinary retention during his last ER visit and had a Foley catheter placed. Presented this time with gross hematuria, suspect urethral trauma from insertion vs pt pulling on the Foley. PSA level is normal. 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 Urology aware. --Continue Flomax --Follow up with urology  Schizoaffective disorder, depressive type (HCC)-resolved as of 10/28/2022 Patient is on multiple antipsychotic agents which include Cogentin, Thorazine, Prozac, Zyprexa, trazodone and Ingrezza 11/29 -- persistent agitation despite Haldol.  IM Geodon ordered and psych was updated. --Psych consulted on admission --Meds continued --PRN IM Haldol for agitation --Thorazine PRN at beginning of agitation  Aspiration pneumonia (HCC)-resolved Recent admission for PNA felt due to aspiration.  Noted to have progression of chronic dysphagia by SLP.  Became septic again on 11/30 and CXR showed worsening right-sided infiltrates.   12/3: recurrent fever 100.4 last night --See dysphagia --Initially treated w Cefepime --Now on Unasyn,  which she seems to be responding better to. --Aspiration precautions  Dysphagia Chronic since at least 2015,  has progressed over time.  Higher admission seen by speech therapy and noted to have aspiration with both thin and thickened liquids.  Now with worsening recurrent right-sided pneumonia. -- SLP following --MBSS deferred due to construction, can be done outpatient --Tolerating pured diet with honey thickened liquids --Palliative care consulted for goals of care  Severe sepsis (HCC)-resolved as of 10/28/2022 11/30 afternoon: Febrile 101.3 F, tachycardic HR 115, and soft BP's persist.  Pt still encephalopathic as well, consistent with organ dysfunction and severe sepsis. 11/30 repeat chest x-ray showed worsening right-sided pneumonia -- Continue empiric Cefepime (cover for possible complicated UTI related to foley and urinary retention vs recurrent PNA) --Blood and urine culture pending --Lactic acid and procal are pending --Monitor fever curve, CBC  UTI (urinary tract infection) Foley had been placed for urinary retention in the ED a few days prior to this admission.  Due to AMS and cognition, unclear in urinary symptoms. Urine culture with staph aureus and enterococcus both sensitive to ampicillin. --Continue Unasyn for Uti and aspiration PNA --Foley replaced 12/2 after failed voiding trial   Schizoaffective disorder, bipolar type [facility]) Patient is on multiple antipsychotic agents which include Cogentin, Thorazine, Prozac, Zyprexa, trazodone and Ingrezza 11/29 -- persistent agitation despite Haldol.  IM Geodon ordered and psych was updated. --Psych consulted on admission --Meds continued --PRN IM Haldol for agitation --Thorazine PRN at beginning of agitation  Unintentional weight loss Per chart review, pt has had 9.5% wt loss over the past 6 months, which is not considered significant for time frame, but given malnutrition is concerning.   --Needs outpatient colonoscopy if due  for this (no prior records available) --Monitor weights --See malnutrition  Protein-calorie malnutrition, severe Related to chronic illness (COPD) as evidenced by severe fat depletion, severe muscle depletion. --Appreciate dietitian recommendations --Started on Ensures, MVI --Monitor weight periodically --Encourage PO intake given intellectual disability and cognitive decline.   GERD (gastroesophageal reflux disease) Continue PPI  Cognitive decline Treatment as outlined in 1  Falls Patient presents for evaluation of multiple falls and was seen in the ER 2 days prior to this admission.  CT's during prior ED visit revealed partially covered segmental fracture of the right zygoma.  No acute intracranial or C-spine findings. --Fall precautions --PT evaluation  --Needs SNF/rehab, TOC following  Hypokalemia-resolved as of 10/28/2022 K 3.3 on 11/30, replaced. --will give 3 runs IV K-Cl with fluids today for K 3.5 --Monitor BMP        Subjective: No complaints  Physical Exam: Vitals:   11/04/22 2220 11/05/22 0616 11/05/22 0819 11/05/22 1151  BP: (!) 87/61 100/72 90/63 107/76  Pulse: 69 72 76 (!) 126  Resp: 17 18 16 20   Temp: 98.1 F (36.7 C) 97.8 F (36.6 C) 98 F (36.7 C) 98.1 F (36.7 C)  TempSrc:  Oral    SpO2: 92% 94% 94% 96%  Weight:      Height:      About the same General exam: No acute distress HEENT: Mucous membranes moist Respiratory system: Clear to auscultation bilaterally Cardiovascular system: Regular rate and rhythm, S1-S2 Gastrointestinal system: Soft, nontender, nondistended, hypoactive bowel sounds Genitourinary system: Foley in place  Central nervous system: No focal deficits Extremities: No clubbing or cyanosis or edema Psychiatry: Appropriate, no evidence of psychoses   Data Reviewed: Echocardiogram noting preserved ejection fraction and indeterminate diastolic function  Family Communication: Legal guardian updated in person 11/29, by  phone 11/30, 12/1, 12/4.   Disposition: Status is: Inpatient Remains inpatient appropriate because:   --  severity of illness --remains on IV antibiotics and other IV therapies as outlined --unable to return to group home, requires SNF placement. Looking at discharging next week if blood pressure stable.    Planned Discharge Destination: Skilled nursing facility, also awaiting insurance approval    Author: Hollice Espy, MD 11/05/2022 12:24 PM  For on call review [email or web address].

## 2022-11-06 LAB — CBC
HCT: 33.5 % — ABNORMAL LOW (ref 39.0–52.0)
Hemoglobin: 10.6 g/dL — ABNORMAL LOW (ref 13.0–17.0)
MCH: 31 pg (ref 26.0–34.0)
MCHC: 31.6 g/dL (ref 30.0–36.0)
MCV: 98 fL (ref 80.0–100.0)
Platelets: 474 10*3/uL — ABNORMAL HIGH (ref 150–400)
RBC: 3.42 MIL/uL — ABNORMAL LOW (ref 4.22–5.81)
RDW: 15.8 % — ABNORMAL HIGH (ref 11.5–15.5)
WBC: 9.9 10*3/uL (ref 4.0–10.5)
nRBC: 0 % (ref 0.0–0.2)

## 2022-11-06 LAB — BASIC METABOLIC PANEL
Anion gap: 8 (ref 5–15)
BUN: 14 mg/dL (ref 8–23)
CO2: 30 mmol/L (ref 22–32)
Calcium: 8.9 mg/dL (ref 8.9–10.3)
Chloride: 101 mmol/L (ref 98–111)
Creatinine, Ser: 0.65 mg/dL (ref 0.61–1.24)
GFR, Estimated: >60 mL/min (ref >60)
Glucose, Bld: 115 mg/dL — ABNORMAL HIGH (ref 70–99)
Potassium: 4.2 mmol/L (ref 3.5–5.1)
Sodium: 139 mmol/L (ref 135–145)

## 2022-11-06 LAB — GLUCOSE, CAPILLARY: Glucose-Capillary: 143 mg/dL — ABNORMAL HIGH (ref 70–99)

## 2022-11-06 MED ORDER — ENOXAPARIN SODIUM 40 MG/0.4ML IJ SOSY
40.0000 mg | PREFILLED_SYRINGE | INTRAMUSCULAR | Status: DC
  Administered 2022-11-06 – 2022-11-08 (×3): 40 mg via SUBCUTANEOUS
  Filled 2022-11-06 (×3): qty 0.4

## 2022-11-06 NOTE — Progress Notes (Signed)
Physical Therapy Treatment Patient Details Name: Ian Medina MRN: [id number] DOB: [birth year] Today's Date: 11/06/2022   History of Present Illness Pt is a 66 y/o male presented to ED on 10/22/22 for falls then d/c'ed and again on 10/23/22 for AMS. Foley placed for urinary retention. Admitted for acute metabolic encephalopathy and hypotension with a history of falls. PMH: dementia, cerebral aneurysm, COPD, emphysema, HTN, schizoaffective disorder, schizophrenia    PT Comments    Pt required extensive encouragement to participate during the session. Pt refused any cuing for mobility initially but with encouragement and some redirection pt ultimately ambulated 2 x 30 feet with seated breaks between walks.  Pt was impulsive during the session with poor safety awareness and required occasional min A to prevent LOB during gait with no attempts to reach for furniture/walls in room to prevent himself from falling.  Pt is at a very high risk for falls and further functional decline and will benefit from a trial of PT services in a SNF setting upon discharge to safely address deficits listed in patient problem list for decreased caregiver assistance and eventual return to PLOF.    Recommendations for follow up therapy are one component of a multi-disciplinary discharge planning process, led by the attending physician.  Recommendations may be updated based on patient status, additional functional criteria and insurance authorization.  Follow Up Recommendations  Skilled nursing-short term rehab (<3 hours/day) Can patient physically be transported by private vehicle: No   Assistance Recommended at Discharge Frequent or constant Supervision/Assistance  Patient can return home with the following A little help with walking and/or transfers;A little help with bathing/dressing/bathroom;Assist for transportation;Help with stairs or ramp for entrance;Direct supervision/assist for medications management;Direct  supervision/assist for financial management;Assistance with feeding   Equipment Recommendations  Rolling walker (2 wheels)    Recommendations for Other Services       Precautions / Restrictions Precautions Precautions: Fall Restrictions Weight Bearing Restrictions: No     Mobility  Bed Mobility Overal bed mobility: Needs Assistance       Supine to sit: Supervision Sit to supine: Supervision   General bed mobility comments: Pt able to come to sitting with min extra time and effort but was highly impulsive when returning to supine attempting to crawl into bed on hands and knees; sequencing improved with extensive multi-modal cues during second attempt to return to bed    Transfers Overall transfer level: Needs assistance Equipment used: 1 person hand held assist Transfers: Sit to/from Stand Sit to Stand: Min guard           General transfer comment: Close CGA for patient safety seconday to impulsivity    Ambulation/Gait Ambulation/Gait assistance: Min assist Gait Distance (Feet): 30 Feet x 2 Assistive device: None Gait Pattern/deviations: Step-through pattern, Decreased step length - right, Decreased step length - left, Drifts right/left Gait velocity: decreased     General Gait Details: Min A for stability during ambulation with sometimes significant drifting left/right   Stairs             Wheelchair Mobility    Modified Rankin (Stroke Patients Only)       Balance Overall balance assessment: Needs assistance Sitting-balance support: Feet supported Sitting balance-Leahy Scale: Good     Standing balance support: No upper extremity supported, During functional activity Standing balance-Leahy Scale: Poor Standing balance comment: Min A for stability during ambulation  Cognition Arousal/Alertness: Awake/alert Behavior During Therapy: Impulsive, Restless Overall Cognitive Status: No family/caregiver present  to determine baseline cognitive functioning                                          Exercises      General Comments        Pertinent Vitals/Pain Pain Assessment Pain Assessment: No/denies pain    Home Living                          Prior Function            PT Goals (current goals can now be found in the care plan section) Progress towards PT goals: Progressing toward goals    Frequency    Min 2X/week      PT Plan Current plan remains appropriate    Co-evaluation              AM-PAC PT "6 Clicks" Mobility   Outcome Measure  Help needed turning from your back to your side while in a flat bed without using bedrails?: None Help needed moving from lying on your back to sitting on the side of a flat bed without using bedrails?: A Little Help needed moving to and from a bed to a chair (including a wheelchair)?: A Little Help needed standing up from a chair using your arms (e.g., wheelchair or bedside chair)?: A Little Help needed to walk in hospital room?: A Little Help needed climbing 3-5 steps with a railing? : A Lot 6 Click Score: 18    End of Session Equipment Utilized During Treatment: Gait belt Activity Tolerance: Patient tolerated treatment well Patient left: in bed;with call bell/phone within reach;with nursing/sitter in room Nurse Communication: Mobility status PT Visit Diagnosis: Unsteadiness on feet (R26.81);Repeated falls (R29.6);Muscle weakness (generalized) (M62.81);History of falling (Z91.81);Difficulty in walking, not elsewhere classified (R26.2)     Time: 2122-4825 PT Time Calculation (min) (ACUTE ONLY): 15 min  Charges:  $Gait Training: 8-22 mins                     D. Scott Siddhanth Denk PT, DPT 11/06/22, 3:00 PM

## 2022-11-06 NOTE — Progress Notes (Signed)
Occupational Therapy Treatment Patient Details Name: Ian Medina MRN: [id number] DOB: 1955-12-12 Today's Date: 11/06/2022   History of present illness 66 y/o male presented to ED on 10/22/22 for falls then d/c'ed and again on 10/23/22 for AMS. Foley placed for urinary retention. Admitted for acute metabolic encephalopathy. PMH: dementia, cerebral aneurysm, COPD, emphysema, HTN, schizoaffective disorder, schizophrenia   OT comments  Ian Medina participates eagerly in session; however, has difficulty following directions, frequently moves impulsively, and demonstrates instability in standing; he requires close SUPV for safety and repeated encouragement to continue with one task as a time. During today's session, pt is able to engage in bed mobility with Mod I, seated UE therex with ongoing cueing, and within room ambulation with hand-held assist and CGA. Pt is oriented to self only, he perseverates on "someone came in and stole my CD player." Am recommending adding this pt to mobility techs' caseload.   Recommendations for follow up therapy are one component of a multi-disciplinary discharge planning process, led by the attending physician.  Recommendations may be updated based on patient status, additional functional criteria and insurance authorization.    Follow Up Recommendations  Skilled nursing-short term rehab (<3 hours/day)     Assistance Recommended at Discharge Frequent or constant Supervision/Assistance  Patient can return home with the following  A little help with walking and/or transfers;A little help with bathing/dressing/bathroom;Help with stairs or ramp for entrance;Assist for transportation;Direct supervision/assist for financial management;Direct supervision/assist for medications management   Equipment Recommendations  None recommended by OT    Recommendations for Other Services      Precautions / Restrictions Precautions Precautions: Fall Restrictions Weight Bearing  Restrictions: No       Mobility Bed Mobility Overal bed mobility: Needs Assistance Bed Mobility: Supine to Sit, Sit to Supine     Supine to sit: Modified independent (Device/Increase time) Sit to supine: Modified independent (Device/Increase time)   General bed mobility comments: able to complete without physical assistance, although requires repeated encouragement to engage in activity    Transfers Overall transfer level: Needs assistance Equipment used: 1 person hand held assist Transfers: Sit to/from Stand Sit to Stand: Supervision           General transfer comment: close SUPV for safety, 2/2 impulsivity and instability in standing     Balance Overall balance assessment: Needs assistance Sitting-balance support: Feet supported Sitting balance-Leahy Scale: Good Sitting balance - Comments: supervision for safety   Standing balance support: No upper extremity supported, During functional activity, Single extremity supported Standing balance-Leahy Scale: Fair                             ADL either performed or assessed with clinical judgement   ADL                                              Extremity/Trunk Assessment Upper Extremity Assessment Upper Extremity Assessment: Generalized weakness   Lower Extremity Assessment Lower Extremity Assessment: Generalized weakness        Vision       Perception     Praxis      Cognition Arousal/Alertness: Awake/alert Behavior During Therapy: Impulsive, Restless Overall Cognitive Status: Impaired/Different from baseline                   Orientation Level: Disoriented  to, Place, Time, Situation     Following Commands: Follows one step commands inconsistently Safety/Judgement: Decreased awareness of safety, Decreased awareness of deficits   Problem Solving: Slow processing, Decreased initiation, Difficulty sequencing, Requires verbal cues, Requires tactile cues General  Comments: rambling, largely unintelligible speech        Exercises Other Exercises Other Exercises: UE therex in sitting, standing balance tolerance, cognitive re-orientation    Shoulder Instructions       General Comments      Pertinent Vitals/ Pain       Pain Assessment Pain Assessment: No/denies pain  Home Living                                          Prior Functioning/Environment              Frequency  Min 2X/week        Progress Toward Goals  OT Goals(current goals can now be found in the care plan section)  Progress towards OT goals: Progressing toward goals  Acute Rehab OT Goals OT Goal Formulation: Patient unable to participate in goal setting Time For Goal Achievement: 11/09/22 Potential to Achieve Goals: Fair  Plan Discharge plan remains appropriate;Frequency remains appropriate    Co-evaluation                 AM-PAC OT "6 Clicks" Daily Activity     Outcome Measure   Help from another person eating meals?: A Little Help from another person taking care of personal grooming?: A Little Help from another person toileting, which includes using toliet, bedpan, or urinal?: A Lot Help from another person bathing (including washing, rinsing, drying)?: A Lot Help from another person to put on and taking off regular upper body clothing?: A Lot Help from another person to put on and taking off regular lower body clothing?: A Lot 6 Click Score: 14    End of Session    OT Visit Diagnosis: Unsteadiness on feet (R26.81);Repeated falls (R29.6);Muscle weakness (generalized) (M62.81)   Activity Tolerance Patient tolerated treatment well   Patient Left in bed;with call bell/phone within reach;with nursing/sitter in room   Nurse Communication          Time: 6387-5643 OT Time Calculation (min): 11 min  Charges: OT General Charges $OT Visit: 1 Visit OT Treatments $Self Care/Home Management : 8-22 mins Latina Craver,  PhD, MS, OTR/L 11/06/22, 2:35 PM

## 2022-11-06 NOTE — Care Management Important Message (Signed)
Important Message  Patient Details  Name: Lynnwood Beckford MRN: [id number] Date of Birth: [birth year]   Medicare Important Message Given:  Other (see comment)  Left a message for the patients' LG [name] [phone]) to review the Important Message from Medicare. Will await a return call.    [name] A Elaura Calix 11/06/2022, 2:57 PM

## 2022-11-06 NOTE — Progress Notes (Signed)
Patient has IV fluids ordered. Made MD aware that patient did not have PIV. MD stated to get PIV placed but not to start fluids that are ordered at this time.

## 2022-11-06 NOTE — Progress Notes (Signed)
Progress Note   Patient: Ian Medina JJO:[id number] DOB: 05/24/1956 DOA: 10/24/2022     13 DOS: the patient was seen and examined on 11/06/2022   Brief hospital course: Bertrand Vowels is a 66 y.o. male with medical history significant for schizoaffective disorder, mild intellectual disability, nicotine dependence, COPD, generalized anxiety disorder , hypertension, hepatitis C, group home resident with guardians through DSS.  He was brought into the ED on 10/24/2022 for evaluation of frequent falls, worsening cognitive decline and decrease in his ability to carry out his ADL's.  He had been seen in the ED twice recently for falls.  CT's of head, neck and face showed only a R zygomatic fracture.  He was found to have acute urinary retention at prior ED visit, a Foley catheter was placed.  Patient's legal guardian [name] reports ongoing concern about patient's progressive decline which she describes as worsening confusion, frequent falls and inability to carry out his activities of daily living.  She also notes significant weight loss recently.   After several days, patient developed severe sepsis felt to be secondary to urinary tract infection versus aspiration pneumonia.  Aggressively treated with fluids and antibiotics.  Improved although when fluids stopped, patient became more hypotensive requiring resumption of fluids.  Seen by physical therapy who are recommending skilled nursing.     Assessment and Plan: * Acute metabolic encephalopathy Most likely related to worsening infection, progression of pneumonia likely aspiration, also with UTI.  Presented with progressively worsening mental status changes - very confused and oriented only to person (per guardian states is unusual for him).  Concern for possible dementia in this patient who has CT scan findings suggestive of encephalomalacia in the left temporal lobe. Ammonia, B12, RPR level normal. UDS positive only for tricyclics. Covid  negative. UA bloody, rare bacteria, unable to check nitrites or leukocytes.  Urine culture growing Staph aureus and Enterococcus faecalis CXR with incomplete resolution of airspace disease in the right lung (recently had PNA, currently without respiratory symptoms). --Follow cultures --Continue antibiotics as outlined --Psych consulted to review medications --Delirium precautions  Hypotension Likely due to infections/sepsis. Attempted off fluids, but BP's remain low -Have stopped IV fluids. --Maintain MAP>65 --Echo unrevealing.  Hypotensive episodes persisting.  No evidence of orthostasis.  Midodrine started hypotension has since somewhat improved. If patient can maintain stable blood pressures without fluids, can look at discharging to skilled nursing facility soon.  Urinary retention due to benign prostatic hyperplasia Noted to have urinary retention during his last ER visit and had a Foley catheter placed. Presented this time with gross hematuria, suspect urethral trauma from insertion vs pt pulling on the Foley. PSA level is normal. 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 Urology aware. --Continue Flomax --Follow up with urology  Schizoaffective disorder, depressive type (HCC)-resolved as of 10/28/2022 Patient is on multiple antipsychotic agents which include Cogentin, Thorazine, Prozac, Zyprexa, trazodone and Ingrezza 11/29 -- persistent agitation despite Haldol.  IM Geodon ordered and psych was updated. --Psych consulted on admission --Meds continued --PRN IM Haldol for agitation --Thorazine PRN at beginning of agitation  Aspiration pneumonia (HCC)-resolved Recent admission for PNA felt due to aspiration.  Noted to have progression of chronic dysphagia by SLP.  Became septic again on 11/30 and CXR showed worsening  right-sided infiltrates.   12/3: recurrent fever 100.4 last night --See dysphagia --Initially treated w Cefepime --Now on  Unasyn, which she seems to be responding better to. --Aspiration precautions  Dysphagia Chronic since at least 2015, has progressed over time.  Higher admission seen by speech therapy and noted to have aspiration with both thin and thickened liquids.  Now with worsening recurrent right-sided pneumonia. -- SLP following --MBSS deferred due to construction, can be done outpatient --Tolerating pured diet with honey thickened liquids --Palliative care consulted for goals of care  Severe sepsis (HCC)-resolved as of 10/28/2022 11/30 afternoon: Febrile 101.3 F, tachycardic HR 115, and soft BP's persist.  Pt still encephalopathic as well, consistent with organ dysfunction and severe sepsis. 11/30 repeat chest x-ray showed worsening right-sided pneumonia -- Continue empiric Cefepime (cover for possible complicated UTI related to foley and urinary retention vs recurrent PNA) --Blood and urine culture pending --Lactic acid and procal are pending --Monitor fever curve, CBC  UTI (urinary tract infection) Foley had been placed for urinary retention in the ED a few days prior to this admission.  Due to AMS and cognition, unclear in urinary symptoms. Urine culture with staph aureus and enterococcus both sensitive to ampicillin. --Continue Unasyn for Uti and aspiration PNA --Foley replaced 12/2 after failed voiding trial   Schizoaffective disorder, bipolar type [facility]) Patient is on multiple antipsychotic agents which include Cogentin, Thorazine, Prozac, Zyprexa, trazodone and Ingrezza 11/29 -- persistent agitation despite Haldol.  IM Geodon ordered and psych was updated. --Psych consulted on admission --Meds continued --PRN IM Haldol for agitation --Thorazine PRN at beginning of agitation  Unintentional weight loss Per chart review, pt has had 9.5% wt loss over the past 6  months, which is not considered significant for time frame, but given malnutrition is concerning.   --Needs outpatient colonoscopy if due for this (no prior records available) --Monitor weights --See malnutrition  Protein-calorie malnutrition, severe Related to chronic illness (COPD) as evidenced by severe fat depletion, severe muscle depletion. --Appreciate dietitian recommendations --Started on Ensures, MVI --Monitor weight periodically --Encourage PO intake given intellectual disability and cognitive decline.   GERD (gastroesophageal reflux disease) Continue PPI  Cognitive decline Treatment as outlined in 1  Falls Patient presents for evaluation of multiple falls and was seen in the ER 2 days prior to this admission.  CT's during prior ED visit revealed partially covered segmental fracture of the right zygoma.  No acute intracranial or C-spine findings. --Fall precautions --PT evaluation  --Needs SNF/rehab, TOC following  Hypokalemia-resolved as of 10/28/2022 K 3.3 on 11/30, replaced. --will give 3 runs IV K-Cl with fluids today for K 3.5 --Monitor BMP        Subjective: Patient with no acute plaints  Physical Exam: Vitals:   11/05/22 1151 11/05/22 1531 11/06/22 0556 11/06/22 0700  BP: 107/76 95/62 (!) 85/50 91/68  Pulse: (!) 126 99 84 87  Resp: 20 20 18 18   Temp: 98.1 F (36.7 C) 97.9 F (36.6 C) 98.3 F (36.8 C) 98.2 F (36.8 C)  TempSrc:    Oral  SpO2: 96% 95% 98% 94%  Weight:      Height:       General exam: No acute distress HEENT: Mucous membranes moist Respiratory system: Clear to auscultation bilaterally Cardiovascular system: Regular rate and rhythm, S1-S2 Gastrointestinal system: Soft, nontender, nondistended, hypoactive bowel sounds Genitourinary system: Foley in place  Central nervous system: No focal deficits Extremities: No clubbing or cyanosis or edema Psychiatry: Appropriate, no evidence of psychoses   Data Reviewed: Echocardiogram  noting preserved ejection fraction and indeterminate diastolic function  Family Communication: Legal guardian updated  in person 11/29, by phone 11/30, 12/1, 12/4.   Disposition: Status is: Inpatient Remains inpatient appropriate because:   --severity of illness --remains on IV antibiotics and other IV therapies as outlined --unable to return to group home, requires SNF placement. Looking at discharging later this week if blood pressure remains stable.    Planned Discharge Destination: Skilled nursing facility, also awaiting insurance approval    Author: Hollice Espy, MD 11/06/2022 3:34 PM  For on call review [email or web address].

## 2022-11-07 NOTE — Progress Notes (Signed)
Progress Note   Patient: Ian Medina VOZ:[id number] DOB: 1955/12/29 DOA: 10/24/2022     14 DOS: the patient was seen and examined on 11/07/2022   Brief hospital course: Ian Medina is a 66 y.o. male with medical history significant for schizoaffective disorder, mild intellectual disability, nicotine dependence, COPD, generalized anxiety disorder , hypertension, hepatitis C, group home resident with guardians through DSS.  He was brought into the ED on 10/24/2022 for evaluation of frequent falls, worsening cognitive decline and decrease in his ability to carry out his ADL's.  He had been seen in the ED twice recently for falls.  CT's of head, neck and face showed only a R zygomatic fracture.  He was found to have acute urinary retention at prior ED visit, a Foley catheter was placed.  Patient's legal guardian [name] reports ongoing concern about patient's progressive decline which she describes as worsening confusion, frequent falls and inability to carry out his activities of daily living.  She also notes significant weight loss recently.   After several days, patient developed severe sepsis felt to be secondary to urinary tract infection versus aspiration pneumonia.  Aggressively treated with fluids and antibiotics.  Improved although when fluids stopped, patient became more hypotensive requiring resumption of fluids.  Seen by physical therapy who are recommending skilled nursing.     Assessment and Plan: * Acute metabolic encephalopathy Most likely related to worsening infection, progression of pneumonia likely aspiration, also with UTI.  Presented with progressively worsening mental status changes - very confused and oriented only to person (per guardian states is unusual for him).  Concern for possible dementia in this patient who has CT scan findings suggestive of encephalomalacia in the left temporal lobe. Ammonia, B12, RPR level normal. UDS positive only for tricyclics. Covid  negative. UA bloody, rare bacteria, unable to check nitrites or leukocytes.  Urine culture growing Staph aureus and Enterococcus faecalis CXR with incomplete resolution of airspace disease in the right lung (recently had PNA, currently without respiratory symptoms). --Follow cultures --Continue antibiotics as outlined --Psych consulted to review medications --Delirium precautions  Hypotension Likely due to infections/sepsis. Attempted off fluids, but BP's remain low -Have stopped IV fluids. --Maintain MAP>65 --Echo unrevealing.  Hypotensive episodes persisting.  No evidence of orthostasis.  Midodrine started hypotension has since somewhat improved. If patient can maintain stable blood pressures without fluids, can look at discharging to skilled nursing facility soon.  Urinary retention due to benign prostatic hyperplasia Noted to have urinary retention during his last ER visit and had a Foley catheter placed. Presented this time with gross hematuria, suspect urethral trauma from insertion vs pt pulling on the Foley. PSA level is normal. 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 Urology aware. --Continue Flomax --Follow up with urology  Schizoaffective disorder, depressive type (HCC)-resolved as of 10/28/2022 Patient is on multiple antipsychotic agents which include Cogentin, Thorazine, Prozac, Zyprexa, trazodone and Ingrezza 11/29 -- persistent agitation despite Haldol.  IM Geodon ordered and psych was updated. --Psych consulted on admission --Meds continued --PRN IM Haldol for agitation --Thorazine PRN at beginning of agitation  Aspiration pneumonia (HCC)-resolved Recent admission for PNA felt due to aspiration.  Noted to have progression of chronic dysphagia by SLP.  Became septic again on 11/30 and CXR showed worsening  right-sided infiltrates.  --See dysphagia --Initially treated w Cefepime, and then changed to Unasyn and completed antibiotic course --  Aspiration precautions  Dysphagia Chronic since at least 2015, has progressed over time.  Higher admission seen by speech therapy and noted to have aspiration with both thin and thickened liquids.  Now with worsening recurrent right-sided pneumonia. -- SLP following --MBSS deferred due to construction, can be done outpatient --Tolerating pured diet with honey thickened liquids --Palliative care consulted for goals of care  Severe sepsis (HCC)-resolved as of 10/28/2022 11/30 afternoon: Febrile 101.3 F, tachycardic HR 115, and soft BP's persist.  Pt still encephalopathic as well, consistent with organ dysfunction and severe sepsis. 11/30 repeat chest x-ray showed worsening right-sided pneumonia    UTI (urinary tract infection) Foley had been placed for urinary retention in the ED a few days prior to this admission.  Due to AMS and cognition, unclear in urinary symptoms. Urine culture with staph aureus and enterococcus both sensitive to ampicillin. --on Unasyn for Uti and aspiration PNA-completed abx course --Foley replaced 12/2 after failed voiding trial   Schizoaffective disorder, bipolar type [facility]) Patient is on multiple antipsychotic agents which include Cogentin, Thorazine, Prozac, Zyprexa, trazodone and Ingrezza 11/29 -- persistent agitation despite Haldol.  IM Geodon ordered and psych was updated. --Psych consulted on admission --Meds continued --PRN IM Haldol for agitation --Thorazine PRN at beginning of agitation  Unintentional weight loss Per chart review, pt has had 9.5% wt loss over the past 6 months, which is not considered significant for time frame, but given malnutrition is concerning.   --Needs outpatient colonoscopy if due for this (no prior records available) --Monitor weights --See malnutrition  Protein-calorie malnutrition,  severe Related to chronic illness (COPD) as evidenced by severe fat depletion, severe muscle depletion. --Appreciate dietitian recommendations --Started on Ensures, MVI --Monitor weight periodically --Encourage PO intake given intellectual disability and cognitive decline.   GERD (gastroesophageal reflux disease) Continue PPI  Cognitive decline Treatment as outlined in 1  Falls Patient presents for evaluation of multiple falls and was seen in the ER 2 days prior to this admission.  CT's during prior ED visit revealed partially covered segmental fracture of the right zygoma.  No acute intracranial or C-spine findings. --Fall precautions --PT evaluation  --Needs SNF/rehab, TOC following  Hypokalemia-resolved as of 10/28/2022 K 3.3 on 11/30, replaced. --will give 3 runs IV K-Cl with fluids today for K 3.5 --Monitor BMP       Subjective: Doing okay, no acute complaints  Physical Exam: Vitals:   11/06/22 1956 11/07/22 0507 11/07/22 0510 11/07/22 0711  BP: 108/69 (!) 177/71 (!) 103/58 96/64  Pulse: 75 63 65 81  Resp: 19 18 18 16   Temp: 98.4 F (36.9 C)   97.9 F (36.6 C)  TempSrc: Oral   Oral  SpO2: 95% 98% 98% 100%  Weight:      Height:       General exam: No acute distress HEENT: Mucous membranes moist Respiratory system: Clear to auscultation bilaterally Cardiovascular system: Regular rate and rhythm, S1-S2 Gastrointestinal system: Soft, nontender, nondistended, hypoactive bowel sounds Genitourinary system: Foley in place  Central nervous system: No focal deficits Extremities: No clubbing or cyanosis or edema Psychiatry: Appropriate, no evidence of psychoses   Data Reviewed: Echocardiogram noting preserved ejection fraction and indeterminate diastolic function  Family Communication: Legal guardian updated in person 11/29, by phone 11/30, 12/1, 12/4, 12/12   Disposition: Status is: Inpatient Remains inpatient appropriate because:   --severity of  illness --remains on IV antibiotics and other IV therapies as outlined --unable to return to group home, requires SNF placement. Looking at  discharging later in the next 1 to 2 days if blood pressure remains stable.    Planned Discharge Destination: Skilled nursing facility, also awaiting insurance approval    Author: Hollice Espy, MD 11/07/2022 2:30 PM  For on call review [email or web address].

## 2022-11-07 NOTE — Care Management Important Message (Signed)
Important Message  Patient Details  Name: Ian Medina MRN: [id number] Date of Birth: 11-09-1956   Medicare Important Message Given:  Yes  I reviewed the Important Message from Medicare with Ferne Coe at [phone] and she stated she understood his rights. I thanked her for time and wished her a good afternoon.   [name] A Darneisha Windhorst 11/07/2022, 10:07 AM

## 2022-11-07 NOTE — Progress Notes (Signed)
Occupational Therapy Treatment Patient Details Name: Ian Medina MRN: [id number] DOB: January 17, 1956 Today's Date: 11/07/2022   History of present illness Pt is a 66 y/o male presented to ED on 10/22/22 for falls then d/c'ed and again on 10/23/22 for AMS. Foley placed for urinary retention. Admitted for acute metabolic encephalopathy and hypotension with a history of falls. PMH: dementia, cerebral aneurysm, COPD, emphysema, HTN, schizoaffective disorder, schizophrenia   OT comments  Upon entering session, pt resting in bed with sitter present. Pt was oriented to self only and required constant VC for redirection. Pt deferred OOB mobility this date despite Max encouragement. He was agreeable to bed level grooming tasks and BUE AROM exercises (see details below). Attempting to assist pt with repositioning in bed as he was leaning to R side, however, deferred at this time. Pt left as received with all needs in reach. D/C recommendation remains appropriate. OT will continue to follow acutely.     Recommendations for follow up therapy are one component of a multi-disciplinary discharge planning process, led by the attending physician.  Recommendations may be updated based on patient status, additional functional criteria and insurance authorization.    Follow Up Recommendations  Skilled nursing-short term rehab (<3 hours/day)     Assistance Recommended at Discharge Frequent or constant Supervision/Assistance  Patient can return home with the following  A little help with walking and/or transfers;A little help with bathing/dressing/bathroom;Help with stairs or ramp for entrance;Assist for transportation;Direct supervision/assist for financial management;Direct supervision/assist for medications management   Equipment Recommendations  None recommended by OT    Recommendations for Other Services      Precautions / Restrictions Precautions Precautions: Fall Restrictions Weight Bearing  Restrictions: No       Mobility Bed Mobility Overal bed mobility: Needs Assistance             General bed mobility comments: pt deferred    Transfers                         Balance Overall balance assessment: Needs assistance     Sitting balance - Comments: pt deferred                                   ADL either performed or assessed with clinical judgement   ADL       Grooming: Bed level;Set up;Wash/dry face;Supervision/safety                                 General ADL Comments: Pt deferred further grooming tasks and toileting    Extremity/Trunk Assessment Upper Extremity Assessment Upper Extremity Assessment: Generalized weakness   Lower Extremity Assessment Lower Extremity Assessment: Generalized weakness        Vision Patient Visual Report: No change from baseline     Perception     Praxis      Cognition Arousal/Alertness: Awake/alert Behavior During Therapy: Impulsive, Restless Overall Cognitive Status: No family/caregiver present to determine baseline cognitive functioning Area of Impairment: Orientation, Attention, Following commands, Safety/judgement, Awareness, Problem solving                 Orientation Level: Disoriented to, Place, Time, Situation Current Attention Level: Focused   Following Commands: Follows one step commands inconsistently Safety/Judgement: Decreased awareness of safety, Decreased awareness of deficits   Problem Solving: Slow processing, Decreased  initiation, Difficulty sequencing, Requires verbal cues, Requires tactile cues General Comments: O to self, stated he was home at first then able to pick correctly from two options that he is in the hospital, reported current year is 2002, constant VC for redirection        Exercises Other Exercises Other Exercises: BUE AROM exercises with Max multimodal cues including visual demonstration. Elbow flexion/extension, shoulder  horizontal adduction/abduction, and shoulder flexion x10 each.    Shoulder Instructions       General Comments      Pertinent Vitals/ Pain       Pain Assessment Pain Assessment: No/denies pain  Home Living                                          Prior Functioning/Environment              Frequency  Min 2X/week        Progress Toward Goals  OT Goals(current goals can now be found in the care plan section)  Progress towards OT goals: Progressing toward goals  Acute Rehab OT Goals Patient Stated Goal: none stated OT Goal Formulation: Patient unable to participate in goal setting Time For Goal Achievement: 11/09/22 Potential to Achieve Goals: Fair  Plan Discharge plan remains appropriate;Frequency remains appropriate    Co-evaluation                 AM-PAC OT "6 Clicks" Daily Activity     Outcome Measure   Help from another person eating meals?: A Little Help from another person taking care of personal grooming?: A Little Help from another person toileting, which includes using toliet, bedpan, or urinal?: A Lot Help from another person bathing (including washing, rinsing, drying)?: A Lot Help from another person to put on and taking off regular upper body clothing?: A Lot Help from another person to put on and taking off regular lower body clothing?: A Lot 6 Click Score: 14    End of Session    OT Visit Diagnosis: Unsteadiness on feet (R26.81);Repeated falls (R29.6);Muscle weakness (generalized) (M62.81)   Activity Tolerance Patient tolerated treatment well   Patient Left in bed;with call bell/phone within reach;with nursing/sitter in room   Nurse Communication Mobility status        Time: 8032-1224 OT Time Calculation (min): 13 min  Charges: OT General Charges $OT Visit: 1 Visit OT Treatments $Self Care/Home Management : 8-22 mins  [facility] MS, OTR/L ascom [phone]  11/07/22, 5:43 PM

## 2022-11-07 NOTE — Progress Notes (Signed)
CSW spoke with patient guardian, [name], who wanted patient to go back to [facility] in [place]. When CSW reached out to [facility] [employer], she stated that patient needed more memory care support and they don't have a bed available.  This information relayed to [place].  [name] asked that doctor call with update on patient.  CSW relayed information to Dr. Neoma Laming and he will call her this afternoon.  Patient to discharge to [facility] when medically ready.  [facility], LCSW [phone].

## 2022-11-07 NOTE — Plan of Care (Signed)

## 2022-11-08 ENCOUNTER — Ambulatory Visit: Payer: Self-pay | Admitting: Urology

## 2022-11-08 ENCOUNTER — Ambulatory Visit: Payer: Self-pay | Admitting: Physician Assistant

## 2022-11-08 DIAGNOSIS — G9341 Metabolic encephalopathy: Secondary | ICD-10-CM | POA: Diagnosis not present

## 2022-11-08 NOTE — Plan of Care (Signed)

## 2022-11-08 NOTE — TOC Progression Note (Addendum)
Transition of Care [facility]) - Progression Note    Patient Details  Name: Ian Medina MRN: [id number] Date of Birth: 28-Feb-1956  Transition of Care [facility]) CM/SW Contact  Tempie Hoist, [place] Phone Number: 11/08/2022, 3:34 PM  Clinical Narrative:      TOC received authorization from [facility]. TOC sent to SNF. Patient will need EMS to [facility].   Patient can transfer to SNF tomorrow.      Expected Discharge Plan and Services                          [facility]                       Social Determinants of Health (SDOH) Interventions    Readmission Risk Interventions     No data to display

## 2022-11-08 NOTE — Progress Notes (Signed)
Physical Therapy Treatment Patient Details Name: Ian Medina MRN: [id number] DOB: May 07, 1956 Today's Date: 11/08/2022   History of Present Illness Pt is a 66 y/o male presented to ED on 10/22/22 for falls then d/c'ed and again on 10/23/22 for AMS. Foley placed for urinary retention. Admitted for acute metabolic encephalopathy and hypotension with a history of falls. PMH: dementia, cerebral aneurysm, COPD, emphysema, HTN, schizoaffective disorder, schizophrenia    PT Comments    Pt was pleasant but required frequent encouragement to participate during the session. Pt was impulsive requiring frequent cuing for safe sequencing and redirection.  Pt ambulated with slow cadence and drifting left/right and required occasional min A for stability most notably during turns.  Pt reported no adverse symptoms during the session with SpO2 and HR WNL on room air.  Pt will benefit from PT services in a SNF setting upon discharge to safely address deficits listed in patient problem list for decreased caregiver assistance and eventual return to PLOF.       Recommendations for follow up therapy are one component of a multi-disciplinary discharge planning process, led by the attending physician.  Recommendations may be updated based on patient status, additional functional criteria and insurance authorization.  Follow Up Recommendations  Skilled nursing-short term rehab (<3 hours/day) Can patient physically be transported by private vehicle: No   Assistance Recommended at Discharge Frequent or constant Supervision/Assistance  Patient can return home with the following A little help with walking and/or transfers;A little help with bathing/dressing/bathroom;Assist for transportation;Help with stairs or ramp for entrance;Direct supervision/assist for medications management;Direct supervision/assist for financial management;Assistance with feeding   Equipment Recommendations  Rolling walker (2 wheels)     Recommendations for Other Services       Precautions / Restrictions Precautions Precautions: Fall Restrictions Weight Bearing Restrictions: No     Mobility  Bed Mobility Overal bed mobility: Needs Assistance Bed Mobility: Supine to Sit, Sit to Supine     Supine to sit: Supervision Sit to supine: Supervision   General bed mobility comments: Pt impulsive with bed mobility and required mod cuing for safe sequencing    Transfers Overall transfer level: Needs assistance Equipment used: None Transfers: Sit to/from Stand Sit to Stand: Min guard           General transfer comment: Close CGA for patient safety seconday to impulsivity    Ambulation/Gait Ambulation/Gait assistance: Min assist Gait Distance (Feet): 30 Feet x 1, 80 Feet x 1 Assistive device: None Gait Pattern/deviations: Step-through pattern, Decreased step length - right, Decreased step length - left, Drifts right/left Gait velocity: decreased     General Gait Details: Min A for stability during ambulation most notably during turns   Social research officer, government Rankin (Stroke Patients Only)       Balance Overall balance assessment: Needs assistance Sitting-balance support: Feet supported Sitting balance-Leahy Scale: Good     Standing balance support: No upper extremity supported, During functional activity Standing balance-Leahy Scale: Poor                              Cognition Arousal/Alertness: Awake/alert Behavior During Therapy: Impulsive, Restless Overall Cognitive Status: No family/caregiver present to determine baseline cognitive functioning  Exercises Total Joint Exercises Heel Slides: Strengthening, Both, 10 reps Straight Leg Raises: Strengthening, Both, 10 reps    General Comments        Pertinent Vitals/Pain Pain Assessment Pain Assessment: No/denies pain    Home  Living                          Prior Function            PT Goals (current goals can now be found in the care plan section) Progress towards PT goals: Progressing toward goals    Frequency    Min 2X/week      PT Plan Current plan remains appropriate    Co-evaluation              AM-PAC PT "6 Clicks" Mobility   Outcome Measure  Help needed turning from your back to your side while in a flat bed without using bedrails?: None Help needed moving from lying on your back to sitting on the side of a flat bed without using bedrails?: A Little Help needed moving to and from a bed to a chair (including a wheelchair)?: A Little Help needed standing up from a chair using your arms (e.g., wheelchair or bedside chair)?: A Little Help needed to walk in hospital room?: A Little Help needed climbing 3-5 steps with a railing? : A Lot 6 Click Score: 18    End of Session Equipment Utilized During Treatment: Gait belt Activity Tolerance: Patient tolerated treatment well Patient left: in bed;with call bell/phone within reach;with nursing/sitter in room Nurse Communication: Mobility status PT Visit Diagnosis: Unsteadiness on feet (R26.81);Repeated falls (R29.6);Muscle weakness (generalized) (M62.81);History of falling (Z91.81);Difficulty in walking, not elsewhere classified (R26.2)     Time: 9357-0177 PT Time Calculation (min) (ACUTE ONLY): 18 min  Charges:  $Gait Training: 8-22 mins                    D. Scott Amea Mcphail PT, DPT 11/08/22, 10:51 AM

## 2022-11-08 NOTE — Progress Notes (Signed)
PROGRESS NOTE    Ian Medina   ZOX:[id number] DOB: 04/24/1956  DOA: 10/24/2022 Date of Service: 11/08/22 PCP: Lorelee MarketPatrone, Michael, MD     Brief Narrative / Hospital Course:  Ian Medina is a 66 y.o. male with medical history significant for schizoaffective disorder, mild intellectual disability, nicotine dependence, COPD, generalized anxiety disorder , hypertension, hepatitis C, group home resident with guardians through DSS.  He was brought into the ED on 10/24/2022 for evaluation of frequent falls, worsening cognitive decline and decrease in his ability to carry out his ADL's.  He had been seen in the ED twice recently for falls.  CT's of head, neck and face showed only a R zygomatic fracture.  He was found to have acute urinary retention at prior ED visit, a Foley catheter was placed.  Patient's legal guardian Lyla SonCarrie reports ongoing concern about patient's progressive decline which she describes as worsening confusion, frequent falls and inability to carry out his activities of daily living.  She also notes significant weight loss recently.   SNF can take him tomorrow 11/09/22 if BP remains appropriate     Consultants:  Psychiatry  Palliative Care   Procedures: none      ASSESSMENT & PLAN:   Principal Problem:   Acute metabolic encephalopathy Active Problems:   Urinary retention due to benign prostatic hyperplasia   Hypotension   Dysphagia   Aspiration pneumonia (HCC)   Falls   Cognitive decline   GERD (gastroesophageal reflux disease)   Protein-calorie malnutrition, severe   Unintentional weight loss   Schizoaffective disorder, bipolar type (HCC)   UTI (urinary tract infection)   Acute metabolic encephalopathy Most likely related to infection d/t progression of pneumonia likely aspiration, also with UTI.   Concern for possible dementia in this patient who has CT scan findings suggestive of encephalomalacia in the left temporal lobe. Ammonia, B12, RPR level  normal. UDS positive only for tricyclics. Covid negative. UA bloody, rare bacteria, unable to check nitrites or leukocytes.   Urine culture growing Staph aureus and Enterococcus faecalis CXR with incomplete resolution of airspace disease in the right lung (recently had PNA, currently without respiratory symptoms). Antibiotics as outlined Psych consulted to review medications  Delirium precautions   Hypotension - resolved Likely due to infections/sepsis. Attempted off fluids, BP's initially low Have stopped IV fluids. Maintain MAP>65 Echo unrevealing.   No evidence of orthostasis.   Midodrine started hypotension has since somewhat improved. If patient can maintain stable blood pressures without fluids, can look at discharging to skilled nursing facility soon.   Urinary retention due to benign prostatic hyperplasia Noted to have urinary retention during his last ER visit and had a Foley catheter placed. Presented this time with gross hematuria, suspect urethral trauma from insertion vs pt pulling on the Foley. PSA level was normal. 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 Urology aware. Continue Flomax Follow up with urology   Schizoaffective disorder, depressive type (HCC) Schizoaffective disorder, bipolar type (HCC) Patient is on multiple antipsychotic agents which include Cogentin, Thorazine, Prozac, Zyprexa, trazodone and Ingrezza 11/29 -- persistent agitation despite Haldol.  IM Geodon ordered and psych was updated. Psych consulted on admission PRN IM Haldol for agitation Thorazine PRN at beginning of agitation  Aspiration pneumonia (HCC)-resolved Recent admission for PNA felt due to aspiration.  Noted to have progression of chronic dysphagia by SLP.  Became septic again  on 11/30 and CXR showed worsening right-sided  infiltrates.  See dysphagia Initially treated w Cefepime, and then changed to Unasyn and completed antibiotic course Aspiration precautions   Dysphagia Chronic since at least 2015, has progressed over time.  Prior admission seen by speech therapy and noted to have aspiration with both thin and thickened liquids.  Now with worsening recurrent right-sided pneumonia. SLP following MBSS deferred due to construction, can be done outpatient Tolerating pured diet with honey thickened liquids Palliative care consulted for goals of care   Severe sepsis (HCC)-resolved as of 10/28/2022 11/30 afternoon: Febrile 101.3 F, tachycardic HR 115, and soft BP's persist.  Pt still encephalopathic as well, consistent with organ dysfunction and severe sepsis d/t pneumonia and UTI. 11/30 repeat chest x-ray showed worsening right-sided pneumonia Treated w/ abx and IV fluids    UTI (urinary tract infection) Foley had been placed for urinary retention in the ED a few days prior to this admission.  Due to AMS and cognition, unclear in urinary symptoms. Urine culture with staph aureus and enterococcus both sensitive to ampicillin. on Unasyn for Uti and aspiration PNA-completed abx course Foley replaced 12/2 after failed voiding trial  Follow w/ urology outpatient    Unintentional weight loss Per chart review, pt has had 9.5% wt loss over the past 6 months, which is not considered significant for time frame, but given malnutrition is concerning.   Needs outpatient colonoscopy if due for this (no prior records available) Monitor weights See malnutrition   Protein-calorie malnutrition, severe Related to chronic illness (COPD) as evidenced by severe fat depletion, severe muscle depletion. Appreciate dietitian recommendations Started on Ensures, MVI Monitor weight periodically Encourage PO intake given intellectual disability and cognitive decline.  GERD (gastroesophageal reflux disease) Continue PPI    Cognitive decline Treatment as outlined in 1   Falls Patient presents for evaluation of multiple falls and was seen in the ER 2 days prior to this admission.  CT's during prior ED visit revealed partially covered segmental fracture of the right zygoma.  No acute intracranial or C-spine findings. Fall precautions PT evaluation  Needs SNF/rehab, TOC following   Hypokalemia-resolved as of 10/28/2022 K 3.3 on 11/30, replaced. Monitor BMP          DVT prophylaxis: lovenox  Pertinent IV fluids/nutrition: no continuous IV fluids  Central lines / invasive devices: Foley catheter   Code Status: FULL CODE   Disposition: inpatient  TOC needs: SNF placement  Barriers to discharge / significant pending items: unable to return to group home, requires SNF placement, BP have improved             Subjective:  Patient reports feeling great today  Denies CP/SOB.  Pain controlled.  Denies new weakness.  Tolerating diet.    Family Communication: no family involved     Objective Findings:  Vitals:   11/07/22 0711 11/07/22 1641 11/07/22 2021 11/08/22 1140  BP: 96/68  Pulse: 81 73 78 98  Resp: Temp: 97.9 F (36.6 C) 97.8 F (36.6 C) 98 F (36.7 C) 98.2 F (36.8 C)  TempSrc: Oral     SpO2: 100% 98% 97% 93%  Weight:      Height:       No intake or output data in the 24 hours ending 11/08/22 1657 Filed Weights   10/23/22 1955  Weight: 68.6 kg    Examination:  Constitutional:  VS as above General Appearance: alert, frail, NAD Respiratory: Normal respiratory  effort No wheeze No rhonchi No rales Cardiovascular: S1/S2 normal RRR No rub/gallop auscultated No lower extremity edema Gastrointestinal: No tenderness Musculoskeletal:  No clubbing/cyanosis of digits Symmetrical movement in all extremities Neurological: No cranial nerve deficit on limited exam Alert Psychiatric: Normal judgment/insight Pleasant mood and  affect       Scheduled Medications:   benztropine  0.5 mg Oral BID   Chlorhexidine Gluconate Cloth  6 each Topical Daily   chlorproMAZINE  25 mg Oral Daily   enoxaparin (LOVENOX) injection  40 mg Subcutaneous Q24H   FLUoxetine  20 mg Oral Daily   fluticasone furoate-vilanterol  1 puff Inhalation Daily   And   umeclidinium bromide  1 puff Inhalation Daily   gabapentin  100 mg Oral TID   levETIRAcetam  500 mg Oral BID   midodrine  10 mg Oral TID WC   montelukast  10 mg Oral QHS   multivitamin with minerals  1 tablet Oral Daily   OLANZapine  20 mg Oral QHS   pantoprazole  40 mg Oral Daily   pravastatin  20 mg Oral q1800   tamsulosin  0.4 mg Oral QPM   valbenazine  40 mg Oral Daily    Continuous Infusions:  lactated ringers 1,000 mL with potassium chloride 20 mEq infusion 10 mL/hr at 11/03/22 1735    PRN Medications:  acetaminophen **OR** acetaminophen, benzonatate, chlorproMAZINE **OR** chlorproMAZINE (THORAZINE) injection, ondansetron **OR** ondansetron (ZOFRAN) IV, traZODone  Antimicrobials:  Anti-infectives (From admission, onward)    Start     Dose/Rate Route Frequency Ordered Stop   10/28/22 1400  Ampicillin-Sulbactam (UNASYN) 3 g in sodium chloride 0.9 % 100 mL IVPB        3 g 200 mL/hr over 30 Minutes Intravenous Every 6 hours 10/28/22 1306 11/04/22 1114   10/27/22 0200  ceFEPIme (MAXIPIME) 2 g in sodium chloride 0.9 % 100 mL IVPB  Status:  Discontinued        2 g 200 mL/hr over 30 Minutes Intravenous Every 8 hours 10/26/22 1732 10/28/22 1304   10/26/22 1800  ceFEPIme (MAXIPIME) 2 g in sodium chloride 0.9 % 100 mL IVPB        2 g 200 mL/hr over 30 Minutes Intravenous  Once 10/26/22 1708 10/26/22 1811           Data Reviewed: I have personally reviewed following labs and imaging studies  CBC: Recent Labs  Lab 11/03/22 0402 11/06/22 0419  WBC 7.4 9.9  HGB 8.8* 10.6*  HCT 28.6* 33.5*  MCV 97.6 98.0  PLT 342 474*   Basic Metabolic Panel: Recent  Labs  Lab 11/03/22 0402 11/06/22 0419  NA 142 139  K 4.2 4.2  CL 107 101  CO2 29 30  GLUCOSE 107* 115*  BUN 7* 14  CREATININE 0.53* 0.65  CALCIUM 8.4* 8.9   GFR: Estimated Creatinine Clearance: 87.2 mL/min (by C-G formula based on SCr of 0.65 mg/dL). Liver Function Tests: No results for input(s): "AST", "ALT", "ALKPHOS", "BILITOT", "PROT", "ALBUMIN" in the last 168 hours. No results for input(s): "LIPASE", "AMYLASE" in the last 168 hours. No results for input(s): "AMMONIA" in the last 168 hours. Coagulation Profile: No results for input(s): "INR", "PROTIME" in the last 168 hours. Cardiac Enzymes: No results for input(s): "CKTOTAL", "CKMB", "CKMBINDEX", "TROPONINI" in the last 168 hours. BNP (last 3 results) No results for input(s): "PROBNP" in the last 8760 hours. HbA1C: No results for input(s): "HGBA1C" in the last 72 hours. CBG: Recent Labs  Lab 11/06/22 0913  GLUCAP 143*   Lipid Profile: No results for input(s): "CHOL", "HDL", "LDLCALC", "TRIG", "CHOLHDL", "LDLDIRECT" in the last 72 hours. Thyroid Function Tests: No results for input(s): "TSH", "T4TOTAL", "FREET4", "T3FREE", "THYROIDAB" in the last 72 hours. Anemia Panel: No results for input(s): "VITAMINB12", "FOLATE", "FERRITIN", "TIBC", "IRON", "RETICCTPCT" in the last 72 hours. Most Recent Urinalysis On File:     Component Value Date/Time   COLORURINE RED (A) 10/24/2022 0006   APPEARANCEUR CLOUDY (A) 10/24/2022 0006   LABSPEC 1.021 10/24/2022 0006   PHURINE  10/24/2022 0006    TEST NOT REPORTED DUE TO COLOR INTERFERENCE OF URINE PIGMENT   GLUCOSEU (A) 10/24/2022 0006    TEST NOT REPORTED DUE TO COLOR INTERFERENCE OF URINE PIGMENT   HGBUR (A) 10/24/2022 0006    TEST NOT REPORTED DUE TO COLOR INTERFERENCE OF URINE PIGMENT   BILIRUBINUR (A) 10/24/2022 0006    TEST NOT REPORTED DUE TO COLOR INTERFERENCE OF URINE PIGMENT   KETONESUR (A) 10/24/2022 0006    TEST NOT REPORTED DUE TO COLOR INTERFERENCE OF URINE  PIGMENT   PROTEINUR (A) 10/24/2022 0006    TEST NOT REPORTED DUE TO COLOR INTERFERENCE OF URINE PIGMENT   UROBILINOGEN 0.2 10/24/2010 1942   NITRITE (A) 10/24/2022 0006    TEST NOT REPORTED DUE TO COLOR INTERFERENCE OF URINE PIGMENT   LEUKOCYTESUR (A) 10/24/2022 0006    TEST NOT REPORTED DUE TO COLOR INTERFERENCE OF URINE PIGMENT   Sepsis Labs: @LABRCNTIP (procalcitonin:4,lacticidven:4)  No results found for this or any previous visit (from the past 240 hour(s)).       Radiology Studies: ECHOCARDIOGRAM COMPLETE  Result Date: 11/01/2022    ECHOCARDIOGRAM REPORT   Patient Name:   [facility] Date of Exam: 11/01/2022 Medical Rec #:  14/04/2022     Height:       67.0 in Accession #:    [id number]    Weight:       151.2 lb Date of Birth:  [birth year]      BSA:          1.796 m Patient Age:    66 years      BP:           90/72 mmHg Patient Gender: M             HR:           85 bpm. Exam Location:  [facility] Procedure: 2D Echo, Color Doppler and Cardiac Doppler Indications:     Dyspnea R06.00  History:         Patient has no prior history of Echocardiogram examinations.                  COPD; Risk Factors:Hypertension. Schizophrenia.  Sonographer:     01/31/1956 Referring Phys:  Cristela Blue KELLY A GRIFFITH Diagnosing Phys: [id number] MD  Sonographer Comments: Image acquisition challenging due to uncooperative patient and Image acquisition challenging due to patient behavioral factors. IMPRESSIONS  1. Left ventricular ejection fraction, by estimation, is 60 to 65%. The left ventricle has normal function. The left ventricle has no regional wall motion abnormalities. There is mild left ventricular hypertrophy. Left ventricular diastolic function could not be evaluated.  2. Right ventricular systolic function is normal. The right ventricular size is not well visualized.  3. The mitral valve is grossly normal. Trivial mitral valve regurgitation.  4. The aortic valve was not well visualized. Aortic valve  regurgitation is not visualized.  5. The inferior vena cava is normal in  size with greater than 50% respiratory variability, suggesting right atrial pressure of 3 mmHg. FINDINGS  Left Ventricle: Left ventricular ejection fraction, by estimation, is 60 to 65%. The left ventricle has normal function. The left ventricle has no regional wall motion abnormalities. The left ventricular internal cavity size was normal in size. There is  mild left ventricular hypertrophy. Left ventricular diastolic function could not be evaluated. Right Ventricle: The right ventricular size is not well visualized. No increase in right ventricular wall thickness. Right ventricular systolic function is normal. Left Atrium: Left atrial size was normal in size. Right Atrium: Right atrial size was normal in size. Pericardium: There is no evidence of pericardial effusion. Mitral Valve: The mitral valve is grossly normal. Trivial mitral valve regurgitation. Tricuspid Valve: The tricuspid valve is grossly normal. Tricuspid valve regurgitation is not demonstrated. Aortic Valve: The aortic valve was not well visualized. Aortic valve regurgitation is not visualized. Pulmonic Valve: The pulmonic valve was not well visualized. Pulmonic valve regurgitation is not visualized. Aorta: The aortic root is normal in size and structure. Venous: The inferior vena cava is normal in size with greater than 50% respiratory variability, suggesting right atrial pressure of 3 mmHg. IAS/Shunts: No atrial level shunt detected by color flow Doppler.  LEFT VENTRICLE PLAX 2D LVIDd:         4.30 cm LVIDs:         2.70 cm LV PW:         1.00 cm LV IVS:        1.30 cm LVOT diam:     2.00 cm LVOT Area:     3.14 cm  LEFT ATRIUM         Index LA diam:    3.60 cm 2.00 cm/m   AORTA Ao Root diam: 3.60 cm  SHUNTS Systemic Diam: 2.00 cm Debbe Odea MD Electronically signed by Debbe Odea MD Signature Date/Time: 11/01/2022/7:38:37 PM    Final             LOS: 15  days        Sunnie Nielsen, DO [facility] 11/08/2022, 4:57 PM    Dictation software may have been used to generate the above note. Typos may occur and escape review in typed/dictated notes. Please contact Dr Lyn Hollingshead directly for clarity if needed.  Staff may message me via secure chat in Epic  but this may not receive an immediate response,  please page me for urgent matters!  If 7PM-7AM, please contact night coverage [email or web address]

## 2022-11-08 NOTE — Progress Notes (Addendum)
Nutrition Follow-up  DOCUMENTATION CODES:   Severe malnutrition in context of chronic illness  INTERVENTION:   -Continue No sugar added Mighty Shake (honey thickened) TID with meals, each supplement provides 200 kcals and 7 grams protein -Continue Magic cup TID with meals, each supplement provides 290 kcal and 9 grams of protein  -Continue MVI with minerals daily -Continue feeding assistance with meals -Obtain new wt   NUTRITION DIAGNOSIS:   Severe Malnutrition related to chronic illness (COPD) as evidenced by severe fat depletion, severe muscle depletion.  Ongoing  GOAL:   Patient will meet greater than or equal to 90% of their needs  Progressing   MONITOR:   PO intake, Supplement acceptance  REASON FOR ASSESSMENT:   Malnutrition Screening Tool    ASSESSMENT:   Pt with medical history significant for schizoaffective disorder, mild intellectual disability, nicotine dependence, COPD, generalized anxiety disorder , hypertension, hepatitis C, group home resident with guardians through DSS.  12/1- s/p BSE- severe aspiration risk, downgraded to dysphagia 1 diet with honey thick liquids   Reviewed I/O's: -975 ml x 24 hours and -17.9 L since 10/25/22  UOP: 975 ml x 24 hours   Pt sitting up in bed at time of visit. He is pleasant and in good spirits today. Pt smiling and continues to tell RD that "I am happy to see you and I hope you're having a great day".   Pt reports good appetite. Noted pt consumed 100% of Magic Cup and Hormel Shake, but minimal pureed foods.   No new wts available to assess at this time.   Per TOC notes, plan to d/c to SNF once bed is available.   Medications reviewed and include keppra.   Labs reviewed: CBGS: 143.   NUTRITION - FOCUSED PHYSICAL EXAM:  Flowsheet Row Most Recent Value  Orbital Region Severe depletion  Upper Arm Region Severe depletion  Thoracic and Lumbar Region Severe depletion  Buccal Region Severe depletion  Temple  Region Severe depletion  Clavicle Bone Region Severe depletion  Clavicle and Acromion Bone Region Severe depletion  Scapular Bone Region Severe depletion  Dorsal Hand Severe depletion  Patellar Region Severe depletion  Anterior Thigh Region Severe depletion  Posterior Calf Region Severe depletion  Edema (RD Assessment) None  Hair Reviewed  Eyes Reviewed  Mouth Reviewed  Skin Reviewed  Nails Reviewed       Diet Order:   Diet Order             DIET - DYS 1 Room service appropriate? Yes; Fluid consistency: Honey Thick  Diet effective now                   EDUCATION NEEDS:   Education needs have been addressed  Skin:  Skin Assessment: Reviewed RN Assessment  Last BM:  11/04/22  Height:   Ht Readings from Last 1 Encounters:  10/23/22 5\' 7"  (1.702 m)    Weight:   Wt Readings from Last 1 Encounters:  10/23/22 68.6 kg    Ideal Body Weight:  67.3 kg  BMI:  Body mass index is 23.69 kg/m.  Estimated Nutritional Needs:   Kcal:  2100-2300  Protein:  105-120 grams  Fluid:  > 2 L    10/25/22, RD, LDN, CDCES Registered Dietitian II Certified Diabetes Care and Education Specialist Please refer to [facility] for RD and/or RD on-call/weekend/after hours pager

## 2022-11-09 DIAGNOSIS — G9341 Metabolic encephalopathy: Secondary | ICD-10-CM | POA: Diagnosis not present

## 2022-11-09 MED ORDER — BENZTROPINE MESYLATE 0.5 MG PO TABS: 0.5000 mg | ORAL_TABLET | Freq: Two times a day (BID) | ORAL | 0 refills | Status: AC

## 2022-11-09 MED ORDER — TAMSULOSIN HCL 0.4 MG PO CAPS: 0.4000 mg | ORAL_CAPSULE | Freq: Every evening | ORAL | 0 refills | Status: DC

## 2022-11-09 MED ORDER — MIDODRINE HCL 10 MG PO TABS: 10.0000 mg | ORAL_TABLET | Freq: Three times a day (TID) | ORAL | 0 refills | Status: AC

## 2022-11-09 MED ORDER — CHLORPROMAZINE HCL 25 MG PO TABS: 25.0000 mg | ORAL_TABLET | Freq: Every evening | ORAL | 0 refills | Status: DC | PRN

## 2022-11-09 MED ORDER — CHLORPROMAZINE HCL 25 MG PO TABS: 25.0000 mg | ORAL_TABLET | Freq: Every day | ORAL | 0 refills | Status: DC

## 2022-11-09 MED ORDER — ADULT MULTIVITAMIN W/MINERALS CH: 1.0000 | ORAL_TABLET | Freq: Every day | ORAL | Status: AC

## 2022-11-09 NOTE — Plan of Care (Signed)

## 2022-11-09 NOTE — TOC Transition Note (Signed)
Transition of Care [facility]) - CM/SW Discharge Note   Patient Details  Name: Raif Chachere MRN: [id number] Date of Birth: [birth year]  [facility] CM/SW Contact:  Colin Broach, LCSW Phone Number: 11/09/2022, 11:32 AM   Clinical Narrative:   Patient discharge orders in chart.  [facility] called for transport to [facility].  Patient guardian, [name], notitifed by CSW of discharge.    Call report:  [phone].    Final next level of care: Skilled Nursing Facility Barriers to Discharge: No Barriers Identified   Patient Goals and CMS Choice Patient states their goals for this hospitalization and ongoing recovery are:: to return home CMS Medicare.gov Compare Post Acute Care list provided to:: Patient Choice offered to / list presented to : Patient  Discharge Placement              Patient [facility]: [facility] Patient to be transferred to facility by: [facility] Name of family member notified: Patient guardian, [name], notified by phone Patient and family notified of of transfer: 11/09/22  Discharge Plan and Services                                     Social Determinants of Health (SDOH) Interventions     Readmission Risk Interventions     No data to display

## 2022-11-09 NOTE — Discharge Summary (Addendum)
Physician Discharge Summary   Patient: Ian Medina MRN: [id number]  DOB: 1956-01-03   Admit:     Date of Admission: 10/24/2022 Admitted from: group home    Discharge: Date of discharge: 11/09/22 Disposition: Skilled nursing facility Condition at discharge: fair  CODE STATUS: FULL CODE      Discharge Physician: Sunnie Nielsen, DO [facility]     PCP: Lorelee Market, MD  Recommendations for Outpatient Follow-up:  Follow up with PCP Lorelee Market, MD in 1-2 weeks Please ensure follow up w/ GI re: colonoscopy for weight loss / FTT, barium swallow study for dysphagia +/- EGD.  Please ensure follow up with Urology re: urinary retention and Foley cath exchange in 1 week Please ensure follow up w/ psychiatry  Please obtain labs/tests: CBC, CMP in 2-4 weeks Please follow up on the following pending results: none        Discharge Diagnoses: Principal Problem:   Acute metabolic encephalopathy Active Problems:   Urinary retention due to benign prostatic hyperplasia   Hypotension   Dysphagia   Aspiration pneumonia (HCC)   Falls   Cognitive decline   GERD (gastroesophageal reflux disease)   Protein-calorie malnutrition, severe   Unintentional weight loss   Schizoaffective disorder, bipolar type (HCC)   UTI (urinary tract infection)       Hospital Course: Scotty Weigelt is a 66 y.o. male with medical history significant for schizoaffective disorder, mild intellectual disability, nicotine dependence, COPD, generalized anxiety disorder , hypertension, hepatitis C, group home resident with guardians through DSS.  He was brought into the ED on 10/24/2022 for evaluation of frequent falls, worsening cognitive decline and decrease in his ability to carry out his ADL's.  He had been seen in the ED twice recently for falls.  CT's of head, neck and face showed only a R zygomatic fracture.  He was found to have acute urinary retention at prior ED visit, a Foley  catheter was placed.  Patient's legal guardian [name] reports ongoing concern about patient's progressive decline which she describes as worsening confusion, frequent falls and inability to carry out his activities of daily living.  She also notes significant weight loss recently.   SNF can take him 11/09/22 if BP remains appropriate     Consultants:  Psychiatry  Palliative Care   Procedures: none      ASSESSMENT & PLAN:   Principal Problem:   Acute metabolic encephalopathy Active Problems:   Urinary retention due to benign prostatic hyperplasia   Hypotension   Dysphagia   Aspiration pneumonia (HCC)   Falls   Cognitive decline   GERD (gastroesophageal reflux disease)   Protein-calorie malnutrition, severe   Unintentional weight loss   Schizoaffective disorder, bipolar type (HCC)   UTI (urinary tract infection)   Acute metabolic encephalopathy - resolved Most likely related to infection d/t progression of pneumonia likely aspiration, also with UTI.   Concern for possible dementia in this patient who has CT scan findings suggestive of encephalomalacia in the left temporal lobe. Ammonia, B12, RPR level normal. UDS positive only for tricyclics. Covid negative. UA bloody, rare bacteria, unable to check nitrites or leukocytes.   Urine culture growing Staph aureus and Enterococcus faecalis CXR with incomplete resolution of airspace disease in the right lung (recently had PNA, currently without respiratory symptoms). Antibiotics as outlined Psych consulted to review medications - see below Delirium precautions   Hypotension - resolved Likely due to infections/sepsis. Attempted off fluids, BP's initially low Have stopped IV fluids. Maintain  MAP>65 Echo unrevealing.   No evidence of orthostasis.   Midodrine started hypotension has since somewhat improved. If patient can maintain stable blood pressures without fluids, can look at discharging to skilled nursing facility  soon.   Urinary retention due to benign prostatic hyperplasia Noted to have urinary retention during his last ER visit and had a Foley catheter placed. Presented this time with gross hematuria, suspect urethral trauma from insertion vs pt pulling on the Foley. PSA level was normal. 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 Urology aware. Continue Flomax Follow up with urology   Schizoaffective disorder, depressive type (HCC) Schizoaffective disorder, bipolar type (HCC) Patient is on multiple antipsychotic agents which include Cogentin, Thorazine, Prozac, Zyprexa, trazodone and Ingrezza 11/29 -- persistent agitation despite Haldol.  IM Geodon ordered and psych was updated. PRN IM Haldol for agitation Thorazine PRN at beginning of agitation qhs   Aspiration pneumonia (HCC)-resolved Recent admission for PNA felt due to aspiration.  Noted to have progression of chronic dysphagia by SLP.  Became septic again on 11/30 and CXR showed worsening right-sided infiltrates.  See dysphagia Initially treated w Cefepime, and then changed to Unasyn and completed antibiotic course Aspiration precautions   Dysphagia Chronic since at least 2015, has progressed over time.  Prior admission seen by speech therapy and noted to have aspiration with both thin and thickened liquids.  Now with worsening recurrent right-sided pneumonia. SLP following MBSS deferred due to construction, can be done outpatient Tolerating pured diet with honey thickened liquids Palliative care consulted for goals of care   Severe sepsis (HCC)-resolved as of 10/28/2022 11/30 afternoon: Febrile 101.3 F, tachycardic HR 115, and soft BP's persist.  Pt still encephalopathic as well, consistent with organ dysfunction and severe sepsis d/t pneumonia and UTI. 11/30 repeat chest x-ray  showed worsening right-sided pneumonia Treated w/ abx and IV fluids    UTI (urinary tract infection) Foley had been placed for urinary retention in the ED a few days prior to this admission.  Due to AMS and cognition, unclear in urinary symptoms. Urine culture with staph aureus and enterococcus both sensitive to ampicillin. on Unasyn for Uti and aspiration PNA-completed abx course Foley replaced 12/2 after failed voiding trial  Follow w/ urology outpatient    Unintentional weight loss Per chart review, pt has had 9.5% wt loss over the past 6 months, which is not considered significant for time frame, but given malnutrition is concerning.   Needs outpatient colonoscopy if due for this (no prior records available) Monitor weights See malnutrition   Protein-calorie malnutrition, severe Related to chronic illness (COPD) as evidenced by severe fat depletion, severe muscle depletion. Appreciate dietitian recommendations Started on Ensures, MVI Monitor weight periodically Encourage PO intake given intellectual disability and cognitive decline.  GERD (gastroesophageal reflux disease) Continue PPI   Cognitive decline Treatment as outlined in 1   Falls Patient presents for evaluation of multiple falls and was seen in the ER 2 days prior to this admission.  CT's during prior ED visit revealed partially covered segmental fracture of the right zygoma.  No acute intracranial or C-spine findings. Fall precautions PT evaluation  Needs SNF/rehab, TOC following   Hypokalemia-resolved as of 10/28/2022 K 3.3 on 11/30, replaced. Monitor BMP          DVT prophylaxis: lovenox  Pertinent IV fluids/nutrition: no continuous IV fluids  Central lines /  invasive devices: Foley catheter   Code Status: FULL CODE   Disposition: inpatient  TOC needs: SNF placement  Barriers to discharge / significant pending items: unable to return to group home, requires SNF placement, BP have  improved            Discharge Instructions  Allergies as of 11/09/2022       Reactions   Propoxyphene Rash        Medication List     STOP taking these medications    budesonide 0.25 MG/2ML nebulizer solution Commonly known as: Pulmicort       TAKE these medications    albuterol 108 (90 Base) MCG/ACT inhaler Commonly known as: VENTOLIN HFA Inhale 2 puffs into the lungs every 6 (six) hours as needed for wheezing or shortness of breath.   aspirin 81 MG chewable tablet Chew 81 mg by mouth daily.   benztropine 0.5 MG tablet Commonly known as: COGENTIN Take 1 tablet (0.5 mg total) by mouth 2 (two) times daily. What changed:  medication strength how much to take when to take this   cetirizine 10 MG tablet Commonly known as: ZYRTEC Take 1 tablet (10 mg total) by mouth daily.   chlorproMAZINE 25 MG tablet Commonly known as: THORAZINE Take 1 tablet (25 mg total) by mouth daily. What changed:  medication strength how much to take   chlorproMAZINE 25 MG tablet Commonly known as: THORAZINE Take 1 tablet (25 mg total) by mouth at bedtime as needed (agitation). What changed: You were already taking a medication with the same name, and this prescription was added. Make sure you understand how and when to take each.   FLUoxetine 20 MG capsule Commonly known as: PROZAC Take 1 capsule (20 mg total) by mouth daily.   gabapentin 100 MG capsule Commonly known as: NEURONTIN Take 100 mg by mouth 3 (three) times daily.   levETIRAcetam 500 MG tablet Commonly known as: Keppra Take 1 tablet (500 mg total) by mouth 2 (two) times daily.   lovastatin 20 MG tablet Commonly known as: MEVACOR Take 1 tablet (20 mg total) by mouth daily. What changed: when to take this   midodrine 10 MG tablet Commonly known as: PROAMATINE Take 1 tablet (10 mg total) by mouth 3 (three) times daily with meals.   montelukast 10 MG tablet Commonly known as: SINGULAIR Take 1 tablet  (10 mg total) by mouth at bedtime. What changed: when to take this   multivitamin with minerals Tabs tablet Take 1 tablet by mouth daily.   OLANZapine 20 MG tablet Commonly known as: ZYPREXA Take 1 tablet (20 mg total) by mouth at bedtime.   pantoprazole 40 MG tablet Commonly known as: PROTONIX Take 1 tablet (40 mg total) by mouth daily.   tamsulosin 0.4 MG Caps capsule Commonly known as: FLOMAX Take 1 capsule (0.4 mg total) by mouth every evening.   traZODone 50 MG tablet Commonly known as: DESYREL Take 1 tablet (50 mg total) by mouth at bedtime.   Trelegy Ellipta 100-62.5-25 MCG/ACT Aepb Generic drug: Fluticasone-Umeclidin-Vilant Inhale 1 puff into the lungs daily.   valbenazine 40 MG capsule Commonly known as: INGREZZA Take 40 mg by mouth daily.         Contact information for after-discharge care     Destination     [facility] Preferred SNF .   Service: Skilled Paramedic information: [address] [place] [address] [phone]  Allergies  Allergen Reactions   Propoxyphene Rash     Subjective: pt has no complaints this morning, staff reports he didn't sleep well    Discharge Exam: BP 105/65 (BP Location: Left Leg)   Pulse 71   Temp 98.2 F (36.8 C)   Resp 14   Ht  (1.702 m)   Wt 55.6 kg   SpO2 98%   BMI 19.20 kg/m  General: Pt is awake, not in acute distress, frail  Cardiovascular: RRR, S1/S2 +, no rubs, no gallops Respiratory: CTA bilaterally, no wheezing, no rhonchi Abdominal: Soft, NT, ND, bowel sounds + Extremities: no edema, no cyanosis     The results of significant diagnostics from this hospitalization (including imaging, microbiology, ancillary and laboratory) are listed below for reference.     Microbiology: No results found for this or any previous visit (from the past 240 hour(s)).   Labs: BNP (last 3 results) No results for input(s): "BNP" in the  last 8760 hours. Basic Metabolic Panel: Recent Labs  Lab 11/03/22 0402 11/06/22 0419  NA 142 139  K 4.2 4.2  CL 107 101  CO2 29 30  GLUCOSE 107* 115*  BUN 7* 14  CREATININE 0.53* 0.65  CALCIUM 8.4* 8.9   Liver Function Tests: No results for input(s): "AST", "ALT", "ALKPHOS", "BILITOT", "PROT", "ALBUMIN" in the last 168 hours. No results for input(s): "LIPASE", "AMYLASE" in the last 168 hours. No results for input(s): "AMMONIA" in the last 168 hours. CBC: Recent Labs  Lab 11/03/22 0402 11/06/22 0419  WBC 7.4 9.9  HGB 8.8* 10.6*  HCT 28.6* 33.5*  MCV 97.6 98.0  PLT 342 474*   Cardiac Enzymes: No results for input(s): "CKTOTAL", "CKMB", "CKMBINDEX", "TROPONINI" in the last 168 hours. BNP: Invalid input(s): "POCBNP" CBG: Recent Labs  Lab 11/06/22 0913  GLUCAP 143*   D-Dimer No results for input(s): "DDIMER" in the last 72 hours. Hgb A1c No results for input(s): "HGBA1C" in the last 72 hours. Lipid Profile No results for input(s): "CHOL", "HDL", "LDLCALC", "TRIG", "CHOLHDL", "LDLDIRECT" in the last 72 hours. Thyroid function studies No results for input(s): "TSH", "T4TOTAL", "T3FREE", "THYROIDAB" in the last 72 hours.  Invalid input(s): "FREET3" Anemia work up No results for input(s): "VITAMINB12", "FOLATE", "FERRITIN", "TIBC", "IRON", "RETICCTPCT" in the last 72 hours. Urinalysis    Component Value Date/Time   COLORURINE RED (A) 10/24/2022 0006   APPEARANCEUR CLOUDY (A) 10/24/2022 0006   LABSPEC 1.021 10/24/2022 0006   PHURINE  10/24/2022 0006    TEST NOT REPORTED DUE TO COLOR INTERFERENCE OF URINE PIGMENT   GLUCOSEU (A) 10/24/2022 0006    TEST NOT REPORTED DUE TO COLOR INTERFERENCE OF URINE PIGMENT   HGBUR (A) 10/24/2022 0006    TEST NOT REPORTED DUE TO COLOR INTERFERENCE OF URINE PIGMENT   BILIRUBINUR (A) 10/24/2022 0006    TEST NOT REPORTED DUE TO COLOR INTERFERENCE OF URINE PIGMENT   KETONESUR (A) 10/24/2022 0006    TEST NOT REPORTED DUE TO COLOR  INTERFERENCE OF URINE PIGMENT   PROTEINUR (A) 10/24/2022 0006    TEST NOT REPORTED DUE TO COLOR INTERFERENCE OF URINE PIGMENT   UROBILINOGEN 0.2 10/24/2010 1942   NITRITE (A) 10/24/2022 0006    TEST NOT REPORTED DUE TO COLOR INTERFERENCE OF URINE PIGMENT   LEUKOCYTESUR (A) 10/24/2022 0006    TEST NOT REPORTED DUE TO COLOR INTERFERENCE OF URINE PIGMENT   Sepsis Labs Recent Labs  Lab 11/03/22 0402 11/06/22 0419  WBC 7.4 9.9   Microbiology No  results found for this or any previous visit (from the past 240 hour(s)). Imaging ECHOCARDIOGRAM COMPLETE  Result Date: 11/01/2022    ECHOCARDIOGRAM REPORT   [facility] Date of Exam: 11/01/2022 Medical Rec #:  [id number]     Height:       67.0 in Accession #:    [id number]    Weight:       151.2 lb Date of Birth:  03/04/1956      BSA:          1.796 m Patient Age:    66 years      BP:           90/72 mmHg Patient Gender: M             HR:           85 bpm. Exam Location:  [facility] Procedure: 2D Echo, Color Doppler and Cardiac Doppler Indications:     Dyspnea R06.00  History:         Patient has no prior history of Echocardiogram examinations.                  COPD; Risk Factors:Hypertension. Schizophrenia.  Sonographer:     Cristela BlueJerry Hege Referring Phys:  [id number] KELLY A GRIFFITH Diagnosing Phys: Debbe OdeaBrian Agbor-Etang MD  Sonographer Comments: Image acquisition challenging due to uncooperative patient and Image acquisition challenging due to patient behavioral factors. IMPRESSIONS  1. Left ventricular ejection fraction, by estimation, is 60 to 65%. The left ventricle has normal function. The left ventricle has no regional wall motion abnormalities. There is mild left ventricular hypertrophy. Left ventricular diastolic function could not be evaluated.  2. Right ventricular systolic function is normal. The right ventricular size is not well visualized.  3. The mitral valve is grossly normal. Trivial mitral valve regurgitation.  4. The aortic valve was not  well visualized. Aortic valve regurgitation is not visualized.  5. The inferior vena cava is normal in size with greater than 50% respiratory variability, suggesting right atrial pressure of 3 mmHg. FINDINGS  Left Ventricle: Left ventricular ejection fraction, by estimation, is 60 to 65%. The left ventricle has normal function. The left ventricle has no regional wall motion abnormalities. The left ventricular internal cavity size was normal in size. There is  mild left ventricular hypertrophy. Left ventricular diastolic function could not be evaluated. Right Ventricle: The right ventricular size is not well visualized. No increase in right ventricular wall thickness. Right ventricular systolic function is normal. Left Atrium: Left atrial size was normal in size. Right Atrium: Right atrial size was normal in size. Pericardium: There is no evidence of pericardial effusion. Mitral Valve: The mitral valve is grossly normal. Trivial mitral valve regurgitation. Tricuspid Valve: The tricuspid valve is grossly normal. Tricuspid valve regurgitation is not demonstrated. Aortic Valve: The aortic valve was not well visualized. Aortic valve regurgitation is not visualized. Pulmonic Valve: The pulmonic valve was not well visualized. Pulmonic valve regurgitation is not visualized. Aorta: The aortic root is normal in size and structure. Venous: The inferior vena cava is normal in size with greater than 50% respiratory variability, suggesting right atrial pressure of 3 mmHg. IAS/Shunts: No atrial level shunt detected by color flow Doppler.  LEFT VENTRICLE PLAX 2D LVIDd:         4.30 cm LVIDs:         2.70 cm LV PW:         1.00 cm LV IVS:  1.30 cm LVOT diam:     2.00 cm LVOT Area:     3.14 cm  LEFT ATRIUM         Index LA diam:    3.60 cm 2.00 cm/m   AORTA Ao Root diam: 3.60 cm  SHUNTS Systemic Diam: 2.00 cm Debbe Odea MD Electronically signed by Debbe Odea MD Signature Date/Time: 11/01/2022/7:38:37 PM    Final        Time coordinating discharge: over 30 minutes  SIGNED:  Sunnie Nielsen DO [facility]

## 2022-11-09 NOTE — Progress Notes (Signed)
Pt discharging via EMS to [facility]. Attempted x2 to call report to [facility] SNF, no answer, unable to leave voicemail. Will try again.

## 2022-11-09 NOTE — Plan of Care (Signed)
  Problem: Education: Goal: Knowledge of General Education information will improve Description: Including pain rating scale, medication(s)/side effects and non-pharmacologic comfort measures 11/09/2022 0927 by Morene Rankins, RN Outcome: Adequate for Discharge 11/09/2022 0732 by Morene Rankins, RN Outcome: Progressing   Problem: Health Behavior/Discharge Planning: Goal: Ability to manage health-related needs will improve 11/09/2022 0927 by Morene Rankins, RN Outcome: Adequate for Discharge 11/09/2022 0732 by Morene Rankins, RN Outcome: Progressing   Problem: Clinical Measurements: Goal: Ability to maintain clinical measurements within normal limits will improve 11/09/2022 0927 by Morene Rankins, RN Outcome: Adequate for Discharge 11/09/2022 0732 by Morene Rankins, RN Outcome: Progressing Goal: Will remain free from infection 11/09/2022 0927 by Morene Rankins, RN Outcome: Adequate for Discharge 11/09/2022 0732 by Morene Rankins, RN Outcome: Progressing Goal: Diagnostic test results will improve 11/09/2022 0927 by Morene Rankins, RN Outcome: Adequate for Discharge 11/09/2022 0732 by Morene Rankins, RN Outcome: Progressing Goal: Respiratory complications will improve 11/09/2022 0927 by Morene Rankins, RN Outcome: Adequate for Discharge 11/09/2022 0732 by Morene Rankins, RN Outcome: Progressing Goal: Cardiovascular complication will be avoided 11/09/2022 0927 by Morene Rankins, RN Outcome: Adequate for Discharge 11/09/2022 0732 by Morene Rankins, RN Outcome: Progressing   Problem: Activity: Goal: Risk for activity intolerance will decrease 11/09/2022 0927 by Morene Rankins, RN Outcome: Adequate for Discharge 11/09/2022 0732 by Morene Rankins, RN Outcome: Progressing   Problem: Nutrition: Goal: Adequate nutrition will be maintained 11/09/2022 0927 by Morene Rankins, RN Outcome: Adequate for Discharge 11/09/2022 0732 by Morene Rankins, RN Outcome: Progressing   Problem: Coping: Goal: Level of anxiety will decrease 11/09/2022 0927 by Morene Rankins, RN Outcome: Adequate for Discharge 11/09/2022 0732 by Morene Rankins, RN Outcome: Progressing   Problem: Elimination: Goal: Will not experience complications related to bowel motility 11/09/2022 0927 by Morene Rankins, RN Outcome: Adequate for Discharge 11/09/2022 0732 by Morene Rankins, RN Outcome: Progressing Goal: Will not experience complications related to urinary retention 11/09/2022 0927 by Morene Rankins, RN Outcome: Adequate for Discharge 11/09/2022 0732 by Morene Rankins, RN Outcome: Progressing   Problem: Pain Managment: Goal: General experience of comfort will improve 11/09/2022 0927 by Morene Rankins, RN Outcome: Adequate for Discharge 11/09/2022 0732 by Morene Rankins, RN Outcome: Progressing   Problem: Safety: Goal: Ability to remain free from injury will improve 11/09/2022 0927 by Morene Rankins, RN Outcome: Adequate for Discharge 11/09/2022 0732 by Morene Rankins, RN Outcome: Progressing   Problem: Skin Integrity: Goal: Risk for impaired skin integrity will decrease 11/09/2022 0927 by Morene Rankins, RN Outcome: Adequate for Discharge 11/09/2022 0732 by Morene Rankins, RN Outcome: Progressing

## 2022-11-13 ENCOUNTER — Encounter: Payer: Self-pay | Admitting: Nurse Practitioner

## 2022-11-13 ENCOUNTER — Non-Acute Institutional Stay: Payer: Self-pay | Admitting: Nurse Practitioner

## 2022-11-13 DIAGNOSIS — J449 Chronic obstructive pulmonary disease, unspecified: Secondary | ICD-10-CM

## 2022-11-13 DIAGNOSIS — R630 Anorexia: Secondary | ICD-10-CM

## 2022-11-13 DIAGNOSIS — Z515 Encounter for palliative care: Secondary | ICD-10-CM

## 2022-11-13 DIAGNOSIS — R5381 Other malaise: Secondary | ICD-10-CM

## 2022-11-13 DIAGNOSIS — R0602 Shortness of breath: Secondary | ICD-10-CM

## 2022-11-13 NOTE — Progress Notes (Addendum)
Therapist, nutritional Palliative Care Consult Note Telephone: [phone]  Fax: [phone]   Date of encounter: 11/13/22 10:14 PM PATIENT NAME: Ian Medina [address] [place] [address]   [phone] (home)  DOB: July 06, 1956 MRN: [id number] PRIMARY CARE PROVIDER:    Lorelee Market, MD,  [address] [place] [address] [phone]  REFERRING PROVIDER:  [facility] Lorelee Market, MD [address] [place],  [place] [address] [phone]  RESPONSIBLE PARTY:    Contact Information     Name Relation Home Work Mobile   nines,carrie Legal Guardian   [phone]      I met face to face with patient in facility. Palliative Care was asked to follow this patient by consultation request of  Lorelee Market, MD/AHCC to address advance care planning and complex medical decision making. This is the initial visit.                           ASSESSMENT AND PLAN / RECOMMENDATIONS:  Symptom Management/Plan: 1. Advance Care Planning;  Full code 2. Goals of Care: Goals include to maximize quality of life and symptom management. Our advance care planning conversation included a discussion about:    The value and importance of advance care planning  Exploration of personal, cultural or spiritual beliefs that might influence medical decisions  Exploration of goals of care in the event of a sudden injury or illness  Identification and preparation of a healthcare agent  Review and updating or creation of an advance directive document. 3. Palliative care encounter; Palliative care encounter; Palliative medicine team will continue to support patient, patient's family, and medical team. Visit consisted of counseling and education dealing with the complex and emotionally intense issues of symptom management and palliative care in the setting of serious and potentially life-threatening illness 4 debility secondary to decompensation 5.  Anorexia secondary to copd continue to monitor weights, appetite %, supplements, encourage Mr Galan to eat, reviewed weights.  6. Sob secondary to copd, remains on O2, continue to monitor O2 saturations, inhalation therapy, respiratory status.   Follow up Palliative Care Visit: Palliative care will continue to follow for complex medical decision making, advance care planning, and clarification of goals. Return 2 to 4  weeks or prn.  I spent 46 minutes providing this consultation. More than 50% of the time in this consultation was spent in counseling and care coordination. PPS: 40% Chief Complaint: Initial palliative consult for complex medical decision making, address goals, manage ongoing symptoms  HISTORY OF PRESENT ILLNESS:  Ian Medina is a 66 y.o. year old male  with multiple medical problems including schizoaffective disorder, mild intellectual disability, nicotine dependence, COPD, generalized anxiety disorder , hypertension, hepatitis C, group home resident with guardians through DSS. Mr Elg resides at [facility] currently for STR. Mr Rampone was hospitalized from 10/24/2022 to 11/09/2022 from a group home for acute metabolic encephalopathy with urinary retention in the setting of BPH, hypotension, falls, dysphagia, severe protein calorie malnutrition. CT's of head, neck and face showed only a R zygomatic fracture. He was d/c to STR at [facility]. Mr Butorac requires assistance with transfers, mobility, adl's, bathing, dressing. Mr Roeth does feed himself with poor appetite, ongoing decline. At present Mr Latendresse is lying in bed, appears debilitated, comfortable, weak, overall chronically ill. Mr Jacox did make eye contact. We talked about ros, limited with cognitive impairment. Mr Jone was cooperative with assessment. Medical goals, medications, and poc reviewed.  Attempted to reach [place] 11/15/2022 spoke with Carolin Coy, reviewed goc, PMH, last time Mr Colin was independent. [name]  endorses medication changes and Mr Goldsberry has had a rapid decline. Discussed medical goals. We talked about will further discuss code status, slow progress with therapy. Discussed high risk of re-hospitalization due to decompensation, debility, co-morbities. We talked about poc. Support provided, questions answered.   History obtained from review of EMR, discussion with primary team, and interview with family, facility staff/caregiver and/or Mr. Beaird.  I reviewed available labs, medications, imaging, studies and related documents from the EMR.  Records reviewed and summarized above.   ROS 10 point system reviewed all negative except HPi  Physical Exam: Constitutional: NAD General: frail appearing, thin pleasant male EYES: lids intact ENMT: oral mucous membranes moist CV: S1S2, RRR Pulmonary: decreased bases Abdomen: soft and non tender MSK: w/c Skin: warm and dry Neuro:  +generalized weakness Psych: non-anxious affect, A and Oriented to self,  CURRENT PROBLEM LIST:  Patient Active Problem List   Diagnosis Date Noted   Hypotension 10/31/2022   UTI (urinary tract infection) 10/29/2022   Aspiration pneumonia (HCC) 10/28/2022   Schizoaffective disorder, bipolar type (HCC) 10/27/2022   Protein-calorie malnutrition, severe 10/26/2022   Unintentional weight loss 10/26/2022   Acute metabolic encephalopathy 10/24/2022   GERD (gastroesophageal reflux disease) 10/24/2022   Urinary retention due to benign prostatic hyperplasia 10/24/2022   Sepsis due to pneumonia (HCC) 09/24/2022   Falls    Multiple rib fractures    Cognitive decline 06/08/2014   Dysphagia 05/11/2014   PAST MEDICAL HISTORY:  Active Ambulatory Problems    Diagnosis Date Noted   Dysphagia 05/11/2014   Sepsis due to pneumonia (HCC) 09/24/2022   Falls    Multiple rib fractures    Cognitive decline 06/08/2014   Acute metabolic encephalopathy 10/24/2022   GERD (gastroesophageal reflux disease) 10/24/2022   Urinary  retention due to benign prostatic hyperplasia 10/24/2022   Protein-calorie malnutrition, severe 10/26/2022   Unintentional weight loss 10/26/2022   Schizoaffective disorder, bipolar type (HCC) 10/27/2022   Aspiration pneumonia (HCC) 10/28/2022   UTI (urinary tract infection) 10/29/2022   Hypotension 10/31/2022   Resolved Ambulatory Problems    Diagnosis Date Noted   Hepatitis C antibody test positive 03/12/2013   Encounter for screening colonoscopy 03/12/2013   Drug abuse (HCC) 08/23/2016   Aggressive behavior 06/03/2022   Sepsis (HCC) 09/24/2022   Schizoaffective disorder, depressive type (HCC)    Severe sepsis (HCC) 09/25/2022   Hypokalemia 10/28/2022   Past Medical History:  Diagnosis Date   Anxiety    Atherosclerosis of aorta (HCC)    Auditory hallucination    Cerebral aneurysm, nonruptured    COPD with emphysema (HCC)    Dysphagia, oropharyngeal phase    GAD (generalized anxiety disorder)    Hepatitis C    Hypercholesterolemia    Hypertension    Middle cerebral artery aneurysm    Mild intellectual disabilities    Paranoia (HCC)    Pneumonia 2017   Psoriasis    Psoriasis vulgaris    Repeated falls    Schizophrenia (HCC)    SOCIAL HX:  Social History   Tobacco Use   Smoking status: Former    Packs/day: 1.00    Types: Cigarettes   Smokeless tobacco: Never  Substance Use Topics   Alcohol use: No   FAMILY HX:  Family History  Problem Relation Age of Onset   COPD Father    Colon cancer Neg Hx  ALLERGIES:  Allergies  Allergen Reactions   Propoxyphene Rash     PERTINENT MEDICATIONS:  Outpatient Encounter Medications as of 11/13/2022  Medication Sig   albuterol (VENTOLIN HFA) 108 (90 Base) MCG/ACT inhaler Inhale 2 puffs into the lungs every 6 (six) hours as needed for wheezing or shortness of breath.   aspirin 81 MG chewable tablet Chew 81 mg by mouth daily.   benztropine (COGENTIN) 0.5 MG tablet Take 1 tablet (0.5 mg total) by mouth 2 (two) times  daily.   cetirizine (ZYRTEC) 10 MG tablet Take 1 tablet (10 mg total) by mouth daily.   chlorproMAZINE (THORAZINE) 25 MG tablet Take 1 tablet (25 mg total) by mouth daily.   chlorproMAZINE (THORAZINE) 25 MG tablet Take 1 tablet (25 mg total) by mouth at bedtime as needed (agitation).   FLUoxetine (PROZAC) 20 MG capsule Take 1 capsule (20 mg total) by mouth daily.   Fluticasone-Umeclidin-Vilant (TRELEGY ELLIPTA) 100-62.5-25 MCG/ACT AEPB Inhale 1 puff into the lungs daily.   gabapentin (NEURONTIN) 100 MG capsule Take 100 mg by mouth 3 (three) times daily.   levETIRAcetam (KEPPRA) 500 MG tablet Take 1 tablet (500 mg total) by mouth 2 (two) times daily.   lovastatin (MEVACOR) 20 MG tablet Take 1 tablet (20 mg total) by mouth daily. (Patient taking differently: Take 20 mg by mouth every evening.)   midodrine (PROAMATINE) 10 MG tablet Take 1 tablet (10 mg total) by mouth 3 (three) times daily with meals.   montelukast (SINGULAIR) 10 MG tablet Take 1 tablet (10 mg total) by mouth at bedtime. (Patient taking differently: Take 10 mg by mouth every evening.)   Multiple Vitamin (MULTIVITAMIN WITH MINERALS) TABS tablet Take 1 tablet by mouth daily.   OLANZapine (ZYPREXA) 20 MG tablet Take 1 tablet (20 mg total) by mouth at bedtime.   pantoprazole (PROTONIX) 40 MG tablet Take 1 tablet (40 mg total) by mouth daily.   tamsulosin (FLOMAX) 0.4 MG CAPS capsule Take 1 capsule (0.4 mg total) by mouth every evening.   traZODone (DESYREL) 50 MG tablet Take 1 tablet (50 mg total) by mouth at bedtime.   valbenazine (INGREZZA) 40 MG capsule Take 40 mg by mouth daily.   No facility-administered encounter medications on file as of 11/13/2022.   Thank you for the opportunity to participate in the care of Mr. Badilla.  The palliative care team will continue to follow. Please call our office at [phone] if we can be of additional assistance.   Maral Lampe Z Deaun Rocha, NP ,   COVID-19 PATIENT SCREENING TOOL Asked and  negative response unless otherwise noted:  Have you had symptoms of covid, tested positive or been in contact with someone with symptoms/positive test in the past 5-10 days?

## 2022-12-01 ENCOUNTER — Ambulatory Visit: Payer: 59 | Admitting: Pulmonary Disease

## 2022-12-04 ENCOUNTER — Encounter: Payer: Self-pay | Admitting: Nurse Practitioner

## 2022-12-04 ENCOUNTER — Non-Acute Institutional Stay: Payer: 59 | Admitting: Nurse Practitioner

## 2022-12-04 ENCOUNTER — Ambulatory Visit: Payer: 59 | Admitting: Pulmonary Disease

## 2022-12-04 VITALS — BP 105/55 | HR 87 | Temp 97.4°F | Resp 18 | Wt 119.7 lb

## 2022-12-04 DIAGNOSIS — R0602 Shortness of breath: Secondary | ICD-10-CM

## 2022-12-04 DIAGNOSIS — R630 Anorexia: Secondary | ICD-10-CM

## 2022-12-04 DIAGNOSIS — J449 Chronic obstructive pulmonary disease, unspecified: Secondary | ICD-10-CM

## 2022-12-04 DIAGNOSIS — R5381 Other malaise: Secondary | ICD-10-CM

## 2022-12-04 DIAGNOSIS — Z515 Encounter for palliative care: Secondary | ICD-10-CM

## 2022-12-04 NOTE — Progress Notes (Signed)
Therapist, nutritional Palliative Care Consult Note Telephone: [phone]  Fax: [phone]    Date of encounter: 12/04/22 1:44 PM PATIENT NAME: Ian Medina [address] [place] [address]   [phone] (home)  DOB: July 09, 1956 MRN: [id number] PRIMARY CARE PROVIDER:    [facility] RESPONSIBLE PARTY:    Contact Information     Name Relation Home Work Mobile   nines,carrie Legal [phone]     I met face to face with patient in facility. Palliative Care was asked to follow this patient by consultation request of  Ian Market, MD/AHCC to address advance care planning and complex medical decision making. This is the initial visit.                           ASSESSMENT AND PLAN / RECOMMENDATIONS:  Symptom Management/Plan: 1. Advance Care Planning;  Full code  Discussed with Ian Medina DSS Guardian about code status; with overall decline in the setting of chronic disease of schizoaffective disorder, mild intellectual disability, nicotine dependence, COPD, generalized anxiety disorder , hypertension, hepatitis C following recent 10/24/2022 to 11/09/2022 from a group home for acute metabolic encephalopathy with urinary retention in the setting of BPH, hypotension, falls, dysphagia, severe protein calorie malnutrition. Ian Ian Medina has continued to decompensate, more difficulty participating in activities, following commands, previously was ambulatory, currently requires assistance for tranfers; total care bathing, dressing. Cognitively was able to make needs know, since hospitalization more difficulty understanding words, more difficulty processing and verbalizing needs. Ian Medina has had a dramatic weight loss of over 80 lbs/40.15% in last 9 months with BMI 18.2 with muscle wasting with atrophy Should CPR be attempted with fragile state of health, unlikely survival causing severe complications to possible include broken  bones/sternum/vertebrae, punctured lungs, artificial means to keeping him alive which could endure severe suffering.   Recommendation do not resuscitate, minimize suffering, improve quality of life with comfort care measures including hospice should not progress with therapy.   2. Palliative care encounter; Palliative care encounter; Palliative medicine team will continue to support patient, patient's family, and medical team. Visit consisted of counseling and education dealing with the complex and emotionally intense issues of symptom management and palliative care in the setting of serious and potentially life-threatening illness 3 Debility secondary to decompensation, recent hospitalization, continues ongoing decline functionally. Continue to encourage therapy PT/OT as able, monitor progress. 4. Anorexia secondary to copd continue to monitor weights, appetite %, supplements, encourage Ian Medina to eat, reviewed weights.  03/2022 weight 200 lbs 11/10/2023 weight 123 lbs 11/28/2022 weight 119.7 lbs 80.3 lbs/9 months; 40.15% 3.3 lbs/3 weeks; 2.68% 18.2 BMI 5. Sob secondary to copd, remains on O2, continue to monitor O2 saturations, inhalation therapy, respiratory status.    Follow up Palliative Care Visit: Palliative care will continue to follow for complex medical decision making, advance care planning, and clarification of goals. Return 2 to 4  weeks or prn. Continue to monitor, follow with PC with therapy, monitoring weights, appetite, overall decline, debility.    I spent 45 minutes providing this consultation starting at 1:15 pm. More than 50% of the time in this consultation was spent in counseling and care coordination. PPS: 40% Chief Complaint: Initial palliative consult for complex medical decision making, address goals, manage ongoing symptoms   HISTORY OF PRESENT ILLNESS:  Ian Medina is a 67 y.o. year old male  with multiple medical problems including schizoaffective disorder, mild  intellectual disability, nicotine dependence, COPD, generalized anxiety disorder , hypertension, hepatitis C, group home resident with guardians through [place]. Ian Medina resides at [facility] currently for STR. Ian Medina was hospitalized from 10/24/2022 to 11/09/2022 from a group home for acute metabolic encephalopathy with urinary retention in the setting of BPH, hypotension, falls, dysphagia, severe protein calorie malnutrition. CT's of head, neck and face showed only a R zygomatic fracture. He was d/c to STR at [facility]. Ian Medina requires assistance with transfers, mobility, adl's, bathing, dressing. Ian Medina does feed himself with poor appetite, ongoing decline. Ongoing weight loss of >80 lbs in 9 months; 40.15% 3.3 lbs/3 weeks; 2.68%. Ian Medina continues to attempt to participate in therapy. Ian Medina at present is lying in bed, makes eye contact, mumbles few clear words very difficult to understand. Ian Medina no meaningful discussion with cognitive impairment. Support provided. Ian Medina was cooperative with assessment. I called [place], clinical update given. We talked about code status, medical goals, functional and cognitive decline with weight loss, therapy, option of hospice services once therapy is completed with transition to LTC. Medical goals, medications, poc reviewed. Recommendations as above. Updated staff. Continue to monitor, follow with PC with therapy, monitoring weights, appetite, overall decline, debility.   History obtained from review of EMR, discussion with primary team, and interview with family, facility staff/caregiver and/or Ian Medina.  I reviewed available labs, medications, imaging, studies and related documents from the EMR.  Records reviewed and summarized above.  Physical Exam: Constitutional: NAD General: frail appearing, thin pleasant male, difficult to understand language, mumbles ENMT: oral mucous membranes moist CV: S1S2, RRR Pulmonary: decreased  bases Abdomen: soft and non tender MSK: w/c Skin: warm and dry Neuro:  +generalized weakness Psych: non-anxious affect, A and Oriented to self Thank you for the opportunity to participate in the care of Ian. Stennis. Please call our office at [phone] if we can be of additional assistance.   Zarinah Oviatt Ihor Gully, NP

## 2022-12-11 ENCOUNTER — Emergency Department: Payer: 59

## 2022-12-11 ENCOUNTER — Emergency Department
Admission: EM | Admit: 2022-12-11 | Discharge: 2022-12-12 | Disposition: A | Discharge status: Skilled Nursing Facility | Payer: 59 | Attending: Student in an Organized Health Care Education/Training Program | Admitting: Student in an Organized Health Care Education/Training Program

## 2022-12-11 ENCOUNTER — Ambulatory Visit (INDEPENDENT_AMBULATORY_CARE_PROVIDER_SITE_OTHER): Payer: Medicaid Other | Admitting: Gastroenterology

## 2022-12-11 ENCOUNTER — Other Ambulatory Visit: Payer: Self-pay

## 2022-12-11 ENCOUNTER — Encounter: Payer: Self-pay | Admitting: Emergency Medicine

## 2022-12-11 DIAGNOSIS — S63280A Dislocation of proximal interphalangeal joint of right index finger, initial encounter: Secondary | ICD-10-CM | POA: Insufficient documentation

## 2022-12-11 DIAGNOSIS — W1830XA Fall on same level, unspecified, initial encounter: Secondary | ICD-10-CM | POA: Diagnosis not present

## 2022-12-11 DIAGNOSIS — S72115A Nondisplaced fracture of greater trochanter of left femur, initial encounter for closed fracture: Secondary | ICD-10-CM | POA: Diagnosis not present

## 2022-12-11 DIAGNOSIS — I10 Essential (primary) hypertension: Secondary | ICD-10-CM | POA: Diagnosis not present

## 2022-12-11 DIAGNOSIS — Y92009 Unspecified place in unspecified non-institutional (private) residence as the place of occurrence of the external cause: Secondary | ICD-10-CM | POA: Diagnosis not present

## 2022-12-11 DIAGNOSIS — J449 Chronic obstructive pulmonary disease, unspecified: Secondary | ICD-10-CM | POA: Diagnosis not present

## 2022-12-11 DIAGNOSIS — S79912A Unspecified injury of left hip, initial encounter: Secondary | ICD-10-CM | POA: Diagnosis present

## 2022-12-11 MED ORDER — TRAMADOL HCL 50 MG PO TABS
50.0000 mg | ORAL_TABLET | Freq: Once | ORAL | Status: DC
  Filled 2022-12-11: qty 1

## 2022-12-11 MED ORDER — SODIUM CHLORIDE 0.9 % IV BOLUS
1000.0000 mL | Freq: Once | INTRAVENOUS | Status: AC
  Administered 2022-12-11: 1000 mL via INTRAVENOUS

## 2022-12-11 NOTE — ED Notes (Signed)
First nurse-pt brought in via ems from [facility]. Pt fell yesterday and has left hip pain.  Pt ambulatory per ems.  Pt alert x 1.  Bp 102/ 67 per ems.

## 2022-12-11 NOTE — ED Notes (Signed)
Pressure bag applied to IV fluids.

## 2022-12-11 NOTE — Discharge Instructions (Addendum)
Mr. Ledford has a fracture to the hip pointer on the left hip.  This will initially be managed in a nonsurgical manner.  He must use a walker to ambulate and transfer with only toe-touch weightbearing.  This means only the toe and not all of his weight should be on the left leg.  He should have standby assistance for any transfers or attempts to ambulate.  He should follow-up with Dr. Karel Jarvis in orthopedics in a week.

## 2022-12-11 NOTE — ED Notes (Signed)
Provider aware of bp, provider states she will be giving fluids and we will start iv

## 2022-12-11 NOTE — ED Provider Notes (Addendum)
[facility] Emergency Department Provider Note     Event Date/Time   First MD Initiated Contact with Patient 12/11/22 1842     (approximate)   History   Fall   HPI  Ian Medina is a 67 y.o. male with past medical history consistent with chronic disease of schizoaffective disorder, mild intellectual disability, nicotine dependence, COPD, generalized anxiety disorder , hypertension, hepatitis C, recently transferred from a group home for hospital admission from 10/24/2022 to 11/09/2022 for acute metabolic encephalopathy with urinary retention in the setting of BPH, hypotension, falls, dysphagia, and severe protein calorie malnutrition.  He presents to the ED via EMS from his facility.  According to EMS report, the patient had mechanical fall yesterday, but has an ambulatory is late yesterday.  No other injuries reported at this time.  Patient is alert and talkative, but only intermittently audible.   Physical Exam   Triage Vital Signs: ED Triage Vitals  Enc Vitals Group     BP 12/11/22 1803 (!) 133/101     Pulse Rate 12/11/22 1803 100     Resp 12/11/22 1803 18     Temp 12/11/22 1803 97.8 F (36.6 C)     Temp Source 12/11/22 1803 Axillary     SpO2 12/11/22 1803 100 %     Weight 12/11/22 1833 119 lb 7.8 oz (54.2 kg)     Height 12/11/22 1833 5\' 7"  (1.702 m)     Head Circumference --      Peak Flow --      Pain Score --      Pain Loc --      Pain Edu? --      Excl. in GC? --     Most recent vital signs: Vitals:   12/11/22 2245 12/11/22 2311  BP: 95/67 115/79  Pulse:  81  Resp:  16  Temp:  98.7 F (37.1 C)  SpO2:  95%    General Awake, no distress.  CV:  Good peripheral perfusion.  RESP:  Normal effort.  ABD:  No distention.  MSK:  Left  hip with large hematoma and STS at the trochanter   ED Results / Procedures / Treatments   Labs (all labs ordered are listed, but only abnormal results are displayed) Labs Reviewed - No data to  display   EKG    RADIOLOGY  {**I personally viewed and evaluated these images as part of my medical decision making, as well as reviewing the written report by the radiologist.  ED Provider Interpretation: acute left greater troch avulsion noted  CT HIP LEFT WO CONTRAST  Result Date: 12/11/2022 CLINICAL DATA:  Hip trauma, fracture suspected, xray done hip fx EXAM: CT OF THE LEFT HIP WITHOUT CONTRAST TECHNIQUE: Multidetector CT imaging of the left hip was performed according to the standard protocol. Multiplanar CT image reconstructions were also generated. RADIATION DOSE REDUCTION: This exam was performed according to the departmental dose-optimization program which includes automated exposure control, adjustment of the mA and/or kV according to patient size and/or use of iterative reconstruction technique. COMPARISON:  Plain films today FINDINGS: Bones/Joint/Cartilage There is a fracture through the left femoral greater trochanter with mild displacement. No femoral neck involvement. No subluxation or dislocation. Ligaments Suboptimally assessed by CT. Muscles and Tendons 5.4 cm high-density area along the surface of the inferior gluteal muscles posteriorly, likely hematoma. Soft tissues Extensive subcutaneous edema/stranding. IMPRESSION: Fracture involving the left femoral greater trochanter. No femoral neck fracture. Hematoma along the posteroinferior surface  of the left gluteal muscles measures up to 5.4 cm. Extensive subcutaneous edema/stranding. Electronically Signed   By: Rolm Baptise M.D.   On: 12/11/2022 19:36   DG HIP UNILAT WITH PELVIS 2-3 VIEWS LEFT  Result Date: 12/11/2022 CLINICAL DATA:  Fall, pain EXAM: DG HIP (WITH OR WITHOUT PELVIS) 2-3V LEFT COMPARISON:  None Available. FINDINGS: Displaced fracture of greater trochanter of the left femur. No other fracture or dislocation of the left femur. No fracture or dislocation of the pelvis or proximal right femur seen in single frontal  view. Large stool ball in the rectum and a large burden of stool in the included portions of the colon. Vascular calcinosis. IMPRESSION: 1. Displaced fracture of greater trochanter of the left femur. No other fracture or dislocation of the left femur. Consider CT to further evaluate fracture anatomy. 2. Large stool ball in the rectum and a large burden of stool in the included portions of the colon. Electronically Signed   By: Delanna Ahmadi M.D.   On: 12/11/2022 18:47     PROCEDURES:  Critical Care performed: No  Procedures   MEDICATIONS ORDERED IN ED: Medications  traMADol (ULTRAM) tablet 50 mg (50 mg Oral Not Given 12/11/22 2111)  sodium chloride 0.9 % bolus 1,000 mL (0 mLs Intravenous Stopped 12/11/22 2311)     IMPRESSION / MDM / ASSESSMENT AND PLAN / ED COURSE  I reviewed the triage vital signs and the nursing notes.                              Differential diagnosis includes, but is not limited to, hip fracture, hip dislocation, pelvic fracture, hematoma, contusion, muscle strain  Patient's presentation is most consistent with acute complicated illness / injury requiring diagnostic workup.  ----------------------------------------- 7:56 PM on 12/11/2022 ----------------------------------------- S/w Dr. Karel Jarvis: Since according to the note, patient was ambulatory today following his fall, [facility] feels confident that patient can be managed in a nonsurgical manner at this time.  He should be kept toe-touch weightbearing with standby assist for transfers.  If the patient can avoid a fall, he can likely be managed without operative involvement.  If the patient were to sustain another fall, injuring the left hip, this would likely cause extension of the fracture and surgical intervention would likely be required.  A[facility]man asked that we stress these discharge instruction to the patient's care staff as well as his legal guardian.  He can otherwise follow-up with [facility]man in the office later  in the week.  Patient's diagnosis is consistent with acute greater trochanter avulsion fracture. No evidence of extension into prox femur on XR./CT. Patient will be discharged home with instructions for TTWB with standby assist on transfers.  Patient's legal guardian and facility care team will be notified of his activity restrictions.  Patient is to follow up with [facility] (Ortho) in 1 week. Patient is given ED precautions to return to the ED for any worsening or new symptoms.  ----------------------------------------- 11:15 PM on 12/11/2022 ----------------------------------------- Patient is alert and engaged. He had low BPs noted prior to discharge. Patient now with SBPs in the 110s. He is aware of his diagnosis and stable for DC at this time.    FINAL CLINICAL IMPRESSION(S) / ED DIAGNOSES   Final diagnoses:  Fall in home, initial encounter  Closed nondisplaced fracture of greater trochanter of left femur, initial encounter ([facility])     Rx / DC Orders   ED Discharge Orders  None        Note:  This document was prepared using Dragon voice recognition software and may include unintentional dictation errors.    Melvenia Needles, [name] 12/11/22 2053    Melvenia Needles, [name] 12/11/22 2317    Merlyn Lot, MD 12/12/22 [phone]

## 2022-12-11 NOTE — ED Triage Notes (Signed)
Pt presents to the ED via [facility] from [facility]. Pt had a fall yesterday and c/o L hip pain. Pt A&Ox1.

## 2022-12-12 ENCOUNTER — Other Ambulatory Visit: Payer: Self-pay

## 2022-12-12 ENCOUNTER — Emergency Department: Payer: 59

## 2022-12-12 ENCOUNTER — Emergency Department
Admission: EM | Admit: 2022-12-12 | Discharge: 2022-12-12 | Disposition: A | Discharge status: Home / Self Care | Payer: 59 | Source: Home / Self Care | Attending: Emergency Medicine | Admitting: Emergency Medicine

## 2022-12-12 DIAGNOSIS — S63259A Unspecified dislocation of unspecified finger, initial encounter: Secondary | ICD-10-CM

## 2022-12-12 DIAGNOSIS — S72115A Nondisplaced fracture of greater trochanter of left femur, initial encounter for closed fracture: Secondary | ICD-10-CM | POA: Diagnosis not present

## 2022-12-12 DIAGNOSIS — S63280A Dislocation of proximal interphalangeal joint of right index finger, initial encounter: Secondary | ICD-10-CM | POA: Insufficient documentation

## 2022-12-12 DIAGNOSIS — W1830XA Fall on same level, unspecified, initial encounter: Secondary | ICD-10-CM | POA: Insufficient documentation

## 2022-12-12 MED ORDER — LIDOCAINE HCL (PF) 1 % IJ SOLN
30.0000 mL | Freq: Once | INTRAMUSCULAR | Status: AC
  Administered 2022-12-12: 10 mL via INTRADERMAL
  Filled 2022-12-12: qty 30

## 2022-12-12 NOTE — ED Notes (Signed)
Attempted to call report to [facility] at [phone], no answer and no voicemail available

## 2022-12-12 NOTE — ED Notes (Signed)
Pt went with [facility] resting on stretcher, equal chest rise and fall noted in NAD.

## 2022-12-12 NOTE — ED Notes (Signed)
Gave report to [facility] who stated she is pts nurse.

## 2022-12-12 NOTE — ED Triage Notes (Signed)
Pt arrives by [facility] from [facility] for fall this morning.  Xray showed broken pointer finger on right hand.  Pt did not hit his head. Alert at baseline and on room air. Denies any pain at this time.

## 2022-12-12 NOTE — ED Notes (Signed)
This Rn at bedside with MD.  Right index finger was injected with lidocaine and relocated into place by MD.  Pt tolerated procedure and in NAD.

## 2022-12-12 NOTE — ED Provider Notes (Signed)
[facility] Provider Note   Event Date/Time   First MD Initiated Contact with Patient 12/12/22 1830     (approximate) History  Fall  HPI Bodie Abernethy is a 67 y.o. male with unknown past medical history who presents from his long-term care facility via EMS after a mechanical fall from standing resulting in deformity to the right index finger.  Of note, patient had an x-ray done at his facility that showed a right finger dislocation/fracture and was sent to our emergency department for further evaluation.  Patient denies any complaints at this time   Physical Exam  Triage Vital Signs: ED Triage Vitals  Enc Vitals Group     BP 12/12/22 1838 (!) 137/107     Pulse Rate 12/12/22 1838 71     Resp --      Temp --      Temp src --      SpO2 12/12/22 1838 95 %     Weight --      Height --      Head Circumference --      Peak Flow --      Pain Score 12/12/22 1839 0     Pain Loc --      Pain Edu? --      Excl. in [place]? --    Most recent vital signs: Vitals:   12/12/22 1838 12/12/22 1900  BP: (!) 137/107 91/68  Pulse: 71 62  Resp:  19  SpO2: 95% 98%   General: Awake, oriented x4. CV:  Good peripheral perfusion.  Resp:  Normal effort.  Abd:  No distention.  Other:  Elderly Caucasian male laying in bed asleep but easily arousable.  There is a deformity to the second PIP joint of the right hand with good cap refill distally ED Results / Procedures / Treatments  RADIOLOGY ED MD interpretation: X-ray of the right hand shows dorsal dislocation of the second proximal interphalangeal joint with no acute fracture -Agree with radiology assessment Official radiology report(s): DG Hand Complete Right  Result Date: 12/12/2022 CLINICAL DATA:  [place], deformity EXAM: RIGHT HAND - COMPLETE 3+ VIEW COMPARISON:  None Available. FINDINGS: Frontal, oblique, lateral views of the right hand are obtained. There is dorsal dislocation of the second proximal interphalangeal joint.  No evidence of fracture. Remaining bony structures are unremarkable. Joint spaces are relatively well preserved. Soft tissues are normal. IMPRESSION: 1. Dorsal dislocation of the second proximal interphalangeal joint. No acute fracture. Electronically Signed   By: Randa Ngo M.D.   On: 12/12/2022 19:22   PROCEDURES: Critical Care performed: No Procedures MEDICATIONS ORDERED IN ED: Medications  lidocaine (PF) (XYLOCAINE) 1 % injection 30 mL (10 mLs Intradermal Given 12/12/22 2014)   IMPRESSION / MDM / ASSESSMENT AND PLAN / ED COURSE  I reviewed the triage vital signs and the nursing notes.                             The patient is on the cardiac monitor to evaluate for evidence of arrhythmia and/or significant heart rate changes. Patient's presentation is most consistent with acute presentation with potential threat to life or bodily function.  Workup: XR right hand Findings: Dislocation  Patient does not currently demonstrate complications of dislocation such as compartment syndrome, arterial injury or nerve injury. The dislocation has been satisfactorily reduced and immobilized, and the patient has been given appropriate analgesia. Rx: sling immobilization 1 week, with  gentle ROM to follow Disposition: Discharge with strict return precautions and instructions to follow up with primary MD within 48 hours for further evaluation including referral to an orthopedist.   FINAL CLINICAL IMPRESSION(S) / ED DIAGNOSES   Final diagnoses:  Finger dislocation, initial encounter   Rx / DC Orders   ED Discharge Orders     None      Note:  This document was prepared using Dragon voice recognition software and may include unintentional dictation errors.   Naaman Plummer, MD 12/12/22 [phone]

## 2022-12-14 ENCOUNTER — Ambulatory Visit (INDEPENDENT_AMBULATORY_CARE_PROVIDER_SITE_OTHER): Payer: 59 | Admitting: Gastroenterology

## 2022-12-14 ENCOUNTER — Telehealth (INDEPENDENT_AMBULATORY_CARE_PROVIDER_SITE_OTHER): Payer: Self-pay | Admitting: *Deleted

## 2022-12-14 VITALS — BP 82/49 | HR 92 | Temp 97.5°F | Ht 72.0 in | Wt 119.6 lb

## 2022-12-14 DIAGNOSIS — D649 Anemia, unspecified: Secondary | ICD-10-CM | POA: Diagnosis not present

## 2022-12-14 DIAGNOSIS — K7400 Hepatic fibrosis, unspecified: Secondary | ICD-10-CM

## 2022-12-14 DIAGNOSIS — R634 Abnormal weight loss: Secondary | ICD-10-CM

## 2022-12-14 DIAGNOSIS — Z8619 Personal history of other infectious and parasitic diseases: Secondary | ICD-10-CM | POA: Diagnosis not present

## 2022-12-14 DIAGNOSIS — K5900 Constipation, unspecified: Secondary | ICD-10-CM | POA: Diagnosis not present

## 2022-12-14 MED ORDER — PEG 3350-KCL-NA BICARB-NACL 420 G PO SOLR: 4000.0000 mL | Freq: Once | ORAL | 0 refills | Status: AC

## 2022-12-14 NOTE — Progress Notes (Signed)
Referring Provider: Lorelee Market, MD Primary Care Physician:  Lorelee Market, MD Primary GI Physician: new  Chief Complaint  Patient presents with   Weight Loss   HPI:   Ian Medina is a 67 y.o. male with past medical history of Hepatitis C successfully treated in 2018 with Harvoni. Genotype 1A,  schizoaffective disorder, mild intellectual disability, nicotine dependence, COPD, generalized anxiety disorder , hypertension.   Patient presenting today for weight loss   DG Hip/pelvis on 12/11/22 notably showed large stool ball in rectum and large burden of stool in portions of the colon.   Appears to be down to 119 lbs from 151 lbs in November 2023. Last labs December 2023 with hgb 10.6 , renal and Liver function WNL  TSH in October was 2.267 [place] abd w/elastography in 2017 with possible F2+F3 fibrosis/nodular contour of liver, repeat US in 2019 with normal appearing liver.  Present: Patient arrives with caregiver Byrd Hesselbach, from [facility]. Caregiver states that patient has been at the facility for a few weeks. Due to patient's known mental illness, history is difficult to obtain from him. She notes that he has not been eating much recently. She is unsure how often he is having a BM.  He reports the does not feel hungry. Caregiver states that he will drink liquids but dose not eat.   I was able to connect with shavonne womack (Associate Professor) at the facility via telephone, she states due to findings of constipation on recent xray (as above) patient  had enema yesterday and had a large BM.  he was recently started on a bowel regimen with bisacodyl every other day. having BMs every 1-3 days. Patient previously living in a group home, recently hospitalized and had failure to thrive then which is when he began losing weight. He recently came to his current facility about 2 weeks ago.  He has only lost about 3-4 lbs since arriving to them. She reports he is not eating well other  than his desserts, but will drink liquids without issue. No vomiting and patient has not reported nausea.  No obvious BRBPR or black stools. Staff reports BP is typically fairly low, SBP around 90. He has had 3 falls in the past 4-5 days, is on midodrine for his BP and this is being monitored by provider at the facility. Had a fall Sunday where he obtained a hip fracture. She reports that patient is cooperative but is impulsive. Notably is on thickened liquids, provider at facility did not think patient would tolerate miralax thickened which is why they chose bisacodyl.  Patient himself denies abdominal pain or nausea. States he does not have much of an appetite but is thirsty, notably asking for mountain dew during the visit. Patient denies dizziness, repeatedly trying to get out of his wheelchair during the visit.    Last Colonoscopy:09/2010-internal hemorrhoids     Last Endoscopy:?  Recommendations:    Past Medical History:  Diagnosis Date   Anxiety    Atherosclerosis of aorta (HCC)    Auditory hallucination    Cerebral aneurysm, nonruptured    COPD with emphysema (HCC)    Drug abuse (HCC)    Dysphagia, oropharyngeal phase    GAD (generalized anxiety disorder)    GERD (gastroesophageal reflux disease)    Hepatitis C    Hypercholesterolemia    Hypertension    Middle cerebral artery aneurysm    Mild intellectual disabilities    Paranoia (HCC)    Pneumonia 2017  Psoriasis    Psoriasis vulgaris    Repeated falls    Schizoaffective disorder, depressive type (HCC)    Schizophrenia (HCC)    diagnosed at age 66, treated by ACT team    Past Surgical History:  Procedure Laterality Date   CHEST TUBE INSERTION     COLONOSCOPY  Nov 2011   Dr. Samuella Cota: normal, internal hemorrhoids   COLONOSCOPY  Sept 2009   small internal hemorrhoids, normal    ESOPHAGOGASTRODUODENOSCOPY  June 2010   Dr. Samuella Cota: normal   ESOPHAGOGASTRODUODENOSCOPY  Jan 2009   Dr. Allena Katz: linear esophagitis, mild  stricture, diffuse gastritis and mild duodenitis, PATH: chronic gastritis, negative H.pylori    FOOT SURGERY     right    Current Outpatient Medications  Medication Sig Dispense Refill   albuterol (VENTOLIN HFA) 108 (90 Base) MCG/ACT inhaler Inhale 2 puffs into the lungs every 6 (six) hours as needed for wheezing or shortness of breath. 8 g 5   aspirin 81 MG chewable tablet Chew 81 mg by mouth daily.     benztropine (COGENTIN) 0.5 MG tablet Take 1 tablet (0.5 mg total) by mouth 2 (two) times daily. 60 tablet 0   cetirizine (ZYRTEC) 10 MG tablet Take 1 tablet (10 mg total) by mouth daily. 30 tablet 0   chlorproMAZINE (THORAZINE) 25 MG tablet Take 1 tablet (25 mg total) by mouth daily. 30 tablet 0   chlorproMAZINE (THORAZINE) 25 MG tablet Take 1 tablet (25 mg total) by mouth at bedtime as needed (agitation). 30 tablet 0   FLUoxetine (PROZAC) 20 MG capsule Take 1 capsule (20 mg total) by mouth daily. 30 capsule 0   Fluticasone-Umeclidin-Vilant (TRELEGY ELLIPTA) 100-62.5-25 MCG/ACT AEPB Inhale 1 puff into the lungs daily.     gabapentin (NEURONTIN) 100 MG capsule Take 100 mg by mouth 3 (three) times daily.     levETIRAcetam (KEPPRA) 500 MG tablet Take 1 tablet (500 mg total) by mouth 2 (two) times daily. 60 tablet 0   lovastatin (MEVACOR) 20 MG tablet Take 1 tablet (20 mg total) by mouth daily. (Patient taking differently: Take 20 mg by mouth every evening.) 30 tablet 0   midodrine (PROAMATINE) 10 MG tablet Take 1 tablet (10 mg total) by mouth 3 (three) times daily with meals. 90 tablet 0   montelukast (SINGULAIR) 10 MG tablet Take 1 tablet (10 mg total) by mouth at bedtime. (Patient taking differently: Take 10 mg by mouth every evening.) 30 tablet 0   Multiple Vitamin (MULTIVITAMIN WITH MINERALS) TABS tablet Take 1 tablet by mouth daily.     OLANZapine (ZYPREXA) 20 MG tablet Take 1 tablet (20 mg total) by mouth at bedtime. 30 tablet 0   pantoprazole (PROTONIX) 40 MG tablet Take 1 tablet (40 mg  total) by mouth daily. 30 tablet 0   tamsulosin (FLOMAX) 0.4 MG CAPS capsule Take 1 capsule (0.4 mg total) by mouth every evening. 30 capsule 0   traZODone (DESYREL) 50 MG tablet Take 1 tablet (50 mg total) by mouth at bedtime. 30 tablet 0   valbenazine (INGREZZA) 40 MG capsule Take 40 mg by mouth daily.     No current facility-administered medications for this visit.    Allergies as of 12/14/2022 - Review Complete 12/14/2022  Allergen Reaction Noted   Propoxyphene Rash 05/02/2022    Family History  Problem Relation Age of Onset   COPD Father    Colon cancer Neg Hx     Social History   Socioeconomic History   Marital  status: Single    Spouse name: Not on file   Number of children: Not on file   Years of education: Not on file   Highest education level: Not on file  Occupational History   Not on file  Tobacco Use   Smoking status: Former    Packs/day: 1.00    Types: Cigarettes   Smokeless tobacco: Never  Vaping Use   Vaping Use: Never used  Substance and Sexual Activity   Alcohol use: No   Drug use: Not Currently    Comment: IV drug use in remote past   Sexual activity: Not on file  Other Topics Concern   Not on file  Social History Narrative   Not on file   Social Determinants of Health   Financial Resource Strain: Not on file  Food Insecurity: Not on file  Transportation Needs: Not on file  Physical Activity: Not on file  Stress: Not on file  Social Connections: Not on file   Review of systems General: negative for malaise, night sweats, fever, chills, +weight loss Neck: Negative for lumps, goiter, pain and significant neck swelling Resp: Negative for cough, wheezing, dyspnea at rest CV: Negative for chest pain, leg swelling, palpitations, orthopnea GI: denies melena, hematochezia, nausea, vomiting, diarrhea, dysphagia, odyonophagia, early satiety or unintentional weight loss. +constipation  MSK: Negative for joint pain or swelling, back pain, and muscle  pain. Derm: Negative for itching or rash Psych: Denies depression, anxiety, memory loss, confusion. No homicidal or suicidal ideation.  Heme: Negative for prolonged bleeding, bruising easily, and swollen nodes. Endocrine: Negative for cold or heat intolerance, polyuria, polydipsia and goiter. Neuro: negative for tremor, gait imbalance, syncope and seizures. The remainder of the review of systems is noncontributory.  Physical Exam: BP (!) 82/49 (BP Location: Left Arm, Patient Position: Sitting, Cuff Size: Normal)   Pulse 92   Ht 6' (1.829 m)   Wt 119 lb 9.6 oz (54.3 kg)   SpO2 98%   BMI 16.22 kg/m  General:   Alert. Impulsive. In wheelchair. Disheveled  Head:  Normocephalic and atraumatic. Eyes:  Conjuctiva clear without scleral icterus. Mouth:  Oral mucosa pink and moist. Good dentition. No lesions. Heart: Normal rate and rhythm, s1 and s2 heart sounds present.  Lungs: Clear lung sounds in all lobes. Respirations equal and unlabored. Abdomen:  +BS, soft, non-tender and non-distended. No rebound or guarding. No HSM or masses noted. Derm: No palmar erythema or jaundice Msk:  Symmetrical without gross deformities. Normal posture. Extremities:  Without edema. Neurologic:  Alert. Oriented to person and place. Psych:  Alert. Somewhat uncooperative. Impulsive.    Invalid input(s): "6 MONTHS"   ASSESSMENT: Curtiss Mahmood is a 67 y.o. male presenting today as a new patient for weight loss.  Weight loss/failure to thrive: multiple recent ER visits as well as hospital admission, weight loss suspected during admission to be secondary to chronic illness/failure to thrive. Appears he was around 150 lbs in November, 119 lbs today. Per report, notably with very little food intake though drinking liquids without issue. Suspect weight loss I multifactorial in setting of mental illness, chronic disease and failure to thrive. No rectal bleeding or melena reported. Last TCS 2011, I did recommend updating  colonoscopy especially in presence of significant weight loss.   Anemia: hgb down to the 8 range, most recently back up to 10.6. in December, no rectal bleeding or melena reported. Patient denies dizziness or lightheadedness. Will check iron studies, recommended colonoscopy, as above, may also benefit  from EGD if iron studies reveal IDA.   Constipation: recent xray with stool ball/constipation. Enema yesterday with large BM. Provider facility has started bowel regimen with bisacodyl as patient is on thickened liquids they wanted to avoid using mirlax. Can continue bisacodyl if this is providing good results, should ensure patient is continuing to stay well hydrated, trying to encourage plenty of fruits, veggies and whole grains in his diet.   History of Hepatitis C/Liver fibrosis F2/F3: history of Hep C, s/p treatment with SVR achieved in March 2018, though last elastography in 2017 with F2/F3 fibrosis. US liver in 2019 was normal, though no repeat elastography. Liver enzymes normal other than T bili of 1.2 in Nov 2023. Recommend repeat US liver elastography to evaluate for worsening fibrosis/presence of cirrhosis. I discussed this with Caregiver present and Osborne Casco, unit manager at the facility.   Notably, patient's BP quite low during visit today. He denied any dizziness or lightheadedness, he was alert and actually attempting to get out of his wheelchair throughout the visit.  I did recommend further evaluation of BP to the Unit manager Shavonne/proceeding to the ER if pt became symptomatic (dizziness, lightheadedness, drowsiness) especially as patient has had multiple falls recently, hypotension likely contributing. She advised they are keeping a close eye on BP as hypotension is not a new finding and Facility provider will be made aware of hypotension during the visit today.   PLAN:  Colonoscopy- ASA III ENDO 3 2. Iron studies  3. US liver elastography 4. Boost/ensure BID-TID 5. Bowel  regimen and good water intake, diet high in fruits, veggies, whole grains  6. Hospital precautions for hypotension** 7. Consider EGD as well if iron studies low  All questions were answered, patient verbalized understanding and is in agreement with plan as outlined above.   Follow Up: 3 months   Jeevan Kalla L. Andreana Klingerman, MSN, APRN, AGNP-C Adult-Gerontology Nurse Practitioner [facility]  I have reviewed the note and agree with the APP's assessment as described in this progress note  Given evidence of fibrosis in the past, I will recommend having further evaluation with elastography but it would be important to keep performing ultrasounds every 6 months as the patient had possible F3 fibrosis in the past.  Maylon Peppers, MD [facility]astro[facility]

## 2022-12-14 NOTE — Patient Instructions (Signed)
I am ordering iron studies as patient has had some recent anemia Recommend proceeding with Colonoscopy as last was in 2011 Continue with bowel regimen and good water intake for constipation Boost/ensure protein shakes BID-TID for added nutrition Needs US liver elastography given history of Hepatitis C and liver fibrosis  As discussed BP is quite low today. Patient is denying dizziness/lightheadedness, concerning that this could be contributing to recent falls. Recommend facility provider keep a close eye on this, if patient becomes symptomatic he should be taken to the ER for further evaluation of hypotension.**  Follow up 3 months

## 2022-12-14 NOTE — Telephone Encounter (Signed)
[facility] [phone] and spoke with Magda Paganini and scheduled TCS with Dr. Jenetta Downer, ASA 3 on 2/13 at 2pm. She advised me to send rx to [facility]. Fax instructions/pre-op to her at [phone] along with pt [place] appt details.   PA submitted via [facility]. Auth# [id number], DOS: Jan 09, 2023 - Apr 09, 2023

## 2022-12-15 ENCOUNTER — Encounter: Payer: Self-pay | Admitting: *Deleted

## 2022-12-17 DIAGNOSIS — D649 Anemia, unspecified: Secondary | ICD-10-CM | POA: Insufficient documentation

## 2022-12-17 DIAGNOSIS — K5900 Constipation, unspecified: Secondary | ICD-10-CM | POA: Insufficient documentation

## 2022-12-17 DIAGNOSIS — Z8619 Personal history of other infectious and parasitic diseases: Secondary | ICD-10-CM | POA: Insufficient documentation

## 2022-12-17 DIAGNOSIS — K7400 Hepatic fibrosis, unspecified: Secondary | ICD-10-CM | POA: Insufficient documentation

## 2022-12-25 ENCOUNTER — Non-Acute Institutional Stay: Payer: 59 | Admitting: Nurse Practitioner

## 2022-12-25 ENCOUNTER — Encounter: Payer: Self-pay | Admitting: Nurse Practitioner

## 2022-12-25 VITALS — BP 122/68 | HR 76 | Temp 97.6°F | Resp 18 | Wt 117.6 lb

## 2022-12-25 DIAGNOSIS — R0602 Shortness of breath: Secondary | ICD-10-CM

## 2022-12-25 DIAGNOSIS — Z515 Encounter for palliative care: Secondary | ICD-10-CM

## 2022-12-25 DIAGNOSIS — J449 Chronic obstructive pulmonary disease, unspecified: Secondary | ICD-10-CM

## 2022-12-25 DIAGNOSIS — R630 Anorexia: Secondary | ICD-10-CM

## 2022-12-25 DIAGNOSIS — R5381 Other malaise: Secondary | ICD-10-CM

## 2022-12-25 NOTE — Progress Notes (Signed)
[facility] Consult Note Telephone: [phone]  Fax: [phone]    Date of encounter: 12/25/22 8:30 PM PATIENT NAME: Ian Medina [place] [address]   [phone] (home)  DOB: 1956-11-16 MRN: [id number] PRIMARY CARE PROVIDER:    [facility]  RESPONSIBLE PARTY:    Contact Information     Name Relation Home Work Mobile   nines,carrie Legal [phone]     I met face to face with patient in facility. Palliative Care was asked to follow this patient by consultation request of  Michiel Cowboy, MD/[facility]CC to address advance care planning and complex medical decision making. This is the initial visit.                           ASSESSMENT AND PLAN / RECOMMENDATIONS:  Symptom Management/Plan: 1. Advance Care Planning;  Ongoing discussion 2. Palliative care encounter; Palliative care encounter; Palliative medicine team will continue to support patient, patient's family, and medical team. Visit consisted of counseling and education dealing with the complex and emotionally intense issues of symptom management and palliative care in the setting of serious and potentially life-threatening illness 3 Debility secondary to decompensation, recent hospitalization, continues ongoing decline functionally. Continue to encourage therapy PT/OT as able, monitor progress. 4. Anorexia secondary to copd continue to monitor weights, appetite %, supplements, encourage Ian Medina to eat, reviewed weights.  03/2022 weight 200 lbs 11/10/2023 weight 123 lbs 11/28/2022 weight 119.7 lbs 12/20/2022 weight 117.6 lbs 82.4 lbs/12 months; 41.20% 5.4 lbs/6 weeks; 4.39% 2.1 lbs/3 weeks; 1.75% 5. Sob secondary to copd, stable, continue to monitor O2 saturations, inhalation therapy, respiratory status.    Follow up Palliative Care Visit: Palliative care will continue to follow for complex medical decision making, advance care  planning, and clarification of goals. Return 4 to 8  weeks or prn. Continue to monitor, follow with PC with therapy, monitoring weights, appetite, overall decline, debility.    I spent 47 minutes providing this consultation starting at 11:00 am. More than 50% of the time in this consultation was spent in counseling and care coordination. PPS: 40% Chief Complaint: Initial palliative consult for complex medical decision making, address goals, manage ongoing symptoms   HISTORY OF PRESENT ILLNESS:  Ian Medina is a 67 y.o. year old male  with multiple medical problems including schizoaffective disorder, mild intellectual disability, nicotine dependence, COPD, generalized anxiety disorder , hypertension, hepatitis C, group home resident with guardians through [place]. Ian Medina resides at [facility] currently for STR. Ian Medina was hospitalized from 10/24/2022 to 11/09/2022 from a group home for acute metabolic encephalopathy with urinary retention in the setting of BPH, hypotension, falls, dysphagia, severe protein calorie malnutrition. CT's of head, neck and face showed only a R zygomatic fracture. He was d/c to STR at [facility]. Ian Medina requires assistance with transfers, mobility, adl's, bathing, dressing. Ian Medina does feed himself with poor appetite, ongoing decline. Ian Medina continues to attempt to participate in therapy. Ian Medina was seen in ED from 12/11/2022 to 12/12/2022 for mechanical fall with workup significant for acute greater trochanter avulsion fracture, stabilized and f/u Ortho Out patient. Ian Medina returned to ED for deformity to right finger with workup revealed dislocation which was reduced, immobilized and sling with d/c back to [facility]. Purpose of today PC f/u visit further discussion monitor trends of appetite, weights, monitor for functional, cognitive decline with chronic disease progression, assess any  active symptoms, supportive role. Ian Medina is currently lying in bed, sleeping. Ian  Medina does awake to verbal cues. Ian Medina does make eye contact, reached out for hand. Ian Medina was engaging though limited verbal discussion with cognitive impairment. ROS, reviewed medical goals, poc, medications. Support provided. Will f/u with [facility] Guardian for discussion of code status.  PC f/u visit further discussion monitor trends of appetite, weights, monitor for functional, cognitive decline with chronic disease progression, assess any active symptoms, supportive role. Updated staff.   History obtained from review of EMR, discussion with primary team, and interview with family, facility staff/caregiver and/or Ian. Medina.  I reviewed available labs, medications, imaging, studies and related documents from the EMR.  Records reviewed and summarized above.  Physical Exam: Constitutional: NAD General: frail appearing, thin pleasant male, difficult to understand language, mumbles ENMT: oral mucous membranes moist CV: S1S2, RRR Pulmonary: decreased bases MSK: w/c Skin: warm and dry Neuro:  +generalized weakness Psych: non-anxious affect, A and Oriented to self Thank you for the opportunity to participate in the care of Ian Medina. Please call our office at [phone] if we can be of additional assistance.   Ciearra Rufo Ihor Gully, NP

## 2022-12-26 ENCOUNTER — Ambulatory Visit (HOSPITAL_COMMUNITY)
Admission: RE | Admit: 2022-12-26 | Discharge: 2022-12-26 | Disposition: A | Discharge status: Home / Self Care | Payer: 59 | Source: Ambulatory Visit | Attending: Gastroenterology | Admitting: Gastroenterology

## 2022-12-26 DIAGNOSIS — Z8619 Personal history of other infectious and parasitic diseases: Secondary | ICD-10-CM | POA: Diagnosis present

## 2022-12-26 DIAGNOSIS — K7400 Hepatic fibrosis, unspecified: Secondary | ICD-10-CM | POA: Insufficient documentation

## 2023-01-04 ENCOUNTER — Other Ambulatory Visit (INDEPENDENT_AMBULATORY_CARE_PROVIDER_SITE_OTHER): Payer: Self-pay | Admitting: Gastroenterology

## 2023-01-04 DIAGNOSIS — R932 Abnormal findings on diagnostic imaging of liver and biliary tract: Secondary | ICD-10-CM

## 2023-01-04 DIAGNOSIS — K7400 Hepatic fibrosis, unspecified: Secondary | ICD-10-CM

## 2023-01-05 ENCOUNTER — Telehealth: Payer: Self-pay | Admitting: *Deleted

## 2023-01-05 MED ORDER — PEG 3350-KCL-NA BICARB-NACL 420 G PO SOLR: 4000.0000 mL | Freq: Once | ORAL | 0 refills | Status: AC

## 2023-01-05 NOTE — Patient Instructions (Addendum)
   Your procedure is scheduled on: 01/09/2023  Report to [facility] at     12:00 PM.  Call this number if you have problems the morning of surgery: [phone]   Remember:              Follow Directions on the letter you received from Your Physician's office regarding the Bowel Prep              No Smoking the day of Procedure :   Take these medicines the morning of surgery with A SIP OF WATER: Zyrtec, prozoc, Thorazine, finasteride, Gabapentin, Keppra, omeprazole, and ingrezza  Use inhalers if needed   Do not wear jewelry, make-up or nail polish.    Do not bring valuables to the hospital.  Contacts, dentures or bridgework may not be worn into surgery.  .   Patients discharged the day of surgery will not be allowed to drive home.     Colonoscopy, Adult, Care After This sheet gives you information about how to care for yourself after your procedure. Your health care provider may also give you more specific instructions. If you have problems or questions, contact your health care provider. What can I expect after the procedure? After the procedure, it is common to have: A small amount of blood in your stool for 24 hours after the procedure. Some gas. Mild abdominal cramping or bloating.  Follow these instructions at home: General instructions  For the first 24 hours after the procedure: Do not drive or use machinery. Do not sign important documents. Do not drink alcohol. Do your regular daily activities at a slower pace than normal. Eat soft, easy-to-digest foods. Rest often. Take over-the-counter or prescription medicines only as told by your health care provider. It is up to you to get the results of your procedure. Ask your health care provider, or the department performing the procedure, when your results will be ready. Relieving cramping and bloating Try walking around when you have cramps or feel bloated. Apply heat to your abdomen as told by your health  care provider. Use a heat source that your health care provider recommends, such as a moist heat pack or a heating pad. Place a towel between your skin and the heat source. Leave the heat on for 20-30 minutes. Remove the heat if your skin turns bright red. This is especially important if you are unable to feel pain, heat, or cold. You may have a greater risk of getting burned. Eating and drinking Drink enough fluid to keep your urine clear or pale yellow. Resume your normal diet as instructed by your health care provider. Avoid heavy or fried foods that are hard to digest. Avoid drinking alcohol for as long as instructed by your health care provider. Contact a health care provider if: You have blood in your stool 2-3 days after the procedure. Get help right away if: You have more than a small spotting of blood in your stool. You pass large blood clots in your stool. Your abdomen is swollen. You have nausea or vomiting. You have a fever. You have increasing abdominal pain that is not relieved with medicine. This information is not intended to replace advice given to you by your health care provider. Make sure you discuss any questions you have with your health care provider. Document Released: 06/27/2004 Document Revised: 08/07/2016 Document Reviewed: 01/25/2016 Elsevier Interactive Patient Education  [employer].

## 2023-01-05 NOTE — Telephone Encounter (Signed)
Shevonne called. The pharmacy needs them to submit rx for prep to pharmacy. I had Kristen review chart and signed rx for me to fax to her at [phone].rx faxed.

## 2023-01-08 ENCOUNTER — Encounter (HOSPITAL_COMMUNITY)
Admission: RE | Admit: 2023-01-08 | Discharge: 2023-01-08 | Disposition: A | Discharge status: Home / Self Care | Payer: 59 | Source: Ambulatory Visit | Attending: Gastroenterology | Admitting: Gastroenterology

## 2023-01-08 DIAGNOSIS — R634 Abnormal weight loss: Secondary | ICD-10-CM

## 2023-01-08 DIAGNOSIS — D649 Anemia, unspecified: Secondary | ICD-10-CM

## 2023-01-09 ENCOUNTER — Encounter (HOSPITAL_COMMUNITY): Payer: Self-pay | Admitting: Certified Registered"

## 2023-01-09 ENCOUNTER — Ambulatory Visit (HOSPITAL_COMMUNITY): Admission: RE | Admit: 2023-01-09 | Payer: 59 | Source: Home / Self Care | Admitting: Gastroenterology

## 2023-01-09 ENCOUNTER — Encounter (HOSPITAL_COMMUNITY): Admission: RE | Payer: Self-pay | Source: Home / Self Care

## 2023-01-09 SURGERY — COLONOSCOPY WITH PROPOFOL
Anesthesia: Monitor Anesthesia Care

## 2023-01-09 NOTE — Progress Notes (Signed)
Pt is a no show for his colonoscopy procedure. Talked with Ian Medina at [facility] where pt is a resident. Ms Hall Busing stated that Dr Ila Mcgill, pt's physician, canceled the procedure, said he did not need it done.

## 2023-01-18 ENCOUNTER — Encounter: Payer: Self-pay | Admitting: Nurse Practitioner

## 2023-01-18 ENCOUNTER — Non-Acute Institutional Stay: Payer: 59 | Admitting: Nurse Practitioner

## 2023-01-18 DIAGNOSIS — R5381 Other malaise: Secondary | ICD-10-CM

## 2023-01-18 DIAGNOSIS — Z515 Encounter for palliative care: Secondary | ICD-10-CM

## 2023-01-18 DIAGNOSIS — R630 Anorexia: Secondary | ICD-10-CM

## 2023-01-18 DIAGNOSIS — J449 Chronic obstructive pulmonary disease, unspecified: Secondary | ICD-10-CM

## 2023-01-18 NOTE — Progress Notes (Addendum)
[facility] Consult Note Telephone: [phone]  Fax: [phone]    Date of encounter: 01/18/23 12:05 PM PATIENT NAME: Ian Medina [place] [address]   [phone] (home)  DOB: 05/22/1956 MRN: [id number] PRIMARY CARE PROVIDER:    [facility]   RESPONSIBLE PARTY:    Contact Information     Name Relation Home Work Mobile   nines,carrie Legal Guardian   [phone]     I met face to face with patient in facility. Palliative Care was asked to follow this patient by consultation request of  Michiel Cowboy, MD/AHCC to address advance care planning and complex medical decision making. This is the initial visit.                           ASSESSMENT AND PLAN / RECOMMENDATIONS:  Symptom Management/Plan: 1. Advance Care Planning;  DNR With significant functional, cognitive, chronic disease progression to end part of his life and weight changes would recommend looking at hospice services under Medicare benefit to increase services at [facility] to focus on comfort, minimize suffering and bring quality of care to his life.  81.1 lbs/9 months; 40.55 % 4.1 lbs/2 months; 0.67% 4 months ago PPS 60% Currently PPS 30%  2. Palliative care encounter; Palliative care encounter; Palliative medicine team will continue to support patient, patient's family, and medical team. Visit consisted of counseling and education dealing with the complex and emotionally intense issues of symptom management and palliative care in the setting of serious and potentially life-threatening illness 3 Debility secondary to decompensation, recent hospitalization, continues ongoing decline functionally. Continue to encourage restorative exercises; has completed therapy though difficulty with processing.   4. Anorexia slight weight gain though significant amount of weight. secondary to copd continue to monitor weights,  appetite %, supplements, encourage Ian Fischbeck to eat, reviewed weights.  03/2022 weight 200 lbs 11/10/2023 weight 123 lbs 11/28/2022 weight 119.7 lbs 01/01/2023 weight 118.9 lbs 81.1 lbs/9 months; 40.55 % 4.1 lbs/2 months; 0.67% 5. Sob secondary to copd, stable, continue to monitor O2 saturations, inhalation therapy, respiratory status.    Follow up Palliative Care Visit: Palliative care will continue to follow for complex medical decision making, advance care planning, and clarification of goals. Return 4 to 8  weeks or prn. Continue to monitor, follow with PC with therapy, monitoring weights, appetite, overall decline, debility.    I spent 45 minutes providing this consultation starting at 11:45am. More than 50% of the time in this consultation was spent in counseling and care coordination. PPS: 40% Chief Complaint: Initial palliative consult for complex medical decision making, address goals, manage ongoing symptoms   HISTORY OF PRESENT ILLNESS:  Ian Medina is a 67 y.o. year old male  with multiple medical problems including schizoaffective disorder, mild intellectual disability, nicotine dependence, COPD, generalized anxiety disorder , hypertension, hepatitis C, group home resident with guardians through [place]. Ian Medina resides at [facility]. Ian Medina was seen in ED from 12/11/2022 to 12/12/2022 for mechanical fall with workup significant for acute greater trochanter avulsion fracture, stabilized and f/u Ortho Out patient. Ian Medina returned to ED for deformity to right finger with workup revealed dislocation which was reduced, immobilized and sling with d/c back to [facility]. Purpose of today PC f/u visit further discussion monitor trends of appetite, weights, monitor for functional, cognitive decline with chronic disease progression, assess any active symptoms, supportive role. At present  Ian Medina is lying in bed, appears debilitated, thin, no distress. Ian Medina makes eye contact, verbally engaging  with nonsense words. Ian Medina tried to be interactive though limited with cognitive impairment. Ian Medina was cooperative with assessment. Support provided. Will contact [facility] for update. Medications, medical goals reviewed and poc. May benefit from hospice.    History obtained from review of EMR, discussion with primary team, and interview with family, facility staff/caregiver and/or Ian. Marschall.  I reviewed available labs, medications, imaging, studies and related documents from the EMR.  Records reviewed and summarized above.  Physical Exam: Constitutional: NAD General: frail appearing, thin pleasant male, difficult to understand language, mumbles ENMT: oral mucous membranes moist CV: S1S2, RRR Pulmonary: decreased bases MSK: w/c Skin: warm and dry Neuro:  +generalized weakness Psych: non-anxious affect, A and Oriented to self Thank you for the opportunity to participate in the care of Ian. Medina. Please call our office at [phone] if we can be of additional assistance.   Maijor Hornig Ihor Gully, NP

## 2023-01-19 ENCOUNTER — Non-Acute Institutional Stay: Payer: 59 | Admitting: Nurse Practitioner

## 2023-01-19 ENCOUNTER — Encounter: Payer: Self-pay | Admitting: Nurse Practitioner

## 2023-01-19 VITALS — BP 101/64 | HR 76 | Temp 97.5°F | Resp 18 | Wt 118.9 lb

## 2023-01-19 DIAGNOSIS — R0602 Shortness of breath: Secondary | ICD-10-CM

## 2023-01-19 DIAGNOSIS — Z515 Encounter for palliative care: Secondary | ICD-10-CM

## 2023-01-19 DIAGNOSIS — J449 Chronic obstructive pulmonary disease, unspecified: Secondary | ICD-10-CM

## 2023-01-19 DIAGNOSIS — R630 Anorexia: Secondary | ICD-10-CM

## 2023-01-19 DIAGNOSIS — R5381 Other malaise: Secondary | ICD-10-CM

## 2023-01-19 NOTE — Progress Notes (Signed)
[facility] Consult Note Telephone: [phone]  Fax: [phone]    Date of encounter: 01/19/23 7:50 PM PATIENT NAME: Robyn Haber [place] [address]   [phone] (home)  DOB: 02/03/1956 MRN: [id number] PRIMARY CARE PROVIDER:    [facility]  RESPONSIBLE PARTY:    Contact Information     Name Relation Home Work Mobile   nines,carrie Legal Guardian   [phone]     I met face to face with patient in facility. Palliative Care was asked to follow this patient by consultation request of  Michiel Cowboy, MD/AHCC to address advance care planning and complex medical decision making. This is the initial visit.                           ASSESSMENT AND PLAN / RECOMMENDATIONS:  Symptom Management/Plan: 1. Advance Care Planning;  DNR With significant functional, cognitive, chronic disease progression to end part of his life and weight changes would recommend looking at hospice services under Medicare benefit to increase services at [facility] to focus on comfort, minimize suffering and bring quality of care to his life.  81.1 lbs/9 months; 40.55 % 4.1 lbs/2 months; 0.67% 4 months ago PPS 60% Currently PPS 30%   2. Palliative care encounter; Palliative care encounter; Palliative medicine team will continue to support patient, patient's family, and medical team. Visit consisted of counseling and education dealing with the complex and emotionally intense issues of symptom management and palliative care in the setting of serious and potentially life-threatening illness 3 Debility secondary to decompensation, recent hospitalization, continues ongoing decline functionally. Continue to encourage restorative exercises; has completed therapy though difficulty with processing.    4. Anorexia slight weight gain though significant amount of weight. secondary to copd continue to monitor weights,  appetite %, supplements, encourage Mr Charnley to eat, reviewed weights.  03/2022 weight 200 lbs 11/10/2023 weight 123 lbs 11/28/2022 weight 119.7 lbs 01/01/2023 weight 118.9 lbs 81.1 lbs/9 months; 40.55 % 4.1 lbs/2 months; 0.67% 5. Sob secondary to copd, stable, continue to monitor O2 saturations, inhalation therapy, respiratory status.    Follow up Palliative Care Visit: Palliative care will continue to follow for complex medical decision making, advance care planning, and clarification of goals. Return 4 to 8  weeks or prn. Continue to monitor, follow with PC with therapy, monitoring weights, appetite, overall decline, debility.    I spent 48 minutes providing this consultation starting at 11:45am. More than 50% of the time in this consultation was spent in counseling and care coordination. PPS: 40% Chief Complaint: Initial palliative consult for complex medical decision making, address goals, manage ongoing symptoms   HISTORY OF PRESENT ILLNESS:  Aric Ater is a 67 y.o. year old male  with multiple medical problems including schizoaffective disorder, mild intellectual disability, nicotine dependence, COPD, generalized anxiety disorder , hypertension, hepatitis C, group home resident with guardians through [facility]. Mr Ishikawa resides at [facility]. Mr Rodrigue was seen in ED from 12/11/2022 to 12/12/2022 for mechanical fall with workup significant for acute greater trochanter avulsion fracture, stabilized and f/u Ortho Out patient. Mr Lari returned to ED for deformity to right finger with workup revealed dislocation which was reduced, immobilized and sling with d/c back to [facility].   Purpose of today PC f/u visit further discussion monitor trends of appetite, weights, monitor for functional, cognitive decline with chronic disease progression, assess any active symptoms, supportive  role. At present Mr Vanfossan is sitting on the side of the bed, feeding himself lunch, engaging, interactive, making eye contact.    Mr Camera was cooperative with assessment. Support provided. Contacted [employer] DSS Guardian updated on clinical presentation.    History obtained from review of EMR, discussion with primary team, and interview with family, facility staff/caregiver and/or Mr. Enderson.  I reviewed available labs, medications, imaging, studies and related documents from the EMR.  Records reviewed and summarized above.  Physical Exam: Constitutional: NAD General: frail appearing, thin pleasant male, pleasant ENMT: oral mucous membranes moist CV: S1S2, RRR Pulmonary: clear Musculoskeletal: +muscle wasting Skin: warm and dry Neuro:  +generalized weakness Psych: non-anxious affect, A and Oriented to self Thank you for the opportunity to participate in the care of Mr. Dimiceli. Please call our office at [phone] if we can be of additional assistance.   Flavio Lindroth Ihor Gully, NP

## 2023-01-19 NOTE — Progress Notes (Signed)
Called back to [facility] health care and spoke with med tech who told me she would check to see if they had results and let me know in about 30 mins after she finished her med pass.

## 2023-01-30 ENCOUNTER — Non-Acute Institutional Stay: Payer: 59 | Admitting: Nurse Practitioner

## 2023-01-30 VITALS — BP 112/60 | HR 76 | Temp 97.7°F | Resp 18 | Wt 118.9 lb

## 2023-01-30 DIAGNOSIS — R5381 Other malaise: Secondary | ICD-10-CM

## 2023-01-30 DIAGNOSIS — R0602 Shortness of breath: Secondary | ICD-10-CM

## 2023-01-30 DIAGNOSIS — R630 Anorexia: Secondary | ICD-10-CM

## 2023-01-30 DIAGNOSIS — J449 Chronic obstructive pulmonary disease, unspecified: Secondary | ICD-10-CM

## 2023-01-30 NOTE — Progress Notes (Signed)
[facility] Consult Note Telephone: [phone]  Fax: [phone]    Date of encounter: 01/30/23 4:07 PM PATIENT NAME: Ian Medina [place] [address]   [phone] (home)  DOB: 04-23-1956 MRN: [id number] PRIMARY CARE PROVIDER:    [facility]:    Contact Information     Name Relation Home Work Mobile   nines,carrie Legal [phone]     I met face to face with patient in facility. Palliative Care was asked to follow this patient by consultation request of  Michiel Cowboy, MD/AHCC to address advance care planning and complex medical decision making. This is the initial visit.                           ASSESSMENT AND PLAN / RECOMMENDATIONS:  Symptom Management/Plan: 1. Advance Care Planning;  DNR Recommendation to look at hospice services under Medicare benefit as an option due to significant weight loss, chronic progression in disease functionally, cognitively which would improve quality of life with hospice and minimize suffering with goals of care to focus on comfort, quality of life.  81.1 lbs/9 months; 40.55 % 4.1 lbs/2 months; 0.67% 5 months ago PPS 60% Currently PPS 40%   2. Palliative care encounter; Palliative care encounter; Palliative medicine team will continue to support patient, patient's family, and medical team. Visit consisted of counseling and education dealing with the complex and emotionally intense issues of symptom management and palliative care in the setting of serious and potentially life-threatening illness 3 Debility secondary to decompensation, recent hospitalization, continues ongoing decline functionally. Continue to encourage restorative exercises; has completed therapy though difficulty with processing.    4. Anorexia slight weight gain though significant amount of weight. secondary to copd continue to  monitor weights, appetite %, supplements, encourage Ian Ela to eat, reviewed weights.  03/2022 weight 200 lbs 11/10/2023 weight 123 lbs 11/28/2022 weight 119.7 lbs 01/01/2023 weight 118.9 lbs 81.1 lbs/9 months; 40.55 % 4.1 lbs/2 months; 0.67% 5. Sob secondary to copd, stable, continue to monitor O2 saturations, inhalation therapy, respiratory status.    Follow up Palliative Care Visit: Palliative care will continue to follow for complex medical decision making, advance care planning, and clarification of goals. Return 4 to 8  weeks or prn. Continue to monitor, follow with PC with therapy, monitoring weights, appetite, overall decline, debility.    I spent 45 minutes providing this consultation starting at 12:00pm. More than 50% of the time in this consultation was spent in counseling and care coordination. PPS: 40% Chief Complaint: Initial palliative consult for complex medical decision making, address goals, manage ongoing symptoms   HISTORY OF PRESENT ILLNESS:  Ian Medina is a 67 y.o. year old male  with multiple medical problems including schizoaffective disorder, mild intellectual disability, nicotine dependence, COPD, generalized anxiety disorder , hypertension, hepatitis C, group home resident with guardians through [facility]. Ian Medina resides at [facility]. Ian Medina was seen in ED from 12/11/2022 to 12/12/2022 for mechanical fall with workup significant for acute greater trochanter avulsion fracture, stabilized and f/u Ortho Out patient. Ian Medina returned to ED for deformity to right finger with workup revealed dislocation which was reduced, immobilized and sling with d/c back to [facility].  Purpose of today PC f/u visit further discussion monitor trends of appetite, weights, monitor for functional, cognitive decline with chronic disease progression, assess any active symptoms,  supportive role. At present Ian Medina is lying in bed, sleepy, makes eye contact. Ian Medina is able to answer a few  questions though today limited with cognitive impairment. Ian Medina was cooperative with assessment. Support provided. Will contact DSS Guardian for further discussion of goc, possible hospice. PC f/u visit further discussion monitor trends of appetite, weights, monitor for functional, cognitive decline with chronic disease progression, assess any active symptoms, supportive role.   History obtained from review of EMR, discussion with primary team, and interview with family, facility staff/caregiver and/or Ian. Dilauro.  I reviewed available labs, medications, imaging, studies and related documents from the EMR.  Records reviewed and summarized above.  Physical Exam: General: frail appearing, thin pleasant male, sleepy ENMT: oral mucous membranes moist CV: S1S2, RRR Pulmonary: clear Musculoskeletal: +muscle wasting Neuro:  +generalized weakness Psych: non-anxious affect, A and Oriented to self Thank you for the opportunity to participate in the care of Ian. Medina. Please call our office at [phone] if we can be of additional assistance.   Gabriella Guile Ihor Gully, NP

## 2023-03-01 ENCOUNTER — Non-Acute Institutional Stay: Payer: 59 | Admitting: Nurse Practitioner

## 2023-03-01 ENCOUNTER — Encounter: Payer: Self-pay | Admitting: Nurse Practitioner

## 2023-03-01 VITALS — BP 121/73 | HR 62 | Temp 98.0°F | Resp 18 | Wt 121.2 lb

## 2023-03-01 DIAGNOSIS — Z515 Encounter for palliative care: Secondary | ICD-10-CM

## 2023-03-01 DIAGNOSIS — R0602 Shortness of breath: Secondary | ICD-10-CM

## 2023-03-01 DIAGNOSIS — R5381 Other malaise: Secondary | ICD-10-CM

## 2023-03-01 DIAGNOSIS — J449 Chronic obstructive pulmonary disease, unspecified: Secondary | ICD-10-CM

## 2023-03-01 DIAGNOSIS — R630 Anorexia: Secondary | ICD-10-CM

## 2023-03-01 NOTE — Progress Notes (Signed)
[facility] Consult Note Telephone: [phone]  Fax: [phone]    Date of encounter: 03/01/23 1:12 PM PATIENT NAME: Ian Medina [id number]   [phone] (home)  DOB: 1956/06/30 MRN: [id number] PRIMARY CARE PROVIDER:    [facility]  RESPONSIBLE PARTY:    Contact Information     Name Relation Home Work Mobile   nines,carrie Legal [phone]     I met face to face with patient in facility. Palliative Care was asked to follow this patient by consultation request of  Michiel Cowboy, MD/AHCC to address advance care planning and complex medical decision making. This is the initial visit.                           ASSESSMENT AND PLAN / RECOMMENDATIONS:  Symptom Management/Plan: 1. Advance Care Planning;  DNR  With weight gain, 2.3 lbs weight gain/4 weeks, more alert, not sleeping as much per staff, will revisit recommendation for hospice with DSS Guardian.  Recommendation to look at hospice services under Medicare benefit as an option due to significant weight loss, chronic progression in disease functionally, cognitively which would improve quality of life with hospice and minimize suffering with goals of care to focus on comfort, quality of life.   81.1 lbs/9 months; 40.55 % 4.1 lbs/2 months; 0.67% 2.3 lbs weight gain/4 weeks  5 months ago PPS 60% Currently PPS 40%   2. Palliative care encounter; Palliative care encounter; Palliative medicine team will continue to support patient, patient's family, and medical team. Visit consisted of counseling and education dealing with the complex and emotionally intense issues of symptom management and palliative care in the setting of serious and potentially life-threatening illness 3 Debility secondary to decompensation, recent hospitalization, continues ongoing decline functionally. Continue to  encourage restorative exercises; has completed therapy though difficulty with processing.    4. Anorexia slight weight gain though significant amount of weight. secondary to copd continue to monitor weights, appetite %, supplements, encourage Mr Ducksworth to eat, reviewed weights.  03/2022 weight 200 lbs 11/10/2023 weight 123 lbs 11/28/2022 weight 119.7 lbs 01/01/2023 weight 118.9 lbs 01/29/2023 weight 121.2 lbs 5. Sob secondary to copd, stable, continue to monitor O2 saturations, inhalation therapy, respiratory status.    Follow up Palliative Care Visit: Palliative care will continue to follow for complex medical decision making, advance care planning, and clarification of goals. Return 4 to 8  weeks or prn. Continue to monitor, follow with PC with therapy, monitoring weights, appetite, overall decline, debility.    I spent 46 minutes providing this consultation starting at 11:30am. More than 50% of the time in this consultation was spent in counseling and care coordination. PPS: 40% Chief Complaint: Initial palliative consult for complex medical decision making, address goals, manage ongoing symptoms   HISTORY OF PRESENT ILLNESS:  Reynold Schlag is a 67 y.o. year old male  with multiple medical problems including schizoaffective disorder, mild intellectual disability, nicotine dependence, COPD, generalized anxiety disorder , hypertension, hepatitis C, group home resident with guardians through [facility] [place]. Mr Argent resides at [facility]. Mr Ezernack was seen in ED from 12/11/2022 to 12/12/2022 for mechanical fall with workup significant for acute greater trochanter avulsion fracture, stabilized and f/u Ortho Out patient. Mr Vecchiarelli returned to ED for deformity to right finger with workup revealed dislocation which was reduced, immobilized and sling with [facility]/c back to  [facility].  Purpose of today PC f/u visit further discussion monitor trends of appetite, weights, monitor for functional, cognitive decline with chronic disease  progression, assess any active symptoms, supportive role. At present Mr Capel is sitting in the TV room at the facility. Mr Labine appears engaging, interactive, making eye contact, asking and answering questions. "I am trying to gain weight". Mr Hartter was cooperative with assessment. Mr Spofford talked about food he would like to eat, drinking supplement. We talked about his routine, importance of being oob. Mr Flemming agree with being oob, though shared some days he is tired. Praised for being oob today and engaging in TV room with other residents. Support provided.   PC f/u visit further discussion monitor trends of appetite, weights, monitor for functional, cognitive decline with chronic disease progression, assess any active symptoms, supportive role.   History obtained from review of EMR, discussion with primary team, and interview with family, facility staff/caregiver and/or Mr. Courtwright.  I reviewed available labs, medications, imaging, studies and related documents from the EMR.  Records reviewed and summarized above.  Physical Exam: General: frail appearing, thin pleasant male ENMT: oral mucous membranes moist CV: S1S2, RRR Pulmonary: clear Musculoskeletal: +muscle wasting Neuro:  +generalized weakness Psych: non-anxious affect, A and Oriented to self Thank you for the opportunity to participate in the care of Mr. Delpriore. Please call our office at [phone] if we can be of additional assistance.   Andri Prestia Ihor Gully, NP

## 2023-03-19 ENCOUNTER — Encounter (INDEPENDENT_AMBULATORY_CARE_PROVIDER_SITE_OTHER): Payer: Self-pay | Admitting: Gastroenterology

## 2023-03-19 ENCOUNTER — Ambulatory Visit (INDEPENDENT_AMBULATORY_CARE_PROVIDER_SITE_OTHER): Payer: 59 | Admitting: Gastroenterology

## 2023-03-19 VITALS — BP 101/68 | HR 80 | Temp 97.4°F | Ht 72.0 in | Wt 136.3 lb

## 2023-03-19 DIAGNOSIS — Z8619 Personal history of other infectious and parasitic diseases: Secondary | ICD-10-CM | POA: Diagnosis not present

## 2023-03-19 DIAGNOSIS — R932 Abnormal findings on diagnostic imaging of liver and biliary tract: Secondary | ICD-10-CM | POA: Insufficient documentation

## 2023-03-19 DIAGNOSIS — D649 Anemia, unspecified: Secondary | ICD-10-CM | POA: Diagnosis not present

## 2023-03-19 MED ORDER — PEG 3350-KCL-NA BICARB-NACL 420 G PO SOLR: 4000.0000 mL | Freq: Once | ORAL | 0 refills | Status: AC

## 2023-03-19 NOTE — Patient Instructions (Signed)
Recommend repeating hemoglobin, iron studies and checking fibro testing to rule out cirrhosis as last US of the liver was not conclusive Recommend screening colonoscopy as last was in 2011 He has gained some weight which is great, should continue to liberalize diet and allow patient to eat what he is willing/able to Will need repeat US of liver in July 2024  Follow up 3 months

## 2023-03-19 NOTE — Progress Notes (Unsigned)
Referring Provider: Lorelee Market, MD Primary Care Physician:  Lorelee Market, MD Primary GI Physician: Levon Hedger    Chief Complaint  Patient presents with   Weight Loss    Patient arrives with [name] from [facility]. Follow up on weight loss.    HPI:   Ian Medina is a 67 y.o. male with past medical history of Hepatitis C successfully treated in 2018 with [facility]. Genotype 1A,  schizoaffective disorder, mild intellectual disability, nicotine dependence, COPD, generalized anxiety disorder , hypertension.   Patient presenting today for follow up of weight loss.   Last seen January 2024, arrived with caregiver Byrd Hesselbach, from [facility]. Caregiver states that patient has been at the facility for a few weeks. Due to patient's known mental illness, history is difficult to obtain from him. She notes that he has not been eating much recently. 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  he was recently started on a bowel regimen with bisacodyl every other day. having BMs every 1-3 days. Patient previously living in a group home, recently hospitalized and had failure to thrive then which is when he began losing weight.   He has only lost about 3-4 lbs since arriving to them. on midodrine for his BP and this is being monitored by provider at the facility. Had a fall Sunday where he obtained a hip fracture. She reports that patient is cooperative but is impulsive. Notably is on thickened liquids, provider at facility did not think patient would tolerate miralax thickened which is why they chose bisacodyl. Patient himself denies abdominal pain or nausea. States he does not have much of an appetite but is thirsty, notably asking for mountain dew during the visit. Patient denies dizziness, repeatedly trying to get out of his  wheelchair during the visit. Recent hgb at that time was 10.6 in December.   Recommended colonoscopy, iron studies, US liver elastography, boost ensure/ BID-TID, bowel regimen, consider EGD if iron studies low.   He did not have colonoscopy or iron studies as ordered. Elastography with kpa 7.1 and hepatic echgenicity fatty liver vs cirrhosis, advised to have fibrosure testing for further evaluation which was also not completed   Weight up to 136 today from 117 lbs in [place].   Present:  Patient states he's feeling good. Denies abdominal pain, he states he is eating well, facility will not give him seconds. He states that he is having a BM every other day. He denies rectal bleeding or melena. He denies nausea or vomiting.   Social worker states that the physician at the facility did not feel colonoscopy was warranted as ordered previously as patient was not doing well and was considered for hospice but is doing much better than before. She also states that some of his medications have been changed and BP has improved which has also reduced the number of falls he is having.   Last Colonoscopy: 2011, internal hemorrhoids  Last Endoscopy:?  Recommendations:    Past Medical History:  Diagnosis Date   Anxiety    Atherosclerosis of aorta    Auditory hallucination    Cerebral aneurysm, nonruptured    COPD with emphysema    Drug abuse    Dysphagia, oropharyngeal phase    GAD (generalized anxiety disorder)    GERD (gastroesophageal reflux disease)    Hepatitis  C    Hypercholesterolemia    Hypertension    Middle cerebral artery aneurysm    Mild intellectual disabilities    Paranoia    Pneumonia 2017   Psoriasis    Psoriasis vulgaris    Repeated falls    Schizoaffective disorder, depressive type    Schizophrenia    diagnosed at age 9, treated by ACT team    Past Surgical History:  Procedure Laterality Date   CHEST TUBE INSERTION     COLONOSCOPY  Nov 2011   Dr. Samuella Cota: normal,  internal hemorrhoids   COLONOSCOPY  Sept 2009   small internal hemorrhoids, normal    ESOPHAGOGASTRODUODENOSCOPY  June 2010   Dr. Samuella Cota: normal   ESOPHAGOGASTRODUODENOSCOPY  Jan 2009   Dr. Allena Katz: linear esophagitis, mild stricture, diffuse gastritis and mild duodenitis, PATH: chronic gastritis, negative H.pylori    FOOT SURGERY     right    Current Outpatient Medications  Medication Sig Dispense Refill   albuterol (VENTOLIN HFA) 108 (90 Base) MCG/ACT inhaler Inhale 2 puffs into the lungs every 6 (six) hours as needed for wheezing or shortness of breath. 8 g 5   aspirin 81 MG chewable tablet Chew 81 mg by mouth daily.     benztropine (COGENTIN) 0.5 MG tablet Take 1 tablet (0.5 mg total) by mouth 2 (two) times daily. 60 tablet 0   bisacodyl (BISACODYL LAXATIVE) 10 MG suppository Place 10 mg rectally See admin instructions. Insert 1 suppository rectally every other night at bedtime.     cetirizine (ZYRTEC) 10 MG tablet Take 1 tablet (10 mg total) by mouth daily. (Patient taking differently: Take 10 mg by mouth daily as needed for allergies.) 30 tablet 0   chlorproMAZINE (THORAZINE) 25 MG tablet Take 1 tablet (25 mg total) by mouth daily. (Patient not taking: Reported on 01/05/2023) 30 tablet 0   finasteride (PROSCAR) 5 MG tablet Take 5 mg by mouth daily.     FLUoxetine (PROZAC) 20 MG capsule Take 1 capsule (20 mg total) by mouth daily. 30 capsule 0   Fluticasone-Umeclidin-Vilant (TRELEGY ELLIPTA) 100-62.5-25 MCG/ACT AEPB Inhale 1 puff into the lungs daily.     gabapentin (NEURONTIN) 100 MG capsule Take 100 mg by mouth 3 (three) times daily.     levETIRAcetam (KEPPRA) 500 MG tablet Take 1 tablet (500 mg total) by mouth 2 (two) times daily. 60 tablet 0   lovastatin (MEVACOR) 20 MG tablet Take 1 tablet (20 mg total) by mouth daily. (Patient taking differently: Take 20 mg by mouth every evening.) 30 tablet 0   midodrine (PROAMATINE) 10 MG tablet Take 1 tablet (10 mg total) by mouth 3 (three) times  daily with meals. 90 tablet 0   montelukast (SINGULAIR) 10 MG tablet Take 1 tablet (10 mg total) by mouth at bedtime. 30 tablet 0   Multiple Vitamin (MULTIVITAMIN WITH MINERALS) TABS tablet Take 1 tablet by mouth daily.     OLANZapine (ZYPREXA) 15 MG tablet Take 15 mg by mouth at bedtime.     OLANZapine (ZYPREXA) 20 MG tablet Take 1 tablet (20 mg total) by mouth at bedtime. (Patient not taking: Reported on 01/05/2023) 30 tablet 0   OLANZapine (ZYPREXA) 5 MG tablet Take 5 mg by mouth in the morning.     omeprazole (PRILOSEC) 20 MG capsule Take 20 mg by mouth daily.     senna-docusate (SENNA PLUS) 8.6-50 MG tablet Take 1 tablet by mouth 2 (two) times daily.     tamsulosin (FLOMAX) 0.4 MG CAPS capsule  Take 1 capsule (0.4 mg total) by mouth every evening. 30 capsule 0   traZODone (DESYREL) 50 MG tablet Take 1 tablet (50 mg total) by mouth at bedtime. 30 tablet 0   valbenazine (INGREZZA) 40 MG capsule Take 40 mg by mouth daily.     No current facility-administered medications for this visit.    Allergies as of 03/19/2023 - Review Complete 03/19/2023  Allergen Reaction Noted   Propoxyphene Rash 05/02/2022    Family History  Problem Relation Age of Onset   COPD Father    Colon cancer Neg Hx     Social History   Socioeconomic History   Marital status: Single    Spouse name: Not on file   Number of children: Not on file   Years of education: Not on file   Highest education level: Not on file  Occupational History   Not on file  Tobacco Use   Smoking status: Former    Packs/day: 1    Types: Cigarettes    Passive exposure: Past   Smokeless tobacco: Never  Vaping Use   Vaping Use: Never used  Substance and Sexual Activity   Alcohol use: No   Drug use: Not Currently    Comment: IV drug use in remote past   Sexual activity: Not on file  Other Topics Concern   Not on file  Social History Narrative   Not on file   Social Determinants of Health   Financial Resource Strain: Not on  file  Food Insecurity: Not on file  Transportation Needs: Not on file  Physical Activity: Not on file  Stress: Not on file  Social Connections: Not on file   Review of systems General: negative for malaise, night sweats, fever, chills, weight loss Neck: Negative for lumps, goiter, pain and significant neck swelling Resp: Negative for cough, wheezing, dyspnea at rest CV: Negative for chest pain, leg swelling, palpitations, orthopnea GI: denies melena, hematochezia, nausea, vomiting, diarrhea, constipation, dysphagia, odyonophagia, early satiety or unintentional weight loss.  MSK: Negative for joint pain or swelling, back pain, and muscle pain. Derm: Negative for itching or rash Psych: Denies depression, anxiety, memory loss, confusion. No homicidal or suicidal ideation.  Heme: Negative for prolonged bleeding, bruising easily, and swollen nodes. Endocrine: Negative for cold or heat intolerance, polyuria, polydipsia and goiter. Neuro: negative for tremor, gait imbalance, syncope and seizures. The remainder of the review of systems is noncontributory.  Physical Exam: BP 101/68 (BP Location: Left Arm, Patient Position: Sitting, Cuff Size: Normal)   Pulse 80   Temp (!) 97.4 F (36.3 C) (Oral)   Ht 6' (1.829 m)   Wt 136 lb 4.8 oz (61.8 kg)   BMI 18.49 kg/m  General:   Alert and oriented. No distress noted. Pleasant and cooperative.  Head:  Normocephalic and atraumatic. Eyes:  Conjuctiva clear without scleral icterus. Mouth:  Oral mucosa pink and moist. Good dentition. No lesions. Heart: Normal rate and rhythm, s1 and s2 heart sounds present.  Lungs: Clear lung sounds in all lobes. Respirations equal and unlabored. Abdomen:  +BS, soft, non-tender and non-distended. No rebound or guarding. No HSM or masses noted. Derm: No palmar erythema or jaundice Msk:  Symmetrical without gross deformities. Normal posture. Extremities:  Without edema. Neurologic:  Alert and  oriented x4 Psych:   Alert and cooperative. Normal mood and affect.  Invalid input(s): "6 MONTHS"   ASSESSMENT: Keeghan Bialy is a 67 y.o. male presenting today    PLAN:  Fibrosure testing  2. H&H, Iron studies  3. Colonoscopy for screening  4. Repeat US of liver July 2024  All questions were answered, patient verbalized understanding and is in agreement with plan as outlined above.    Follow Up: ***  Janasha Barkalow L. Jeanmarie Hubert, MSN, APRN, AGNP-C Adult-Gerontology Nurse Practitioner [facility]

## 2023-03-20 ENCOUNTER — Encounter (INDEPENDENT_AMBULATORY_CARE_PROVIDER_SITE_OTHER): Payer: Self-pay

## 2023-03-27 ENCOUNTER — Encounter: Payer: Self-pay | Admitting: Nurse Practitioner

## 2023-03-27 ENCOUNTER — Non-Acute Institutional Stay: Payer: 59 | Admitting: Nurse Practitioner

## 2023-03-27 VITALS — BP 107/66 | HR 97 | Temp 98.0°F | Resp 18 | Wt 133.3 lb

## 2023-03-27 DIAGNOSIS — J449 Chronic obstructive pulmonary disease, unspecified: Secondary | ICD-10-CM

## 2023-03-27 DIAGNOSIS — Z515 Encounter for palliative care: Secondary | ICD-10-CM

## 2023-03-27 DIAGNOSIS — R5381 Other malaise: Secondary | ICD-10-CM

## 2023-03-27 DIAGNOSIS — R0602 Shortness of breath: Secondary | ICD-10-CM

## 2023-03-27 DIAGNOSIS — R630 Anorexia: Secondary | ICD-10-CM

## 2023-03-27 NOTE — Progress Notes (Signed)
Therapist, nutritional Palliative Care Consult Note Telephone: [phone]  Fax: [phone]    Date of encounter: 03/27/23 12:08 PM PATIENT NAME: Ian Medina [address] [place] [address]   [phone] (home)  DOB: Nov 27, 1956 MRN: [id number] PRIMARY CARE PROVIDER:    [facility]  RESPONSIBLE PARTY:    Contact Information     Name Relation Home Work Mobile   nines,carrie Legal Guardian   [phone]     I met face to face with patient in facility. Palliative Care was asked to follow this patient by consultation request of  Lorelee Market, MD/AHCC to address advance care planning and complex medical decision making. This is the initial visit.                           ASSESSMENT AND PLAN / RECOMMENDATIONS:  Symptom Management/Plan: 1. Advance Care Planning;  DNR   2. Palliative care encounter; Palliative care encounter; Palliative medicine team will continue to support patient, patient's family, and medical team. Visit consisted of counseling and education dealing with the complex and emotionally intense issues of symptom management and palliative care in the setting of serious and potentially life-threatening illness 3 Debility secondary to decompensation, recent hospitalization, continues ongoing decline functionally. Continue to encourage restorative exercises; has completed therapy though difficulty with processing.    4. Anorexia weight gain; continue significant amount of weight. secondary to copd continue to monitor weights, appetite %, supplements, encourage Ian Endicott to eat, reviewed weights.  03/2022 weight 200 lbs 11/10/2023 weight 123 lbs 11/28/2022 weight 119.7 lbs 01/01/2023 weight 118.9 lbs 01/29/2023 weight 121.2 lbs 03/05/2023 weight 133.3 lbs 12.1 lbs weight gain/5 weeks 5. Sob secondary to copd, stable, continue to monitor O2 saturations, inhalation therapy, respiratory status.    Follow up  Palliative Care Visit: Palliative care will continue to follow for complex medical decision making, advance care planning, and clarification of goals. Return 2 to 8  weeks or prn. Continue to monitor, follow with PC with therapy, monitoring weights, appetite, overall decline, debility.    I spent 47 minutes providing this consultation. More than 50% of the time in this consultation was spent in counseling and care coordination. PPS: 40% Chief Complaint: Initial palliative consult for complex medical decision making, address goals, manage ongoing symptoms   HISTORY OF PRESENT ILLNESS:  Ian Medina is a 67 y.o. year old male  with multiple medical problems including schizoaffective disorder, mild intellectual disability, nicotine dependence, COPD, generalized anxiety disorder , hypertension, hepatitis C, group home resident with guardians through DSS. Ian Medina resides at [facility] L[facility]. Ian Medina was seen in ED from 12/11/2022 to 12/12/2022 for mechanical fall with workup significant for acute greater trochanter avulsion fracture, stabilized and f/u Ortho Out patient. Ian Medina returned to ED for deformity to right finger with workup revealed dislocation which was reduced, immobilized and sling with d/c back to [facility].  03/19/2023 seen by GI for abnormal ultrasound of liver, weight. Colonoscopy scheduled for 05/08/2023. Purpose of today PC f/u visit further discussion monitor trends of appetite, weights, monitor for functional, cognitive decline with chronic disease progression, assess any active symptoms, supportive role. At present Ian Medina is lying in bed, sleeping. Awoke to verbal cues. Ian Medina is interactive, asking same questions. ROS, discussed concerns of roommate, will relay to [facility]; talked about mobility, being oob. We talked about activities encouraged Ian Medina to go things he likes  to do including sleeping too much, encouraged sleep hygiene, no napping during the day. We talked about  appetite, foods he likes. Limited with cognitive impairment. Most PC visit supportive. Medications, poc, goc, provider and nursing notes reviewed. I called [name] DSS Guardian, clinical update and pc visit discussed. We talked about Ian Medina increase in weight which no longer appears eligible for hospice currently. Will continue to monitor, follow. PC f/u visit further discussion monitor trends of appetite, weights, monitor for functional, cognitive decline with chronic disease progression, assess any active symptoms, supportive role. Updated staff   History obtained from review of EMR, discussion with primary team, and interview with family, facility staff/caregiver and/or Ian. Whetsell.  I reviewed available labs, medications, imaging, studies and related documents from the EMR.  Records reviewed and summarized above.  Physical Exam: General: frail appearing, thin pleasant male ENMT: oral mucous membranes moist CV: S1S2, RRR Pulmonary: clear Musculoskeletal: +muscle wasting Neuro:  +generalized weakness Psych: non-anxious affect, A and Oriented to self  Thank you for the opportunity to participate in the care of Ian. Medina. Please call our office at [phone] if we can be of additional assistance.   Cayne Yom Prince Rome, NP

## 2023-04-09 ENCOUNTER — Telehealth (INDEPENDENT_AMBULATORY_CARE_PROVIDER_SITE_OTHER): Payer: Self-pay | Admitting: Gastroenterology

## 2023-04-09 NOTE — Telephone Encounter (Signed)
Pt guardian [name] contacted and made aware of pre op for 05/03/23 at 10:00am [facility].

## 2023-04-10 ENCOUNTER — Telehealth (INDEPENDENT_AMBULATORY_CARE_PROVIDER_SITE_OTHER): Payer: Self-pay | Admitting: Gastroenterology

## 2023-04-10 NOTE — Telephone Encounter (Signed)
Per Mindy hey if you can look at this. The date he is booked for, Dr. Levon Hedger is already full. he will have to be moved. No room for him on the schedule for 6/11. Also just a reminder lunches are no longer than 30 minutes for endo. so any days showing 45 minutes will need to be fixed to show how endo has. Higher ups will start looking at schedules so we can get 3rd room opened. thanks for all you do :) Orders were put in for [facility] but Endo somehow put them on [place] schedule.

## 2023-04-11 NOTE — Telephone Encounter (Signed)
Mindy spoke with Shawna Orleans and was able to add patient to end of day.

## 2023-04-17 ENCOUNTER — Encounter: Payer: Self-pay | Admitting: Nurse Practitioner

## 2023-04-17 ENCOUNTER — Non-Acute Institutional Stay: Payer: 59 | Admitting: Nurse Practitioner

## 2023-04-17 VITALS — BP 94/63 | HR 68 | Temp 97.8°F | Resp 18 | Wt 140.1 lb

## 2023-04-17 DIAGNOSIS — R0602 Shortness of breath: Secondary | ICD-10-CM

## 2023-04-17 DIAGNOSIS — Z515 Encounter for palliative care: Secondary | ICD-10-CM

## 2023-04-17 DIAGNOSIS — J449 Chronic obstructive pulmonary disease, unspecified: Secondary | ICD-10-CM

## 2023-04-17 DIAGNOSIS — R5381 Other malaise: Secondary | ICD-10-CM

## 2023-04-17 NOTE — Progress Notes (Addendum)
Therapist, nutritional Palliative Care Consult Note Telephone: [phone]  Fax: [phone]    Date of encounter: 04/17/23 2:14 PM PATIENT NAME: Ian Medina [facility] [address] [place] [address]   [phone] (home)  DOB: Jun 17, 1956 MRN: [id number] PRIMARY CARE PROVIDER:    [facility]  RESPONSIBLE PARTY:    Contact Information     Name Relation Home Work Mobile   nines,carrie Legal [phone]     I met face to face with patient in facility. Palliative Care was asked to follow this patient by consultation request of  Lorelee Market, MD/AHCC to address advance care planning and complex medical decision making. This is the initial visit.                           ASSESSMENT AND PLAN / RECOMMENDATIONS:  Symptom Management/Plan: 1. Advance Care Planning;  DNR   2. Palliative care encounter; Palliative care encounter; Palliative medicine team will continue to support patient, patient's family, and medical team. Visit consisted of counseling and education dealing with the complex and emotionally intense issues of symptom management and palliative care in the setting of serious and potentially life-threatening illness 3 Debility secondary to decompensation, recent hospitalization, continues ongoing decline functionally. Continue to encourage restorative exercises; has completed therapy though difficulty with processing.    4. Anorexia weight gain; continue significant amount of weight. secondary to copd continue to monitor weights, appetite %, supplements, encourage Ian Derderian to eat, reviewed weights.  03/2022 weight 200 lbs 11/10/2023 weight 123 lbs 11/28/2022 weight 119.7 lbs 01/01/2023 weight 118.9 lbs 01/29/2023 weight 121.2 lbs 03/05/2023 weight 133.3 lbs 03/30/2023 weight 140.1 lbs 5. Sob secondary to copd, stable, continue to monitor O2 saturations, inhalation therapy, respiratory status.    Follow up  Palliative Care Visit: Palliative care will continue to follow for complex medical decision making, advance care planning, and clarification of goals. Return 2 to 8  weeks or prn. Continue to monitor, follow with PC with therapy, monitoring weights, appetite, overall decline, debility.    I spent 45 minutes providing this consultation. More than 50% of the time in this consultation was spent in counseling and care coordination. PPS: 40% Chief Complaint: Initial palliative consult for complex medical decision making, address goals, manage ongoing symptoms   HISTORY OF PRESENT ILLNESS:  Ian Medina is a 67 y.o. year old male  with multiple medical problems including schizoaffective disorder, mild intellectual disability, nicotine dependence, COPD, generalized anxiety disorder , hypertension, hepatitis C, group home resident with guardians through DSS. Ian Medina resides at [facility]. Ian Medina was seen in ED from 12/11/2022 to 12/12/2022 for mechanical fall with workup significant for acute greater trochanter avulsion fracture, stabilized and f/u Ortho Out patient. Ian Ohmann returned to ED for deformity to right finger with workup revealed dislocation which was reduced, immobilized and sling with d/c back to [facility].  03/19/2023 seen by GI for abnormal ultrasound of liver, weight. Colonoscopy scheduled for 05/08/2023. Purpose of today PC f/u visit further discussion monitor trends of appetite, weights, monitor for functional, cognitive decline with chronic disease progression, assess any active symptoms, supportive role. At present Ian Medina is lying in bed, got up to w/c. Ian Rocchio was engaging, limited with his conversations, repetitively asked same questions. We talked about ros, appetite, focused on food, sleeping, daily routine, activities. Encouraged Ian Medina to be oob during the day, not napping. We  talked about Ian Medina's room-mate and his concerns of not being able to rest. Support provided. Limited  with cognitive impairment. With weight gain, Ian Medina has stabilized, doing better overall. I called Ian Medina DSS Guardian, clinical update discussed, talked about pc visit, no new changes to poc. Reviewed medications, provider notes, goc, poc. PC f/u visit further discussion monitor trends of appetite, weights, monitor for functional, cognitive decline with chronic disease progression, assess any active symptoms, supportive role. Updated staff   History obtained from review of EMR, discussion with primary team, and interview with family, facility staff/caregiver and/or Ian. Klemmer.  I reviewed available labs, medications, imaging, studies and related documents from the EMR.  Records reviewed and summarized above.  Physical Exam: General: frail appearing, thin pleasant male ENMT: oral mucous membranes moist CV: S1S2, RRR Pulmonary: clear Musculoskeletal: +muscle wasting Neuro:  +generalized weakness Psych: non-anxious affect, A and Oriented to self Thank you for the opportunity to participate in the care of Ian. Medina. Please call our office at [phone] if we can be of additional assistance.   Nedra Mcinnis Prince Rome, NP

## 2023-04-27 ENCOUNTER — Emergency Department
Admission: EM | Admit: 2023-04-27 | Discharge: 2023-04-27 | Disposition: A | Discharge status: Home / Self Care | Payer: 59 | Attending: Emergency Medicine | Admitting: Emergency Medicine

## 2023-04-27 ENCOUNTER — Emergency Department: Payer: 59

## 2023-04-27 ENCOUNTER — Other Ambulatory Visit: Payer: Self-pay

## 2023-04-27 DIAGNOSIS — W1830XA Fall on same level, unspecified, initial encounter: Secondary | ICD-10-CM | POA: Insufficient documentation

## 2023-04-27 DIAGNOSIS — J449 Chronic obstructive pulmonary disease, unspecified: Secondary | ICD-10-CM | POA: Insufficient documentation

## 2023-04-27 DIAGNOSIS — W19XXXA Unspecified fall, initial encounter: Secondary | ICD-10-CM

## 2023-04-27 DIAGNOSIS — Z23 Encounter for immunization: Secondary | ICD-10-CM | POA: Insufficient documentation

## 2023-04-27 DIAGNOSIS — S0181XA Laceration without foreign body of other part of head, initial encounter: Secondary | ICD-10-CM | POA: Diagnosis not present

## 2023-04-27 LAB — CBC WITH DIFFERENTIAL/PLATELET
Abs Immature Granulocytes: 0.02 10*3/uL (ref 0.00–0.07)
Basophils Absolute: 0.1 10*3/uL (ref 0.0–0.1)
Basophils Relative: 1 %
Eosinophils Absolute: 0.1 10*3/uL (ref 0.0–0.5)
Eosinophils Relative: 1 %
HCT: 39.9 % (ref 39.0–52.0)
Hemoglobin: 12.4 g/dL — ABNORMAL LOW (ref 13.0–17.0)
Immature Granulocytes: 0 %
Lymphocytes Relative: 48 %
Lymphs Abs: 4.3 10*3/uL — ABNORMAL HIGH (ref 0.7–4.0)
MCH: 28.6 pg (ref 26.0–34.0)
MCHC: 31.1 g/dL (ref 30.0–36.0)
MCV: 92.1 fL (ref 80.0–100.0)
Monocytes Absolute: 0.4 10*3/uL (ref 0.1–1.0)
Monocytes Relative: 5 %
Neutro Abs: 3.9 10*3/uL (ref 1.7–7.7)
Neutrophils Relative %: 45 %
Platelets: 306 10*3/uL (ref 150–400)
RBC: 4.33 MIL/uL (ref 4.22–5.81)
RDW: 13.9 % (ref 11.5–15.5)
WBC: 8.7 10*3/uL (ref 4.0–10.5)
nRBC: 0 % (ref 0.0–0.2)

## 2023-04-27 LAB — BASIC METABOLIC PANEL
Anion gap: 6 (ref 5–15)
BUN: 15 mg/dL (ref 8–23)
CO2: 29 mmol/L (ref 22–32)
Calcium: 9 mg/dL (ref 8.9–10.3)
Chloride: 103 mmol/L (ref 98–111)
Creatinine, Ser: 0.85 mg/dL (ref 0.61–1.24)
GFR, Estimated: >60 mL/min (ref >60)
Glucose, Bld: 108 mg/dL — ABNORMAL HIGH (ref 70–99)
Potassium: 4.1 mmol/L (ref 3.5–5.1)
Sodium: 138 mmol/L (ref 135–145)

## 2023-04-27 LAB — TROPONIN I (HIGH SENSITIVITY): Troponin I (High Sensitivity): 3 ng/L (ref <18)

## 2023-04-27 MED ORDER — SODIUM CHLORIDE 0.9 % IV BOLUS
1000.0000 mL | Freq: Once | INTRAVENOUS | Status: AC
  Administered 2023-04-27: 1000 mL via INTRAVENOUS

## 2023-04-27 MED ORDER — MUPIROCIN 2 % EX OINT: 1.0000 | TOPICAL_OINTMENT | Freq: Two times a day (BID) | CUTANEOUS | 0 refills | Status: AC

## 2023-04-27 MED ORDER — TETANUS-DIPHTH-ACELL PERTUSSIS 5-2.5-18.5 LF-MCG/0.5 IM SUSY
0.5000 mL | PREFILLED_SYRINGE | Freq: Once | INTRAMUSCULAR | Status: AC
  Administered 2023-04-27: 0.5 mL via INTRAMUSCULAR
  Filled 2023-04-27: qty 0.5

## 2023-04-27 MED ORDER — LIDOCAINE-EPINEPHRINE 2 %-1:100000 IJ SOLN
20.0000 mL | Freq: Once | INTRAMUSCULAR | Status: AC
  Administered 2023-04-27: 20 mL via INTRADERMAL
  Filled 2023-04-27: qty 1

## 2023-04-27 NOTE — Discharge Instructions (Signed)
Your suture will absorb  Do not swim  Apply the antibiotic ointment twice a day for 7-10 days until healed

## 2023-04-27 NOTE — ED Notes (Signed)
Called [facility] for transport back to facility  

## 2023-04-27 NOTE — ED Notes (Signed)
Patient given sprite.

## 2023-04-27 NOTE — ED Provider Notes (Signed)
[facility] Provider Note    Event Date/Time   First MD Initiated Contact with Patient 04/27/23 [phone]     (approximate)   History   Fall   HPI  Ian Medina is a 67 y.o. male  here with fall. Pt reports that he stood up this morning to get ready and felt lightheaded, causing him to fall. Reports that he doesn't like the food where he lives so he felt weak and this happens from time to time. Hit his face/head. Reports immediate onset bleeding/wounds to eyebrows. No neck pain. Denies recent fevers, chills. No major pain currently. No chest pain. No hip pain.       Physical Exam   Triage Vital Signs: ED Triage Vitals  Enc Vitals Group     BP 04/27/23 0710 94/73     Pulse Rate 04/27/23 0710 69     Resp 04/27/23 0710 18     Temp 04/27/23 0712 98 F (36.7 C)     Temp Source 04/27/23 0712 Oral     SpO2 04/27/23 0710 99 %     Weight 04/27/23 0710 139 lb 15.9 oz (63.5 kg)     Height 04/27/23 0710 6' (1.829 m)     Head Circumference --      Peak Flow --      Pain Score 04/27/23 0710 0     Pain Loc --      Pain Edu? --      Excl. in GC? --     Most recent vital signs: Vitals:   04/27/23 0710 04/27/23 0712  BP: 94/73   Pulse: 69   Resp: 18   Temp:  98 F (36.7 C)  SpO2: 99%      General: Awake, no distress.  CV:  Good peripheral perfusion. RRR. Resp:  Normal work of breathing. Lungs clear. Chest wall non tender. Abd:  No distention. No tenderness. No rebound or guarding. Other:  Approx 1 cm linear laceration to right brow. Approx 1 cm abrasion to left brow. No major deformity/bruising/ecchymoses.   ED Results / Procedures / Treatments   Labs (all labs ordered are listed, but only abnormal results are displayed) Labs Reviewed  CBC WITH DIFFERENTIAL/PLATELET - Abnormal; Notable for the following components:      Result Value   Hemoglobin 12.4 (*)    Lymphs Abs 4.3 (*)    All other components within normal limits  BASIC METABOLIC PANEL  - Abnormal; Notable for the following components:   Glucose, Bld 108 (*)    All other components within normal limits  TROPONIN I (HIGH SENSITIVITY)     EKG Normals inus rhythm, VR 67. PR 157, QRS 114. QTc 423. No acute St elevations or depressions. No ischemia or infarct.   RADIOLOGY CT head/C-spine: No acute abnormality CT face: No fracture   I also independently reviewed and agree with radiologist interpretations.   PROCEDURES:  Critical Care performed: No  ..Laceration Repair  Date/Time: 04/27/2023 7:44 AM  Performed by: Shaune Pollack, MD Authorized by: Shaune Pollack, MD   Consent:    Consent obtained:  Verbal   Consent given by:  Patient   Risks, benefits, and alternatives were discussed: yes     Risks discussed:  Infection, need for additional repair, nerve damage, poor wound healing, poor cosmetic result, pain, vascular damage, retained foreign body and tendon damage   Alternatives discussed:  No treatment Universal protocol:    Patient identity confirmed:  Verbally with patient Anesthesia:  Anesthesia method:  Local infiltration   Local anesthetic:  Lidocaine 2% WITH epi Laceration details:    Location:  Face   Face location:  R eyebrow   Length (cm):  1 Pre-procedure details:    Preparation:  Patient was prepped and draped in usual sterile fashion Exploration:    Wound extent: no signs of injury, no tendon damage and no underlying fracture   Treatment:    Area cleansed with:  Povidone-iodine   Amount of cleaning:  Extensive   Irrigation solution:  Sterile saline   Irrigation method:  Pressure wash Skin repair:    Repair method:  Sutures   Suture size:  5-0   Wound skin closure material used: Vicryl Rapide.   Suture technique:  Simple interrupted   Number of sutures:  5 Approximation:    Approximation:  Close Repair type:    Repair type:  Simple Post-procedure details:    Dressing:  Antibiotic ointment   Procedure completion:  Tolerated  well, no immediate complications .Marland KitchenLaceration Repair  Date/Time: 04/27/2023 10:41 AM  Performed by: Shaune Pollack, MD Authorized by: Shaune Pollack, MD   Consent:    Consent obtained:  Verbal   Consent given by:  Patient   Risks, benefits, and alternatives were discussed: yes     Risks discussed:  Infection, need for additional repair, nerve damage, poor wound healing, poor cosmetic result, pain, retained foreign body, tendon damage and vascular damage   Alternatives discussed:  No treatment Laceration details:    Length (cm):  1 Pre-procedure details:    Preparation:  Patient was prepped and draped in usual sterile fashion Exploration:    Wound extent: areolar tissue not violated, fascia not violated and no signs of injury   Treatment:    Area cleansed with:  Povidone-iodine   Amount of cleaning:  Extensive   Irrigation solution:  Sterile saline   Irrigation method:  Pressure wash   Debridement:  None   Undermining:  None Skin repair:    Repair method:  Sutures   Suture size:  5-0   Wound skin closure material used: Vicryl Rapide.   Suture technique:  Simple interrupted   Number of sutures:  4 Approximation:    Approximation:  Close Repair type:    Repair type:  Simple Post-procedure details:    Dressing:  Open (no dressing)   Procedure completion:  Tolerated     MEDICATIONS ORDERED IN ED: Medications  lidocaine-EPINEPHrine (XYLOCAINE W/EPI) 2 %-1:100000 (with pres) injection 20 mL (20 mLs Intradermal Given by Other 04/27/23 0858)  sodium chloride 0.9 % bolus 1,000 mL (0 mLs Intravenous Stopped 04/27/23 1009)  Tdap (BOOSTRIX) injection 0.5 mL (0.5 mLs Intramuscular Given 04/27/23 0856)     IMPRESSION / MDM / ASSESSMENT AND PLAN / ED COURSE  I reviewed the triage vital signs and the nursing notes.                              Differential diagnosis includes, but is not limited to, mechanical fall, fall from orthostasis, dehydration, anemia, electrolyte  abnormality, unlikely arrhythmia or ACS.  Patient's presentation is most consistent with acute presentation with potential threat to life or bodily function.  The patient is on the cardiac monitor to evaluate for evidence of arrhythmia and/or significant heart rate changes  67 year old male with past medical history of schizophrenia, COPD, here with fall and bilateral eyebrow lacerations.  Suspect mild orthostasis in the setting of  poor p.o. intake.  Patient is otherwise hemodynamically stable.  He has no focal neurological deficits.  CBC with no leukocytosis.  BMP unremarkable.  Was given fluids and is tolerating p.o. without difficulty.  CT of the head, C-spine, and face reviewed by me, and are unremarkable for any acute traumatic abnormality.  Tetanus is up-to-date.  Lacerations repaired as above.  Discharge with encouraged hydration and return precautions.    FINAL CLINICAL IMPRESSION(S) / ED DIAGNOSES   Final diagnoses:  Fall, initial encounter  Facial laceration, initial encounter     Rx / DC Orders   ED Discharge Orders          Ordered    mupirocin ointment (BACTROBAN) 2 %  2 times daily        04/27/23 1002             Note:  This document was prepared using Dragon voice recognition software and may include unintentional dictation errors.   Shaune Pollack, MD 04/27/23 1043

## 2023-04-27 NOTE — ED Triage Notes (Signed)
Pt here from [facility] after falling this morning after tripping over his shorts drawstring. Pt denies LOC but did hit his head, small lacs to both eyebrows with slight bleeding that is controlled. Pt is not on a blood thinner but takes aspirin.   99/65

## 2023-05-03 ENCOUNTER — Encounter (HOSPITAL_COMMUNITY): Payer: Self-pay

## 2023-05-03 ENCOUNTER — Other Ambulatory Visit: Payer: Self-pay

## 2023-05-03 ENCOUNTER — Encounter (HOSPITAL_COMMUNITY)
Admission: RE | Admit: 2023-05-03 | Discharge: 2023-05-03 | Disposition: A | Discharge status: Home / Self Care | Payer: 59 | Source: Ambulatory Visit | Attending: Gastroenterology | Admitting: Gastroenterology

## 2023-05-03 NOTE — Patient Instructions (Signed)
    Ian Medina  05/03/2023     @PREFPERIOPPHARMACY @   Your procedure is scheduled on  05/08/2023.    Report to [facility] at  1245 P.M. Come in at the [facility] and register.   Call this number if you have problems the morning of surgery:  [phone]  If you experience any cold or flu symptoms such as cough, fever, chills, shortness of breath, etc. between now and your scheduled surgery, please notify us at the above number.   Remember:  Follow the enclosed diet and prep instructions. Dr Wilburt Finlay office should have called in the prep for the patient already.     Take these medicines the morning of surgery with A SIP OF WATER  cogentin, proscar, prozac, gabapentin, keppra, zyprexa, omeprazole.        Once patient has had the above morning meds, please document this on his MAR and make a copy to bring with him so we will know if he took these medications and what time.    Do not wear jewelry, make-up or nail polish, including gel polish,  artificial nails, or any other type of covering on natural nails (fingers and  toes).   Do not wear lotions, powders, or perfumes, or deodorant.   Do not shave 48 hours prior to surgery.  Men may shave face and neck.  Do not bring valuables to the hospital.   [facility] is not responsible for any belongings or valuables.   Contacts, dentures or bridgework may not be worn into surgery.  Leave your suitcase in the car.  After surgery it may be brought to your room.  For patients admitted to the hospital, discharge time will be determined by your treatment team.  Patients discharged the day of surgery will not be allowed to drive home.    Special instructions:  DO NOT smoke tobacco or vape for 24 hours before your procedure.  Please read over the following fact sheets that you were given. Anesthesia Post-op Instructions and Care and Recovery After Surgery

## 2023-05-03 NOTE — Pre-Procedure Instructions (Signed)
Patient is a resident at [facility] and has a SW as his legal guardian. I spoke with [name], SW @ [phone] and she will be coming to meet patient the day of his procedure and will sign consent at that time. Her guardian documents are in EPIC. I spoke with Marylene Land, nurse at [facility] and went over preop instructions with her, She did not see a copy of prep instructions in patients chart. I faxed prep instructions and pre-op instructions to her at [phone].

## 2023-05-07 ENCOUNTER — Telehealth (INDEPENDENT_AMBULATORY_CARE_PROVIDER_SITE_OTHER): Payer: Self-pay | Admitting: Gastroenterology

## 2023-05-07 NOTE — Telephone Encounter (Signed)
Ian Medina from [facility] left voicemail stating that they did not receive prep. Pt has TCS 05/08/23.   Contacted [facility] back and spoke with Marylene Land. Marylene Land states she has Suprep on hand and would like to use that instead of trying to get other prep from pharmacy. Kathlee Nations to give one bottle tonight at 8 pm and the 2nd bottle in the morning at 8:45am (6 hours prior to procedure).

## 2023-05-08 ENCOUNTER — Ambulatory Visit (HOSPITAL_COMMUNITY)
Admission: RE | Admit: 2023-05-08 | Discharge: 2023-05-08 | Disposition: A | Discharge status: Skilled Nursing Facility | Payer: 59 | Attending: Gastroenterology | Admitting: Gastroenterology

## 2023-05-08 ENCOUNTER — Ambulatory Visit (HOSPITAL_BASED_OUTPATIENT_CLINIC_OR_DEPARTMENT_OTHER): Payer: 59 | Admitting: Certified Registered Nurse Anesthetist

## 2023-05-08 ENCOUNTER — Ambulatory Visit (HOSPITAL_COMMUNITY): Payer: 59 | Admitting: Certified Registered Nurse Anesthetist

## 2023-05-08 ENCOUNTER — Encounter (INDEPENDENT_AMBULATORY_CARE_PROVIDER_SITE_OTHER): Payer: Self-pay | Admitting: *Deleted

## 2023-05-08 ENCOUNTER — Encounter (HOSPITAL_COMMUNITY): Payer: Self-pay | Admitting: Gastroenterology

## 2023-05-08 ENCOUNTER — Encounter (HOSPITAL_COMMUNITY)
Admission: RE | Disposition: A | Discharge status: Skilled Nursing Facility | Payer: Self-pay | Source: Home / Self Care | Attending: Gastroenterology

## 2023-05-08 DIAGNOSIS — Z8619 Personal history of other infectious and parasitic diseases: Secondary | ICD-10-CM | POA: Diagnosis not present

## 2023-05-08 DIAGNOSIS — F70 Mild intellectual disabilities: Secondary | ICD-10-CM | POA: Diagnosis not present

## 2023-05-08 DIAGNOSIS — F251 Schizoaffective disorder, depressive type: Secondary | ICD-10-CM | POA: Insufficient documentation

## 2023-05-08 DIAGNOSIS — D124 Benign neoplasm of descending colon: Secondary | ICD-10-CM

## 2023-05-08 DIAGNOSIS — Z1211 Encounter for screening for malignant neoplasm of colon: Secondary | ICD-10-CM

## 2023-05-08 DIAGNOSIS — F411 Generalized anxiety disorder: Secondary | ICD-10-CM | POA: Diagnosis not present

## 2023-05-08 DIAGNOSIS — Z79899 Other long term (current) drug therapy: Secondary | ICD-10-CM | POA: Diagnosis not present

## 2023-05-08 DIAGNOSIS — I10 Essential (primary) hypertension: Secondary | ICD-10-CM | POA: Insufficient documentation

## 2023-05-08 DIAGNOSIS — E7800 Pure hypercholesterolemia, unspecified: Secondary | ICD-10-CM | POA: Insufficient documentation

## 2023-05-08 DIAGNOSIS — K5730 Diverticulosis of large intestine without perforation or abscess without bleeding: Secondary | ICD-10-CM | POA: Insufficient documentation

## 2023-05-08 DIAGNOSIS — K635 Polyp of colon: Secondary | ICD-10-CM | POA: Diagnosis not present

## 2023-05-08 DIAGNOSIS — K219 Gastro-esophageal reflux disease without esophagitis: Secondary | ICD-10-CM | POA: Insufficient documentation

## 2023-05-08 DIAGNOSIS — J439 Emphysema, unspecified: Secondary | ICD-10-CM | POA: Insufficient documentation

## 2023-05-08 DIAGNOSIS — Z87891 Personal history of nicotine dependence: Secondary | ICD-10-CM | POA: Insufficient documentation

## 2023-05-08 DIAGNOSIS — J449 Chronic obstructive pulmonary disease, unspecified: Secondary | ICD-10-CM

## 2023-05-08 HISTORY — PX: POLYPECTOMY: SHX5525

## 2023-05-08 HISTORY — PX: COLONOSCOPY WITH PROPOFOL: SHX5780

## 2023-05-08 LAB — HM COLONOSCOPY

## 2023-05-08 SURGERY — COLONOSCOPY WITH PROPOFOL
Anesthesia: General

## 2023-05-08 MED ORDER — PROPOFOL 500 MG/50ML IV EMUL
INTRAVENOUS | Status: DC | PRN
  Administered 2023-05-08: 125 ug/kg/min via INTRAVENOUS

## 2023-05-08 MED ORDER — EPHEDRINE SULFATE (PRESSORS) 50 MG/ML IJ SOLN
INTRAMUSCULAR | Status: DC | PRN
  Administered 2023-05-08: 10 mg via INTRAVENOUS
  Administered 2023-05-08 (×3): 5 mg via INTRAVENOUS

## 2023-05-08 MED ORDER — PHENYLEPHRINE 80 MCG/ML (10ML) SYRINGE FOR IV PUSH (FOR BLOOD PRESSURE SUPPORT)
PREFILLED_SYRINGE | INTRAVENOUS | Status: DC | PRN
  Administered 2023-05-08 (×3): 80 ug via INTRAVENOUS
  Administered 2023-05-08 (×3): 160 ug via INTRAVENOUS

## 2023-05-08 MED ORDER — PROPOFOL 10 MG/ML IV BOLUS
INTRAVENOUS | Status: DC | PRN
  Administered 2023-05-08: 60 mg via INTRAVENOUS

## 2023-05-08 MED ORDER — LACTATED RINGERS IV SOLN: INTRAVENOUS | Status: DC

## 2023-05-08 NOTE — Transfer of Care (Signed)
Immediate Anesthesia Transfer of Care Note  Patient: Ian Medina  Procedure(s) Performed: COLONOSCOPY WITH PROPOFOL POLYPECTOMY  Patient Location: Short Stay  Anesthesia Type:General  Level of Consciousness: drowsy  Airway & Oxygen Therapy: Patient Spontanous Breathing  Post-op Assessment: Report given to RN and Post -op Vital signs reviewed and stable  Post vital signs: Reviewed and stable  Last Vitals:  Vitals Value Taken Time  BP 92/57 05/08/23 1157  Temp 36.6 C 05/08/23 1157  Pulse 65 05/08/23 1157  Resp 15 05/08/23 1157  SpO2 100 % 05/08/23 1157    Last Pain:  Vitals:   05/08/23 1157  TempSrc: Oral  PainSc: 0-No pain         Complications: No notable events documented.

## 2023-05-08 NOTE — Progress Notes (Signed)
Patient phase 2 recovery care complete at 1225.  Patient is waiting on transportation to take him back to the facility.  His social worker [name] is sitting with him.  Patient is in his wheelchair at the discharge exit area awaiting transportation.

## 2023-05-08 NOTE — Discharge Instructions (Signed)
You are being discharged to home.  Resume your previous diet.  Your physician has recommended a repeat colonoscopy in 10 years for screening purposes.  

## 2023-05-08 NOTE — Op Note (Signed)
[facility] Patient Name: Ian Medina Procedure Date: 05/08/2023 11:12 AM MRN: [id number] Date of Birth: 1956-07-16 Attending MD: Katrinka Blazing , , [id number] CSN: [id number] Age: 67 Admit Type: Outpatient Procedure:                Colonoscopy Indications:              Screening for colorectal malignant neoplasm Providers:                Katrinka Blazing, Angelica Ran, Zena Amos Referring MD:              Medicines:                Monitored Anesthesia Care Complications:            No immediate complications. Estimated Blood Loss:     Estimated blood loss: none. Procedure:                Pre-Anesthesia Assessment:                           - Prior to the procedure, a History and Physical                            was performed, and patient medications, allergies                            and sensitivities were reviewed. The patient's                            tolerance of previous anesthesia was reviewed.                           - The risks and benefits of the procedure and the                            sedation options and risks were discussed with the                            patient. All questions were answered and informed                            consent was obtained.                           - ASA Grade Assessment: II - A patient with mild                            systemic disease.                           After obtaining informed consent, the colonoscope                            was passed under direct vision. Throughout the                            procedure, the patient's blood pressure,  pulse, and                            oxygen saturations were monitored continuously. The                            [id number]-HQ190L ([id number]) scope was introduced through                            the anus and advanced to the the cecum, identified                            by appendiceal orifice and ileocecal valve. The                            colonoscopy was  performed without difficulty. The                            patient tolerated the procedure well. The quality                            of the bowel preparation was adequate to identify                            polyps greater than 5 mm in size. Scope In: 11:30:44 AM Scope Out: 11:52:04 AM Scope Withdrawal Time: 0 hours 13 minutes 21 seconds  Total Procedure Duration: 0 hours 21 minutes 20 seconds  Findings:      The perianal and digital rectal examinations were normal.      A few medium-mouthed diverticula were found in the ascending colon.      A 6 mm polyp was found in the descending colon. The polyp was sessile.       The polyp was removed with a cold snare. Resection was complete, but the       polyp tissue was not retrieved.      The retroflexed view of the distal rectum and anal verge was normal and       showed no anal or rectal abnormalities. Impression:               - Diverticulosis in the ascending colon.                           - One 6 mm polyp in the descending colon, removed                            with a cold snare. Complete resection. Polyp tissue                            not retrieved.                           - The distal rectum and anal verge are normal on                            retroflexion view. Moderate Sedation:  Per Anesthesia Care Recommendation:           - Discharge patient to home (ambulatory).                           - Resume previous diet.                           - Repeat colonoscopy in 10 years for screening                            purposes. Procedure Code(s):        --- Professional ---                           [id number], Colonoscopy, flexible; with removal of                            tumor(s), polyp(s), or other lesion(s) by snare                            technique Diagnosis Code(s):        --- Professional ---                           Z12.11, Encounter for screening for malignant                            neoplasm of  colon                           D12.4, Benign neoplasm of descending colon                           K57.30, Diverticulosis of large intestine without                            perforation or abscess without bleeding CPT copyright 2022 American Medical Association. All rights reserved. The codes documented in this report are preliminary and upon coder review may  be revised to meet current compliance requirements. Katrinka Blazing, MD Katrinka Blazing,  05/08/2023 11:59:59 AM This report has been signed electronically. Number of Addenda: 0

## 2023-05-08 NOTE — Anesthesia Preprocedure Evaluation (Addendum)
Anesthesia Evaluation  Patient identified by MRN, date of birth, ID band Patient awake    Reviewed: Allergy & Precautions, H&P , NPO status , Patient's Chart, lab work & pertinent test results  Airway Mallampati: II  TM Distance: >3 FB Neck ROM: Full    Dental  (+) Edentulous Upper, Edentulous Lower   Pulmonary pneumonia, COPD, former smoker   Pulmonary exam normal breath sounds clear to auscultation       Cardiovascular Exercise Tolerance: Poor METS (unsteady gait, repeated falls): hypertension, Pt. on medications Normal cardiovascular exam Rhythm:Regular Rate:Normal     Neuro/Psych  PSYCHIATRIC DISORDERS Anxiety Depression Bipolar Disorder Schizophrenia  IMPRESSION: 1. Curvilinear diffusion restriction and susceptibility artifact overlying the right parietal lobe is indeterminate but could potentially reflect a small thrombosed cortical vein. 2. Otherwise, no evidence of acute intracranial pathology. 3. 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     Electronically Signed   By: Lesia Hausen M.D.   On: 09/26/2022 14:59     Neuromuscular disease (Cerebral aneurysm)    GI/Hepatic ,GERD  Medicated and Controlled,,(+)     substance abuse  , Hepatitis -, C  Endo/Other  negative endocrine ROS    Renal/GU negative Renal ROS  negative genitourinary   Musculoskeletal negative musculoskeletal ROS (+)    Abdominal   Peds negative pediatric ROS (+)  Hematology  (+) Blood dyscrasia, anemia   Anesthesia Other Findings Repeated falls  Reproductive/Obstetrics negative OB ROS                             Anesthesia Physical Anesthesia Plan  ASA: 3  Anesthesia Plan: General   Post-op Pain Management: Minimal or no pain anticipated   Induction: Intravenous  PONV Risk Score and Plan: 1 and Propofol infusion  Airway  Management Planned: Nasal Cannula and Natural Airway  Additional Equipment:   Intra-op Plan:   Post-operative Plan:   Informed Consent: I have reviewed the patients History and Physical, chart, labs and discussed the procedure including the risks, benefits and alternatives for the proposed anesthesia with the patient or authorized representative who has indicated his/her understanding and acceptance.     Dental advisory given  Plan Discussed with: CRNA and Surgeon  Anesthesia Plan Comments:        Anesthesia Quick Evaluation

## 2023-05-08 NOTE — Anesthesia Postprocedure Evaluation (Signed)
Anesthesia Post Note  Patient: Ian Medina  Procedure(s) Performed: COLONOSCOPY WITH PROPOFOL POLYPECTOMY  Patient location during evaluation: Phase II Anesthesia Type: General Level of consciousness: awake and alert and oriented Pain management: pain level controlled Vital Signs Assessment: post-procedure vital signs reviewed and stable Respiratory status: spontaneous breathing, nonlabored ventilation and respiratory function stable Cardiovascular status: blood pressure returned to baseline and stable Postop Assessment: no apparent nausea or vomiting Anesthetic complications: no  No notable events documented.   Last Vitals:  Vitals:   05/08/23 1157  BP: (!) 92/57  Pulse: 65  Resp: 15  Temp: 36.6 C  SpO2: 100%    Last Pain:  Vitals:   05/08/23 1242  TempSrc:   PainSc: 0-No pain                 Ian Medina

## 2023-05-08 NOTE — H&P (Signed)
Ian Medina is an 67 y.o. male.   Chief Complaint: screening colonoscopy HPI: Ian Medina is a 67 y.o. male with past medical history of Hepatitis C successfully treated in 2018 with Harvoni. Genotype 1A,  schizoaffective disorder, mild intellectual disability, nicotine dependence, COPD, generalized anxiety disorder , hypertension.  Who comes to the hospital for screening colonoscopy.  The patient denies having any nausea, vomiting, fever, chills, hematochezia, melena, hematemesis, abdominal distention, abdominal pain, diarrhea, jaundice, pruritus or weight loss.  No family history of colon cancer.  Past Medical History:  Diagnosis Date   Anxiety    Atherosclerosis of aorta (HCC)    Auditory hallucination    Cerebral aneurysm, nonruptured    COPD with emphysema (HCC)    Drug abuse (HCC)    Dysphagia, oropharyngeal phase    GAD (generalized anxiety disorder)    GERD (gastroesophageal reflux disease)    Hepatitis C    Hypercholesterolemia    Hypertension    Middle cerebral artery aneurysm    Mild intellectual disabilities    Paranoia (HCC)    Pneumonia 2017   Psoriasis    Psoriasis vulgaris    Repeated falls    Schizoaffective disorder, depressive type (HCC)    Schizophrenia (HCC)    diagnosed at age 25, treated by ACT team    Past Surgical History:  Procedure Laterality Date   CHEST TUBE INSERTION     COLONOSCOPY  Nov 2011   Dr. Samuella Cota: normal, internal hemorrhoids   COLONOSCOPY  Sept 2009   small internal hemorrhoids, normal    ESOPHAGOGASTRODUODENOSCOPY  June 2010   Dr. Samuella Cota: normal   ESOPHAGOGASTRODUODENOSCOPY  Jan 2009   Dr. Allena Katz: linear esophagitis, mild stricture, diffuse gastritis and mild duodenitis, PATH: chronic gastritis, negative H.pylori    FOOT SURGERY     right    Family History  Problem Relation Age of Onset   COPD Father    Colon cancer Neg Hx    Social History:  reports that he has quit smoking. His smoking use included cigarettes. He smoked  an average of 1 pack per day. He has been exposed to tobacco smoke. He has never used smokeless tobacco. He reports that he does not currently use drugs. He reports that he does not drink alcohol.  Allergies:  Allergies  Allergen Reactions   Propoxyphene Rash    Medications Prior to Admission  Medication Sig Dispense Refill   albuterol (VENTOLIN HFA) 108 (90 Base) MCG/ACT inhaler Inhale 2 puffs into the lungs every 6 (six) hours as needed for wheezing or shortness of breath. 8 g 5   aspirin 81 MG chewable tablet Chew 81 mg by mouth daily.     benztropine (COGENTIN) 0.5 MG tablet Take 1 tablet (0.5 mg total) by mouth 2 (two) times daily. 60 tablet 0   bisacodyl (BISACODYL LAXATIVE) 10 MG suppository Place 10 mg rectally See admin instructions. Insert 1 suppository rectally every other night at bedtime.     finasteride (PROSCAR) 5 MG tablet Take 5 mg by mouth daily.     FLUoxetine (PROZAC) 20 MG capsule Take 1 capsule (20 mg total) by mouth daily. 30 capsule 0   Fluticasone-Umeclidin-Vilant (TRELEGY ELLIPTA) 100-62.5-25 MCG/ACT AEPB Inhale 1 puff into the lungs daily.     gabapentin (NEURONTIN) 100 MG capsule Take 100 mg by mouth 3 (three) times daily.     levETIRAcetam (KEPPRA) 500 MG tablet Take 1 tablet (500 mg total) by mouth 2 (two) times daily. 60 tablet 0  lovastatin (MEVACOR) 20 MG tablet Take 1 tablet (20 mg total) by mouth daily. 30 tablet 0   midodrine (PROAMATINE) 10 MG tablet Take 1 tablet (10 mg total) by mouth 3 (three) times daily with meals. 90 tablet 0   montelukast (SINGULAIR) 10 MG tablet Take 1 tablet (10 mg total) by mouth at bedtime. 30 tablet 0   Multiple Vitamin (MULTIVITAMIN WITH MINERALS) TABS tablet Take 1 tablet by mouth daily.     OLANZapine (ZYPREXA) 15 MG tablet Take 15 mg by mouth at bedtime.     OLANZapine (ZYPREXA) 5 MG tablet Take 5 mg by mouth in the morning.     omeprazole (PRILOSEC) 20 MG capsule Take 20 mg by mouth daily.     senna (SENOKOT) 8.6 MG  TABS tablet Take 1 tablet by mouth daily.     traZODone (DESYREL) 50 MG tablet Take 1 tablet (50 mg total) by mouth at bedtime. 30 tablet 0   valbenazine (INGREZZA) 40 MG capsule Take 40 mg by mouth daily.     cetirizine (ZYRTEC) 10 MG tablet Take 1 tablet (10 mg total) by mouth daily. (Patient not taking: Reported on 03/19/2023) 30 tablet 0   chlorproMAZINE (THORAZINE) 25 MG tablet Take 1 tablet (25 mg total) by mouth daily. (Patient not taking: Reported on 01/05/2023) 30 tablet 0   OLANZapine (ZYPREXA) 20 MG tablet Take 1 tablet (20 mg total) by mouth at bedtime. (Patient not taking: Reported on 05/02/2023) 30 tablet 0   tamsulosin (FLOMAX) 0.4 MG CAPS capsule Take 1 capsule (0.4 mg total) by mouth every evening. (Patient not taking: Reported on 03/19/2023) 30 capsule 0    No results found for this or any previous visit (from the past 48 hour(s)). No results found.  Review of Systems  All other systems reviewed and are negative.   HR 74 RR 12 BP 138/82 Physical Exam  GENERAL: The patient is AO x3, in no acute distress. HEENT: Head is normocephalic and atraumatic. EOMI are intact. Mouth is well hydrated and without lesions. NECK: Supple. No masses LUNGS: Clear to auscultation. No presence of rhonchi/wheezing/rales. Adequate chest expansion HEART: RRR, normal s1 and s2. ABDOMEN: Soft, nontender, no guarding, no peritoneal signs, and nondistended. BS +. No masses. EXTREMITIES: Without any cyanosis, clubbing, rash, lesions or edema. NEUROLOGIC: AOx3, no focal motor deficit. SKIN: no jaundice, no rashes  Assessment/Plan [name] is a 67 y.o. male with past medical history of Hepatitis C successfully treated in 2018 with Harvoni. Genotype 1A,  schizoaffective disorder, mild intellectual disability, nicotine dependence, COPD, generalized anxiety disorder , hypertension.  Who comes to the hospital for screening colonoscopy.  Will proceed with colonoscopy.  Dolores Frame,  MD 05/08/2023, 11:07 AM

## 2023-05-15 ENCOUNTER — Encounter (HOSPITAL_COMMUNITY): Payer: Self-pay | Admitting: Gastroenterology

## 2023-06-26 ENCOUNTER — Inpatient Hospital Stay
Admission: EM | Admit: 2023-06-26 | Discharge: 2023-07-04 | Disposition: A | Discharge status: Skilled Nursing Facility | Payer: 59 | Attending: Internal Medicine | Admitting: Internal Medicine

## 2023-06-26 ENCOUNTER — Emergency Department: Payer: 59

## 2023-06-26 ENCOUNTER — Other Ambulatory Visit: Payer: Self-pay

## 2023-06-26 DIAGNOSIS — J439 Emphysema, unspecified: Secondary | ICD-10-CM | POA: Diagnosis present

## 2023-06-26 DIAGNOSIS — J189 Pneumonia, unspecified organism: Secondary | ICD-10-CM | POA: Diagnosis present

## 2023-06-26 DIAGNOSIS — F411 Generalized anxiety disorder: Secondary | ICD-10-CM | POA: Diagnosis present

## 2023-06-26 DIAGNOSIS — N401 Enlarged prostate with lower urinary tract symptoms: Secondary | ICD-10-CM | POA: Diagnosis present

## 2023-06-26 DIAGNOSIS — Z66 Do not resuscitate: Secondary | ICD-10-CM | POA: Diagnosis present

## 2023-06-26 DIAGNOSIS — F0390 Unspecified dementia without behavioral disturbance: Secondary | ICD-10-CM | POA: Diagnosis present

## 2023-06-26 DIAGNOSIS — F251 Schizoaffective disorder, depressive type: Secondary | ICD-10-CM | POA: Diagnosis present

## 2023-06-26 DIAGNOSIS — Z515 Encounter for palliative care: Secondary | ICD-10-CM | POA: Diagnosis not present

## 2023-06-26 DIAGNOSIS — B9629 Other Escherichia coli [E. coli] as the cause of diseases classified elsewhere: Secondary | ICD-10-CM | POA: Diagnosis not present

## 2023-06-26 DIAGNOSIS — I700 Atherosclerosis of aorta: Secondary | ICD-10-CM | POA: Diagnosis present

## 2023-06-26 DIAGNOSIS — D638 Anemia in other chronic diseases classified elsewhere: Secondary | ICD-10-CM | POA: Insufficient documentation

## 2023-06-26 DIAGNOSIS — J440 Chronic obstructive pulmonary disease with acute lower respiratory infection: Secondary | ICD-10-CM | POA: Diagnosis present

## 2023-06-26 DIAGNOSIS — I951 Orthostatic hypotension: Secondary | ICD-10-CM | POA: Diagnosis not present

## 2023-06-26 DIAGNOSIS — Z825 Family history of asthma and other chronic lower respiratory diseases: Secondary | ICD-10-CM

## 2023-06-26 DIAGNOSIS — Z87891 Personal history of nicotine dependence: Secondary | ICD-10-CM

## 2023-06-26 DIAGNOSIS — Z1612 Extended spectrum beta lactamase (ESBL) resistance: Secondary | ICD-10-CM | POA: Diagnosis present

## 2023-06-26 DIAGNOSIS — Z8673 Personal history of transient ischemic attack (TIA), and cerebral infarction without residual deficits: Secondary | ICD-10-CM

## 2023-06-26 DIAGNOSIS — A419 Sepsis, unspecified organism: Principal | ICD-10-CM

## 2023-06-26 DIAGNOSIS — R509 Fever, unspecified: Secondary | ICD-10-CM | POA: Diagnosis present

## 2023-06-26 DIAGNOSIS — A4151 Sepsis due to Escherichia coli [E. coli]: Secondary | ICD-10-CM | POA: Diagnosis present

## 2023-06-26 DIAGNOSIS — Z1152 Encounter for screening for COVID-19: Secondary | ICD-10-CM | POA: Diagnosis not present

## 2023-06-26 DIAGNOSIS — I9589 Other hypotension: Secondary | ICD-10-CM | POA: Diagnosis not present

## 2023-06-26 DIAGNOSIS — D75838 Other thrombocytosis: Secondary | ICD-10-CM | POA: Insufficient documentation

## 2023-06-26 DIAGNOSIS — Z681 Body mass index (BMI) 19 or less, adult: Secondary | ICD-10-CM | POA: Diagnosis not present

## 2023-06-26 DIAGNOSIS — F70 Mild intellectual disabilities: Secondary | ICD-10-CM | POA: Diagnosis present

## 2023-06-26 DIAGNOSIS — I10 Essential (primary) hypertension: Secondary | ICD-10-CM | POA: Diagnosis present

## 2023-06-26 DIAGNOSIS — E43 Unspecified severe protein-calorie malnutrition: Secondary | ICD-10-CM | POA: Diagnosis present

## 2023-06-26 DIAGNOSIS — E876 Hypokalemia: Secondary | ICD-10-CM | POA: Diagnosis not present

## 2023-06-26 DIAGNOSIS — R338 Other retention of urine: Secondary | ICD-10-CM | POA: Diagnosis not present

## 2023-06-26 DIAGNOSIS — D649 Anemia, unspecified: Secondary | ICD-10-CM | POA: Diagnosis present

## 2023-06-26 DIAGNOSIS — Z888 Allergy status to other drugs, medicaments and biological substances status: Secondary | ICD-10-CM

## 2023-06-26 DIAGNOSIS — Z79899 Other long term (current) drug therapy: Secondary | ICD-10-CM

## 2023-06-26 DIAGNOSIS — I959 Hypotension, unspecified: Secondary | ICD-10-CM | POA: Diagnosis present

## 2023-06-26 DIAGNOSIS — E872 Acidosis, unspecified: Secondary | ICD-10-CM | POA: Diagnosis not present

## 2023-06-26 DIAGNOSIS — F250 Schizoaffective disorder, bipolar type: Secondary | ICD-10-CM | POA: Diagnosis present

## 2023-06-26 DIAGNOSIS — G9341 Metabolic encephalopathy: Secondary | ICD-10-CM | POA: Diagnosis present

## 2023-06-26 DIAGNOSIS — Z7982 Long term (current) use of aspirin: Secondary | ICD-10-CM

## 2023-06-26 DIAGNOSIS — Z87440 Personal history of urinary (tract) infections: Secondary | ICD-10-CM

## 2023-06-26 DIAGNOSIS — N390 Urinary tract infection, site not specified: Secondary | ICD-10-CM | POA: Diagnosis not present

## 2023-06-26 DIAGNOSIS — E7800 Pure hypercholesterolemia, unspecified: Secondary | ICD-10-CM | POA: Diagnosis present

## 2023-06-26 DIAGNOSIS — R6520 Severe sepsis without septic shock: Secondary | ICD-10-CM | POA: Diagnosis present

## 2023-06-26 DIAGNOSIS — Z8701 Personal history of pneumonia (recurrent): Secondary | ICD-10-CM

## 2023-06-26 DIAGNOSIS — K219 Gastro-esophageal reflux disease without esophagitis: Secondary | ICD-10-CM | POA: Diagnosis present

## 2023-06-26 DIAGNOSIS — Z8619 Personal history of other infectious and parasitic diseases: Secondary | ICD-10-CM

## 2023-06-26 LAB — CBC WITH DIFFERENTIAL/PLATELET
Abs Immature Granulocytes: 0.12 10*3/uL — ABNORMAL HIGH (ref 0.00–0.07)
Basophils Absolute: 0.1 10*3/uL (ref 0.0–0.1)
Basophils Relative: 0 %
Eosinophils Absolute: 0.1 10*3/uL (ref 0.0–0.5)
Eosinophils Relative: 0 %
HCT: 40.8 % (ref 39.0–52.0)
Hemoglobin: 12.4 g/dL — ABNORMAL LOW (ref 13.0–17.0)
Immature Granulocytes: 1 %
Lymphocytes Relative: 23 %
Lymphs Abs: 4.3 10*3/uL — ABNORMAL HIGH (ref 0.7–4.0)
MCH: 29.4 pg (ref 26.0–34.0)
MCHC: 30.4 g/dL (ref 30.0–36.0)
MCV: 96.7 fL (ref 80.0–100.0)
Monocytes Absolute: 0.8 10*3/uL (ref 0.1–1.0)
Monocytes Relative: 4 %
Neutro Abs: 13.2 10*3/uL — ABNORMAL HIGH (ref 1.7–7.7)
Neutrophils Relative %: 72 %
Platelets: 568 10*3/uL — ABNORMAL HIGH (ref 150–400)
RBC: 4.22 MIL/uL (ref 4.22–5.81)
RDW: 16.3 % — ABNORMAL HIGH (ref 11.5–15.5)
WBC: 18.4 10*3/uL — ABNORMAL HIGH (ref 4.0–10.5)
nRBC: 0 % (ref 0.0–0.2)

## 2023-06-26 LAB — URINALYSIS, W/ REFLEX TO CULTURE (INFECTION SUSPECTED)
Bilirubin Urine: NEGATIVE
Glucose, UA: NEGATIVE mg/dL
Hgb urine dipstick: NEGATIVE
Ketones, ur: NEGATIVE mg/dL
Nitrite: POSITIVE — AB
Protein, ur: 30 mg/dL — AB
Specific Gravity, Urine: 1.01 (ref 1.005–1.030)
Squamous Epithelial / HPF: NONE SEEN /HPF (ref 0–5)
WBC, UA: >50 WBC/hpf (ref 0–5)
pH: 5 (ref 5.0–8.0)

## 2023-06-26 LAB — RESP PANEL BY RT-PCR (RSV, FLU A&B, COVID)  RVPGX2
Influenza A by PCR: NEGATIVE
Influenza B by PCR: NEGATIVE
Resp Syncytial Virus by PCR: NEGATIVE
SARS Coronavirus 2 by RT PCR: NEGATIVE

## 2023-06-26 LAB — LACTIC ACID, PLASMA
Lactic Acid, Venous: 3.6 mmol/L (ref 0.5–1.9)
Lactic Acid, Venous: 3.4 mmol/L (ref 0.5–1.9)

## 2023-06-26 LAB — COMPREHENSIVE METABOLIC PANEL
ALT: 30 U/L (ref 0–44)
AST: 26 U/L (ref 15–41)
Albumin: 2.1 g/dL — ABNORMAL LOW (ref 3.5–5.0)
Alkaline Phosphatase: 54 U/L (ref 38–126)
Anion gap: 8 (ref 5–15)
BUN: 36 mg/dL — ABNORMAL HIGH (ref 8–23)
CO2: 23 mmol/L (ref 22–32)
Calcium: 7.3 mg/dL — ABNORMAL LOW (ref 8.9–10.3)
Chloride: 114 mmol/L — ABNORMAL HIGH (ref 98–111)
Creatinine, Ser: 1.19 mg/dL (ref 0.61–1.24)
GFR, Estimated: >60 mL/min (ref >60)
Glucose, Bld: 178 mg/dL — ABNORMAL HIGH (ref 70–99)
Potassium: 3.4 mmol/L — ABNORMAL LOW (ref 3.5–5.1)
Sodium: 145 mmol/L (ref 135–145)
Total Bilirubin: 0.6 mg/dL (ref 0.3–1.2)
Total Protein: 5.8 g/dL — ABNORMAL LOW (ref 6.5–8.1)

## 2023-06-26 LAB — PROTIME-INR
INR: 1.3 — ABNORMAL HIGH (ref 0.8–1.2)
Prothrombin Time: 16.2 seconds — ABNORMAL HIGH (ref 11.4–15.2)

## 2023-06-26 LAB — CBG MONITORING, ED: Glucose-Capillary: 125 mg/dL — ABNORMAL HIGH (ref 70–99)

## 2023-06-26 LAB — APTT: aPTT: 28 seconds (ref 24–36)

## 2023-06-26 MED ORDER — ACETAMINOPHEN 650 MG RE SUPP: 650.0000 mg | Freq: Four times a day (QID) | RECTAL | Status: DC | PRN

## 2023-06-26 MED ORDER — SODIUM CHLORIDE 0.9 % IV SOLN
1.0000 g | INTRAVENOUS | Status: DC
  Filled 2023-06-26: qty 10

## 2023-06-26 MED ORDER — MONTELUKAST SODIUM 10 MG PO TABS
10.0000 mg | ORAL_TABLET | Freq: Every day | ORAL | Status: DC
  Administered 2023-06-27 – 2023-07-03 (×7): 10 mg via ORAL
  Filled 2023-06-26 (×8): qty 1

## 2023-06-26 MED ORDER — METRONIDAZOLE 500 MG/100ML IV SOLN
500.0000 mg | Freq: Once | INTRAVENOUS | Status: AC
  Administered 2023-06-26: 500 mg via INTRAVENOUS
  Filled 2023-06-26: qty 100

## 2023-06-26 MED ORDER — SODIUM CHLORIDE 0.9 % IV BOLUS
1000.0000 mL | Freq: Once | INTRAVENOUS | Status: AC
  Administered 2023-06-26: 1000 mL via INTRAVENOUS

## 2023-06-26 MED ORDER — VANCOMYCIN HCL IN DEXTROSE 1-5 GM/200ML-% IV SOLN
1000.0000 mg | Freq: Once | INTRAVENOUS | Status: AC
  Administered 2023-06-26: 1000 mg via INTRAVENOUS
  Filled 2023-06-26: qty 200

## 2023-06-26 MED ORDER — OLANZAPINE 10 MG PO TBDP
10.0000 mg | ORAL_TABLET | Freq: Every day | ORAL | Status: DC
  Filled 2023-06-26: qty 2

## 2023-06-26 MED ORDER — FINASTERIDE 5 MG PO TABS
5.0000 mg | ORAL_TABLET | Freq: Every day | ORAL | Status: DC
  Administered 2023-06-27 – 2023-07-04 (×8): 5 mg via ORAL
  Filled 2023-06-26 (×8): qty 1

## 2023-06-26 MED ORDER — INSULIN ASPART 100 UNIT/ML IJ SOLN: 0.0000 [IU] | Freq: Every day | INTRAMUSCULAR | Status: DC

## 2023-06-26 MED ORDER — SODIUM CHLORIDE 0.9 % IV SOLN
500.0000 mg | INTRAVENOUS | Status: DC
  Administered 2023-06-26 – 2023-06-27 (×2): 500 mg via INTRAVENOUS
  Filled 2023-06-26 (×2): qty 5

## 2023-06-26 MED ORDER — POLYETHYLENE GLYCOL 3350 17 G PO PACK
17.0000 g | PACK | Freq: Every day | ORAL | Status: DC | PRN
  Administered 2023-06-30: 17 g via ORAL
  Filled 2023-06-26: qty 1

## 2023-06-26 MED ORDER — MIDODRINE HCL 5 MG PO TABS: 5.0000 mg | ORAL_TABLET | Freq: Three times a day (TID) | ORAL | Status: DC

## 2023-06-26 MED ORDER — SODIUM CHLORIDE 0.9 % IV SOLN
2.0000 g | Freq: Once | INTRAVENOUS | Status: AC
  Administered 2023-06-26: 2 g via INTRAVENOUS
  Filled 2023-06-26: qty 12.5

## 2023-06-26 MED ORDER — BENZTROPINE MESYLATE 0.5 MG PO TABS
0.5000 mg | ORAL_TABLET | Freq: Two times a day (BID) | ORAL | Status: DC
  Administered 2023-06-27 – 2023-07-04 (×15): 0.5 mg via ORAL
  Filled 2023-06-26 (×16): qty 1

## 2023-06-26 MED ORDER — SODIUM CHLORIDE 0.9% FLUSH
3.0000 mL | Freq: Two times a day (BID) | INTRAVENOUS | Status: DC
  Administered 2023-06-26 – 2023-07-03 (×11): 3 mL via INTRAVENOUS

## 2023-06-26 MED ORDER — TRAZODONE HCL 50 MG PO TABS
50.0000 mg | ORAL_TABLET | Freq: Every day | ORAL | Status: DC
  Administered 2023-06-27 – 2023-07-03 (×7): 50 mg via ORAL
  Filled 2023-06-26 (×8): qty 1

## 2023-06-26 MED ORDER — LACTATED RINGERS IV SOLN: INTRAVENOUS | Status: DC

## 2023-06-26 MED ORDER — LEVETIRACETAM 500 MG PO TABS
500.0000 mg | ORAL_TABLET | Freq: Two times a day (BID) | ORAL | Status: DC
  Administered 2023-06-27 – 2023-07-04 (×15): 500 mg via ORAL
  Filled 2023-06-26 (×17): qty 1

## 2023-06-26 MED ORDER — SODIUM CHLORIDE 0.9 % IV BOLUS (SEPSIS)
1000.0000 mL | Freq: Once | INTRAVENOUS | Status: AC
  Administered 2023-06-26: 1000 mL via INTRAVENOUS

## 2023-06-26 MED ORDER — ATORVASTATIN CALCIUM 20 MG PO TABS: 20.0000 mg | ORAL_TABLET | Freq: Every day | ORAL | Status: DC

## 2023-06-26 MED ORDER — BISACODYL 10 MG RE SUPP
10.0000 mg | RECTAL | Status: DC
  Administered 2023-06-27: 10 mg via RECTAL
  Filled 2023-06-26 (×4): qty 1

## 2023-06-26 MED ORDER — POTASSIUM CHLORIDE 20 MEQ PO PACK
40.0000 meq | PACK | Freq: Once | ORAL | Status: DC
  Filled 2023-06-26 (×2): qty 2

## 2023-06-26 MED ORDER — FLUTICASONE FUROATE-VILANTEROL 100-25 MCG/ACT IN AEPB
1.0000 | INHALATION_SPRAY | Freq: Every day | RESPIRATORY_TRACT | Status: DC
  Administered 2023-06-27 – 2023-07-04 (×8): 1 via RESPIRATORY_TRACT
  Filled 2023-06-26 (×2): qty 28

## 2023-06-26 MED ORDER — ENOXAPARIN SODIUM 40 MG/0.4ML IJ SOSY
40.0000 mg | PREFILLED_SYRINGE | INTRAMUSCULAR | Status: DC
  Administered 2023-06-27 – 2023-07-04 (×8): 40 mg via SUBCUTANEOUS
  Filled 2023-06-26 (×8): qty 0.4

## 2023-06-26 MED ORDER — TAMSULOSIN HCL 0.4 MG PO CAPS
0.4000 mg | ORAL_CAPSULE | Freq: Every day | ORAL | Status: DC
  Administered 2023-06-27 – 2023-07-02 (×6): 0.4 mg via ORAL
  Filled 2023-06-26 (×7): qty 1

## 2023-06-26 MED ORDER — PRAVASTATIN SODIUM 20 MG PO TABS
20.0000 mg | ORAL_TABLET | Freq: Every day | ORAL | Status: DC
  Administered 2023-06-27 – 2023-07-03 (×7): 20 mg via ORAL
  Filled 2023-06-26 (×7): qty 1

## 2023-06-26 MED ORDER — ACETAMINOPHEN 325 MG PO TABS: 650.0000 mg | ORAL_TABLET | Freq: Four times a day (QID) | ORAL | Status: DC | PRN

## 2023-06-26 MED ORDER — INSULIN ASPART 100 UNIT/ML IJ SOLN: 0.0000 [IU] | Freq: Three times a day (TID) | INTRAMUSCULAR | Status: DC

## 2023-06-26 NOTE — Assessment & Plan Note (Deleted)
Patient was recently treated as an outpatient for UTI after finishing antibiotic course.  In spite of this presents with increasing confusion that is most consistent with metabolic encephalopathy.  Patient is nonfocal on exam.  Patient is found to have a leukocytosis, lactic acidosis, temp of 100.6, soft blood pressure.  Patient has received 2 L of boluses.  At this time lactic acid is trending downwards, patient is looking nontoxic.  Source is felt to be UTI due to failure of outpatient therapy due to urinary retention, now that it has been catheterized.  I anticipate patient should have a good clinical response to ceftriaxone, cultures have been sent follow-up same  Secondary concern is pneumonia, therefore I will add azithromycin to ceftriaxone

## 2023-06-26 NOTE — Consult Note (Signed)
CODE SEPSIS - PHARMACY COMMUNICATION  **Broad Spectrum Antibiotics should be administered within 1 hour of Sepsis diagnosis**  Time Code Sepsis Called/Page Received: 1715  Antibiotics Ordered: cefepime, vancomycin, and Flagyl  Time of 1st antibiotic administration: 1734  Additional action taken by pharmacy: Melvia Heaps ,PharmD Clinical Pharmacist  06/26/2023  5:47 PM

## 2023-06-26 NOTE — ED Notes (Signed)
Pt unable to urinate. Pt bladder scanned. Bladder scan showed over . RN attempted straight cath. Unable to get past prostate.

## 2023-06-26 NOTE — H&P (Signed)
History and Physical    Patient: Ian Medina:[id number] DOB: 16-Sep-1956 DOA: 06/26/2023 DOS: the patient was seen and examined on 06/26/2023 PCP: Pcp, No  Patient coming from: SNF  Chief Complaint:  Chief Complaint  Patient presents with   Recurrent UTI   Fever   HPI: Ian Medina is a 67 y.o. male with medical history significant of  chronic prior mild intellectual disabilities. Patient is a resdient of supervised care faciility. Inspite of this, patient is reported "AAOx4 " per report receved from ER attending.  Patient had recently been diagnosed with a urinary tract infection and finished antibiotic for same.  Earlier today patient was noted to be more confused and oriented to self only.  He was unable to tell his location, what year this was.  Patient was therefore sent to [facility] ER.  ER workup as noted below, notable for lactic acidosis, leukocytosis, patient is felt to have urinary retention, urinary tract infection, possible pneumonia.  Medical evaluation is sought.  Patient at this office time offers no complaints.  Claims that he has been here since "yesterday ".  Patient knew he was in [place], however could not name the hospital.  Patient able to give name and date of birth.  No distress is apparent.  Patient is unable to provide detailed account of events of today.  Patient does not report any belly pain, perineal pain or any dysuria.  No report of fever or rigors is obtained from outside facility Review of Systems: unable to review all systems due to the inability of the patient to answer questions. Past Medical History:  Diagnosis Date   Anxiety    Atherosclerosis of aorta (HCC)    Auditory hallucination    Cerebral aneurysm, nonruptured    COPD with emphysema (HCC)    Drug abuse (HCC)    Dysphagia, oropharyngeal phase    GAD (generalized anxiety disorder)    GERD (gastroesophageal reflux disease)    Hepatitis C    Hypercholesterolemia    Hypertension     Middle cerebral artery aneurysm    Mild intellectual disabilities    Paranoia (HCC)    Pneumonia 2017   Psoriasis    Psoriasis vulgaris    Repeated falls    Schizoaffective disorder, depressive type (HCC)    Schizophrenia (HCC)    diagnosed at age 74, treated by ACT team   Past Surgical History:  Procedure Laterality Date   CHEST TUBE INSERTION     COLONOSCOPY  Nov 2011   Dr. Samuella Cota: normal, internal hemorrhoids   COLONOSCOPY  Sept 2009   small internal hemorrhoids, normal    COLONOSCOPY WITH PROPOFOL N/A 05/08/2023   Procedure: COLONOSCOPY WITH PROPOFOL;  Surgeon: Dolores Frame, MD;  [facility];  Service: Gastroenterology;  Laterality: N/A;  10:00am; asa 3   ESOPHAGOGASTRODUODENOSCOPY  June 2010   Dr. Samuella Cota: normal   ESOPHAGOGASTRODUODENOSCOPY  Jan 2009   Dr. Allena Katz: linear esophagitis, mild stricture, diffuse gastritis and mild duodenitis, PATH: chronic gastritis, negative H.pylori    FOOT SURGERY     right   POLYPECTOMY  05/08/2023   Procedure: POLYPECTOMY;  Surgeon: Dolores Frame, MD;  Location: [facility];  Service: Gastroenterology;;   Social History:  reports that he has quit smoking. His smoking use included cigarettes. He has been exposed to tobacco smoke. He has never used smokeless tobacco. He reports that he does not currently use drugs. He reports that he does not drink alcohol.  Allergies  Allergen  Reactions   Propoxyphene Rash    Family History  Problem Relation Age of Onset   COPD Father    Colon cancer Neg Hx     Prior to Admission medications   Medication Sig Start Date End Date Taking? Authorizing Provider  albuterol (VENTOLIN HFA) 108 (90 Base) MCG/ACT inhaler Inhale 2 puffs into the lungs every 6 (six) hours as needed for wheezing or shortness of breath. 06/07/22   Gilles Chiquito, MD  aspirin 81 MG chewable tablet Chew 81 mg by mouth daily.    [provider]  benztropine (COGENTIN) 0.5 MG tablet Take 1  tablet (0.5 mg total) by mouth 2 (two) times daily. 11/09/22   Sunnie Nielsen, DO  bisacodyl (BISACODYL LAXATIVE) 10 MG suppository Place 10 mg rectally See admin instructions. Insert 1 suppository rectally every other night at bedtime.    [provider]  cetirizine (ZYRTEC) 10 MG tablet Take 1 tablet (10 mg total) by mouth daily. Patient not taking: Reported on 03/19/2023 06/07/22   Gilles Chiquito, MD  chlorproMAZINE (THORAZINE) 25 MG tablet Take 1 tablet (25 mg total) by mouth daily. Patient not taking: Reported on 01/05/2023 11/09/22   Sunnie Nielsen, DO  finasteride (PROSCAR) 5 MG tablet Take 5 mg by mouth daily. 12/09/22   [provider]  FLUoxetine (PROZAC) 20 MG capsule Take 1 capsule (20 mg total) by mouth daily. 06/07/22   Gilles Chiquito, MD  Fluticasone-Umeclidin-Vilant (TRELEGY ELLIPTA) 100-62.5-25 MCG/ACT AEPB Inhale 1 puff into the lungs daily.    [provider]  gabapentin (NEURONTIN) 100 MG capsule Take 100 mg by mouth 3 (three) times daily. 01/25/22   [provider]  levETIRAcetam (KEPPRA) 500 MG tablet Take 1 tablet (500 mg total) by mouth 2 (two) times daily. 10/01/22   Bethann Berkshire, MD  lovastatin (MEVACOR) 20 MG tablet Take 1 tablet (20 mg total) by mouth daily. 06/07/22   Gilles Chiquito, MD  midodrine (PROAMATINE) 10 MG tablet Take 1 tablet (10 mg total) by mouth 3 (three) times daily with meals. 11/09/22   Sunnie Nielsen, DO  montelukast (SINGULAIR) 10 MG tablet Take 1 tablet (10 mg total) by mouth at bedtime. 06/07/22   Gilles Chiquito, MD  Multiple Vitamin (MULTIVITAMIN WITH MINERALS) TABS tablet Take 1 tablet by mouth daily. 11/09/22   Sunnie Nielsen, DO  OLANZapine (ZYPREXA) 15 MG tablet Take 15 mg by mouth at bedtime.    [provider]  OLANZapine (ZYPREXA) 20 MG tablet Take 1 tablet (20 mg total) by mouth at bedtime. Patient not taking: Reported on 05/02/2023 06/07/22   Gilles Chiquito, MD  OLANZapine  (ZYPREXA) 5 MG tablet Take 5 mg by mouth in the morning.    [provider]  omeprazole (PRILOSEC) 20 MG capsule Take 20 mg by mouth daily. 12/06/22   [provider]  senna (SENOKOT) 8.6 MG TABS tablet Take 1 tablet by mouth daily.    [provider]  tamsulosin (FLOMAX) 0.4 MG CAPS capsule Take 1 capsule (0.4 mg total) by mouth every evening. Patient not taking: Reported on 03/19/2023 11/09/22   Sunnie Nielsen, DO  traZODone (DESYREL) 50 MG tablet Take 1 tablet (50 mg total) by mouth at bedtime. 06/07/22   Gilles Chiquito, MD  valbenazine [facility]) 40 MG capsule Take 40 mg by mouth daily.    [provider]    Physical Exam: Vitals:   06/26/23 1930 06/26/23 1945 06/26/23 2000 06/26/23 2020  BP: (!) 102/51  Marland Kitchen)  102/53   Pulse: 87 92 88 94  Resp:   20   Temp:   99.6 F (37.6 C)   TempSrc:   Oral   SpO2: 94% 96% 99% 98%   Thin gentleman, laying in stretcher, restful, arousable pleasantly interactive.  However patient is inattentive and often does not answer the question asked.  No distress.  Patient is able to tell me that he is in [place], is able to give his name and date of birth.  However not able to tell me the day of the week or the month. Respiratory exam: Bilateral intravesicular Cardiovascular exam S1-S2 normal Abdomen all quadrants soft nontender Extremities warm without edema, patient moving them purposefully, able to drink himself with a straw No edema Foley catheter in situ draining tan-colored urine.  Perineal sensation is intact, no spinal tenderness or signs of trauma Data Reviewed:  Labs on Admission:  Results for orders placed or performed during the hospital encounter of 06/26/23 (from the past 24 hour(s))  Resp panel by RT-PCR (RSV, Flu A&B, Covid) Anterior Nasal Swab     Status: None   Collection Time: 06/26/23  5:23 PM   Specimen: Anterior Nasal Swab  Result Value Ref Range   SARS Coronavirus 2 by RT PCR NEGATIVE  NEGATIVE   Influenza A by PCR NEGATIVE NEGATIVE   Influenza B by PCR NEGATIVE NEGATIVE   Resp Syncytial Virus by PCR NEGATIVE NEGATIVE  Lactic acid, plasma     Status: Abnormal   Collection Time: 06/26/23  5:23 PM  Result Value Ref Range   Lactic Acid, Venous 3.6 (HH) 0.5 - 1.9 mmol/L  CBC with Differential     Status: Abnormal   Collection Time: 06/26/23  5:23 PM  Result Value Ref Range   WBC 18.4 (H) 4.0 - 10.5 K/uL   RBC 4.22 4.22 - 5.81 MIL/uL   Hemoglobin 12.4 (L) 13.0 - 17.0 g/dL   HCT 16.1 09.6 - 04.5 %   MCV 96.7 80.0 - 100.0 fL   MCH 29.4 26.0 - 34.0 pg   MCHC 30.4 30.0 - 36.0 g/dL   RDW 40.9 (H) 81.1 - 91.4 %   Platelets 568 (H) 150 - 400 K/uL   nRBC 0.0 0.0 - 0.2 %   Neutrophils Relative % 72 %   Neutro Abs 13.2 (H) 1.7 - 7.7 K/uL   Lymphocytes Relative 23 %   Lymphs Abs 4.3 (H) 0.7 - 4.0 K/uL   Monocytes Relative 4 %   Monocytes Absolute 0.8 0.1 - 1.0 K/uL   Eosinophils Relative 0 %   Eosinophils Absolute 0.1 0.0 - 0.5 K/uL   Basophils Relative 0 %   Basophils Absolute 0.1 0.0 - 0.1 K/uL   Immature Granulocytes 1 %   Abs Immature Granulocytes 0.12 (H) 0.00 - 0.07 K/uL  Protime-INR     Status: Abnormal   Collection Time: 06/26/23  5:23 PM  Result Value Ref Range   Prothrombin Time 16.2 (H) 11.4 - 15.2 seconds   INR 1.3 (H) 0.8 - 1.2  APTT     Status: None   Collection Time: 06/26/23  5:23 PM  Result Value Ref Range   aPTT 28 24 - 36 seconds  Urinalysis, w/ Reflex to Culture (Infection Suspected) -Urine, Clean Catch     Status: Abnormal   Collection Time: 06/26/23  5:23 PM  Result Value Ref Range   Specimen Source URINE, CLEAN CATCH    Color, Urine YELLOW (A) YELLOW   APPearance CLOUDY (A) CLEAR  Specific Gravity, Urine 1.010 1.005 - 1.030   pH 5.0 5.0 - 8.0   Glucose, UA NEGATIVE NEGATIVE mg/dL   Hgb urine dipstick NEGATIVE NEGATIVE   Bilirubin Urine NEGATIVE NEGATIVE   Ketones, ur NEGATIVE NEGATIVE mg/dL   Protein, ur 30 (A) NEGATIVE mg/dL   Nitrite  POSITIVE (A) NEGATIVE   Leukocytes,Ua LARGE (A) NEGATIVE   RBC / HPF 6-10 0 - 5 RBC/hpf   WBC, UA >50 0 - 5 WBC/hpf   Bacteria, UA FEW (A) NONE SEEN   Squamous Epithelial / HPF NONE SEEN 0 - 5 /HPF   WBC Clumps PRESENT   Lactic acid, plasma     Status: Abnormal   Collection Time: 06/26/23  7:14 PM  Result Value Ref Range   Lactic Acid, Venous 3.4 (HH) 0.5 - 1.9 mmol/L  Comprehensive metabolic panel     Status: Abnormal   Collection Time: 06/26/23  8:18 PM  Result Value Ref Range   Sodium 145 135 - 145 mmol/L   Potassium 3.4 (L) 3.5 - 5.1 mmol/L   Chloride 114 (H) 98 - 111 mmol/L   CO2 23 22 - 32 mmol/L   Glucose, Bld 178 (H) 70 - 99 mg/dL   BUN 36 (H) 8 - 23 mg/dL   Creatinine, Ser 0.86 0.61 - 1.24 mg/dL   Calcium 7.3 (L) 8.9 - 10.3 mg/dL   Total Protein 5.8 (L) 6.5 - 8.1 g/dL   Albumin 2.1 (L) 3.5 - 5.0 g/dL   AST 26 15 - 41 U/L   ALT 30 0 - 44 U/L   Alkaline Phosphatase 54 38 - 126 U/L   Total Bilirubin 0.6 0.3 - 1.2 mg/dL   GFR, Estimated >57 >84 mL/min   Anion gap 8 5 - 15   Basic Metabolic Panel: Recent Labs  Lab 06/26/23 2018  NA 145  K 3.4*  CL 114*  CO2 23  GLUCOSE 178*  BUN 36*  CREATININE 1.19  CALCIUM 7.3*   Liver Function Tests: Recent Labs  Lab 06/26/23 2018  AST 26  ALT 30  ALKPHOS 54  BILITOT 0.6  PROT 5.8*  ALBUMIN 2.1*   No results for input(s): "LIPASE", "AMYLASE" in the last 168 hours. No results for input(s): "AMMONIA" in the last 168 hours. CBC: Recent Labs  Lab 06/26/23 1723  WBC 18.4*  NEUTROABS 13.2*  HGB 12.4*  HCT 40.8  MCV 96.7  PLT 568*   Cardiac Enzymes: No results for input(s): "CKTOTAL", "CKMB", "CKMBINDEX", "TROPONINIHS" in the last 168 hours.  BNP (last 3 results) No results for input(s): "PROBNP" in the last 8760 hours. CBG: No results for input(s): "GLUCAP" in the last 168 hours.  Radiological Exams on Admission:  DG Chest Port 1 View  Result Date: 06/26/2023 CLINICAL DATA:  Fever, sepsis, urinary tract  infection EXAM: PORTABLE CHEST 1 VIEW COMPARISON:  10/26/2022, 09/24/2022 FINDINGS: Single frontal view of the chest demonstrates an unremarkable cardiac silhouette. There is bilateral interstitial prominence, greatest at the lung bases, consistent with chronic scarring. Asymmetric ground-glass density at the right lung base could reflect superimposed pneumonia or aspiration. No effusion or pneumothorax. No acute bony abnormality. IMPRESSION: 1. Chronic background parenchymal lung scarring, with asymmetric right basilar ground-glass airspace disease consistent with acute pneumonia. Electronically Signed   By: Sharlet Salina M.D.   On: 06/26/2023 17:51    EKG: Independently reviewed. Moderate aritifact present suspect sinus rhythm. No reason to suspect cardiac issues clinicaly speaking.   Assessment and Plan: Urinary retention due to  benign prostatic hyperplasia Was diagnosed by ER attending while via bladder scan, patient has successful he had urethral catheterization and is now freely urinating.  Monitor intake and output.  Given documentation of prior episodes of urinary retention as well as current exam, I do not think that this is a neurologic issue.  Will monitor clinically outpatient urology follow-up would be needed  Sepsis secondary to UTI [facility]) Patient was recently treated as an outpatient for UTI after finishing antibiotic course.  In spite of this presents with increasing confusion that is most consistent with metabolic encephalopathy.  Patient is nonfocal on exam.  Patient is found to have a leukocytosis, lactic acidosis, temp of 100.6, soft blood pressure.  Patient has received 2 L of boluses.  At this time lactic acid is trending downwards, patient is looking nontoxic.  Source is felt to be UTI due to failure of outpatient therapy due to urinary retention, now that it has been catheterized.  I anticipate patient should have a good clinical response to ceftriaxone, cultures have been sent  follow-up same  Secondary concern is pneumonia, therefore I will add azithromycin to ceftriaxone  Patient's record obtained from outside facility have been printed says that is part of the med list has been cut out.  It seems that the patient had received ceftriaxone recently.  [facility] is working on obtaining an accurate med list, currently do review the medication reconciliation at that time.  At this time I will order time sensitive medications    Advance Care Planning:   Code Status: Prior no code status documentaion in EPIC. Patinet seems to have been Full code thus far at [facility]. Documentation from outside facility indicates he is DNR . I called [name] at [phone] who advised that patient is DNR. With no other restrictions in place. She was updated with medical course as well.   Consults: consider routine urology eval.  Family Communication: no family point of contact.  Severity of Illness: The appropriate patient status for this patient is INPATIENT. Inpatient status is judged to be reasonable and necessary in order to provide the required intensity of service to ensure the patient's safety. The patient's presenting symptoms, physical exam findings, and initial radiographic and laboratory data in the context of their chronic comorbidities is felt to place them at high risk for further clinical deterioration. Furthermore, it is not anticipated that the patient will be medically stable for discharge from the hospital within 2 midnights of admission.   * I certify that at the point of admission it is my clinical judgment that the patient will require inpatient hospital care spanning beyond 2 midnights from the point of admission due to high intensity of service, high risk for further deterioration and high frequency of surveillance required.*  Author: Nolberto Hanlon, MD 06/26/2023 9:15 PM  For on call review [email or web address].

## 2023-06-26 NOTE — Sepsis Progress Note (Signed)
Code sepsis protocol being monitored by e[employer]. 

## 2023-06-26 NOTE — ED Notes (Signed)
Patient was able to awaken with physical contact.  Each time he was awakened for medication, he would open his eyes, make some noises, and fall back asleep.  Patient is unable to remain alert enough to swallow at this point.  Three separate attempts were made to awaken him, and each time he would fall back asleep before attempting to take his medications.

## 2023-06-26 NOTE — Assessment & Plan Note (Addendum)
Was diagnosed by ER attending while via bladder scan and Foley catheter was placed.  Patient started on Flomax and Proscar.  Urinated after Foley removal.  Since patient is orthostatic will see if we can switch Flomax over to Rapaflo.

## 2023-06-26 NOTE — ED Triage Notes (Signed)
Pt brought in via ems from [place] [facility]. Ems stated pt was recently on anti-b for a uti and began having a fever and AMS after anti b completion. Pt is shaky on arrival. Pt A&Ox1.

## 2023-06-26 NOTE — ED Provider Notes (Signed)
[facility] Provider Note    Event Date/Time   First MD Initiated Contact with Patient 06/26/23 1710     (approximate)  History   Chief Complaint: Fever, confusion  HPI  Ian Medina is a 67 y.o. male with a past medical history of anxiety, COPD, gastric reflux, hypertension, hyperlipidemia, schizophrenia, presents to the emergency department from his nursing facility for weakness and confusion and fever.  According to report patient is coming from his nursing facility usually alert and oriented x 4 but today only oriented to person and place not time or situation.  Patient is quite tremulous during my evaluation.  Reported fever to 101 at the facility, 100.6 per EMS.  Patient per report had a urinary tract infection recently and has completed a course of antibiotics.  Patient is unable to provide any additional details for his presentation today.  He is awake he is alert he is oriented to person and place but is slow to response and does appear confused.  Physical Exam   Triage Vital Signs: ED Triage Vitals  Encounter Vitals Group     BP      Systolic BP Percentile      Diastolic BP Percentile      Pulse      Resp      Temp      Temp src      SpO2      Weight      Height      Head Circumference      Peak Flow      Pain Score      Pain Loc      Pain Education      Exclude from Growth Chart     Most recent vital signs: There were no vitals filed for this visit.  General: Awake, no distress.  Tremulous CV:  Good peripheral perfusion.  Regular rhythm around 120 bpm Resp:  Normal effort.  Equal breath sounds bilaterally.  Abd:  No distention.  Soft, nontender.  No rebound or guarding.  Benign abdomen  ED Results / Procedures / Treatments   EKG  EKG viewed and interpreted by myself shows significant electrical interference as the patient is somewhat tremulous/rigors, appears to show sinus versus A-fib at 99 bpm with a narrow QRS, normal axis,  nonspecific ST changes.  RADIOLOGY  I have reviewed and interpreted chest x-ray images.  Patient appears to have some right-sided infiltrates. Radiology confirms right lower lobe infiltrate.   MEDICATIONS ORDERED IN ED: Medications  lactated ringers infusion (has no administration in time range)  sodium chloride 0.9 % bolus 1,000 mL (has no administration in time range)  ceFEPIme (MAXIPIME) 2 g in sodium chloride 0.9 % 100 mL IVPB (has no administration in time range)  metroNIDAZOLE (FLAGYL) IVPB 500 mg (has no administration in time range)  vancomycin (VANCOCIN) IVPB 1000 mg/200 mL premix (has no administration in time range)     IMPRESSION / MDM / ASSESSMENT AND PLAN / ED COURSE  I reviewed the triage vital signs and the nursing notes.  Patient's presentation is most consistent with acute presentation with potential threat to life or bodily function.  Patient presents emergency department for confusion weakness sent from his nursing facility.  Given the patient is tachycardic tachypneic with a reported fever to 100.6 by EMS we will check labs cultures start the patient broad-spectrum antibiotics.  Differentials quite broad but would include urinary tract infection, pneumonia, COVID among other infectious etiology.  We will IV hydrate and continue to closely monitor while awaiting results.  Patient's labs have resulted negative for COVID/flu/RSV.  Lactic acid is elevated at 3.6 and white blood cell count of 18.4 and a CBC again consistent with sepsis.  Patient's urinalysis has resulted showing significant amount of nitrite and white blood cells with white blood cell clumps likely negative of urinary tract infection.  Patient's chest x-ray also shows signs of right basilar pneumonia.  Patient is being covered broad-spectrum antibiotics receiving IV fluids.  Will admit to the hospital service for further workup and treatment.  CRITICAL CARE Performed by: Minna Antis   Total  critical care time: 30 minutes  Critical care time was exclusive of separately billable procedures and treating other patients.  Critical care was necessary to treat or prevent imminent or life-threatening deterioration.  Critical care was time spent personally by me on the following activities: development of treatment plan with patient and/or surrogate as well as nursing, discussions with consultants, evaluation of patient's response to treatment, examination of patient, obtaining history from patient or surrogate, ordering and performing treatments and interventions, ordering and review of laboratory studies, ordering and review of radiographic studies, pulse oximetry and re-evaluation of patient's condition.   FINAL CLINICAL IMPRESSION(S) / ED DIAGNOSES   Sepsis UTI Pneumonia  Note:  This document was prepared using Dragon voice recognition software and may include unintentional dictation errors.   Minna Antis, MD 06/26/23 Barry Brunner

## 2023-06-27 ENCOUNTER — Inpatient Hospital Stay: Payer: 59

## 2023-06-27 DIAGNOSIS — G9341 Metabolic encephalopathy: Secondary | ICD-10-CM

## 2023-06-27 DIAGNOSIS — N401 Enlarged prostate with lower urinary tract symptoms: Secondary | ICD-10-CM | POA: Diagnosis not present

## 2023-06-27 DIAGNOSIS — R338 Other retention of urine: Secondary | ICD-10-CM | POA: Diagnosis not present

## 2023-06-27 DIAGNOSIS — R6520 Severe sepsis without septic shock: Secondary | ICD-10-CM

## 2023-06-27 DIAGNOSIS — E872 Acidosis, unspecified: Secondary | ICD-10-CM

## 2023-06-27 DIAGNOSIS — E43 Unspecified severe protein-calorie malnutrition: Secondary | ICD-10-CM

## 2023-06-27 DIAGNOSIS — F250 Schizoaffective disorder, bipolar type: Secondary | ICD-10-CM

## 2023-06-27 DIAGNOSIS — I9589 Other hypotension: Secondary | ICD-10-CM

## 2023-06-27 DIAGNOSIS — N390 Urinary tract infection, site not specified: Secondary | ICD-10-CM | POA: Diagnosis not present

## 2023-06-27 DIAGNOSIS — D649 Anemia, unspecified: Secondary | ICD-10-CM

## 2023-06-27 DIAGNOSIS — E876 Hypokalemia: Secondary | ICD-10-CM

## 2023-06-27 DIAGNOSIS — A419 Sepsis, unspecified organism: Secondary | ICD-10-CM | POA: Diagnosis not present

## 2023-06-27 LAB — CBC
HCT: 28.8 % — ABNORMAL LOW (ref 39.0–52.0)
HCT: 31.1 % — ABNORMAL LOW (ref 39.0–52.0)
HCT: 31.2 % — ABNORMAL LOW (ref 39.0–52.0)
Hemoglobin: 9.3 g/dL — ABNORMAL LOW (ref 13.0–17.0)
Hemoglobin: 9.6 g/dL — ABNORMAL LOW (ref 13.0–17.0)
Hemoglobin: 9.7 g/dL — ABNORMAL LOW (ref 13.0–17.0)
MCH: 30 pg (ref 26.0–34.0)
MCH: 30.3 pg (ref 26.0–34.0)
MCH: 30.8 pg (ref 26.0–34.0)
MCHC: 30.9 g/dL (ref 30.0–36.0)
MCHC: 31.1 g/dL (ref 30.0–36.0)
MCHC: 32.3 g/dL (ref 30.0–36.0)
MCV: 95.4 fL (ref 80.0–100.0)
MCV: 97.2 fL (ref 80.0–100.0)
MCV: 97.5 fL (ref 80.0–100.0)
Platelets: 443 10*3/uL — ABNORMAL HIGH (ref 150–400)
Platelets: 452 10*3/uL — ABNORMAL HIGH (ref 150–400)
Platelets: 471 10*3/uL — ABNORMAL HIGH (ref 150–400)
RBC: 3.02 MIL/uL — ABNORMAL LOW (ref 4.22–5.81)
RBC: 3.2 MIL/uL — ABNORMAL LOW (ref 4.22–5.81)
RBC: 3.2 MIL/uL — ABNORMAL LOW (ref 4.22–5.81)
RDW: 16.2 % — ABNORMAL HIGH (ref 11.5–15.5)
RDW: 16.3 % — ABNORMAL HIGH (ref 11.5–15.5)
RDW: 16.3 % — ABNORMAL HIGH (ref 11.5–15.5)
WBC: 17.9 10*3/uL — ABNORMAL HIGH (ref 4.0–10.5)
WBC: 18.7 10*3/uL — ABNORMAL HIGH (ref 4.0–10.5)
WBC: 19.4 10*3/uL — ABNORMAL HIGH (ref 4.0–10.5)
nRBC: 0 % (ref 0.0–0.2)
nRBC: 0 % (ref 0.0–0.2)
nRBC: 0 % (ref 0.0–0.2)

## 2023-06-27 LAB — GLUCOSE, CAPILLARY
Glucose-Capillary: 134 mg/dL — ABNORMAL HIGH (ref 70–99)
Glucose-Capillary: 120 mg/dL — ABNORMAL HIGH (ref 70–99)
Glucose-Capillary: 99 mg/dL (ref 70–99)
Glucose-Capillary: 110 mg/dL — ABNORMAL HIGH (ref 70–99)
Glucose-Capillary: 116 mg/dL — ABNORMAL HIGH (ref 70–99)
Glucose-Capillary: 95 mg/dL (ref 70–99)

## 2023-06-27 LAB — BASIC METABOLIC PANEL
Anion gap: 4 — ABNORMAL LOW (ref 5–15)
BUN: 29 mg/dL — ABNORMAL HIGH (ref 8–23)
CO2: 27 mmol/L (ref 22–32)
Calcium: 7.3 mg/dL — ABNORMAL LOW (ref 8.9–10.3)
Chloride: 111 mmol/L (ref 98–111)
Creatinine, Ser: 1.18 mg/dL (ref 0.61–1.24)
GFR, Estimated: >60 mL/min (ref >60)
Glucose, Bld: 169 mg/dL — ABNORMAL HIGH (ref 70–99)
Potassium: 3.2 mmol/L — ABNORMAL LOW (ref 3.5–5.1)
Sodium: 142 mmol/L (ref 135–145)

## 2023-06-27 LAB — APTT: aPTT: 32 seconds (ref 24–36)

## 2023-06-27 LAB — PROTIME-INR
INR: 1.5 — ABNORMAL HIGH (ref 0.8–1.2)
Prothrombin Time: 18.2 seconds — ABNORMAL HIGH (ref 11.4–15.2)

## 2023-06-27 LAB — LACTIC ACID, PLASMA
Lactic Acid, Venous: 2.7 mmol/L (ref 0.5–1.9)
Lactic Acid, Venous: 1.5 mmol/L (ref 0.5–1.9)

## 2023-06-27 LAB — PROCALCITONIN
Procalcitonin: 0.18 ng/mL
Procalcitonin: 0.28 ng/mL

## 2023-06-27 LAB — CORTISOL-AM, BLOOD: Cortisol - AM: 64.8 ug/dL — ABNORMAL HIGH (ref 6.7–22.6)

## 2023-06-27 LAB — PHOSPHORUS: Phosphorus: 2.8 mg/dL (ref 2.5–4.6)

## 2023-06-27 LAB — MRSA NEXT GEN BY PCR, NASAL: MRSA by PCR Next Gen: NOT DETECTED

## 2023-06-27 LAB — TROPONIN I (HIGH SENSITIVITY)
Troponin I (High Sensitivity): 6 ng/L (ref <18)
Troponin I (High Sensitivity): 6 ng/L (ref <18)

## 2023-06-27 LAB — MAGNESIUM: Magnesium: 1.9 mg/dL (ref 1.7–2.4)

## 2023-06-27 MED ORDER — HYDROCORTISONE SOD SUC (PF) 100 MG IJ SOLR
50.0000 mg | INTRAMUSCULAR | Status: AC
  Administered 2023-06-27: 50 mg via INTRAVENOUS
  Filled 2023-06-27: qty 1

## 2023-06-27 MED ORDER — OLANZAPINE 5 MG PO TBDP
5.0000 mg | ORAL_TABLET | Freq: Every day | ORAL | Status: DC
  Administered 2023-06-27 – 2023-07-04 (×8): 5 mg via ORAL
  Filled 2023-06-27 (×8): qty 1

## 2023-06-27 MED ORDER — VALBENAZINE TOSYLATE 40 MG PO CAPS
40.0000 mg | ORAL_CAPSULE | Freq: Every day | ORAL | Status: DC
  Administered 2023-06-27 – 2023-07-04 (×8): 40 mg via ORAL
  Filled 2023-06-27 (×8): qty 1

## 2023-06-27 MED ORDER — LACTATED RINGERS IV SOLN: INTRAVENOUS | Status: DC

## 2023-06-27 MED ORDER — PANTOPRAZOLE SODIUM 40 MG PO TBEC
40.0000 mg | DELAYED_RELEASE_TABLET | Freq: Every day | ORAL | Status: DC
  Administered 2023-06-27 – 2023-07-04 (×8): 40 mg via ORAL
  Filled 2023-06-27 (×8): qty 1

## 2023-06-27 MED ORDER — POTASSIUM CHLORIDE CRYS ER 20 MEQ PO TBCR
40.0000 meq | EXTENDED_RELEASE_TABLET | Freq: Once | ORAL | Status: AC
  Administered 2023-06-27: 40 meq via ORAL
  Filled 2023-06-27: qty 2

## 2023-06-27 MED ORDER — FLUOXETINE HCL 20 MG PO CAPS
20.0000 mg | ORAL_CAPSULE | Freq: Every day | ORAL | Status: DC
  Administered 2023-06-27 – 2023-07-04 (×8): 20 mg via ORAL
  Filled 2023-06-27 (×8): qty 1

## 2023-06-27 MED ORDER — LACTATED RINGERS IV BOLUS: 1000.0000 mL | Freq: Once | INTRAVENOUS | Status: DC

## 2023-06-27 MED ORDER — CHLORHEXIDINE GLUCONATE CLOTH 2 % EX PADS
6.0000 | MEDICATED_PAD | Freq: Every day | CUTANEOUS | Status: DC
  Administered 2023-06-27 – 2023-07-03 (×8): 6 via TOPICAL

## 2023-06-27 MED ORDER — SODIUM CHLORIDE 0.9 % IV SOLN
2.0000 g | INTRAVENOUS | Status: DC
  Administered 2023-06-27 – 2023-06-28 (×2): 2 g via INTRAVENOUS
  Filled 2023-06-27: qty 20

## 2023-06-27 MED ORDER — ADULT MULTIVITAMIN W/MINERALS CH
1.0000 | ORAL_TABLET | Freq: Every day | ORAL | Status: DC
  Administered 2023-06-28 – 2023-07-04 (×7): 1 via ORAL
  Filled 2023-06-27 (×7): qty 1

## 2023-06-27 MED ORDER — MIDODRINE HCL 5 MG PO TABS
10.0000 mg | ORAL_TABLET | Freq: Three times a day (TID) | ORAL | Status: DC
  Administered 2023-06-27 – 2023-07-04 (×24): 10 mg via ORAL
  Filled 2023-06-27 (×24): qty 2

## 2023-06-27 MED ORDER — ENSURE ENLIVE PO LIQD
237.0000 mL | Freq: Three times a day (TID) | ORAL | Status: DC
  Administered 2023-06-27 – 2023-07-04 (×18): 237 mL via ORAL

## 2023-06-27 MED ORDER — POTASSIUM CHLORIDE 10 MEQ/100ML IV SOLN
10.0000 meq | INTRAVENOUS | Status: AC
  Administered 2023-06-27 (×4): 10 meq via INTRAVENOUS
  Filled 2023-06-27 (×4): qty 100

## 2023-06-27 MED ORDER — NOREPINEPHRINE 4 MG/250ML-% IV SOLN
2.0000 ug/min | INTRAVENOUS | Status: DC
  Administered 2023-06-27: 2 ug/min via INTRAVENOUS
  Filled 2023-06-27: qty 250

## 2023-06-27 MED ORDER — OLANZAPINE 5 MG PO TBDP
15.0000 mg | ORAL_TABLET | Freq: Every day | ORAL | Status: DC
  Administered 2023-06-27 – 2023-06-28 (×2): 15 mg via ORAL
  Filled 2023-06-27 (×2): qty 1

## 2023-06-27 MED ORDER — SODIUM CHLORIDE 0.9 % IV SOLN: 250.0000 mL | INTRAVENOUS | Status: DC

## 2023-06-27 NOTE — Assessment & Plan Note (Addendum)
Patient has been on midodrine the whole course.  Very orthostatic last night.  Add Florinef and give a fluid bolus.  TED hose.  Continue working with physical therapy.

## 2023-06-27 NOTE — ED Notes (Signed)
Pt moved to room 18.  Report off to alycia rn.

## 2023-06-27 NOTE — Assessment & Plan Note (Addendum)
Dietitian consultation.  Encouraged to eat.

## 2023-06-27 NOTE — Assessment & Plan Note (Addendum)
ESBL E. coli sepsis, present on admission.  Patient with acute metabolic encephalopathy, lactic acidosis, leukocytosis, fever and hypotension.  Patient required brief Levophed.  Now on midodrine.  Decreased rate of IV fluids.  Changed antibiotics to meropenum on 8/1.  Continue IV antibiotics since patient still not feeling well and not eating well.

## 2023-06-27 NOTE — TOC Initial Note (Signed)
Transition of Care [facility]) - Initial/Assessment Note    Patient Details  Name: Ian Medina MRN: [id number] Date of Birth: [birth year]  [facility] CM/SW Contact:    Kreg Shropshire, RN Phone Number: 06/27/2023, 11:39 AM  Clinical Narrative:                  Pt has Legal Guardian with [facility] DSS [place] [phone]. Carrie reached out to cm.   Pt is long-term resident at [facility]. Cm spoke with Kenney Houseman with admissions at [facility]. He is welcomed backed whenever medically ready for d/c. Legal guardian also agreed. Facility meets transportation needs for pt and any medication and physician needs at this time. According to PT/OT no recommendations for STR.   Readmission prevention screening completed.  Cm will continue to follow for d/c needs.   Barriers to Discharge: Continued Medical Work up   Patient Goals and CMS Choice   CMS Medicare.gov Compare Post Acute Care list provided to:: Legal Guardian Choice offered to / list presented to :  (Legal Guardian)      Expected Discharge Plan and Services       Living arrangements for the past 2 months: Skilled Nursing Facility                                      Prior Living Arrangements/Services Living arrangements for the past 2 months: Skilled Nursing Facility Lives with:: Facility Resident          Need for Family Participation in Patient Care: Yes (Comment) Care giver support system in place?: Yes (comment)      Activities of Daily Living      Permission Sought/Granted                  Emotional Assessment Appearance:: Appears stated age Attitude/Demeanor/Rapport: Engaged Affect (typically observed): Calm        Admission diagnosis:  Sepsis (HCC) [A41.9] Sepsis, due to unspecified organism, unspecified whether acute organ dysfunction present [facility]) [A41.9] Patient Active Problem List   Diagnosis Date Noted   Sepsis secondary to UTI (HCC) 06/26/2023    Sepsis (HCC) 06/26/2023   Abnormal ultrasound of liver 03/19/2023   History of hepatitis C 12/17/2022   Liver fibrosis 12/17/2022   Anemia 12/17/2022   Constipation 12/17/2022   Hypotension 10/31/2022   UTI (urinary tract infection) 10/29/2022   Aspiration pneumonia (HCC) 10/28/2022   Schizoaffective disorder, bipolar type (HCC) 10/27/2022   Protein-calorie malnutrition, severe 10/26/2022   Loss of weight 10/26/2022   Acute metabolic encephalopathy 10/24/2022   GERD (gastroesophageal reflux disease) 10/24/2022   Urinary retention due to benign prostatic hyperplasia 10/24/2022   Sepsis due to pneumonia (HCC) 09/24/2022   Falls    Multiple rib fractures    Cognitive decline 06/08/2014   Dysphagia 05/11/2014   Encounter for screening colonoscopy 03/12/2013   PCP:  Pcp, No Pharmacy:   [facility] [place] - [address] [place] [address] Phone: [phone] Fax: [phone]     Social Determinants of Health (SDOH) Social History: SDOH Screenings   Tobacco Use: Medium Risk (05/08/2023)   SDOH Interventions:     Readmission Risk Interventions     No data to display

## 2023-06-27 NOTE — Assessment & Plan Note (Signed)
Continue Ingrezza and Zyprexa

## 2023-06-27 NOTE — Assessment & Plan Note (Addendum)
Replace potassium again today.

## 2023-06-27 NOTE — ED Notes (Addendum)
Pt hypotensive.  Dr Arville Care messaged and dr Maryjean Ka here with pt in hallway .  Iv fluids infusing.  Nsr on monitor.  Skin warm and dry.  Urine in foley cath.  Pt arousable.  Charge nurse Morrie Sheldon rn aware.

## 2023-06-27 NOTE — Progress Notes (Signed)
Initial Nutrition Assessment  DOCUMENTATION CODES:   Severe malnutrition in context of chronic illness  INTERVENTION:   Ensure Enlive po TID, each supplement provides 350 kcal and 20 grams of protein.  Magic cup TID with meals, each supplement provides 290 kcal and 9 grams of protein  MVI po daily   Pt high refeed risk; recommend monitor potassium, magnesium and phosphorus labs daily until stable  Daily weights   NUTRITION DIAGNOSIS:   Severe Malnutrition related to chronic illness as evidenced by severe fat depletion, severe muscle depletion.  GOAL:   Patient will meet greater than or equal to 90% of their needs  MONITOR:   PO intake, Supplement acceptance, Labs, Weight trends, Skin, I & O's  REASON FOR ASSESSMENT:   Malnutrition Screening Tool    ASSESSMENT:   67 y/o male with h/o BPH, GERD, schizoaffective disorder, hep C, liver fibrosis, COPD, anxiety, cerebral aneurysm, IVDU (in remission) and dysphagia who is admitted with UTI, PNA and sepsis.  Met with pt in room today. Pt in good spirits and very talkative. Pt reports good appetite and oral intake at baseline. Pt reports eating a "good breakfast" this morning; pt documented to have eaten 50% of his breakfast. Pt is a poor historian. Spoke with RN at [facility] where pt resides. RN reports that pt with good appetite and oral intake at baseline; pt eating 75-100% of meals. RN reports that pt is on a mechanical soft diet with thin liquids. Pt gets Med Pass 2.0 supplements four times daily. Pt reports that he loves Ensure. RD will add supplements and MVI to help pt meet his estimated needs. Per chart, pt appears weight stable over the past 3 months and appears to have weight gain prior to April.   Medications reviewed and include: dulcolax, lovenox, protonix, Kcl, azithromycin, ceftriaxone, LRS @40ml /hr   Labs reviewed: K 3.2(L), BUN 29(H), P 2.8 wnl, Mg 1.9 wnl Wbc- 17.9(H), Hgb 9.6(L), Hct 31.1(L) Cbgs- 99,  120, 134 x 24 hrs   NUTRITION - FOCUSED PHYSICAL EXAM:  Flowsheet Row Most Recent Value  Orbital Region Moderate depletion  Upper Arm Region Severe depletion  Thoracic and Lumbar Region Severe depletion  Buccal Region Moderate depletion  Temple Region Moderate depletion  Clavicle Bone Region Severe depletion  Clavicle and Acromion Bone Region Severe depletion  Scapular Bone Region Severe depletion  Dorsal Hand Severe depletion  Patellar Region Severe depletion  Anterior Thigh Region Severe depletion  Posterior Calf Region Severe depletion  Edema (RD Assessment) None  Hair Reviewed  Eyes Reviewed  Mouth Reviewed  Skin Reviewed  Nails Reviewed   Diet Order:   Diet Order             DIET DYS 2 Room service appropriate? Yes; Fluid consistency: Thin  Diet effective now                  EDUCATION NEEDS:   No education needs have been identified at this time  Skin:  Skin Assessment: Reviewed RN Assessment (ecchymosis)  Last BM:  pta  Height:   Ht Readings from Last 1 Encounters:  06/27/23 6' (1.829 m)    Weight:   Wt Readings from Last 1 Encounters:  06/27/23 62.1 kg    Ideal Body Weight:  80.9 kg  BMI:  Body mass index is 18.57 kg/m.  Estimated Nutritional Needs:   Kcal:  1900-2200kcal/day  Protein:  95-110g/day  Fluid:  1.7-1.9L/day  Betsey Holiday MS, RD, LDN Please refer to [facility]  for RD and/or RD on-call/weekend/after hours pager

## 2023-06-27 NOTE — Evaluation (Signed)
Physical Therapy Evaluation Patient Details Name: Ian Medina MRN: [id number] DOB: Mar 23, 1956 Today's Date: 06/27/2023  History of Present Illness  67 y/o male presented to ED on 06/26/23 from [facility] for AMS and fever. Recently seen for UTI and discharged on antibiotics. Admitted for sepsis 2/2 UTI and urinary retention due to BPH. PMH: dementia, cerebral aneurysm, COPD, emphysema, HTN, schizoaffective disorder, schizophrenia  Clinical Impression  Patient admitted with the above. Per chart, patient is from [facility]. Patient is poor historian and unable to provide information about PLOF. Patient presents with weakness, impaired balance, impaired cognition, and decreased activity tolerance. Required supervision for bed mobility and sit to stand transfer. Able to take sidesteps at bedside with RW and min guard. On second stand, patient picking up RW and turning around with min guard. Declines further mobility. Patient will benefit from skilled PT services during acute stay to address listed deficits.        If plan is discharge home, recommend the following: Ian little help with bathing/dressing/bathroom;Ian little help with walking and/or transfers;Assistance with cooking/housework;Direct supervision/assist for medications management;Direct supervision/assist for financial management;Assist for transportation   Can travel by private vehicle        Equipment Recommendations Rolling Ian Medina (2 wheels)  Recommendations for Other Services       Functional Status Assessment Patient has had Ian recent decline in their functional status and demonstrates the ability to make significant improvements in function in Ian reasonable and predictable amount of time.     Precautions / Restrictions Precautions Precautions: Fall Restrictions Weight Bearing Restrictions: No      Mobility  Bed Mobility Overal bed mobility: Needs Assistance Bed Mobility: Supine to Sit, Sit to Supine     Supine to  sit: Supervision Sit to supine: Supervision        Transfers Overall transfer level: Needs assistance Equipment used: Rolling Ian Medina (2 wheels) Transfers: Sit to/from Stand Sit to Stand: Supervision           General transfer comment: supervision for safety. Able to stand and take sidesteps at EOB. On second stand, patient lifting RW up and turning around with RW off the ground    Ambulation/Gait               General Gait Details: patient declined mobility despite encouragement  Stairs            Wheelchair Mobility     Tilt Bed    Modified Rankin (Stroke Patients Only)       Balance Overall balance assessment: Mild deficits observed, not formally tested                                           Pertinent Vitals/Pain Pain Assessment Pain Assessment: No/denies pain    Home Living Family/patient expects to be discharged to:: Skilled nursing facility                   Additional Comments: Poor historian. Per chart, he is from [facility]    Prior Function Prior Level of Function : Needs assist             Mobility Comments: per patient report, patient uses RW but unsure of accuracy       Hand Dominance        Extremity/Trunk Assessment   Upper Extremity Assessment Upper Extremity Assessment: Defer to OT evaluation  Lower Extremity Assessment Lower Extremity Assessment: Generalized weakness    Cervical / Trunk Assessment Cervical / Trunk Assessment: Normal  Communication   Communication: Expressive difficulties (due to lack of dentures)  Cognition Arousal/Alertness: Awake/alert Behavior During Therapy: WFL for tasks assessed/performed Overall Cognitive Status: History of cognitive impairments - at baseline                                 General Comments: pleasantly confused. Able to state "hospital" and "in [place]". States "2023" and "winter" when asked year and month         General Comments      Exercises     Assessment/Plan    PT Assessment Patient needs continued PT services  PT Problem List Decreased strength;Decreased activity tolerance;Decreased balance;Decreased mobility;Decreased cognition;Decreased safety awareness;Decreased knowledge of precautions;Decreased knowledge of use of DME       PT Treatment Interventions Gait training;DME instruction;Therapeutic activities;Therapeutic exercise;Functional mobility training;Balance training;Patient/family education    PT Goals (Current goals can be found in the Care Plan section)  Acute Rehab PT Goals Patient Stated Goal: did not state PT Goal Formulation: Patient unable to participate in goal setting Time For Goal Achievement: 07/11/23 Potential to Achieve Goals: Good    Frequency Min 1X/week     Co-evaluation               AM-PAC PT "6 Clicks" Mobility  Outcome Measure Help needed turning from your back to your side while in Ian flat bed without using bedrails?: Ian Little Help needed moving from lying on your back to sitting on the side of Ian flat bed without using bedrails?: Ian Little Help needed moving to and from Ian bed to Ian chair (including Ian wheelchair)?: Ian Little Help needed standing up from Ian chair using your arms (e.g., wheelchair or bedside chair)?: Ian Little Help needed to walk in hospital room?: Ian Little Help needed climbing 3-5 steps with Ian railing? : Ian Little 6 Click Score: 18    End of Session   Activity Tolerance: Patient tolerated treatment well Patient left: in bed;with call bell/phone within reach Nurse Communication: Mobility status PT Visit Diagnosis: Unsteadiness on feet (R26.81);Muscle weakness (generalized) (M62.81);Other abnormalities of gait and mobility (R26.89)    Time: 9528-4132 PT Time Calculation (min) (ACUTE ONLY): 16 min   Charges:   PT Evaluation $PT Eval Low Complexity: 1 Low   PT General Charges $$ ACUTE PT VISIT: 1 Visit         Ian Medina, PT, DPT Physical Therapist - [facility]  [facility]   Ian Medina Ian Medina 06/27/2023, 9:17 AM

## 2023-06-27 NOTE — Evaluation (Signed)
Occupational Therapy Evaluation Patient Details Name: Ian Medina MRN: [id number] DOB: [birth year] Today's Date: 06/27/2023   History of Present Illness 67 y/o male presented to ED on 06/26/23 from [facility] for AMS and fever. Recently seen for UTI and discharged on antibiotics. Admitted for sepsis 2/2 UTI and urinary retention due to BPH. PMH: dementia, cerebral aneurysm, COPD, emphysema, HTN, schizoaffective disorder, schizophrenia   Clinical Impression   Mr Banducci was seen for OT evaluation this date. Prior to hospital admission, pt was at [facility] per chart. Pt is poor historian, reports using RW. Oriented to self and location with cues. Pt currently requires SUPERVISION for bed mobility, sit<>stand x2, and side steps/turns along EOB - assist for line mgmt. SETUP self-feeding long sitting at bed level. SBA + RW simulated BSC t/f. MOD I don B socks seated EOB. Pt eager to return to bed, RN notified pt would benefit from OOB to chair if pt agreeable. Pt would benefit from skilled OT to address noted impairments and functional limitations (see below for any additional details). Upon hospital discharge, recommend no OT follow up and return to familiar environment upon discharge.   Recommendations for follow up therapy are one component of a multi-disciplinary discharge planning process, led by the attending physician.  Recommendations may be updated based on patient status, additional functional criteria and insurance authorization.   Assistance Recommended at Discharge Intermittent Supervision/Assistance  Patient can return home with the following Assistance with cooking/housework    Functional Status Assessment  Patient has had a recent decline in their functional status and demonstrates the ability to make significant improvements in function in a reasonable and predictable amount of time.  Equipment Recommendations  None recommended by OT    Recommendations for Other Services        Precautions / Restrictions Precautions Precautions: Fall Restrictions Weight Bearing Restrictions: No      Mobility Bed Mobility Overal bed mobility: Needs Assistance Bed Mobility: Supine to Sit, Sit to Supine     Supine to sit: Supervision Sit to supine: Supervision   General bed mobility comments: assist for lines mgmt only    Transfers Overall transfer level: Needs assistance Equipment used: Rolling walker (2 wheels) Transfers: Sit to/from Stand Sit to Stand: Supervision           General transfer comment: supervision for safety. side steps along bed and pivots with poor awareness of lines and RW (picks RW up to turn)      Balance Overall balance assessment: Needs assistance Sitting-balance support: No upper extremity supported, Feet supported Sitting balance-Leahy Scale: Normal     Standing balance support: Bilateral upper extremity supported, During functional activity Standing balance-Leahy Scale: Good                             ADL either performed or assessed with clinical judgement   ADL Overall ADL's : Needs assistance/impaired                                       General ADL Comments: SETUP self-feeding long sitting at bed level. SBA + RW simulated BSC t/f. MOD I don B socks seated EOB.      Pertinent Vitals/Pain Pain Assessment Pain Assessment: No/denies pain     Hand Dominance     Extremity/Trunk Assessment Upper Extremity Assessment Upper Extremity Assessment: Overall WFL for  tasks assessed   Lower Extremity Assessment Lower Extremity Assessment: Generalized weakness   Cervical / Trunk Assessment Cervical / Trunk Assessment: Normal   Communication Communication Communication: Expressive difficulties (due to lack of dentures)   Cognition Arousal/Alertness: Awake/alert Behavior During Therapy: WFL for tasks assessed/performed Overall Cognitive Status: History of cognitive impairments - at  baseline                                 General Comments: pleasantly confused. Able to state "hospital" and "in [place]". States "2023" and "winter" when asked year and month                Home Living Family/patient expects to be discharged to:: Assisted living                                 Additional Comments: Poor historian. Per chart, he is from [employer]      Prior Functioning/Environment Prior Level of Function : Needs assist             Mobility Comments: per patient report, patient uses RW but unsure of accuracy          OT Problem List: Decreased strength;Decreased activity tolerance;Decreased safety awareness      OT Treatment/Interventions: Self-care/ADL training;Therapeutic exercise;Energy conservation;DME and/or AE instruction;Therapeutic activities;Balance training    OT Goals(Current goals can be found in the care plan section) Acute Rehab OT Goals Patient Stated Goal: to go home OT Goal Formulation: With patient Time For Goal Achievement: 07/11/23 Potential to Achieve Goals: Fair  OT Frequency: Min 1X/week    Co-evaluation PT/OT/SLP Co-Evaluation/Treatment: Yes Reason for Co-Treatment: Necessary to address cognition/behavior during functional activity PT goals addressed during session: Mobility/safety with mobility OT goals addressed during session: ADL's and self-care      AM-PAC OT "6 Clicks" Daily Activity     Outcome Measure Help from another person eating meals?: None Help from another person taking care of personal grooming?: None Help from another person toileting, which includes using toliet, bedpan, or urinal?: A Little Help from another person bathing (including washing, rinsing, drying)?: A Little Help from another person to put on and taking off regular upper body clothing?: None Help from another person to put on and taking off regular lower body clothing?: A Little 6 Click Score: 21   End  of Session Equipment Utilized During Treatment: Rolling walker (2 wheels) Nurse Communication: Mobility status  Activity Tolerance: Patient tolerated treatment well Patient left: in bed;with call bell/phone within reach  OT Visit Diagnosis: Unsteadiness on feet (R26.81);Muscle weakness (generalized) (M62.81)                Time: 1610-9604 OT Time Calculation (min): 16 min Charges:  OT General Charges $OT Visit: 1 Visit OT Evaluation $OT Eval Low Complexity: 1 Low  Kathie Dike, M.S. OTR/L  06/27/23, 9:25 AM  ascom [phone]

## 2023-06-27 NOTE — Progress Notes (Signed)
An USGPIV (ultrasound guided PIV) has been placed for short-term vasopressor infusion. A correctly placed ivWatch must be used when administering Vasopressors. Should this treatment be needed beyond 24 hours, central line access should be obtained.  It will be the responsibility of the bedside nurse to follow best practice to prevent extravasations.

## 2023-06-27 NOTE — Progress Notes (Signed)
Progress Note   Patient: Ian Medina WUJ:[id number] DOB: Aug 21, 1956 DOA: 06/26/2023     1 DOS: the patient was seen and examined on 06/27/2023   Brief hospital course: 67 y.o. male with medical history significant of  chronic prior mild intellectual disabilities. Patient is a resdient of supervised care faciility. Inspite of this, patient is reported "AAOx4 " per report receved from ER attending.  Patient had recently been diagnosed with a urinary tract infection and finished antibiotic for same.  Earlier today patient was noted to be more confused and oriented to self only.  He was unable to tell his location, what year this was.  Patient was therefore sent to [facility] ER.   ER workup as noted below, notable for lactic acidosis, leukocytosis, patient is felt to have urinary retention, urinary tract infection, possible pneumonia.  Medical evaluation is sought.   7/31.  Patient transferred to ICU for pressors and midodrine for low blood pressure.  Continue antibiotics for severe sepsis.  Assessment and Plan: * Severe sepsis (HCC) Present on admission.  Patient with acute metabolic encephalopathy, lactic acidosis, leukocytosis, fever and hypotension.  Patient required brief Levophed.  Now on midodrine.  Decrease rate of IV fluids.  Continue Rocephin.  Follow-up blood and urine cultures.  Suspected urine source.  Urinary retention due to benign prostatic hyperplasia Was diagnosed by ER attending while via bladder scan and Foley catheter was placed.  Patient started on Flomax.  Hypotension Off pressors.  On midodrine.  Acute metabolic encephalopathy Secondary to sepsis  Lactic acidosis Secondary to sepsis  Schizoaffective disorder, bipolar type (HCC) Continue Ingrezza and Zyprexa  Protein-calorie malnutrition, severe Dietitian consultation.        Subjective: Patient was transferred to the ICU overnight and required Levophed for a little while.  Started on midodrine and blood pressure  is better.  Patient feeling better today.  More talkative.  No abdominal pain.  Came in with fever and UTI and admitted with sepsis.  Physical Exam: Vitals:   06/27/23 0500 06/27/23 0715 06/27/23 0730 06/27/23 0800  BP: 100/83 96/84 116/67 112/68  Pulse: 84 78 77 83  Resp: 16 12 15 14   Temp:  98 F (36.7 C)    TempSrc:  Oral    SpO2: 100% 100% 100% 100%  Weight:      Height:       Physical Exam HENT:     Head: Normocephalic.     Mouth/Throat:     Pharynx: No oropharyngeal exudate.  Eyes:     General: Lids are normal.     Conjunctiva/sclera: Conjunctivae normal.  Cardiovascular:     Rate and Rhythm: Normal rate and regular rhythm.     Heart sounds: Normal heart sounds, S1 normal and S2 normal.  Pulmonary:     Breath sounds: No decreased breath sounds, wheezing, rhonchi or rales.  Abdominal:     Palpations: Abdomen is soft.     Tenderness: There is no abdominal tenderness.  Musculoskeletal:     Right lower leg: No swelling.     Left lower leg: No swelling.  Skin:    General: Skin is warm.     Findings: No rash.  Neurological:     Mental Status: He is alert.     Comments: Answers simple questions appropriately.  Moving all extremities.     Data Reviewed: Creatinine 1.18, potassium 3.2, lactic acid 2.7 and down to 1.5, white blood cell count 17.9, hemoglobin 9.6, platelet count 452.  Procalcitonin 0.28. Family Communication:  Updated patient's guardian on the phone.  Disposition: Status is: Inpatient Remains inpatient appropriate because: Being treated for sepsis with IV antibiotics.  Planned Discharge Destination: Home with Home Health    Time spent: 28 minutes  Author: Alford Highland, MD 06/27/2023 12:45 PM  For on call review [email or web address].

## 2023-06-27 NOTE — Assessment & Plan Note (Signed)
Secondary to sepsis. °

## 2023-06-27 NOTE — Progress Notes (Signed)
eLink Physician-Brief Progress Note Patient Name: Ian Medina DOB: 1956-10-22 MRN: [id number]   Date of Service  06/27/2023  HPI/Events of Note  Patient admitted from a nursing home with sepsis and altered mental status, possible foci of infection include pneumonia and UTI. Work up is in progress.  eICU Interventions  New Patient Evaluation.        Morgaine Kimball U Alani Lacivita 06/27/2023, 2:50 AM

## 2023-06-27 NOTE — ED Notes (Signed)
Pt to ct scan.  Pt more alert, pt talking.

## 2023-06-27 NOTE — TOC CM/SW Note (Addendum)
Cm LVM for Legal Guardian [name] [phone] to get more information about needed paperwork. Cm will try to call again later.

## 2023-06-27 NOTE — Assessment & Plan Note (Deleted)
Anemia unspecified.  Will check iron studies tomorrow to further classify.

## 2023-06-27 NOTE — Hospital Course (Addendum)
67 y.o. male with medical history significant of  chronic prior mild intellectual disabilities. Patient is a resdient of supervised care faciility. Inspite of this, patient is reported "AAOx4 " per report receved from ER attending.  Patient had recently been diagnosed with a urinary tract infection and finished antibiotic for same.  Earlier today patient was noted to be more confused and oriented to self only.  He was unable to tell his location, what year this was.  Patient was therefore sent to [facility] ER.   ER workup as noted below, notable for lactic acidosis, leukocytosis, patient is felt to have urinary retention, urinary tract infection, possible pneumonia.  Medical evaluation is sought.   7/31.  Patient transferred to ICU for pressors and midodrine for low blood pressure.  Continue antibiotics for severe sepsis. 8/1.  E. coli growing in the urine culture with resistance pattern ESBL.  Rocephin discontinued and meropenem started. 8/2.  Continue meropenem.  Patient still feeling lousy.  Not eating much. 8/3.  Continue meropenem today.  Patient still not eating much.  Calorie count ordered. 8/4.  Patient not feeling well but was able to sit up in bed today.  Still not eating very well. 8/5.  Patient orthostatic last night.  Added Florinef to be midodrine today. 8/6.  Patient's blood pressure dropped down into the 60s with sitting up.  I increase Florinef to 0.2 mg daily.  Continue midodrine.  Restart IV fluids.  Encouraged eating.  Will have to discontinue Uroxatrol. 8/7.  Blood pressure much better today, completed 6 days of meropenem.  Based on susceptibility, antibiotic switched to Bactrim for additional 4 days.  Medically stable to transfer to SNF.

## 2023-06-27 NOTE — Consult Note (Signed)
NAME:  Ian Medina, MRN:  [id number], DOB:  [birth year], LOS: 1 ADMISSION DATE:  06/26/2023, CONSULTATION DATE:  06/27/23 REFERRING MD:  Dr. Maryjean Ka, CHIEF COMPLAINT:  Fever, confusion   History of Present Illness:  67 year old male presenting to [facility] ED from SNF on 06/26/2023 for evaluation of fever and altered mental status. History provided per chart review as patient unable to provide history at this time. Staff at SNF reported patient's baseline as A&O x 4 but starting 06/26/2023 patient became confused and was only oriented to person and place.  Staff reported a fever of 101 and that the patient had had a recent urinary tract infection having completed his course of antibiotics. EMS reported temp at 100.6 upon arrival, transported without complication. ED course: Upon arrival patient awake oriented x2, but slow to respond appearing confused and tremulous.  Vitals revealed low-grade temperature and tachypnea.  Sepsis protocol initiated, with 2 L IV fluid bolus and empiric antibiotics administered.  Labs significant for lactic acidosis and leukocytosis with mild hypokalemia mildly elevated BUN and hypoalbuminemia.  UA positive for nitrites and leuks, chest x-ray suggestive of possible pneumonia.  TRH consulted for admission. Medications given: 2 L IV fluid bolus, azithromycin/cefepime/vancomycin/Flagyl Initial Vitals: 100.6, 29, 100, 159/128 and 100% on room air Significant labs: (Labs/ Imaging personally reviewed) I, Cheryll Cockayne Rust-Chester, AGACNP-BC, personally viewed and interpreted this ECG. EKG Interpretation: Date: 06/26/2023, EKG Time: 17:40, Rate: 99, Rhythm: NSR, QRS Axis: Borderline LAD, Intervals: Normal, ST/T Wave abnormalities: None but challenging due to significant artifact, Narrative Interpretation: NSR with challenging read due to significant artifact Chemistry: Na+: 145, K+: 3.4, BUN/Cr.:  36/1.19, Serum CO2/ AG: 23/8, albumin: 2.1 Hematology: WBC:19.4, Hgb: 9.3,  Lactic/ PCT: 3.6  > 3.4/ 0.18, COVID-19 & Influenza A/B: Negative UA: + Leuks, + nitrites, > 50 WBCs  CXR 06/26/2023: Chronic background parenchymal lung scarring with asymmetric right basilar groundglass airspace disease consistent with acute pneumonia CT chest/abdomen/pelvis wo contrast 06/27/2023: . Progressed consolidation and ground-glass disease in the peripheral right upper lobe compared to CT from 2023, suspicious for pneumonia, possibly superimposed on underlying chronic scarring. Imaging follow-up to resolution is recommended. Emphysema. Prominent renal collecting systems and ureters without obstructing stone. Nonspecific perinephric fat stranding, correlate for pyelonephritis. Thick-walled urinary bladder with Foley catheter. Correlate with urinalysis to exclude cystitis. Gallstones.  While in the ED awaiting inpatient admission patient's blood pressure became significantly hypotensive while on maintenance IV fluid. PCCM consulted for assistance in management and monitoring due to persistent circulatory shock in the setting of suspected sepsis secondary to suspected UTI and CAP requiring peripheral vasopressor support.  Pertinent  Medical History  COPD with emphysema Hypertension Psoriasis Schizoaffective disorder depressive type Hepatitis C GAD GERD Repeated falls Tobacco use (19 pack year history) Polysubstance abuse CVA  Significant Hospital Events: Including procedures, antibiotic start and stop dates in addition to other pertinent events   06/27/2023: Admit to inpatient with suspected sepsis secondary to UTI and CAP with PCCM consulted for peripheral vasopressor support.  Interim History / Subjective:  Patient alert and responsive still appears slightly confused without any current complaints unable to provide history.  Objective   Blood pressure (!) 77/48, pulse 72, temperature 99.1 F (37.3 C), temperature source Oral, resp. rate 20, height 6' (1.829 m), weight 62.6 kg, SpO2 97%.         Intake/Output Summary (Last 24 hours) at 06/27/2023 0131 Last data filed at 06/26/2023 2022 Gross per 24 hour  Intake 1394 ml  Output 1230 ml  Net 164 ml   Filed Weights   06/26/23 2229  Weight: 62.6 kg    Examination: General: Adult male, acutely ill, lying in bed, NAD HEENT: MM pink/moist, anicteric, atraumatic, neck supple Neuro: A&O x 2-3, able to follow commands, PERRL +3, MAE CV: s1s2 RRR, NSR on monitor, no r/m/g Pulm: Regular, non labored on RA , breath sounds diminished throughout GI: soft, rounded, non tender, bs x 4 GU: foley in place with cloudy yellow urine Skin: scattered scabbed abrasions and ecchymosis Extremities: warm/dry, pulses + 2 R/P, no edema noted  Resolved Hospital Problem list     Assessment & Plan:  Suspected Sepsis due to suspected UTI & CAP Urinary retention due to BPH Patient asymptomatic, alert and requesting water with BP: 93/39 (53) Lactic: 3.6 > 3.4, Baseline PCT: 0.18, UA: +leuks +nitrites, > 50 WBC Initial interventions/workup included: 2 L of NS/LR & Cefepime/ Vancomycin/ Metronidazole & azithromycin - Supplemental oxygen as needed, to maintain SpO2 > 90% - f/u cultures, trend lactic/ PCT - Daily CBC, monitor WBC/ fever curve - IV antibiotics: ceftriaxone & azithromycin - IVF hydration as needed: LR @ 150 mL/h - Consider vasopressors to maintain MAP< 60 OR SBP > 100 if asymptomatic: norepinephrine - continue midodrine - agree with hydrocortisone 50 mg, consider continuing Q6 if effective, f/u cortisol level - will need urology follow up  COPD PMHx: Nicotine Dependence - supplemental O2 PRN to maintain SpO2 > 90% - continue outpatient Breo Ellipta, bronchodilators PRN  Cognitive Decline with underlying Dementia Schizoaffective Disorder PMHx: GAD, polysubstance abuse - continue outpatient regimen: Keppra, cogentin, trazodone, Ingrezza, Prozac, Zyprexa - supportive care  Best Practice (right click and "Reselect all SmartList  Selections" daily)  Diet/type: dysphagia diet (see orders) DVT prophylaxis: LMWH GI prophylaxis: N/A Lines: N/A Foley:  Yes, and it is still needed Code Status:  DNR Last date of multidisciplinary goals of care discussion [per primary]  Labs   CBC: Recent Labs  Lab 06/26/23 1723 06/27/23 0015  WBC 18.4* 19.4*  NEUTROABS 13.2*  --   HGB 12.4* 9.3*  HCT 40.8 28.8*  MCV 96.7 95.4  PLT 568* 443*    Basic Metabolic Panel: Recent Labs  Lab 06/26/23 2018 06/26/23 2326  NA 145  --   K 3.4*  --   CL 114*  --   CO2 23  --   GLUCOSE 178*  --   BUN 36*  --   CREATININE 1.19 1.15  CALCIUM 7.3*  --    GFR: Estimated Creatinine Clearance: 56.7 mL/min (by C-G formula based on SCr of 1.15 mg/dL). Recent Labs  Lab 06/26/23 1723 06/26/23 1914 06/27/23 0015  WBC 18.4*  --  19.4*  LATICACIDVEN 3.6* 3.4*  --     Liver Function Tests: Recent Labs  Lab 06/26/23 2018  AST 26  ALT 30  ALKPHOS 54  BILITOT 0.6  PROT 5.8*  ALBUMIN 2.1*   No results for input(s): "LIPASE", "AMYLASE" in the last 168 hours. No results for input(s): "AMMONIA" in the last 168 hours.  ABG No results found for: "PHART", "PCO2ART", "PO2ART", "HCO3", "TCO2", "ACIDBASEDEF", "O2SAT"   Coagulation Profile: Recent Labs  Lab 06/26/23 1723  INR 1.3*    Cardiac Enzymes: No results for input(s): "CKTOTAL", "CKMB", "CKMBINDEX", "TROPONINI" in the last 168 hours.  HbA1C: No results found for: "HGBA1C"  CBG: Recent Labs  Lab 06/26/23 2315  GLUCAP 125*    Review of Systems:   Patient denies any current complaints except being thirsty.  Past  Medical History:  He,  has a past medical history of Anxiety, Atherosclerosis of aorta (HCC), Auditory hallucination, Cerebral aneurysm, nonruptured, COPD with emphysema (HCC), Drug abuse (HCC), Dysphagia, oropharyngeal phase, GAD (generalized anxiety disorder), GERD (gastroesophageal reflux disease), Hepatitis C, Hypercholesterolemia, Hypertension, Middle  cerebral artery aneurysm, Mild intellectual disabilities, Paranoia (HCC), Pneumonia (2017), Psoriasis, Psoriasis vulgaris, Repeated falls, Schizoaffective disorder, depressive type (HCC), and Schizophrenia (HCC).   Surgical History:   Past Surgical History:  Procedure Laterality Date   CHEST TUBE INSERTION     COLONOSCOPY  Nov 2011   Dr. Samuella Cota: normal, internal hemorrhoids   COLONOSCOPY  Sept 2009   small internal hemorrhoids, normal    COLONOSCOPY WITH PROPOFOL N/A 05/08/2023   Procedure: COLONOSCOPY WITH PROPOFOL;  Surgeon: Dolores Frame, MD;  Location: [facility];  Service: Gastroenterology;  Laterality: N/A;  10:00am; asa 3   ESOPHAGOGASTRODUODENOSCOPY  June 2010   Dr. Samuella Cota: normal   ESOPHAGOGASTRODUODENOSCOPY  Jan 2009   Dr. Allena Katz: linear esophagitis, mild stricture, diffuse gastritis and mild duodenitis, PATH: chronic gastritis, negative H.pylori    FOOT SURGERY     right   POLYPECTOMY  05/08/2023   Procedure: POLYPECTOMY;  Surgeon: Dolores Frame, MD;  Location: [facility];  Service: Gastroenterology;;     Social History:   reports that he has quit smoking. His smoking use included cigarettes. He has been exposed to tobacco smoke. He has never used smokeless tobacco. He reports that he does not currently use drugs. He reports that he does not drink alcohol.   Family History:  His family history includes COPD in his father. There is no history of Colon cancer.   Allergies Allergies  Allergen Reactions   Propoxyphene Rash     Home Medications  Prior to Admission medications   Medication Sig Start Date End Date Taking? Authorizing Provider  albuterol (VENTOLIN HFA) 108 (90 Base) MCG/ACT inhaler Inhale 2 puffs into the lungs every 6 (six) hours as needed for wheezing or shortness of breath. 06/07/22   Gilles Chiquito, MD  aspirin 81 MG chewable tablet Chew 81 mg by mouth daily.    [provider]  benztropine (COGENTIN) 0.5 MG tablet  Take 1 tablet (0.5 mg total) by mouth 2 (two) times daily. 11/09/22   Sunnie Nielsen, DO  bisacodyl (BISACODYL LAXATIVE) 10 MG suppository Place 10 mg rectally See admin instructions. Insert 1 suppository rectally every other night at bedtime.    [provider]  cetirizine (ZYRTEC) 10 MG tablet Take 1 tablet (10 mg total) by mouth daily. Patient not taking: Reported on 03/19/2023 06/07/22   Gilles Chiquito, MD  chlorproMAZINE (THORAZINE) 25 MG tablet Take 1 tablet (25 mg total) by mouth daily. Patient not taking: Reported on 01/05/2023 11/09/22   Sunnie Nielsen, DO  finasteride (PROSCAR) 5 MG tablet Take 5 mg by mouth daily. 12/09/22   [provider]  FLUoxetine (PROZAC) 20 MG capsule Take 1 capsule (20 mg total) by mouth daily. 06/07/22   Gilles Chiquito, MD  Fluticasone-Umeclidin-Vilant (TRELEGY ELLIPTA) 100-62.5-25 MCG/ACT AEPB Inhale 1 puff into the lungs daily.    [provider]  gabapentin (NEURONTIN) 100 MG capsule Take 100 mg by mouth 3 (three) times daily. 01/25/22   [provider]  levETIRAcetam (KEPPRA) 500 MG tablet Take 1 tablet (500 mg total) by mouth 2 (two) times daily. 10/01/22   Bethann Berkshire, MD  lovastatin (MEVACOR) 20 MG tablet Take 1 tablet (20 mg total) by mouth daily. 06/07/22  Gilles Chiquito, MD  midodrine (PROAMATINE) 10 MG tablet Take 1 tablet (10 mg total) by mouth 3 (three) times daily with meals. 11/09/22   Sunnie Nielsen, DO  montelukast (SINGULAIR) 10 MG tablet Take 1 tablet (10 mg total) by mouth at bedtime. 06/07/22   Gilles Chiquito, MD  Multiple Vitamin (MULTIVITAMIN WITH MINERALS) TABS tablet Take 1 tablet by mouth daily. 11/09/22   Sunnie Nielsen, DO  OLANZapine (ZYPREXA) 15 MG tablet Take 15 mg by mouth at bedtime.    [provider]  OLANZapine (ZYPREXA) 20 MG tablet Take 1 tablet (20 mg total) by mouth at bedtime. Patient not taking: Reported on 05/02/2023 06/07/22   Gilles Chiquito, MD  OLANZapine  (ZYPREXA) 5 MG tablet Take 5 mg by mouth in the morning.    [provider]  omeprazole (PRILOSEC) 20 MG capsule Take 20 mg by mouth daily. 12/06/22   [provider]  senna (SENOKOT) 8.6 MG TABS tablet Take 1 tablet by mouth daily.    [provider]  tamsulosin (FLOMAX) 0.4 MG CAPS capsule Take 1 capsule (0.4 mg total) by mouth every evening. Patient not taking: Reported on 03/19/2023 11/09/22   Sunnie Nielsen, DO  traZODone (DESYREL) 50 MG tablet Take 1 tablet (50 mg total) by mouth at bedtime. 06/07/22   Gilles Chiquito, MD  valbenazine [facility]) 40 MG capsule Take 40 mg by mouth daily.    [provider]     Critical care time: 60 minutes       Betsey Holiday, AGACNP-BC Acute Care Nurse Practitioner [facility]   [phone] / [phone] Please see Amion for details.

## 2023-06-27 NOTE — Progress Notes (Signed)
Patient is seen urgently after RN reports that the patient has had a hypotension as documented.  Patient offers no complaints seems to be restful, Patient is not reporting any chest pain shortness of breath still on room air.  Has not had diarrhea or any evidence of bleeding  I would understand that he is slightly more lethargic than I previously saw him, fluid boluses were started, patient was subsequently felt to be arousable and interactive.  However patient's repeat maps are still below 60.  Please note patient has had received continuous fluids since he was admitted.    Respiratory exam: Poor excursion but bilateral intravesicular Cardiovascular S1 is normal Abdomen still soft nontender Extremities warm without edema.  Patient moves all 4 extremities to direction. Patient has made about 1200 cc of urine on July 30 Foley still in situ  Assessment and plan: Patient seems to be having worsening leukocytosis on CBC, hypertension, that seems to be poorly responsive to fluid at this time.  Associated with slight mental status changes.  I am concerned patient is having septic shock, while patient's fluid boluses are infusing, I think patient should be started on norepinephrine.  I will check a repeat lactic acid troponin CBC BMP.  I will request ICU evaluation in this regard change the patient's level of care to stepdown

## 2023-06-27 NOTE — Assessment & Plan Note (Addendum)
Secondary to sepsis.  More confused today.  Will increase Zyprexa to 20 mg at night for tonight

## 2023-06-28 DIAGNOSIS — Z1612 Extended spectrum beta lactamase (ESBL) resistance: Secondary | ICD-10-CM

## 2023-06-28 DIAGNOSIS — G9341 Metabolic encephalopathy: Secondary | ICD-10-CM | POA: Diagnosis not present

## 2023-06-28 DIAGNOSIS — B9629 Other Escherichia coli [E. coli] as the cause of diseases classified elsewhere: Secondary | ICD-10-CM

## 2023-06-28 DIAGNOSIS — N401 Enlarged prostate with lower urinary tract symptoms: Secondary | ICD-10-CM | POA: Diagnosis not present

## 2023-06-28 DIAGNOSIS — D638 Anemia in other chronic diseases classified elsewhere: Secondary | ICD-10-CM | POA: Insufficient documentation

## 2023-06-28 DIAGNOSIS — N390 Urinary tract infection, site not specified: Secondary | ICD-10-CM | POA: Diagnosis not present

## 2023-06-28 DIAGNOSIS — A419 Sepsis, unspecified organism: Secondary | ICD-10-CM | POA: Diagnosis not present

## 2023-06-28 LAB — GLUCOSE, CAPILLARY
Glucose-Capillary: 93 mg/dL (ref 70–99)
Glucose-Capillary: 144 mg/dL — ABNORMAL HIGH (ref 70–99)
Glucose-Capillary: 83 mg/dL (ref 70–99)
Glucose-Capillary: 93 mg/dL (ref 70–99)

## 2023-06-28 MED ORDER — SODIUM CHLORIDE 0.9 % IV SOLN: INTRAVENOUS | Status: DC

## 2023-06-28 MED ORDER — POTASSIUM CHLORIDE CRYS ER 20 MEQ PO TBCR
40.0000 meq | EXTENDED_RELEASE_TABLET | Freq: Once | ORAL | Status: AC
  Administered 2023-06-28: 40 meq via ORAL
  Filled 2023-06-28: qty 2

## 2023-06-28 MED ORDER — AZITHROMYCIN 250 MG PO TABS
500.0000 mg | ORAL_TABLET | Freq: Every day | ORAL | Status: AC
  Administered 2023-06-28: 500 mg via ORAL
  Filled 2023-06-28: qty 2

## 2023-06-28 MED ORDER — SODIUM CHLORIDE 0.9 % IV SOLN
1.0000 g | Freq: Three times a day (TID) | INTRAVENOUS | Status: DC
  Administered 2023-06-28 – 2023-07-04 (×18): 1 g via INTRAVENOUS
  Filled 2023-06-28 (×19): qty 20

## 2023-06-28 NOTE — Assessment & Plan Note (Addendum)
Hemoglobin 9.4.  Ferritin 101.

## 2023-06-28 NOTE — NC FL2 (Signed)
MEDICAID FL2 LEVEL OF CARE FORM     IDENTIFICATION  Patient Name: Ian Medina Birthdate: [birth year] Sex: male Admission Date (Current Location): 06/26/2023  [facility] and [place] Number:  Chiropodist and Address:  [facility], [address], [place], [place] [address]      Provider Number: [id number]  Attending Physician Name and Address:  Alford Highland, MD  Relative Name and Phone Number:       Current Level of Care: Hospital Recommended Level of Care: Skilled Nursing Facility Prior Approval Number:    Date Approved/Denied:   PASRR Number: [id number]  Discharge Plan: SNF    Current Diagnoses: Patient Active Problem List   Diagnosis Date Noted   Lactic acidosis 06/27/2023   Abnormal ultrasound of liver 03/19/2023   History of hepatitis C 12/17/2022   Liver fibrosis 12/17/2022   Anemia 12/17/2022   Constipation 12/17/2022   Hypotension 10/31/2022   UTI (urinary tract infection) 10/29/2022   Aspiration pneumonia (HCC) 10/28/2022   Hypokalemia 10/28/2022   Schizoaffective disorder, bipolar type (HCC) 10/27/2022   Protein-calorie malnutrition, severe 10/26/2022   Loss of weight 10/26/2022   Acute metabolic encephalopathy 10/24/2022   GERD (gastroesophageal reflux disease) 10/24/2022   Urinary retention due to benign prostatic hyperplasia 10/24/2022   Severe sepsis (HCC) 09/25/2022   Sepsis due to pneumonia (HCC) 09/24/2022   Falls    Multiple rib fractures    Cognitive decline 06/08/2014   Dysphagia 05/11/2014   Encounter for screening colonoscopy 03/12/2013    Orientation RESPIRATION BLADDER Height & Weight     Self, Place  Normal Continent, Indwelling catheter Weight: 143 lb 8.3 oz (65.1 kg) Height:  6' (182.9 cm)  BEHAVIORAL SYMPTOMS/MOOD NEUROLOGICAL BOWEL NUTRITION STATUS   (None)  (None) Continent Diet (DYS 2)  AMBULATORY STATUS COMMUNICATION OF NEEDS Skin   Limited Assist Verbally Bruising                        Personal Care Assistance Level of Assistance  Bathing, Feeding, Dressing Bathing Assistance: Limited assistance Feeding assistance: Independent Dressing Assistance: Independent (Modified)     Functional Limitations Info  Sight, Hearing, Speech Sight Info: Adequate Hearing Info: Adequate Speech Info: Adequate    SPECIAL CARE FACTORS FREQUENCY                       Contractures Contractures Info: Not present    Additional Factors Info  Code Status, Allergies Code Status Info: DNR Allergies Info: Propoxyphene           Current Medications (06/28/2023):  This is the current hospital active medication list Current Facility-Administered Medications  Medication Dose Route Frequency Provider Last Rate Last Admin   0.9 %  sodium chloride infusion  250 mL Intravenous Continuous Rust-Chester, Cecelia Byars, NP   Held at 06/27/23 0154   acetaminophen (TYLENOL) tablet 650 mg  650 mg Oral Q6H PRN Nolberto Hanlon, MD       Or   acetaminophen (TYLENOL) suppository 650 mg  650 mg Rectal Q6H PRN Nolberto Hanlon, MD       azithromycin [facility]) tablet 500 mg  500 mg Oral QHS Tressie Ellis, RPH       benztropine (COGENTIN) tablet 0.5 mg  0.5 mg Oral BID Nolberto Hanlon, MD   0.5 mg at 06/27/23 2203   bisacodyl (DULCOLAX) suppository 10 mg  10 mg Rectal Terrilyn Saver, MD   10 mg at 06/27/23  2203   cefTRIAXone (ROCEPHIN) 2 g in sodium chloride 0.9 % 100 mL IVPB  2 g Intravenous Q24H Rust-Chester, Cecelia Byars, NP   Stopped at 06/28/23 0102   Chlorhexidine Gluconate Cloth 2 % PADS 6 each  6 each Topical Daily Alford Highland, MD   6 each at 06/27/23 2116   enoxaparin (LOVENOX) injection 40 mg  40 mg Subcutaneous Q24H Nolberto Hanlon, MD   40 mg at 06/27/23 0834   feeding supplement (ENSURE ENLIVE / ENSURE PLUS) liquid 237 mL  237 mL Oral TID BM Alford Highland, MD   237 mL at 06/27/23 2203   finasteride (PROSCAR) tablet 5 mg  5 mg Oral Daily Nolberto Hanlon, MD   5 mg at 06/27/23 0835    FLUoxetine (PROZAC) capsule 20 mg  20 mg Oral Daily Rust-Chester, Britton L, NP   20 mg at 06/27/23 0835   fluticasone furoate-vilanterol (BREO ELLIPTA) 100-25 MCG/ACT 1 puff  1 puff Inhalation Daily Nolberto Hanlon, MD   1 puff at 06/27/23 1025   levETIRAcetam (KEPPRA) tablet 500 mg  500 mg Oral BID Nolberto Hanlon, MD   500 mg at 06/27/23 2203   midodrine (PROAMATINE) tablet 10 mg  10 mg Oral TID WC Nolberto Hanlon, MD   10 mg at 06/27/23 1658   montelukast (SINGULAIR) tablet 10 mg  10 mg Oral Sherene Sires, MD   10 mg at 06/27/23 2203   multivitamin with minerals tablet 1 tablet  1 tablet Oral Daily Wieting, Richard, MD       OLANZapine zydis (ZYPREXA) disintegrating tablet 15 mg  15 mg Oral QHS Lowella Bandy, RPH   15 mg at 06/27/23 2203   OLANZapine zydis (ZYPREXA) disintegrating tablet 5 mg  5 mg Oral Daily Lowella Bandy, RPH   5 mg at 06/27/23 1025   pantoprazole (PROTONIX) EC tablet 40 mg  40 mg Oral Daily Rust-Chester, Britton L, NP   40 mg at 06/27/23 0835   polyethylene glycol (MIRALAX / GLYCOLAX) packet 17 g  17 g Oral Daily PRN Nolberto Hanlon, MD       potassium chloride SA (KLOR-CON M) CR tablet 40 mEq  40 mEq Oral Once Alford Highland, MD       pravastatin (PRAVACHOL) tablet 20 mg  20 mg Oral q1800 Nolberto Hanlon, MD   20 mg at 06/27/23 1702   sodium chloride flush (NS) 0.9 % injection 3 mL  3 mL Intravenous Q12H Nolberto Hanlon, MD   3 mL at 06/27/23 0821   tamsulosin (FLOMAX) capsule 0.4 mg  0.4 mg Oral Daily Nolberto Hanlon, MD   0.4 mg at 06/27/23 0834   traZODone (DESYREL) tablet 50 mg  50 mg Oral Sherene Sires, MD   50 mg at 06/27/23 2203   valbenazine (INGREZZA) capsule 40 mg  40 mg Oral Daily Rust-Chester, Cecelia Byars, NP   40 mg at 06/27/23 7253     Discharge Medications: Please see discharge summary for a list of discharge medications.  Relevant Imaging Results:  Relevant Lab Results:   Additional Information SS#: [id number]  Margarito Liner, LCSW

## 2023-06-28 NOTE — Progress Notes (Signed)
Mobility Specialist - Progress Note   06/28/23 1500  Mobility  Activity Refused mobility     Pt declined mobility; reporting he just doesn't feel good today + fatigue. Pt asks to return another time. Will attempt another date.    Filiberto Pinks Mobility Specialist 06/28/23, 3:37 PM

## 2023-06-28 NOTE — Plan of Care (Signed)
  Problem: Fluid Volume: Goal: Ability to maintain a balanced intake and output will improve Outcome: Progressing   Problem: Metabolic: Goal: Ability to maintain appropriate glucose levels will improve Outcome: Progressing   Problem: Skin Integrity: Goal: Risk for impaired skin integrity will decrease Outcome: Progressing   Problem: Tissue Perfusion: Goal: Adequacy of tissue perfusion will improve Outcome: Progressing   Problem: Clinical Measurements: Goal: Diagnostic test results will improve Outcome: Progressing   Problem: Clinical Measurements: Goal: Respiratory complications will improve Outcome: Progressing

## 2023-06-28 NOTE — Progress Notes (Signed)
Progress Note   Patient: Ian Medina ZOX:[id number] DOB: 1956/03/01 DOA: 06/26/2023     2 DOS: the patient was seen and examined on 06/28/2023   Brief hospital course: 67 y.o. male with medical history significant of  chronic prior mild intellectual disabilities. Patient is a resdient of supervised care faciility. Inspite of this, patient is reported "AAOx4 " per report receved from ER attending.  Patient had recently been diagnosed with a urinary tract infection and finished antibiotic for same.  Earlier today patient was noted to be more confused and oriented to self only.  He was unable to tell his location, what year this was.  Patient was therefore sent to [facility] ER.   ER workup as noted below, notable for lactic acidosis, leukocytosis, patient is felt to have urinary retention, urinary tract infection, possible pneumonia.  Medical evaluation is sought.   7/31.  Patient transferred to ICU for pressors and midodrine for low blood pressure.  Continue antibiotics for severe sepsis. 8/1.  E. coli growing in the urine culture with resistance pattern ESBL.  Rocephin discontinued and meropenem started.  Assessment and Plan: * Severe sepsis (HCC) Present on admission.  Patient with acute metabolic encephalopathy, lactic acidosis, leukocytosis, fever and hypotension.  Patient required brief Levophed.  Now on midodrine.  Decreased rate of IV fluids.  Change antibiotics to meropenum.  Urine culture growing ESBL E. coli.  Urinary retention due to benign prostatic hyperplasia Was diagnosed by ER attending while via bladder scan and Foley catheter was placed.  Patient started on Flomax.  Hypotension Off pressors.  On midodrine.  Acute metabolic encephalopathy Secondary to sepsis  Lactic acidosis Secondary to sepsis  Anemia of chronic disease Hemoglobin 8.6.  Ferritin 101.  Hypokalemia Replace potassium again today.  Schizoaffective disorder, bipolar type (HCC) Continue Ingrezza and  Zyprexa  Protein-calorie malnutrition, severe Dietitian consultation.        Subjective: Patient not feeling so well today.  Cannot elaborate.  No abdominal pain.  Admitted with sepsis from urinary source.  Physical Exam: Vitals:   06/27/23 1657 06/27/23 2122 06/28/23 0500 06/28/23 0800  BP: (!) 96/54 102/74  (!) 105/57  Pulse: 99 82  76  Resp:  18  18  Temp:  98.4 F (36.9 C)  98.4 F (36.9 C)  TempSrc:  Oral  Oral  SpO2: 99% 97%  97%  Weight:   65.1 kg   Height:       Physical Exam HENT:     Head: Normocephalic.     Mouth/Throat:     Pharynx: No oropharyngeal exudate.  Eyes:     General: Lids are normal.     Conjunctiva/sclera: Conjunctivae normal.  Cardiovascular:     Rate and Rhythm: Normal rate and regular rhythm.     Heart sounds: Normal heart sounds, S1 normal and S2 normal.  Pulmonary:     Breath sounds: No decreased breath sounds, wheezing, rhonchi or rales.  Abdominal:     Palpations: Abdomen is soft.     Tenderness: There is no abdominal tenderness.  Musculoskeletal:     Right lower leg: No swelling.     Left lower leg: No swelling.  Skin:    General: Skin is warm.     Findings: No rash.  Neurological:     Mental Status: He is alert.     Comments: Answers simple questions appropriately.  Moving all extremities.     Data Reviewed: ESBL E. coli in urine culture Potassium 3.2, creatinine 0.83, ferritin 101,  procalcitonin 0.12, white blood cell count 11.1 and hemoglobin 8.6 and platelet count 472 Family Communication: Updated guardian on the phone  Disposition: Status is: Inpatient Remains inpatient appropriate because: Changing antibiotics to meropenem with ESBL E. coli in urine culture.  Planned Discharge Destination: Long-term care    Time spent: 28 minutes  Author: Alford Highland, MD 06/28/2023 1:15 PM  For on call review [email or web address].

## 2023-06-28 NOTE — Progress Notes (Signed)
Physical Therapy Treatment Patient Details Name: Ian Medina MRN: [id number] DOB: [birth year] Today's Date: 06/28/2023   History of Present Illness 67 y/o male presented to ED on 06/26/23 from [facility] for AMS and fever. Recently seen for UTI and discharged on antibiotics. Admitted for sepsis 2/2 UTI and urinary retention due to BPH. PMH: dementia, cerebral aneurysm, COPD, emphysema, HTN, schizoaffective disorder, schizophrenia    PT Comments  Patient supine in bed on arrival and agreeable to therapy session with max encouragement. Required supervision for bed mobility and sit to stand. Ambulated with RW, however required min-modA for balance and RW management. Improved balance without use of AD. Quickly returns to supine once back in room. Discharge plan remains appropriate.     If plan is discharge home, recommend the following: A little help with bathing/dressing/bathroom;A little help with walking and/or transfers;Assistance with cooking/housework;Direct supervision/assist for medications management;Direct supervision/assist for financial management;Assist for transportation   Can travel by private vehicle        Equipment Recommendations  Rolling Yovan Leeman (2 wheels)    Recommendations for Other Services       Precautions / Restrictions Precautions Precautions: Fall Restrictions Weight Bearing Restrictions: No     Mobility  Bed Mobility Overal bed mobility: Needs Assistance Bed Mobility: Supine to Sit, Sit to Supine     Supine to sit: Supervision Sit to supine: Supervision        Transfers Overall transfer level: Needs assistance Equipment used: Rolling Prue Lingenfelter (2 wheels) Transfers: Sit to/from Stand Sit to Stand: Supervision                Ambulation/Gait Ambulation/Gait assistance: Min assist, Mod assist Gait Distance (Feet): 75 Feet Assistive device: Rolling Floreen Teegarden (2 wheels) Gait Pattern/deviations: Step-through pattern, Decreased stride length Gait  velocity: decreased     General Gait Details: poor awareness with use of RW. Picking up Rw during mobility and running into objects. Required assistance for balance and management of RW. Better balance without use of AD   Stairs             Wheelchair Mobility     Tilt Bed    Modified Rankin (Stroke Patients Only)       Balance Overall balance assessment: Needs assistance Sitting-balance support: No upper extremity supported, Feet supported Sitting balance-Leahy Scale: Normal     Standing balance support: Bilateral upper extremity supported, During functional activity Standing balance-Leahy Scale: Fair                              Cognition Arousal/Alertness: Awake/alert Behavior During Therapy: WFL for tasks assessed/performed Overall Cognitive Status: History of cognitive impairments - at baseline                                 General Comments: pleasantly confused        Exercises      General Comments        Pertinent Vitals/Pain Pain Assessment Pain Assessment: No/denies pain    Home Living                          Prior Function            PT Goals (current goals can now be found in the care plan section) Acute Rehab PT Goals Patient Stated Goal: did not state PT Goal Formulation:  Patient unable to participate in goal setting Time For Goal Achievement: 07/11/23 Potential to Achieve Goals: Good Progress towards PT goals: Progressing toward goals    Frequency    Min 1X/week      PT Plan Current plan remains appropriate    Co-evaluation              AM-PAC PT "6 Clicks" Mobility   Outcome Measure  Help needed turning from your back to your side while in a flat bed without using bedrails?: A Little Help needed moving from lying on your back to sitting on the side of a flat bed without using bedrails?: A Little Help needed moving to and from a bed to a chair (including a wheelchair)?: A  Little Help needed standing up from a chair using your arms (e.g., wheelchair or bedside chair)?: A Little Help needed to walk in hospital room?: A Little Help needed climbing 3-5 steps with a railing? : A Little 6 Click Score: 18    End of Session   Activity Tolerance: Patient tolerated treatment well Patient left: in bed;with call bell/phone within reach;with bed alarm set Nurse Communication: Mobility status PT Visit Diagnosis: Unsteadiness on feet (R26.81);Muscle weakness (generalized) (M62.81);Other abnormalities of gait and mobility (R26.89)     Time: 8657-8469 PT Time Calculation (min) (ACUTE ONLY): 13 min  Charges:    $Therapeutic Activity: 8-22 mins PT General Charges $$ ACUTE PT VISIT: 1 Visit                     Maylon Peppers, PT, DPT Physical Therapist - [facility]  [facility]    Lidya Mccalister A Seleste Tallman 06/28/2023, 12:34 PM

## 2023-06-28 NOTE — Plan of Care (Signed)
  Problem: Coping: Goal: Ability to adjust to condition or change in health will improve Outcome: Progressing   Problem: Metabolic: Goal: Ability to maintain appropriate glucose levels will improve Outcome: Progressing   Problem: Nutritional: Goal: Maintenance of adequate nutrition will improve Outcome: Progressing   Problem: Tissue Perfusion: Goal: Adequacy of tissue perfusion will improve Outcome: Progressing   Problem: Education: Goal: Knowledge of General Education information will improve Description: Including pain rating scale, medication(s)/side effects and non-pharmacologic comfort measures Outcome: Progressing   Problem: Clinical Measurements: Goal: Diagnostic test results will improve Outcome: Progressing

## 2023-06-28 NOTE — Progress Notes (Signed)
PHARMACIST - PHYSICIAN COMMUNICATION  CONCERNING: Antibiotic IV to Oral Route Change Policy  RECOMMENDATION: This patient is receiving azithromycin by the intravenous route.  Based on criteria approved by the Pharmacy and Therapeutics Committee, the antibiotic(s) is/are being converted to the equivalent oral dose form(s).  DESCRIPTION: These criteria include: Patient being treated for a respiratory tract infection, urinary tract infection, cellulitis or clostridium difficile associated diarrhea if on metronidazole The patient is not neutropenic and does not exhibit a GI malabsorption state The patient is eating (either orally or via tube) and/or has been taking other orally administered medications for a least 24 hours The patient is improving clinically and has a Tmax < 100.5  If you have questions about this conversion, please contact the Pharmacy Department   Tressie Ellis 06/28/23

## 2023-06-28 NOTE — Consult Note (Signed)
Pharmacy Antibiotic Note  Ian Medina is a 67 y.o. male with admitted on 06/26/2023 with sepsis secondary to urinary source.  Pharmacy has been consulted for meropenem dosing.  Plan:  Meropenem 1 g IV q8h  Height: 6' (182.9 cm) Weight: 65.1 kg (143 lb 8.3 oz) IBW/kg (Calculated) : 77.6  Temp (24hrs), Avg:98.5 F (36.9 C), Min:98.4 F (36.9 C), Max:98.7 F (37.1 C)  Recent Labs  Lab 06/26/23 1723 06/26/23 1914 06/26/23 2018 06/26/23 2326 06/27/23 0015 06/27/23 0331 06/27/23 0332 06/27/23 0617 06/28/23 0327  WBC 18.4*  --   --   --  19.4*  --  18.7* 17.9* 11.1*  CREATININE  --   --  1.19 1.15  --  1.18  --   --  0.83  LATICACIDVEN 3.6* 3.4*  --   --   --   --  2.7* 1.5  --     Estimated Creatinine Clearance: 81.7 mL/min (by C-G formula based on SCr of 0.83 mg/dL).    Allergies  Allergen Reactions   Propoxyphene Rash    Antimicrobials this admission: Azithromycin 7/30 >> 8/1 Cefepime 7/30 x 1 Metronidazole 7/30 x 1 Vancomycin 7/30 x 1 Ceftriaxone 7/31 >> 8/1 Meropenem 8/1 >>   Dose adjustments this admission: N/A  Microbiology results: 7/30 BCx: NGTD 7/30 UCx: ESBL E coli  7/31 MRSA PCR: (-)  Thank you for allowing pharmacy to be a part of this patient's care.  Tressie Ellis 06/28/2023 10:47 AM

## 2023-06-29 DIAGNOSIS — N401 Enlarged prostate with lower urinary tract symptoms: Secondary | ICD-10-CM | POA: Diagnosis not present

## 2023-06-29 DIAGNOSIS — I9589 Other hypotension: Secondary | ICD-10-CM | POA: Diagnosis not present

## 2023-06-29 DIAGNOSIS — A419 Sepsis, unspecified organism: Secondary | ICD-10-CM | POA: Diagnosis not present

## 2023-06-29 DIAGNOSIS — N390 Urinary tract infection, site not specified: Secondary | ICD-10-CM | POA: Diagnosis not present

## 2023-06-29 LAB — GLUCOSE, CAPILLARY
Glucose-Capillary: 99 mg/dL (ref 70–99)
Glucose-Capillary: 103 mg/dL — ABNORMAL HIGH (ref 70–99)
Glucose-Capillary: 107 mg/dL — ABNORMAL HIGH (ref 70–99)
Glucose-Capillary: 90 mg/dL (ref 70–99)

## 2023-06-29 MED ORDER — POTASSIUM CHLORIDE CRYS ER 20 MEQ PO TBCR
20.0000 meq | EXTENDED_RELEASE_TABLET | Freq: Once | ORAL | Status: AC
  Administered 2023-06-29: 20 meq via ORAL
  Filled 2023-06-29: qty 1

## 2023-06-29 MED ORDER — OLANZAPINE 10 MG PO TBDP
20.0000 mg | ORAL_TABLET | Freq: Every day | ORAL | Status: DC
  Administered 2023-06-29 – 2023-07-03 (×5): 20 mg via ORAL
  Filled 2023-06-29 (×6): qty 2

## 2023-06-29 NOTE — Progress Notes (Signed)
Mobility Specialist - Progress Note   06/29/23 1400  Orthostatic Lying   BP- Lying (!) 85/62  Orthostatic Sitting  BP- Sitting (!) 72/47  Mobility  Activity Stood at bedside;Dangled on edge of bed  Level of Assistance Minimal assist, patient does 75% or more  Assistive Device Front wheel walker  Range of Motion/Exercises Left leg;Right leg  Activity Response Tolerated well  $Mobility charge 1 Mobility     Pt lying in bed upon arrival, utilizing RA. Pt awake and alert. Agreeable to activity with encouragement. Pt completed bed mobility with modA + task initiation (anticipate pt is able to do so with less assist however). STS at bedside with minG. Denied dizziness throughout session. Unable to maintain standing for full orthostats---legs becomes shaky requiring minA to maintain balance. Pt requests to lay back down. Alarm set, needs in reach. RN aware.    Filiberto Pinks Mobility Specialist 06/29/23, 2:54 PM

## 2023-06-29 NOTE — Plan of Care (Signed)
  Problem: Coping: Goal: Ability to adjust to condition or change in health will improve Outcome: Progressing   Problem: Fluid Volume: Goal: Ability to maintain a balanced intake and output will improve Outcome: Progressing   Problem: Health Behavior/Discharge Planning: Goal: Ability to identify and utilize available resources and services will improve Outcome: Progressing   Problem: Metabolic: Goal: Ability to maintain appropriate glucose levels will improve Outcome: Progressing   Problem: Nutritional: Goal: Progress toward achieving an optimal weight will improve Outcome: Progressing

## 2023-06-29 NOTE — Progress Notes (Signed)
Occupational Therapy Treatment Patient Details Name: Ian Medina MRN: [id number] DOB: August 06, 1956 Today's Date: 06/29/2023   History of present illness 67 y/o male presented to ED on 06/26/23 from [facility] for AMS and fever. Recently seen for UTI and discharged on antibiotics. Admitted for sepsis 2/2 UTI and urinary retention due to BPH. PMH: dementia, cerebral aneurysm, COPD, emphysema, HTN, schizoaffective disorder, schizophrenia   OT comments  Pt seen for OT tx limited by pt's BP in sitting. Supine BP was 115/84 HR 102. Pt required supervision for bed mobility and assist for lines mgt only. Once seated, BP 66/38 HR 109. Pt tolerated sitting EOB <23min before promptly stating "I want to lay back down" and returned to supine. Pt denied symptoms. Further ADL/mobility deferred. MD and RN notified of BP via secure chat directly after session. Pt continues to benefit from skilled OT services.    Recommendations for follow up therapy are one component of a multi-disciplinary discharge planning process, led by the attending physician.  Recommendations may be updated based on patient status, additional functional criteria and insurance authorization.    Assistance Recommended at Discharge Frequent or constant Supervision/Assistance  Patient can return home with the following  Assistance with cooking/housework;Direct supervision/assist for medications management   Equipment Recommendations  None recommended by OT    Recommendations for Other Services      Precautions / Restrictions Precautions Precautions: Fall Precaution Comments: monitor BP Restrictions Weight Bearing Restrictions: No       Mobility Bed Mobility Overal bed mobility: Needs Assistance Bed Mobility: Supine to Sit, Sit to Supine     Supine to sit: Supervision Sit to supine: Supervision   General bed mobility comments: lines mgt assist only, returns to supine promptly without warning    Transfers                    General transfer comment: deferred 2/2 Low BP sitting     Balance Overall balance assessment: Needs assistance Sitting-balance support: No upper extremity supported, Feet supported Sitting balance-Leahy Scale: Good                                     ADL either performed or assessed with clinical judgement   ADL                                              Extremity/Trunk Assessment              Vision       Perception     Praxis      Cognition Arousal/Alertness: Awake/alert Behavior During Therapy: WFL for tasks assessed/performed Overall Cognitive Status: History of cognitive impairments - at baseline                                 General Comments: pleasantly confused, cues for simple commands, denies symptoms but returns to supine promptly without warning        Exercises      Shoulder Instructions       General Comments      Pertinent Vitals/ Pain       Pain Assessment Pain Assessment: No/denies pain  Home Living  Prior Functioning/Environment              Frequency  Min 1X/week        Progress Toward Goals  OT Goals(current goals can now be found in the care plan section)  Progress towards OT goals: Progressing toward goals  Acute Rehab OT Goals Patient Stated Goal: to go home OT Goal Formulation: With patient Time For Goal Achievement: 07/11/23 Potential to Achieve Goals: Fair  Plan Discharge plan remains appropriate;Frequency remains appropriate    Co-evaluation                 AM-PAC OT "6 Clicks" Daily Activity     Outcome Measure   Help from another person eating meals?: None Help from another person taking care of personal grooming?: None Help from another person toileting, which includes using toliet, bedpan, or urinal?: A Little Help from another person bathing (including washing, rinsing,  drying)?: A Little Help from another person to put on and taking off regular upper body clothing?: None Help from another person to put on and taking off regular lower body clothing?: A Little 6 Click Score: 21    End of Session    OT Visit Diagnosis: Unsteadiness on feet (R26.81);Muscle weakness (generalized) (M62.81)   Activity Tolerance Treatment limited secondary to medical complications (Comment)   Patient Left in bed;with call bell/phone within reach;with bed alarm set;with SCD's reapplied   Nurse Communication Mobility status (MD and RN: BP supine and sitting, SCDs on)        Time: 4098-1191 OT Time Calculation (min): 13 min  Charges: OT General Charges $OT Visit: 1 Visit OT Treatments $Therapeutic Activity: 8-22 mins  Arman Filter., MPH, MS, OTR/L ascom [phone] 06/29/23, 3:33 PM

## 2023-06-29 NOTE — Plan of Care (Signed)
  Problem: Fluid Volume: Goal: Ability to maintain a balanced intake and output will improve Outcome: Progressing   Problem: Nutritional: Goal: Maintenance of adequate nutrition will improve Outcome: Progressing   Problem: Skin Integrity: Goal: Risk for impaired skin integrity will decrease Outcome: Progressing   Problem: Clinical Measurements: Goal: Diagnostic test results will improve Outcome: Progressing

## 2023-06-29 NOTE — Progress Notes (Signed)
Progress Note   Patient: Ian Medina ONG:[id number] DOB: 08/31/1956 DOA: 06/26/2023     3 DOS: the patient was seen and examined on 06/29/2023   Brief hospital course: 67 y.o. male with medical history significant of  chronic prior mild intellectual disabilities. Patient is a resdient of supervised care faciility. Inspite of this, patient is reported "AAOx4 " per report receved from ER attending.  Patient had recently been diagnosed with a urinary tract infection and finished antibiotic for same.  Earlier today patient was noted to be more confused and oriented to self only.  He was unable to tell his location, what year this was.  Patient was therefore sent to [facility] ER.   ER workup as noted below, notable for lactic acidosis, leukocytosis, patient is felt to have urinary retention, urinary tract infection, possible pneumonia.  Medical evaluation is sought.   7/31.  Patient transferred to ICU for pressors and midodrine for low blood pressure.  Continue antibiotics for severe sepsis. 8/1.  E. coli growing in the urine culture with resistance pattern ESBL.  Rocephin discontinued and meropenem started. 8/2.  Continue meropenem.  Patient still feeling lousy.  Not eating much.  Assessment and Plan: * Severe sepsis (HCC) ESBL E. coli sepsis, present on admission.  Patient with acute metabolic encephalopathy, lactic acidosis, leukocytosis, fever and hypotension.  Patient required brief Levophed.  Now on midodrine.  Decreased rate of IV fluids.  Changed antibiotics to meropenum on 8/1.    Urinary retention due to benign prostatic hyperplasia Was diagnosed by ER attending while via bladder scan and Foley catheter was placed.  Patient started on Flomax.  Hypotension Off pressors.  On midodrine.  Acute metabolic encephalopathy Secondary to sepsis.  More confused today.  Will increase Zyprexa to 20 mg at night for tonight  Lactic acidosis Secondary to sepsis  Anemia of chronic disease Hemoglobin  9.0.  Ferritin 101.  Hypokalemia Replace potassium again today.  Schizoaffective disorder, bipolar type (HCC) Continue Ingrezza and Zyprexa  Protein-calorie malnutrition, severe Dietitian consultation.  Encouraged to eat.        Subjective: Patient feeling lousy.  Did not sleep too well last night.  Not eating much.  Admitted with sepsis with ESBL E. coli.  Patient with more confusion today.  Physical Exam: Vitals:   06/28/23 2018 06/29/23 0500 06/29/23 0553 06/29/23 0750  BP: 110/64  97/64 110/62  Pulse: 72  75 72  Resp: 16  18 18   Temp: 98.7 F (37.1 C)  99.1 F (37.3 C) 98.2 F (36.8 C)  TempSrc:   Oral Oral  SpO2: 97%  96% 97%  Weight:  62.8 kg    Height:       Physical Exam HENT:     Head: Normocephalic.     Mouth/Throat:     Pharynx: No oropharyngeal exudate.  Eyes:     General: Lids are normal.     Conjunctiva/sclera: Conjunctivae normal.  Cardiovascular:     Rate and Rhythm: Normal rate and regular rhythm.     Heart sounds: Normal heart sounds, S1 normal and S2 normal.  Pulmonary:     Breath sounds: No decreased breath sounds, wheezing, rhonchi or rales.  Abdominal:     Palpations: Abdomen is soft.     Tenderness: There is no abdominal tenderness.  Musculoskeletal:     Right lower leg: No swelling.     Left lower leg: No swelling.  Skin:    General: Skin is warm.     Findings: No  rash.  Neurological:     Mental Status: He is alert.     Comments: Answers simple questions appropriately.  Moving all extremities.     Data Reviewed: Potassium 3.2, creatinine 0.89, white blood cell count 9.4, hemoglobin 9.0, platelet count 500  Family Communication: Updated patient's guardian on the phone.  Disposition: Status is: Inpatient Remains inpatient appropriate because: Change antibiotics to IV meropenem yesterday continue for few more days to get infection under control.  Patient also needs to eat better.  Planned Discharge Destination: Skilled nursing  facility    Time spent: 28 minutes  Author: Alford Highland, MD 06/29/2023 1:01 PM  For on call review [email or web address].

## 2023-06-29 NOTE — Care Management Important Message (Signed)
Important Message  Patient Details  Name: Ian Medina MRN: [id number] Date of Birth: January 23, 1956   Medicare Important Message Given:  N/A - LOS <3 / Initial given by admissions     [name] [name] 06/29/2023, 9:03 AM

## 2023-06-30 DIAGNOSIS — E872 Acidosis, unspecified: Secondary | ICD-10-CM | POA: Diagnosis not present

## 2023-06-30 DIAGNOSIS — I9589 Other hypotension: Secondary | ICD-10-CM | POA: Diagnosis not present

## 2023-06-30 DIAGNOSIS — A419 Sepsis, unspecified organism: Secondary | ICD-10-CM | POA: Diagnosis not present

## 2023-06-30 DIAGNOSIS — G9341 Metabolic encephalopathy: Secondary | ICD-10-CM | POA: Diagnosis not present

## 2023-06-30 LAB — GLUCOSE, CAPILLARY
Glucose-Capillary: 108 mg/dL — ABNORMAL HIGH (ref 70–99)
Glucose-Capillary: 114 mg/dL — ABNORMAL HIGH (ref 70–99)
Glucose-Capillary: 102 mg/dL — ABNORMAL HIGH (ref 70–99)

## 2023-06-30 NOTE — Progress Notes (Signed)
Mobility Specialist - Progress Note    06/30/23 1225  Mobility  Activity Turned to left side;Turned to right side;Stood at bedside  Level of Assistance Moderate assist, patient does 50-74%  $Mobility charge 1 Mobility   Author responding to bed alarm upon entry. Pt out of bed standing. Pt endorses pain and pt returned to bed with needs in reach and bed alarm activated. RN notified of pt in pain.   Johnathan Hausen Mobility Specialist 06/30/23, 12:33 PM

## 2023-06-30 NOTE — Progress Notes (Signed)
Progress Note   Patient: Ian Medina [id number] DOB: Jun 27, 1956 DOA: 06/26/2023     4 DOS: the patient was seen and examined on 06/30/2023   Brief hospital course: 67 y.o. male with medical history significant of  chronic prior mild intellectual disabilities. Patient is a resdient of supervised care faciility. Inspite of this, patient is reported "AAOx4 " per report receved from ER attending.  Patient had recently been diagnosed with a urinary tract infection and finished antibiotic for same.  Earlier today patient was noted to be more confused and oriented to self only.  He was unable to tell his location, what year this was.  Patient was therefore sent to [facility] ER.   ER workup as noted below, notable for lactic acidosis, leukocytosis, patient is felt to have urinary retention, urinary tract infection, possible pneumonia.  Medical evaluation is sought.   7/31.  Patient transferred to ICU for pressors and midodrine for low blood pressure.  Continue antibiotics for severe sepsis. 8/1.  E. coli growing in the urine culture with resistance pattern ESBL.  Rocephin discontinued and meropenem started. 8/2.  Continue meropenem.  Patient still feeling lousy.  Not eating much. 8/3.  Continue meropenem today.  Patient still not eating much.  Calorie count ordered.  Assessment and Plan: * Severe sepsis (HCC) ESBL E. coli sepsis, present on admission.  Patient with acute metabolic encephalopathy, lactic acidosis, leukocytosis, fever and hypotension.  Patient required brief Levophed.  Now on midodrine.  Decreased rate of IV fluids.  Changed antibiotics to meropenum on 8/1.  Continue IV antibiotics since patient still not feeling well and not eating well.  Urinary retention due to benign prostatic hyperplasia Was diagnosed by ER attending while via bladder scan and Foley catheter was placed.  Patient started on Flomax.  Hypotension Off pressors.  On midodrine.  Acute metabolic encephalopathy Secondary  to sepsis.  Continue increased dose of Zyprexa at night.  Lactic acidosis Secondary to sepsis  Anemia of chronic disease Hemoglobin 9.0.  Ferritin 101.  Hypokalemia Replaced  Schizoaffective disorder, bipolar type (HCC) Continue Ingrezza and Zyprexa  Protein-calorie malnutrition, severe Dietitian consultation.  Encouraged to eat.  Will get calorie count since patient is not eating well.        Subjective: Patient still not feeling well.  Not eating very well.  Admitted with ESBL E. coli sepsis.  Physical Exam: Vitals:   06/29/23 1918 06/30/23 0423 06/30/23 0500 06/30/23 1011  BP: 111/74 113/69  101/64  Pulse: 81 72  66  Resp: 20 18  16   Temp: 98.1 F (36.7 C) 99.1 F (37.3 C)  98.6 F (37 C)  TempSrc:    Oral  SpO2: 100% 96%  97%  Weight:   65.2 kg   Height:       Physical Exam HENT:     Head: Normocephalic.     Mouth/Throat:     Pharynx: No oropharyngeal exudate.  Eyes:     General: Lids are normal.     Conjunctiva/sclera: Conjunctivae normal.  Cardiovascular:     Rate and Rhythm: Normal rate and regular rhythm.     Heart sounds: Normal heart sounds, S1 normal and S2 normal.  Pulmonary:     Breath sounds: No decreased breath sounds, wheezing, rhonchi or rales.  Abdominal:     Palpations: Abdomen is soft.     Tenderness: There is no abdominal tenderness.  Musculoskeletal:     Right lower leg: No swelling.     Left lower leg: No  swelling.  Skin:    General: Skin is warm.     Findings: No rash.  Neurological:     Mental Status: He is alert.     Data Reviewed: Potassium 3.2, hemoglobin 9.0, platelet count 500  Family Communication:   Disposition: Status is: Inpatient Remains inpatient appropriate because:   Planned Discharge Destination: Skilled nursing facility    Time spent: 28 minutes  Author: Alford Highland, MD 06/30/2023 3:30 PM  For on call review [email or web address].

## 2023-07-01 DIAGNOSIS — N401 Enlarged prostate with lower urinary tract symptoms: Secondary | ICD-10-CM | POA: Diagnosis not present

## 2023-07-01 DIAGNOSIS — N390 Urinary tract infection, site not specified: Secondary | ICD-10-CM | POA: Diagnosis not present

## 2023-07-01 DIAGNOSIS — A419 Sepsis, unspecified organism: Secondary | ICD-10-CM | POA: Diagnosis not present

## 2023-07-01 DIAGNOSIS — I9589 Other hypotension: Secondary | ICD-10-CM | POA: Diagnosis not present

## 2023-07-01 LAB — COMPREHENSIVE METABOLIC PANEL WITH GFR
ALT: 15 U/L (ref 0–44)
AST: 19 U/L (ref 15–41)
Albumin: 2.1 g/dL — ABNORMAL LOW (ref 3.5–5.0)
Alkaline Phosphatase: 50 U/L (ref 38–126)
Anion gap: 5 (ref 5–15)
BUN: 11 mg/dL (ref 8–23)
CO2: 26 mmol/L (ref 22–32)
Calcium: 7.7 mg/dL — ABNORMAL LOW (ref 8.9–10.3)
Chloride: 110 mmol/L (ref 98–111)
Creatinine, Ser: 0.89 mg/dL (ref 0.61–1.24)
GFR, Estimated: >60 mL/min (ref >60)
Glucose, Bld: 92 mg/dL (ref 70–99)
Potassium: 3.7 mmol/L (ref 3.5–5.1)
Sodium: 141 mmol/L (ref 135–145)
Total Bilirubin: 0.2 mg/dL — ABNORMAL LOW (ref 0.3–1.2)
Total Protein: 5.4 g/dL — ABNORMAL LOW (ref 6.5–8.1)

## 2023-07-01 LAB — CBC
HCT: 29.3 % — ABNORMAL LOW (ref 39.0–52.0)
Hemoglobin: 9.4 g/dL — ABNORMAL LOW (ref 13.0–17.0)
MCH: 30.4 pg (ref 26.0–34.0)
MCHC: 32.1 g/dL (ref 30.0–36.0)
MCV: 94.8 fL (ref 80.0–100.0)
Platelets: 485 10*3/uL — ABNORMAL HIGH (ref 150–400)
RBC: 3.09 MIL/uL — ABNORMAL LOW (ref 4.22–5.81)
RDW: 15.7 % — ABNORMAL HIGH (ref 11.5–15.5)
WBC: 9.3 10*3/uL (ref 4.0–10.5)
nRBC: 0 % (ref 0.0–0.2)

## 2023-07-01 LAB — GLUCOSE, CAPILLARY
Glucose-Capillary: 95 mg/dL (ref 70–99)
Glucose-Capillary: 101 mg/dL — ABNORMAL HIGH (ref 70–99)

## 2023-07-01 LAB — PHOSPHORUS: Phosphorus: 2.9 mg/dL (ref 2.5–4.6)

## 2023-07-01 LAB — MAGNESIUM: Magnesium: 1.7 mg/dL (ref 1.7–2.4)

## 2023-07-01 MED ORDER — MAGNESIUM SULFATE 2 GM/50ML IV SOLN
2.0000 g | Freq: Once | INTRAVENOUS | Status: AC
  Administered 2023-07-01: 2 g via INTRAVENOUS
  Filled 2023-07-01: qty 50

## 2023-07-01 NOTE — Progress Notes (Signed)
Nutrition Brief Note  RD team received a consult to initiate a calorie count. Discussed with RN, no envelope on door. RD asked RN to hang envelope on door to collect meal tickets and supplement intake.   RD team to follow-up with results tomorrow.    Kirby Crigler RD, LDN Clinical Dietitian See Loretha Stapler for contact information.

## 2023-07-01 NOTE — Plan of Care (Signed)

## 2023-07-01 NOTE — Progress Notes (Signed)
Progress Note   Patient: Ian Medina [id number] DOB: 14-Jan-1956 DOA: 06/26/2023     5 DOS: the patient was seen and examined on 07/01/2023   Brief hospital course: 67 y.o. male with medical history significant of  chronic prior mild intellectual disabilities. Patient is a resdient of supervised care faciility. Inspite of this, patient is reported "AAOx4 " per report receved from ER attending.  Patient had recently been diagnosed with a urinary tract infection and finished antibiotic for same.  Earlier today patient was noted to be more confused and oriented to self only.  He was unable to tell his location, what year this was.  Patient was therefore sent to [facility] ER.   ER workup as noted below, notable for lactic acidosis, leukocytosis, patient is felt to have urinary retention, urinary tract infection, possible pneumonia.  Medical evaluation is sought.   7/31.  Patient transferred to ICU for pressors and midodrine for low blood pressure.  Continue antibiotics for severe sepsis. 8/1.  E. coli growing in the urine culture with resistance pattern ESBL.  Rocephin discontinued and meropenem started. 8/2.  Continue meropenem.  Patient still feeling lousy.  Not eating much. 8/3.  Continue meropenem today.  Patient still not eating much.  Calorie count ordered. 8/4.  Patient not feeling well but was able to sit up in bed today.  Still not eating very well.  Assessment and Plan: * Severe sepsis (HCC) ESBL E. coli sepsis, present on admission.  Patient with acute metabolic encephalopathy, lactic acidosis, leukocytosis, fever and hypotension.  Patient required brief Levophed.  Now on midodrine.  Changed antibiotics to meropenum on 8/1.  Continue IV antibiotics today and reassess tomorrow.  Discontinue fluids today.  Urinary retention due to benign prostatic hyperplasia Was diagnosed by ER attending while via bladder scan and Foley catheter was placed.  Patient started on Flomax and Proscar.  Try to  discontinue Foley catheter at midnight  Hypotension Off pressors.  On midodrine.  Acute metabolic encephalopathy Secondary to sepsis.  Continue increased dose of Zyprexa at night.  Lactic acidosis Secondary to sepsis  Hypomagnesemia Replace IV magnesium today  Anemia of chronic disease Hemoglobin 9.4.  Ferritin 101.  Hypokalemia Replaced  Schizoaffective disorder, bipolar type (HCC) Continue Ingrezza and Zyprexa  Protein-calorie malnutrition, severe Dietitian consultation.  Encouraged to eat.  Calorie count undergoing        Subjective: Went in the room when nursing staff was entering because his bed alarm was going off.  He was sitting at the edge of the bed.  He stated that he did not feel well but did say he was feeling a little bit better than yesterday.  Still not eating very much but did drink half of an Ensure and eat a little bit.  He is asking for soda.  Physical Exam: Vitals:   07/01/23 0447 07/01/23 0500 07/01/23 0640 07/01/23 0740  BP: (!) 90/58  120/75 (!) 90/58  Pulse: 74  68 81  Resp: 18   16  Temp: 97.7 F (36.5 C)   98.1 F (36.7 C)  TempSrc: Oral   Oral  SpO2: 98%   99%  Weight:  61.8 kg    Height:       Physical Exam HENT:     Head: Normocephalic.     Mouth/Throat:     Pharynx: No oropharyngeal exudate.  Eyes:     General: Lids are normal.     Conjunctiva/sclera: Conjunctivae normal.  Cardiovascular:     Rate and  Rhythm: Normal rate and regular rhythm.     Heart sounds: Normal heart sounds, S1 normal and S2 normal.  Pulmonary:     Breath sounds: No decreased breath sounds, wheezing, rhonchi or rales.  Abdominal:     Palpations: Abdomen is soft.     Tenderness: There is no abdominal tenderness.  Musculoskeletal:     Right lower leg: No swelling.     Left lower leg: No swelling.  Skin:    General: Skin is warm.     Findings: No rash.  Neurological:     Mental Status: He is alert.     Data Reviewed: Creatinine 0.89, magnesium  1.7, hemoglobin 9.4, white blood cell count 9.3, platelet count 485  Family Communication: Spoke with patient's guardian  Disposition: Status is: Inpatient Remains inpatient appropriate because: Will continue IV meropenem.  Calorie count undergoing.  Encouraged eating.  Will try to take out Foley catheter at midnight tonight  Planned Discharge Destination: Skilled nursing facility    Time spent: 28 minutes  Author: Alford Highland, MD 07/01/2023 1:28 PM  For on call review [email or web address].

## 2023-07-01 NOTE — Assessment & Plan Note (Signed)
Replaced 

## 2023-07-01 NOTE — Plan of Care (Signed)
  Problem: Fluid Volume: Goal: Ability to maintain a balanced intake and output will improve Outcome: Progressing   Problem: Education: Goal: Ability to describe self-care measures that may prevent or decrease complications (Diabetes Survival Skills Education) will improve Outcome: Not Progressing   Problem: Nutritional: Goal: Maintenance of adequate nutrition will improve Outcome: Not Progressing   Problem: Education: Goal: Knowledge of General Education information will improve Description: Including pain rating scale, medication(s)/side effects and non-pharmacologic comfort measures Outcome: Not Progressing   Problem: Health Behavior/Discharge Planning: Goal: Ability to manage health-related needs will improve Outcome: Not Progressing

## 2023-07-02 DIAGNOSIS — N390 Urinary tract infection, site not specified: Secondary | ICD-10-CM | POA: Diagnosis not present

## 2023-07-02 DIAGNOSIS — I951 Orthostatic hypotension: Secondary | ICD-10-CM

## 2023-07-02 DIAGNOSIS — N401 Enlarged prostate with lower urinary tract symptoms: Secondary | ICD-10-CM | POA: Diagnosis not present

## 2023-07-02 DIAGNOSIS — A419 Sepsis, unspecified organism: Secondary | ICD-10-CM | POA: Diagnosis not present

## 2023-07-02 MED ORDER — ZIPRASIDONE MESYLATE 20 MG IM SOLR
10.0000 mg | Freq: Once | INTRAMUSCULAR | Status: DC
  Filled 2023-07-02: qty 20

## 2023-07-02 MED ORDER — SODIUM CHLORIDE 0.9 % IV BOLUS
500.0000 mL | Freq: Once | INTRAVENOUS | Status: AC
  Administered 2023-07-02: 500 mL via INTRAVENOUS

## 2023-07-02 MED ORDER — ALFUZOSIN HCL ER 10 MG PO TB24
10.0000 mg | ORAL_TABLET | Freq: Every day | ORAL | Status: DC
  Administered 2023-07-03: 10 mg via ORAL
  Filled 2023-07-02: qty 1

## 2023-07-02 MED ORDER — FLUDROCORTISONE ACETATE 0.1 MG PO TABS
0.1000 mg | ORAL_TABLET | Freq: Every day | ORAL | Status: DC
  Administered 2023-07-02 – 2023-07-03 (×2): 0.1 mg via ORAL
  Filled 2023-07-02 (×2): qty 1

## 2023-07-02 MED ORDER — TRAZODONE HCL 50 MG PO TABS: 50.0000 mg | ORAL_TABLET | Freq: Once | ORAL | Status: DC

## 2023-07-02 NOTE — Plan of Care (Signed)
  Problem: Activity: Goal: Risk for activity intolerance will decrease Outcome: Progressing   Problem: Coping: Goal: Level of anxiety will decrease Outcome: Progressing   Problem: Pain Managment: Goal: General experience of comfort will improve Outcome: Progressing   Problem: Skin Integrity: Goal: Risk for impaired skin integrity will decrease Outcome: Progressing   

## 2023-07-02 NOTE — Progress Notes (Signed)
Occupational Therapy Treatment Patient Details Name: Ian Medina MRN: [id number] DOB: [birth year] Today's Date: 07/02/2023   History of present illness 67 y/o male presented to ED on 06/26/23 from [facility] for AMS and fever. Recently seen for UTI and discharged on antibiotics. Admitted for sepsis 2/2 UTI and urinary retention due to BPH. PMH: dementia, cerebral aneurysm, COPD, emphysema, HTN, schizoaffective disorder, schizophrenia   OT comments  Ian Medina was seen for OT treatment on this date. Upon arrival to room pt seated on toilet - NT and RN reporting pt removed his purewick and IV with ensuing mess in room. Pt limited by baseline cognitive deficits. Pt requires SUPERVISION + verbal cues for toilet t/f and handwashing. MIN cues don/doff B socks and gown in sitting. Provided wipes and pt washed arms of visible stains. RN in at end of session to address removed IV. Pt making good progress toward goals, will continue to follow POC. Pt appears near baseline, will maintain on caseload to progress independence with functional mobility and ADLs however of cognition limits progress will sign off. No OT follow up recommended.    Recommendations for follow up therapy are one component of a multi-disciplinary discharge planning process, led by the attending physician.  Recommendations may be updated based on patient status, additional functional criteria and insurance authorization.    Assistance Recommended at Discharge Frequent or constant Supervision/Assistance  Patient can return home with the following  Assistance with cooking/housework;Direct supervision/assist for medications management   Equipment Recommendations  None recommended by OT    Recommendations for Other Services      Precautions / Restrictions Precautions Precautions: Fall Restrictions Weight Bearing Restrictions: No       Mobility Bed Mobility Overal bed mobility: Independent                   Transfers Overall transfer level: Needs assistance Equipment used: None Transfers: Sit to/from Stand Sit to Stand: Supervision                 Balance Overall balance assessment: Needs assistance Sitting-balance support: No upper extremity supported, Feet supported Sitting balance-Leahy Scale: Normal     Standing balance support: During functional activity, No upper extremity supported Standing balance-Leahy Scale: Good                             ADL either performed or assessed with clinical judgement   ADL Overall ADL's : Needs assistance/impaired                                       General ADL Comments: SUPERVISION + verbal cues for toilet t/f and handwashing. MIN cues don/doff B socks and gown in sitting.      Cognition Arousal/Alertness: Awake/alert Behavior During Therapy: WFL for tasks assessed/performed Overall Cognitive Status: History of cognitive impairments - at baseline                                           Pertinent Vitals/ Pain       Pain Assessment Pain Assessment: No/denies pain         Frequency  Min 1X/week        Progress Toward Goals  OT Goals(current goals can now be found  in the care plan section)  Progress towards OT goals: Progressing toward goals  Acute Rehab OT Goals Patient Stated Goal: to get warm OT Goal Formulation: With patient Time For Goal Achievement: 07/11/23 Potential to Achieve Goals: Fair ADL Goals Pt Will Perform Grooming: with modified independence;standing Pt Will Perform Lower Body Dressing: with modified independence;sit to/from stand Pt Will Transfer to Toilet: with modified independence;ambulating;regular height toilet  Plan Discharge plan remains appropriate;Frequency remains appropriate    Co-evaluation                 AM-PAC OT "6 Clicks" Daily Activity     Outcome Measure   Help from another person eating meals?: None Help from  another person taking care of personal grooming?: None Help from another person toileting, which includes using toliet, bedpan, or urinal?: A Little Help from another person bathing (including washing, rinsing, drying)?: A Little Help from another person to put on and taking off regular upper body clothing?: None Help from another person to put on and taking off regular lower body clothing?: A Little 6 Click Score: 21    End of Session    OT Visit Diagnosis: Unsteadiness on feet (R26.81);Muscle weakness (generalized) (M62.81)   Activity Tolerance Patient tolerated treatment well   Patient Left in bed;with call bell/phone within reach;with bed alarm set;with nursing/sitter in room   Nurse Communication Mobility status        Time: 6213-0865 OT Time Calculation (min): 9 min  Charges: OT General Charges $OT Visit: 1 Visit OT Treatments $Self Care/Home Management : 8-22 mins  Kathie Dike, M.S. OTR/L  07/02/23, 12:49 PM  ascom [phone]

## 2023-07-02 NOTE — TOC Progression Note (Signed)
Transition of Care [facility]) - Progression Note    Patient Details  Name: Ian Medina MRN: [id number] Date of Birth: [birth year]  [facility] CM/SW Contact  Margarito Liner, LCSW Phone Number: 07/02/2023, 10:01 AM  Clinical Narrative:  CSW continues to follow progress for return to [facility] when stable.     Barriers to Discharge: Continued Medical Work up  Expected Discharge Plan and Services       Living arrangements for the past 2 months: Skilled Nursing Facility                                       Social Determinants of Health (SDOH) Interventions SDOH Screenings   Tobacco Use: Medium Risk (05/08/2023)    Readmission Risk Interventions    06/27/2023   11:44 AM  Readmission Risk Prevention Plan  Transportation Screening Complete  Medication Review (RN Care Manager) Complete  PCP or Specialist appointment within 3-5 days of discharge Complete  SW Recovery Care/Counseling Consult Complete  Palliative Care Screening Not Applicable  Skilled Nursing Facility Complete

## 2023-07-02 NOTE — Progress Notes (Signed)
Physical Therapy Treatment Patient Details Name: Ian Medina MRN: [id number] DOB: 29-Aug-1956 Today's Date: 07/02/2023   History of Present Illness 67 y/o male presented to ED on 06/26/23 from [facility] for AMS and fever. Recently seen for UTI and discharged on antibiotics. Admitted for sepsis 2/2 UTI and urinary retention due to BPH. PMH: dementia, cerebral aneurysm, COPD, emphysema, HTN, schizoaffective disorder, schizophrenia    PT Comments  Patient sleeping on arrival to room but agreeable to PT. Blood pressure 65/56 while supine. Encouraged patient to sit on edge of bed to assess upright activity tolerance and blood pressure which dropped to 68/50 with dizziness reported. Further mobility deferred for safety. PT will continue to follow to maximize independence and decrease caregiver burden.    If plan is discharge home, recommend the following: A little help with bathing/dressing/bathroom;A little help with walking and/or transfers;Assistance with cooking/housework;Direct supervision/assist for medications management;Direct supervision/assist for financial management;Assist for transportation   Can travel by private vehicle        Equipment Recommendations  Rolling walker (2 wheels)    Recommendations for Other Services       Precautions / Restrictions Precautions Precautions: Fall Precaution Comments: monitor BP Restrictions Weight Bearing Restrictions: No     Mobility  Bed Mobility Overal bed mobility: Modified Independent             General bed mobility comments: increased time required. cues for task initiation    Transfers                   General transfer comment: not attempted due to blood pressure and dizziness reported with sitting upright. encourage patient to monitor for signs of dizziness with mobility transitions for fall prevention. blood pressure in sitting was 68/50.    Ambulation/Gait                   Stairs              Wheelchair Mobility     Tilt Bed    Modified Rankin (Stroke Patients Only)       Balance Overall balance assessment: Needs assistance Sitting-balance support: No upper extremity supported, Feet supported Sitting balance-Leahy Scale: Normal Sitting balance - Comments: close stand by assistance for safety due to dizzines                                    Cognition Arousal/Alertness: Awake/alert Behavior During Therapy: WFL for tasks assessed/performed Overall Cognitive Status: History of cognitive impairments - at baseline                                          Exercises      General Comments        Pertinent Vitals/Pain Pain Assessment Pain Assessment: No/denies pain    Home Living                          Prior Function            PT Goals (current goals can now be found in the care plan section) Acute Rehab PT Goals Patient Stated Goal: did not state PT Goal Formulation: Patient unable to participate in goal setting Time For Goal Achievement: 07/11/23 Potential to Achieve Goals: Good Progress towards PT goals:  Progressing toward goals    Frequency    Min 1X/week      PT Plan Current plan remains appropriate    Co-evaluation              AM-PAC PT "6 Clicks" Mobility   Outcome Measure  Help needed turning from your back to your side while in a flat bed without using bedrails?: A Little Help needed moving from lying on your back to sitting on the side of a flat bed without using bedrails?: A Little Help needed moving to and from a bed to a chair (including a wheelchair)?: A Little Help needed standing up from a chair using your arms (e.g., wheelchair or bedside chair)?: A Little Help needed to walk in hospital room?: A Little Help needed climbing 3-5 steps with a railing? : A Little 6 Click Score: 18    End of Session   Activity Tolerance: Patient limited by fatigue Patient left: in  bed;with call bell/phone within reach;with bed alarm set Nurse Communication: Mobility status PT Visit Diagnosis: Unsteadiness on feet (R26.81);Muscle weakness (generalized) (M62.81);Other abnormalities of gait and mobility (R26.89)     Time: 1350-1401 PT Time Calculation (min) (ACUTE ONLY): 11 min  Charges:    $Therapeutic Activity: 8-22 mins PT General Charges $$ ACUTE PT VISIT: 1 Visit                     Donna Bernard, PT, MPT    Ina Homes 07/02/2023, 2:26 PM

## 2023-07-02 NOTE — Progress Notes (Signed)
Calorie Count Note  48 hour calorie count ordered.  Diet: dysphagia 2 Supplements: Magic cup TID with meals, each supplement provides 290 kcal and 9 grams of protein; Ensure Enlive po TID, each supplement provides 350 kcal and 20 grams of protein.   Case discussed with RN; pt aggitated this AM- pulled IV out and IV team in attempting to replace at this time.   Spoke with pt at bedside, who was pleasant and in good spirits today. He reports his appetite is improving. Noted pt consumed only about 75% of his sausage this morning. Pt reports good tolerance of visit and likes items he receives. RD offered pt sips of Ensure throughout visit. He consumed about 50% of supplement during visit. Discussed importance of good melan and supplement intake to promote healing.   07/01/23 Breakfast: 425 kcals, 19 grams protein Lunch: 0% completion Dinner: nothing documented  Total intake: 425 kcal (22% of minimum estimated needs)  19 protein (20% of minimum estimated needs)  8/5 Breakfast: 143 kcals, 4 grams protein  Nutrition Dx: Severe Malnutrition related to chronic illness as evidenced by severe fat depletion, severe muscle depletion; ongoing  Goal: Patient will meet greater than or equal to 90% of their needs; progressing   Intervention:   -Continue 48 hour calorie count -Continue Ensure Enlive po TID, each supplement provides 350 kcal and 20 grams of protein. -Continue Magic cup TID with meals, each supplement provides 290 kcal and 9 grams of protein -Continue MVI po daily   Levada Schilling, RD, LDN, CDCES Registered Dietitian II Certified Diabetes Care and Education Specialist Please refer to [facility] for RD and/or RD on-call/weekend/after hours pager

## 2023-07-02 NOTE — Progress Notes (Signed)
Progress Note   Patient: Ian Medina ZOX:[id number] DOB: 1956/04/21 DOA: 06/26/2023     6 DOS: the patient was seen and examined on 07/02/2023   Brief hospital course: 67 y.o. male with medical history significant of  chronic prior mild intellectual disabilities. Patient is a resdient of supervised care faciility. Inspite of this, patient is reported "AAOx4 " per report receved from ER attending.  Patient had recently been diagnosed with a urinary tract infection and finished antibiotic for same.  Earlier today patient was noted to be more confused and oriented to self only.  He was unable to tell his location, what year this was.  Patient was therefore sent to [facility] ER.   ER workup as noted below, notable for lactic acidosis, leukocytosis, patient is felt to have urinary retention, urinary tract infection, possible pneumonia.  Medical evaluation is sought.   7/31.  Patient transferred to ICU for pressors and midodrine for low blood pressure.  Continue antibiotics for severe sepsis. 8/1.  E. coli growing in the urine culture with resistance pattern ESBL.  Rocephin discontinued and meropenem started. 8/2.  Continue meropenem.  Patient still feeling lousy.  Not eating much. 8/3.  Continue meropenem today.  Patient still not eating much.  Calorie count ordered. 8/4.  Patient not feeling well but was able to sit up in bed today.  Still not eating very well. 8/5.  Patient orthostatic last night.  Added Florinef to be midodrine today.  Assessment and Plan: * Severe sepsis (HCC) ESBL E. coli sepsis, present on admission.  Patient with acute metabolic encephalopathy, lactic acidosis, leukocytosis, fever and hypotension.  Patient required brief Levophed.  Now on midodrine and I added Florinef on 8/5.Marland Kitchen  Changed antibiotics to meropenum on 8/1.  Continue IV antibiotics today and reassess tomorrow.    Orthostatic hypotension Patient has been on midodrine the whole course.  Very orthostatic last night.  Add  Florinef and give a fluid bolus.  TED hose.  Continue working with physical therapy.  Urinary retention due to benign prostatic hyperplasia Was diagnosed by ER attending while via bladder scan and Foley catheter was placed.  Patient started on Flomax and Proscar.  Urinated after Foley removal.  Since patient is orthostatic will see if we can switch Flomax over to [place].  Acute metabolic encephalopathy Secondary to sepsis.  Continue increased dose of Zyprexa at night.  Lactic acidosis Secondary to sepsis  Hypomagnesemia Replaced  Anemia of chronic disease Hemoglobin 9.4.  Ferritin 101.  Hypokalemia Replaced  Schizoaffective disorder, bipolar type (HCC) Continue Ingrezza and Zyprexa  Protein-calorie malnutrition, severe Dietitian consultation.  Encouraged to eat.  Calorie count undergoing        Subjective: Patient was sleeping this morning when I saw him.  Feeling okay.  Urinated after Foley catheter removed.  Patient was orthostatic overnight.  Physical Exam: Vitals:   07/02/23 0159 07/02/23 0337 07/02/23 0409 07/02/23 0908  BP:  100/66  98/67  Pulse: 89 78  81  Resp:  17  18  Temp:  98.1 F (36.7 C)  98.1 F (36.7 C)  TempSrc:  Oral  Oral  SpO2: 97% 98%  100%  Weight:   63.4 kg   Height:       Physical Exam HENT:     Head: Normocephalic.     Mouth/Throat:     Pharynx: No oropharyngeal exudate.  Eyes:     General: Lids are normal.     Conjunctiva/sclera: Conjunctivae normal.  Cardiovascular:  Rate and Rhythm: Normal rate and regular rhythm.     Heart sounds: Normal heart sounds, S1 normal and S2 normal.  Pulmonary:     Breath sounds: No decreased breath sounds, wheezing, rhonchi or rales.  Abdominal:     Palpations: Abdomen is soft.     Tenderness: There is no abdominal tenderness.  Musculoskeletal:     Right lower leg: No swelling.     Left lower leg: No swelling.  Skin:    General: Skin is warm.     Findings: No rash.  Neurological:      Mental Status: He is alert.     Data Reviewed: Last hemoglobin 9.4  Family Communication: Left message for guardian  Disposition: Status is: Inpatient Remains inpatient appropriate because: Patient very orthostatic last night.  Will give a fluid bolus today.  Added Florinef to the midodrine.  Continue to monitor blood pressure today.  Continue IV antibiotics today.  Planned Discharge Destination: Skilled nursing facility    Time spent: 28 minutes  Author: Alford Highland, MD 07/02/2023 1:23 PM  For on call review [email or web address].

## 2023-07-02 NOTE — Plan of Care (Signed)
  Problem: Nutritional: Goal: Maintenance of adequate nutrition will improve Outcome: Not Progressing   Problem: Nutrition: Goal: Adequate nutrition will be maintained Outcome: Not Progressing    Pt alert to self, respirations even and unlabored on RA. Pt impulsive when needing restroom. Pt up ambulating with SBA. Pt with minimal intake this shift. Encouraged throughout the shift but wanted sleep. Pt eating less than 50% per meal. 2 ensures drank this shift. Pt had no complaints of pain this shift. Pt now resting in bed and has no complaints at this time

## 2023-07-03 DIAGNOSIS — F250 Schizoaffective disorder, bipolar type: Secondary | ICD-10-CM | POA: Diagnosis not present

## 2023-07-03 DIAGNOSIS — I951 Orthostatic hypotension: Secondary | ICD-10-CM | POA: Diagnosis not present

## 2023-07-03 DIAGNOSIS — G9341 Metabolic encephalopathy: Secondary | ICD-10-CM | POA: Diagnosis not present

## 2023-07-03 DIAGNOSIS — A419 Sepsis, unspecified organism: Secondary | ICD-10-CM | POA: Diagnosis not present

## 2023-07-03 DIAGNOSIS — Z515 Encounter for palliative care: Secondary | ICD-10-CM

## 2023-07-03 DIAGNOSIS — N401 Enlarged prostate with lower urinary tract symptoms: Secondary | ICD-10-CM | POA: Diagnosis not present

## 2023-07-03 MED ORDER — FLUDROCORTISONE ACETATE 0.1 MG PO TABS
0.2000 mg | ORAL_TABLET | Freq: Every day | ORAL | Status: DC
  Administered 2023-07-04: 0.2 mg via ORAL
  Filled 2023-07-03: qty 2

## 2023-07-03 MED ORDER — SODIUM CHLORIDE 0.9 % IV SOLN: INTRAVENOUS | Status: DC

## 2023-07-03 MED ORDER — FLUDROCORTISONE ACETATE 0.1 MG PO TABS
0.1000 mg | ORAL_TABLET | Freq: Once | ORAL | Status: AC
  Administered 2023-07-03: 0.1 mg via ORAL
  Filled 2023-07-03: qty 1

## 2023-07-03 MED ORDER — MEGESTROL ACETATE 400 MG/10ML PO SUSP
400.0000 mg | Freq: Every day | ORAL | Status: DC
  Administered 2023-07-03 – 2023-07-04 (×2): 400 mg via ORAL
  Filled 2023-07-03 (×2): qty 10

## 2023-07-03 MED ORDER — SODIUM CHLORIDE 0.9 % IV BOLUS
500.0000 mL | Freq: Once | INTRAVENOUS | Status: AC
  Administered 2023-07-03: 500 mL via INTRAVENOUS

## 2023-07-03 MED ORDER — SODIUM CHLORIDE 0.9 % IV BOLUS
1000.0000 mL | Freq: Once | INTRAVENOUS | Status: AC
  Administered 2023-07-03: 1000 mL via INTRAVENOUS

## 2023-07-03 NOTE — Progress Notes (Signed)
PT Cancellation Note  Patient Details Name: Ian Medina MRN: [id number] DOB: Mar 23, 1956   Cancelled Treatment:    Reason Eval/Treat Not Completed: Other (comment) (Patient continues to have orthostatic hypotension with movement. Discussed with nurse. IV fluids being added today as well as change in medication. Will hold and follow up next date as appropriate.)  Donna Bernard, PT, MPT  Ina Homes 07/03/2023, 2:09 PM

## 2023-07-03 NOTE — Consult Note (Signed)
Consultation Note Date: 07/03/2023 at 1500  Patient Name: Ian Medina  DOB: 08-Oct-1956  MRN: [id number]  Age / Sex: 67 y.o., male  PCP: Pcp, No Referring Physician: Alford Highland, MD  Reason for Consultation: Establishing goals of care  HPI/Patient Profile: 67 y.o. male  with past medical history of schizoaffective disorder, mild intellectual disability, nicotine dependence, COPD, GAD, HTN, hep C, and frequent falls admitted on 06/26/2023 with confusion at his supervised living facility.   Patient is being treated for ESBL E. coli sepsis, orthostatic hypotension, urinary retention due to BPH, acute metabolic encephalopathy, and protein calorie malnutrition.  PMT was consulted to discuss goals of care.  Of note, patient is familiar to our service if we have seen patient during previous hospitalization (December 2023).  Clinical Assessment and Goals of Care: I have reviewed medical records including EPIC notes, labs and imaging, assessed the patient and then met with patient at bedside.  Patient is alert and oriented to self.  He has no acute complaints at this time.  However, he is unable to participate in goals of care discussions or medical decision making independently.    After assessing the patient, I spoke with his legal guardian [name] over the phone to discuss diagnosis prognosis, GOC, EOL wishes, disposition and options.  I re-introduced Palliative Medicine as specialized medical care for people living with serious illness. It focuses on providing relief from the symptoms and stress of a serious illness. The goal is to improve quality of life for both the patient and the family.  Since his hospitalization in our last discussion in December, [name] endorses that patient had done very well at his living facility.  He was ambulating without assistance and eating and drinking healthy amounts  independently.  She was very pleased with his progress.  She is sad to know that he has taken a step backwards again.  We discussed patient's current illness and what it means in the larger context of patient's on-going co-morbidities.  Discussed treatment of sepsis with N/V antibiotics and hypotension with midodrine.  Reviewed that BPH medications are also being adjusted in hopes of improving orthostatic hypotension.    I attempted to elicit values and goals of care important to the patient.  [name] endorses that she wants to treat the treatable.  She wants to do what she can for Gery within reason to help him feel better and get back to his living facility.  Advance directives, concepts specific to code status, artificial feeding and hydration, and rehospitalization were considered and discussed.  DNR with full medical interventions discussed and remain at this time.   I discussed my interaction with Dream and conveyed that he has no acute complaints at this time.  Therefore, I would not recommend any adjustments to his medication or more at this time.  [name] was in agreement.    She shares her appreciation for the care that Jersen is getting.  She is on vacation [facility] [place], [place]) and unable to visit with him in person.  She  is grateful for my updates and is accepting of PMT continuing to follow.  PMT will continue to follow and support patient throughout hospitalization.  As discharge approaches, MOST form will be introduced/ideally completed prior to discharge.   Primary Decision Maker LEGAL GUARDIAN  Physical Exam Constitutional:      General: He is not in acute distress.    Appearance: He is normal weight.  HENT:     Head: Normocephalic.     Nose: Nose normal.     Mouth/Throat:     Mouth: Mucous membranes are moist.  Eyes:     Pupils: Pupils are equal, round, and reactive to light.  Cardiovascular:     Rate and Rhythm: Normal rate.  Pulmonary:     Effort: Pulmonary effort is  normal.  Abdominal:     Palpations: Abdomen is soft.  Skin:    General: Skin is warm and dry.  Neurological:     Mental Status: He is alert.     Comments: Oriented to self    Palliative Assessment/Data: 60%    Thank you for this consult. Palliative medicine will continue to follow and assist holistically.   Time Total: 75 minutes Greater than 50%  of this time was spent counseling and coordinating care related to the above assessment and plan.  Signed by: Georgiann Cocker, DNP, FNP-BC Palliative Medicine  Please contact Palliative Medicine Team phone at [phone] for questions and concerns.  For individual provider: See Loretha Stapler

## 2023-07-03 NOTE — Progress Notes (Signed)
       CROSS COVER NOTE  NAME: Ian Medina MRN: [id number] DOB : [birth year]    Concern as stated by nurse / staff   Manual BP this morning 86/44. Patient asymptomatic, but has had low continuous Bps that are trending down. All other vitals normal, except HR 55.      Pertinent findings on chart review: Per progress note: Orthostatic hypotension Patient has been on midodrine the whole course.  Very orthostatic last night.  Add Florinef and give a fluid bolus     07/03/2023    5:08 AM 07/03/2023    5:00 AM 07/02/2023    9:10 PM  Vitals with BMI  Weight  142 lbs 7 oz   BMI  19.31   Systolic 86  90  Diastolic 44  54     Assessment and  Interventions   Assessment:  Suspect orthostatic hypotension  Plan: Trial of bolus. Continue Florinef. Can consider midodrine X X

## 2023-07-03 NOTE — Progress Notes (Signed)
Progress Note   Patient: Ian Medina ZOX:[id number] DOB: 06/04/1956 DOA: 06/26/2023     7 DOS: the patient was seen and examined on 07/03/2023   Brief hospital course: 67 y.o. male with medical history significant of  chronic prior mild intellectual disabilities. Patient is a resdient of supervised care faciility. Inspite of this, patient is reported "AAOx4 " per report receved from ER attending.  Patient had recently been diagnosed with a urinary tract infection and finished antibiotic for same.  Earlier today patient was noted to be more confused and oriented to self only.  He was unable to tell his location, what year this was.  Patient was therefore sent to [facility] ER.   ER workup as noted below, notable for lactic acidosis, leukocytosis, patient is felt to have urinary retention, urinary tract infection, possible pneumonia.  Medical evaluation is sought.   7/31.  Patient transferred to ICU for pressors and midodrine for low blood pressure.  Continue antibiotics for severe sepsis. 8/1.  E. coli growing in the urine culture with resistance pattern ESBL.  Rocephin discontinued and meropenem started. 8/2.  Continue meropenem.  Patient still feeling lousy.  Not eating much. 8/3.  Continue meropenem today.  Patient still not eating much.  Calorie count ordered. 8/4.  Patient not feeling well but was able to sit up in bed today.  Still not eating very well. 8/5.  Patient orthostatic last night.  Added Florinef to be midodrine today. 8/6.  Patient's blood pressure dropped down into the 60s with sitting up.  I increase Florinef to 0.2 mg daily.  Continue midodrine.  Restart IV fluids.  Encouraged eating.  Will have to discontinue Uroxatrol.  Assessment and Plan: * Severe sepsis (HCC) ESBL E. coli sepsis, present on admission.  Patient with acute metabolic encephalopathy, lactic acidosis, leukocytosis, fever and hypotension.  Patient required brief Levophed.  Now on midodrine and I added Florinef on 8/5.Marland Kitchen   Changed antibiotics to meropenum on 8/1.  Continue IV antibiotics today and reassess tomorrow.    Orthostatic hypotension Patient has been on midodrine the whole course.  Very orthostatic 2 nights ago and last night and again this morning.  Increase Florinef to 0.2 mg daily.  TED hose.  Continue working with physical therapy.  Need to discontinue Uroxatrol.  Urinary retention due to benign prostatic hyperplasia Was diagnosed by ER attending while via bladder scan and Foley catheter was placed.  Patient started on Flomax and Proscar.  Urinated after Foley removal.  Flomax switched over to Uroxatrol which has less orthostasis.  With orthostatic hypotension need to discontinue Uroxatrol.  Higher risk of urinary retention with stopping Uroxatrol.  Acute metabolic encephalopathy Secondary to sepsis.  Continue increased dose of Zyprexa at night.  Lactic acidosis Secondary to sepsis  Hypomagnesemia Replaced  Anemia of chronic disease Hemoglobin 9.3.  Ferritin 101.  Hypokalemia Replaced  Schizoaffective disorder, bipolar type (HCC) Continue Ingrezza and Zyprexa  Protein-calorie malnutrition, severe Dietitian consultation.  Encouraged to eat.  Calorie count undergoing.  Palliative care consultation.        Subjective: Patient not feeling well while sitting up and wanted to lie back down.  I encouraged him to eat.  He was able to drink a little bit of Ensure while is in the room.  Patient orthostatic with blood pressure dropping down into the 60s while sitting.  Increase Florinef to 0.2 mg daily.  Restarted IV fluids.  Physical Exam: Vitals:   07/03/23 8119 07/03/23 1478 07/03/23 0831 07/03/23 1254  BP: (!) 86/44 (!) 88/50 (!) 95/56 94/62  Pulse:   82   Resp: 17  18   Temp: 97.7 F (36.5 C)  98.5 F (36.9 C)   TempSrc: Oral  Oral   SpO2: 100%  99%   Weight:      Height:       Physical Exam HENT:     Head: Normocephalic.     Mouth/Throat:     Pharynx: No oropharyngeal  exudate.  Eyes:     General: Lids are normal.     Conjunctiva/sclera: Conjunctivae normal.  Cardiovascular:     Rate and Rhythm: Normal rate and regular rhythm.     Heart sounds: Normal heart sounds, S1 normal and S2 normal.  Pulmonary:     Breath sounds: No decreased breath sounds, wheezing, rhonchi or rales.  Abdominal:     Palpations: Abdomen is soft.     Tenderness: There is no abdominal tenderness.  Musculoskeletal:     Right lower leg: No swelling.     Left lower leg: No swelling.  Skin:    General: Skin is warm.     Findings: No rash.  Neurological:     Mental Status: He is alert.     Data Reviewed: Creatinine 0.82, hemoglobin 9.3, platelet count 415  Family Communication: Spoke with guardian on the phone  Disposition: Status is: Inpatient Remains inpatient appropriate because: Blood pressure dropping down into the 60s with sitting up today.  Increasing Florinef to 0.2 mg daily.  Continue midodrine.  Will have to discontinue Uroxatrol.  Planned Discharge Destination: Skilled nursing facility    Time spent: 28 minutes  Author: Alford Highland, MD 07/03/2023 1:53 PM  For on call review [email or web address].

## 2023-07-03 NOTE — Plan of Care (Signed)

## 2023-07-03 NOTE — Progress Notes (Signed)
Calorie Count Day 2  Estimated Nutritional Needs:    Kcal:  1900-2200kcal/day Protein:  95-110g/day Fluid:  1.7-1.9L/day  Dinner: 0%  Total- 0  Breakfast: sips/bites  Total- 50kcal and 2g protein   Lunch: 0%  Total- 0  Supplements:  100% of Ensure Enlive, each supplement provides 350 kcal and 20 grams of protein.  Total- 1050kcal/day, 60g/day protein   Total Intake:  1100kcal (58% estimated needs) and 62g protein (65% estimated needs)  Betsey Holiday MS, RD, LDN Please refer to [facility] for RD and/or RD on-call/weekend/after hours pager

## 2023-07-03 NOTE — Progress Notes (Signed)
Nutrition Follow Up Note   DOCUMENTATION CODES:   Severe malnutrition in context of chronic illness  INTERVENTION:   Ensure Enlive po TID, each supplement provides 350 kcal and 20 grams of protein.  Magic cup TID with meals, each supplement provides 290 kcal and 9 grams of protein  MVI po daily   Pt high refeed risk; recommend monitor potassium, magnesium and phosphorus labs daily until stable  Daily weights   Consider appetite stimulant   NUTRITION DIAGNOSIS:   Severe Malnutrition related to chronic illness as evidenced by severe fat depletion, severe muscle depletion. -ongoing   GOAL:   Patient will meet greater than or equal to 90% of their needs -not met   MONITOR:   PO intake, Supplement acceptance, Labs, Weight trends, Skin, I & O's  ASSESSMENT:   67 y/o male with h/o BPH, GERD, schizoaffective disorder, hep C, liver fibrosis, COPD, anxiety, cerebral aneurysm, IVDU (in remission) and dysphagia who is admitted with UTI, PNA and sepsis.  Met with pt in room today. Pt reports that he is not feeling as well today as he did when this RD first saw him last week. Pt reports that he has no appetite. Pt reports that he is not eating much food. Forty-eight hour calorie count completed. Pt documented to be eating 0% to sips/bites of meals. Pt is drinking 100% of all of his Ensure; pt drank and entire Ensure with this RD at bedside today (prefers chocolate). Pt meeting ~50% of his estimated needs with Ensure supplements. Pt reports that he does like the [employer] and reports that he eats these sometimes. RD discussed with pt the importance of adequate nutrition needed to preserve lean muscle. RD encouraged pt to try and eat at least 25% of his meal trays and to continue drinking the Ensure. Pt's nutritional status discussed with MD. Pt would not likely be accepting of a feeding tube. Will try appetite stimulant first. Pt remains at high refeed risk. Per chart, pt appears weight  stable since admission.   Medications reviewed and include: dulcolax, lovenox, protonix, Kcl, azithromycin, ceftriaxone, LRS @40ml /hr   Labs reviewed: K 3.2(L), BUN 29(H), P 2.8 wnl, Mg 1.9 wnl Wbc- 17.9(H), Hgb 9.6(L), Hct 31.1(L) Cbgs- 99, 120, 134 x 24 hrs   NUTRITION - FOCUSED PHYSICAL EXAM:  Flowsheet Row Most Recent Value  Orbital Region Moderate depletion  Upper Arm Region Severe depletion  Thoracic and Lumbar Region Severe depletion  Buccal Region Moderate depletion  Temple Region Moderate depletion  Clavicle Bone Region Severe depletion  Clavicle and Acromion Bone Region Severe depletion  Scapular Bone Region Severe depletion  Dorsal Hand Severe depletion  Patellar Region Severe depletion  Anterior Thigh Region Severe depletion  Posterior Calf Region Severe depletion  Edema (RD Assessment) None  Hair Reviewed  Eyes Reviewed  Mouth Reviewed  Skin Reviewed  Nails Reviewed   Diet Order:   Diet Order             DIET DYS 2 Room service appropriate? Yes; Fluid consistency: Thin  Diet effective now                  EDUCATION NEEDS:   No education needs have been identified at this time  Skin:  Skin Assessment: Reviewed RN Assessment (ecchymosis)  Last BM:  8/6- type 6  Height:   Ht Readings from Last 1 Encounters:  06/27/23 6' (1.829 m)    Weight:   Wt Readings from Last 1 Encounters:  07/03/23  64.6 kg    Ideal Body Weight:  80.9 kg  BMI:  Body mass index is 19.32 kg/m.  Estimated Nutritional Needs:   Kcal:  1900-2200kcal/day  Protein:  95-110g/day  Fluid:  1.7-1.9L/day  Betsey Holiday MS, RD, LDN Please refer to [facility] for RD and/or RD on-call/weekend/after hours pager

## 2023-07-04 DIAGNOSIS — D75838 Other thrombocytosis: Secondary | ICD-10-CM | POA: Insufficient documentation

## 2023-07-04 DIAGNOSIS — R6520 Severe sepsis without septic shock: Secondary | ICD-10-CM | POA: Diagnosis not present

## 2023-07-04 DIAGNOSIS — N401 Enlarged prostate with lower urinary tract symptoms: Secondary | ICD-10-CM | POA: Diagnosis not present

## 2023-07-04 DIAGNOSIS — G9341 Metabolic encephalopathy: Secondary | ICD-10-CM | POA: Diagnosis not present

## 2023-07-04 DIAGNOSIS — A419 Sepsis, unspecified organism: Secondary | ICD-10-CM | POA: Diagnosis not present

## 2023-07-04 MED ORDER — MEGESTROL ACETATE 400 MG/10ML PO SUSP: 400.0000 mg | Freq: Every day | ORAL | Status: AC

## 2023-07-04 MED ORDER — FLUDROCORTISONE ACETATE 0.1 MG PO TABS: 0.2000 mg | ORAL_TABLET | Freq: Every day | ORAL | Status: AC

## 2023-07-04 MED ORDER — SULFAMETHOXAZOLE-TRIMETHOPRIM 800-160 MG PO TABS: 1.0000 | ORAL_TABLET | Freq: Two times a day (BID) | ORAL | 0 refills | Status: AC

## 2023-07-04 MED ORDER — SULFAMETHOXAZOLE-TRIMETHOPRIM 800-160 MG PO TABS
1.0000 | ORAL_TABLET | Freq: Two times a day (BID) | ORAL | Status: DC
  Administered 2023-07-04: 1 via ORAL
  Filled 2023-07-04 (×2): qty 1

## 2023-07-04 NOTE — Progress Notes (Addendum)
This RN attempted to give report to [employer]. Was told that someone would call me back for report.

## 2023-07-04 NOTE — TOC Transition Note (Signed)
Transition of Care [facility]) - CM/SW Discharge Note   Patient Details  Name: Ian Medina MRN: [id number] Date of Birth: Aug 15, 1956  Transition of [facility]) CM/SW Contact:  Chapman Fitch, RN Phone Number: 07/04/2023, 2:13 PM   Clinical Narrative:     Patient will DC to:  Health Care Anticipated DC date: 07/04/23  Family notified:Department Social Services [name] (caseworker) [phone].  Transport by: Wendie Simmer  Per MD patient ready for DC to . RN, , patient's guardian , and facility notified of DC. Discharge Summary sent to facility. RN given number for report. DC packet on chart. Ambulance transport requested for patient.  TOC signing off.     Barriers to Discharge: Continued Medical Work up   Patient Goals and CMS Choice CMS Medicare.gov Compare Post Acute Care list provided to:: Legal Guardian Choice offered to / list presented to :  (Legal Guardian)  Discharge Placement                         Discharge Plan and Services Additional resources added to the After Visit Summary for                                       Social Determinants of Health (SDOH) Interventions SDOH Screenings   Tobacco Use: Medium Risk (05/08/2023)     Readmission Risk Interventions    06/27/2023   11:44 AM  Readmission Risk Prevention Plan  Transportation Screening Complete  Medication Review (RN Care Manager) Complete  PCP or Specialist appointment within 3-5 days of discharge Complete  SW Recovery Care/Counseling Consult Complete  Palliative Care Screening Not Applicable  Skilled Nursing Facility Complete

## 2023-07-04 NOTE — Discharge Summary (Signed)
Physician Discharge Summary   Patient: Ian Medina MRN: [id number] DOB: [birth year]  Admit date:     06/26/2023  Discharge date: 07/04/23  Discharge Physician: Marrion Coy   PCP: Pcp, No   Recommendations at discharge:   Follow-up with PCP in nursing home in 1 week. Follow-up with palliative care for poor prognosis.  Discharge Diagnoses: Principal Problem:   Severe sepsis (HCC) Active Problems:   Orthostatic hypotension   Urinary retention due to benign prostatic hyperplasia   Acute metabolic encephalopathy   Lactic acidosis   Protein-calorie malnutrition, severe   Schizoaffective disorder, bipolar type (HCC)   Hypokalemia   Urinary tract infection due to extended-spectrum beta lactamase (ESBL) producing Escherichia coli   Anemia of chronic disease   Hypomagnesemia   Reactive thrombocytosis  Resolved Problems:   * No resolved hospital problems. *  Hospital Course: 67 y.o. male with medical history significant of  chronic prior mild intellectual disabilities. Patient is a resdient of supervised care faciility. Inspite of this, patient is reported "AAOx4 " per report receved from ER attending.  Patient had recently been diagnosed with a urinary tract infection and finished antibiotic for same.  Earlier today patient was noted to be more confused and oriented to self only.  He was unable to tell his location, what year this was.  Patient was therefore sent to [facility] ER.   ER workup as noted below, notable for lactic acidosis, leukocytosis, patient is felt to have urinary retention, urinary tract infection, possible pneumonia.  Medical evaluation is sought.   7/31.  Patient transferred to ICU for pressors and midodrine for low blood pressure.  Continue antibiotics for severe sepsis. 8/1.  E. coli growing in the urine culture with resistance pattern ESBL.  Rocephin discontinued and meropenem started. 8/2.  Continue meropenem.  Patient still feeling lousy.  Not eating much. 8/3.   Continue meropenem today.  Patient still not eating much.  Calorie count ordered. 8/4.  Patient not feeling well but was able to sit up in bed today.  Still not eating very well. 8/5.  Patient orthostatic last night.  Added Florinef to be midodrine today. 8/6.  Patient's blood pressure dropped down into the 60s with sitting up.  I increase Florinef to 0.2 mg daily.  Continue midodrine.  Restart IV fluids.  Encouraged eating.  Will have to discontinue Uroxatrol. 8/7.  Blood pressure much better today, completed 6 days of meropenem.  Based on susceptibility, antibiotic switched to Bactrim for additional 4 days.  Medically stable to transfer to SNF.  Assessment and Plan: * Severe sepsis (HCC) E. coli ESBL UTI secondary to urinary retention.  ESBL E. coli sepsis, present on admission.  Patient with acute metabolic encephalopathy, lactic acidosis, leukocytosis, fever and hypotension.  Patient required brief Levophed.  Now on midodrine and I added Florinef on 8/5.Marland Kitchen  Changed antibiotics to meropenum on 8/1.  Patient clinically improved, antibiotic switched to Bactrim based on susceptibility.  Orthostatic hypotension Patient has been on midodrine the whole course.  Very orthostatic 2 nights ago and last night and again this morning.  Increase Florinef to 0.2 mg daily.  TED hose.  Blood pressure much better today, repeated orthostatic vital signs are negative.  Will continue midodrine and Florinef.  Urinary retention due to benign prostatic hyperplasia Was diagnosed by ER attending while via bladder scan and Foley catheter was placed.  Patient started on Flomax and Proscar.  Urinated after Foley removal.  Since patient blood pressure much better, we will continue  Flomax and Proscar.  Acute metabolic encephalopathy Secondary to sepsis.  Condition improved.  Lactic acidosis Secondary to sepsis  Hypomagnesemia Replaced  Anemia of chronic disease Hemoglobin 9.3.  Ferritin  101.  Hypokalemia Replaced  Schizoaffective disorder, bipolar type (HCC) Continue Ingrezza and Zyprexa  Protein-calorie malnutrition, severe Encouraged to eat.  Calorie count undergoing.  Palliative care consultation.        Consultants: None Procedures performed: None  Disposition: Skilled nursing facility Diet recommendation:  Discharge Diet Orders (From admission, onward)     Start     Ordered   07/04/23 0000  Diet - low sodium heart healthy        07/04/23 1034           Cardiac diet DISCHARGE MEDICATION: Allergies as of 07/04/2023       Reactions   Propoxyphene Rash        Medication List     TAKE these medications    aspirin 81 MG chewable tablet Chew 81 mg by mouth daily.   benztropine 0.5 MG tablet Commonly known as: COGENTIN Take 1 tablet (0.5 mg total) by mouth 2 (two) times daily.   Bisacodyl Laxative 10 MG suppository Generic drug: bisacodyl Place 10 mg rectally See admin instructions. Insert 1 suppository rectally every other night at bedtime.   finasteride 5 MG tablet Commonly known as: PROSCAR Take 5 mg by mouth daily.   fludrocortisone 0.1 MG tablet Commonly known as: FLORINEF Take 2 tablets (0.2 mg total) by mouth daily. Start taking on: July 05, 2023   FLUoxetine 20 MG capsule Commonly known as: PROZAC Take 1 capsule (20 mg total) by mouth daily.   gabapentin 100 MG capsule Commonly known as: NEURONTIN Take 100 mg by mouth 3 (three) times daily.   levETIRAcetam 500 MG tablet Commonly known as: Keppra Take 1 tablet (500 mg total) by mouth 2 (two) times daily.   lovastatin 20 MG tablet Commonly known as: MEVACOR Take 1 tablet (20 mg total) by mouth daily.   megestrol 400 MG/10ML suspension Commonly known as: MEGACE Take 10 mLs (400 mg total) by mouth daily. Start taking on: July 05, 2023   midodrine 10 MG tablet Commonly known as: PROAMATINE Take 1 tablet (10 mg total) by mouth 3 (three) times daily with meals.    montelukast 10 MG tablet Commonly known as: SINGULAIR Take 1 tablet (10 mg total) by mouth at bedtime.   multivitamin with minerals Tabs tablet Take 1 tablet by mouth daily.   OLANZapine 15 MG tablet Commonly known as: ZYPREXA Take 15 mg by mouth at bedtime.   OLANZapine 5 MG tablet Commonly known as: ZYPREXA Take 5 mg by mouth in the morning.   senna 8.6 MG Tabs tablet Commonly known as: SENOKOT Take 1 tablet by mouth daily.   sulfamethoxazole-trimethoprim 800-160 MG tablet Commonly known as: BACTRIM DS Take 1 tablet by mouth every 12 (twelve) hours for 4 days.   traZODone 50 MG tablet Commonly known as: DESYREL Take 1 tablet (50 mg total) by mouth at bedtime.   Trelegy Ellipta 100-62.5-25 MCG/ACT Aepb Generic drug: Fluticasone-Umeclidin-Vilant Inhale 1 puff into the lungs daily.   valbenazine 40 MG capsule Commonly known as: INGREZZA Take 40 mg by mouth daily.        Contact information for after-discharge care     Destination     [facility] .   Service: Skilled Paramedic information: [address] [place] [address] [phone]  Discharge Exam: Filed Weights   07/02/23 0409 07/03/23 0500 07/04/23 0500  Weight: 63.4 kg 64.6 kg 64.5 kg   General exam: Appears calm and comfortable, appears severely malnourished. Respiratory system: Clear to auscultation. Respiratory effort normal. Cardiovascular system: S1 & S2 heard, RRR. No JVD, murmurs, rubs, gallops or clicks. No pedal edema. Gastrointestinal system: Abdomen is nondistended, soft and nontender. No organomegaly or masses felt. Normal bowel sounds heard. Central nervous system: Alert and oriented x2. No focal neurological deficits. Extremities: Symmetric 5 x 5 power. Skin: No rashes, lesions or ulcers Psychiatry: Judgement and insight appear normal. Mood & affect appropriate.    Condition at discharge: good  The results of  significant diagnostics from this hospitalization (including imaging, microbiology, ancillary and laboratory) are listed below for reference.   Imaging Studies: CT CHEST ABDOMEN PELVIS WO CONTRAST  Result Date: 06/27/2023 CLINICAL DATA:  Sepsis hypotension EXAM: CT CHEST, ABDOMEN AND PELVIS WITHOUT CONTRAST TECHNIQUE: Multidetector CT imaging of the chest, abdomen and pelvis was performed following the standard protocol without IV contrast. RADIATION DOSE REDUCTION: This exam was performed according to the departmental dose-optimization program which includes automated exposure control, adjustment of the mA and/or kV according to patient size and/or use of iterative reconstruction technique. COMPARISON:  Chest x-ray 06/26/2023, CT chest 09/24/2022 FINDINGS: CT CHEST FINDINGS Cardiovascular: Limited evaluation without intravenous contrast. Moderate aortic atherosclerosis. No aneurysm. Normal cardiac size. No pericardial effusion Mediastinum/Nodes: Midline trachea. No thyroid mass. No suspicious lymph nodes. Esophagus within normal limits Lungs/Pleura: Emphysema. No pleural effusion or pneumothorax. Peripheral reticulations and interstitial thickening in the right lung base similar compared to CT from 2023, potentially due to scarring. Progressed consolidation and ground-glass disease in the peripheral right upper lobe compared to prior. Musculoskeletal: No acute or suspicious osseous abnormality CT ABDOMEN PELVIS FINDINGS Hepatobiliary: Hypodense liver lesions probable cysts. Gallstones. No biliary dilatation. Pancreas: Unremarkable. No pancreatic ductal dilatation or surrounding inflammatory changes. Spleen: Normal in size without focal abnormality. Adrenals/Urinary Tract: Adrenal glands are within normal limits. Prominent renal collecting systems and ureters without obstructing stone. Bilateral perinephric fat stranding. Thick-walled urinary bladder with Foley catheter. Stomach/Bowel: Stomach nonenlarged. No  dilated small bowel. No acute bowel wall thickening. Negative appendix. Vascular/Lymphatic: Moderate aortic atherosclerosis. No aneurysm. Subcentimeter retroperitoneal lymph nodes Reproductive: Prostate unremarkable Other: Small fat containing inguinal hernias. No free air or free fluid. Musculoskeletal: No acute or suspicious osseous abnormality IMPRESSION: 1. Progressed consolidation and ground-glass disease in the peripheral right upper lobe compared to CT from 2023, suspicious for pneumonia, possibly superimposed on underlying chronic scarring. Imaging follow-up to resolution is recommended. 2. Emphysema. 3. Prominent renal collecting systems and ureters without obstructing stone. Nonspecific perinephric fat stranding, correlate for pyelonephritis. Thick-walled urinary bladder with Foley catheter. Correlate with urinalysis to exclude cystitis. 4. Gallstones. Aortic Atherosclerosis (ICD10-I70.0) and Emphysema (ICD10-J43.9). Electronically Signed   By: Jasmine Pang M.D.   On: 06/27/2023 01:46   DG Chest Port 1 View  Result Date: 06/26/2023 CLINICAL DATA:  Fever, sepsis, urinary tract infection EXAM: PORTABLE CHEST 1 VIEW COMPARISON:  10/26/2022, 09/24/2022 FINDINGS: Single frontal view of the chest demonstrates an unremarkable cardiac silhouette. There is bilateral interstitial prominence, greatest at the lung bases, consistent with chronic scarring. Asymmetric ground-glass density at the right lung base could reflect superimposed pneumonia or aspiration. No effusion or pneumothorax. No acute bony abnormality. IMPRESSION: 1. Chronic background parenchymal lung scarring, with asymmetric right basilar ground-glass airspace disease consistent with acute pneumonia. Electronically Signed   By: Maxwell Caul.D.  On: 06/26/2023 17:51    Microbiology: Results for orders placed or performed during the hospital encounter of 06/26/23  Blood Culture (routine x 2)     Status: None   Collection Time: 06/26/23   5:18 PM   Specimen: BLOOD  Result Value Ref Range Status   Specimen Description BLOOD BLOOD RIGHT ARM  Final   Special Requests   Final    BOTTLES DRAWN AEROBIC AND ANAEROBIC Blood Culture adequate volume   Culture   Final    NO GROWTH 5 DAYS Performed at [facility], [address] [place], [place] [address]    Report Status 07/01/2023 FINAL  Final  Resp panel by RT-PCR (RSV, Flu A&B, Covid) Anterior Nasal Swab     Status: None   Collection Time: 06/26/23  5:23 PM   Specimen: Anterior Nasal Swab  Result Value Ref Range Status   SARS Coronavirus 2 by RT PCR NEGATIVE NEGATIVE Final    Comment: (NOTE) SARS-CoV-2 target nucleic acids are NOT DETECTED.  The SARS-CoV-2 RNA is generally detectable in upper respiratory specimens during the acute phase of infection. The lowest concentration of SARS-CoV-2 viral copies this assay can detect is 138 copies/mL. A negative result does not preclude SARS-Cov-2 infection and should not be used as the sole basis for treatment or other patient management decisions. A negative result may occur with  improper specimen collection/handling, submission of specimen other than nasopharyngeal swab, presence of viral mutation(s) within the areas targeted by this assay, and inadequate number of viral copies(<138 copies/mL). A negative result must be combined with clinical observations, patient history, and epidemiological information. The expected result is Negative.  Fact Sheet for Patients:  BloggerCourse.com  Fact Sheet for Healthcare Providers:  SeriousBroker.it  This test is no t yet approved or cleared by the [place] FDA and  has been authorized for detection and/or diagnosis of SARS-CoV-2 by FDA under an Emergency Use Authorization (EUA). This EUA will remain  in effect (meaning this test can be used) for the duration of the COVID-19 declaration under Section 564(b)(1) of the Act,  21 U.S.C.section 360bbb-3(b)(1), unless the authorization is terminated  or revoked sooner.       Influenza A by PCR NEGATIVE NEGATIVE Final   Influenza B by PCR NEGATIVE NEGATIVE Final    Comment: (NOTE) The Xpert Xpress SARS-CoV-2/FLU/RSV plus assay is intended as an aid in the diagnosis of influenza from Nasopharyngeal swab specimens and should not be used as a sole basis for treatment. Nasal washings and aspirates are unacceptable for Xpert Xpress SARS-CoV-2/FLU/RSV testing.  Fact Sheet for Patients: BloggerCourse.com  Fact Sheet for Healthcare Providers: SeriousBroker.it  This test is not yet approved or cleared by the [place] FDA and has been authorized for detection and/or diagnosis of SARS-CoV-2 by FDA under an Emergency Use Authorization (EUA). This EUA will remain in effect (meaning this test can be used) for the duration of the COVID-19 declaration under Section 564(b)(1) of the Act, 21 U.S.C. section 360bbb-3(b)(1), unless the authorization is terminated or revoked.     Resp Syncytial Virus by PCR NEGATIVE NEGATIVE Final    Comment: (NOTE) Fact Sheet for Patients: BloggerCourse.com  Fact Sheet for Healthcare Providers: SeriousBroker.it  This test is not yet approved or cleared by the [place] FDA and has been authorized for detection and/or diagnosis of SARS-CoV-2 by FDA under an Emergency Use Authorization (EUA). This EUA will remain in effect (meaning this test can be used) for the duration of the COVID-19  declaration under Section 564(b)(1) of the Act, 21 U.S.C. section 360bbb-3(b)(1), unless the authorization is terminated or revoked.  Performed at [facility], [address] [place], [place] [address]   Blood Culture (routine x 2)     Status: None   Collection Time: 06/26/23  5:23 PM   Specimen: BLOOD  Result Value Ref Range  Status   Specimen Description BLOOD BLOOD RIGHT HAND  Final   Special Requests   Final    BOTTLES DRAWN AEROBIC AND ANAEROBIC Blood Culture adequate volume   Culture   Final    NO GROWTH 5 DAYS Performed at [facility], [address], [place], [place] [address]    Report Status 07/01/2023 FINAL  Final  Urine Culture     Status: Abnormal   Collection Time: 06/26/23  5:23 PM   Specimen: Urine, Random  Result Value Ref Range Status   Specimen Description   Final    URINE, RANDOM Performed at [facility], [address] [place], [place] [address]    Special Requests   Final    NONE Reflexed from [phone] Performed at [facility], [address] [place], [place] [address]    Culture (A)  Final    >=100,000 COLONIES/mL ESCHERICHIA COLI Confirmed Extended Spectrum Beta-Lactamase Producer (ESBL).  In bloodstream infections from ESBL organisms, carbapenems are preferred over piperacillin/tazobactam. They are shown to have a lower risk of mortality.    Report Status 06/28/2023 FINAL  Final   Organism ID, Bacteria ESCHERICHIA COLI (A)  Final      Susceptibility   Escherichia coli - MIC*    AMPICILLIN >=32 RESISTANT Resistant     CEFAZOLIN >=64 RESISTANT Resistant     CEFEPIME >=32 RESISTANT Resistant     CEFTRIAXONE >=64 RESISTANT Resistant     CIPROFLOXACIN >=4 RESISTANT Resistant     GENTAMICIN <=1 SENSITIVE Sensitive     IMIPENEM <=0.25 SENSITIVE Sensitive     NITROFURANTOIN <=16 SENSITIVE Sensitive     TRIMETH/SULFA <=20 SENSITIVE Sensitive     AMPICILLIN/SULBACTAM >=32 RESISTANT Resistant     PIP/TAZO 8 SENSITIVE Sensitive     * >=100,000 COLONIES/mL ESCHERICHIA COLI  MRSA Next Gen by PCR, Nasal     Status: None   Collection Time: 06/27/23  3:40 AM   Specimen: Nasal Mucosa; Nasal Swab  Result Value Ref Range Status   MRSA by PCR Next Gen NOT DETECTED NOT DETECTED Final    Comment: (NOTE) The GeneXpert MRSA Assay (FDA approved for NASAL  specimens only), is one component of a comprehensive MRSA colonization surveillance program. It is not intended to diagnose MRSA infection nor to guide or monitor treatment for MRSA infections. Test performance is not FDA approved in patients less than 49 years old. Performed at [facility], [address] [place], [place] [address]     Labs: CBC: Recent Labs  Lab 06/28/23 0327 06/29/23 0331 07/01/23 0447 07/03/23 0406  WBC 11.1* 9.4 9.3 8.1  HGB 8.6* 9.0* 9.4* 9.3*  HCT 26.4* 27.8* 29.3* 29.9*  MCV 92.6 93.0 94.8 96.5  PLT 472* 500* 485* 415*   Basic Metabolic Panel: Recent Labs  Lab 06/28/23 0327 06/29/23 0331 07/01/23 0447 07/03/23 0406  NA 140 143 141 137  K 3.2* 3.2* 3.7 3.8  CL 110 110 110 105  CO2 23 27 26 27   GLUCOSE 91 85 92 98  BUN 22 17 11 13   CREATININE 0.83 0.89 0.89 0.82  CALCIUM 7.3* 7.5* 7.7*  7.8*  MG 1.8  --  1.7  --   PHOS 3.0  --  2.9  --    Liver Function Tests: Recent Labs  Lab 07/01/23 0447  AST 19  ALT 15  ALKPHOS 50  BILITOT 0.2*  PROT 5.4*  ALBUMIN 2.1*   CBG: Recent Labs  Lab 06/30/23 0809 06/30/23 1527 06/30/23 2015 07/01/23 0917 07/01/23 1218  GLUCAP 108* 114* 102* 95 101*    Discharge time spent: greater than 30 minutes.  Signed: Marrion Coy, MD [facility] 07/04/2023

## 2023-07-04 NOTE — Progress Notes (Signed)
PT Cancellation Note  Patient Details Name: Ian Medina MRN: [id number] DOB: [birth year]   Cancelled Treatment:     PT attempt. Pt refused however agreed to let author check BP. Unable to stay awake to participate. Current BP 77/55 (59). Acute PT will continue to follow and progress as able per current POC.    [name] 07/04/2023, 10:34 AM

## 2023-07-04 NOTE — Care Management Important Message (Signed)
Important Message  Patient Details  Name: Ian Medina MRN: [id number] Date of Birth: Mar 04, 1956   Medicare Important Message Given:  Yes  Reviewed Medicare IM with [name], legal guardian, at [phone]. Declined copy at this time, aware of appeal right.   Johnell Comings 07/04/2023, 11:09 AM

## 2023-07-04 NOTE — Plan of Care (Signed)
  Problem: Education: Goal: Ability to describe self-care measures that may prevent or decrease complications (Diabetes Survival Skills Education) will improve Outcome: Progressing Goal: Individualized Educational Video(s) Outcome: Progressing   Problem: Coping: Goal: Ability to adjust to condition or change in health will improve Outcome: Progressing   Problem: Fluid Volume: Goal: Ability to maintain a balanced intake and output will improve Outcome: Progressing   Problem: Health Behavior/Discharge Planning: Goal: Ability to identify and utilize available resources and services will improve Outcome: Progressing Goal: Ability to manage health-related needs will improve Outcome: Progressing   Problem: Metabolic: Goal: Ability to maintain appropriate glucose levels will improve Outcome: Progressing   Problem: Skin Integrity: Goal: Risk for impaired skin integrity will decrease Outcome: Progressing   Problem: Tissue Perfusion: Goal: Adequacy of tissue perfusion will improve Outcome: Progressing   Problem: Education: Goal: Knowledge of General Education information will improve Description: Including pain rating scale, medication(s)/side effects and non-pharmacologic comfort measures Outcome: Progressing   Problem: Health Behavior/Discharge Planning: Goal: Ability to manage health-related needs will improve Outcome: Progressing   Problem: Clinical Measurements: Goal: Ability to maintain clinical measurements within normal limits will improve Outcome: Progressing Goal: Will remain free from infection Outcome: Progressing Goal: Diagnostic test results will improve Outcome: Progressing Goal: Respiratory complications will improve Outcome: Progressing Goal: Cardiovascular complication will be avoided Outcome: Progressing   Problem: Activity: Goal: Risk for activity intolerance will decrease Outcome: Progressing   Problem: Elimination: Goal: Will not experience  complications related to bowel motility Outcome: Progressing Goal: Will not experience complications related to urinary retention Outcome: Progressing   Problem: Pain Managment: Goal: General experience of comfort will improve Outcome: Progressing   Problem: Safety: Goal: Ability to remain free from injury will improve Outcome: Progressing   Problem: Skin Integrity: Goal: Risk for impaired skin integrity will decrease Outcome: Progressing

## 2023-07-04 NOTE — Progress Notes (Signed)
Report given to [employer] at [facility]

## 2023-07-04 NOTE — Plan of Care (Signed)
  Problem: Coping: Goal: Ability to adjust to condition or change in health will improve Outcome: Progressing   Problem: Skin Integrity: Goal: Risk for impaired skin integrity will decrease Outcome: Progressing   

## 2023-07-10 ENCOUNTER — Ambulatory Visit (INDEPENDENT_AMBULATORY_CARE_PROVIDER_SITE_OTHER): Payer: Medicaid Other | Admitting: Gastroenterology

## 2023-08-10 ENCOUNTER — Inpatient Hospital Stay
Admission: EM | Admit: 2023-08-10 | Discharge: 2023-08-13 | DRG: 871 | Disposition: A | Discharge status: Skilled Nursing Facility | Payer: 59 | Source: Skilled Nursing Facility | Attending: Student | Admitting: Student

## 2023-08-10 ENCOUNTER — Emergency Department: Payer: 59

## 2023-08-10 ENCOUNTER — Other Ambulatory Visit: Payer: Self-pay

## 2023-08-10 DIAGNOSIS — B9620 Unspecified Escherichia coli [E. coli] as the cause of diseases classified elsewhere: Secondary | ICD-10-CM | POA: Diagnosis present

## 2023-08-10 DIAGNOSIS — R4689 Other symptoms and signs involving appearance and behavior: Secondary | ICD-10-CM | POA: Diagnosis present

## 2023-08-10 DIAGNOSIS — K219 Gastro-esophageal reflux disease without esophagitis: Secondary | ICD-10-CM | POA: Diagnosis present

## 2023-08-10 DIAGNOSIS — Z79899 Other long term (current) drug therapy: Secondary | ICD-10-CM

## 2023-08-10 DIAGNOSIS — G9341 Metabolic encephalopathy: Secondary | ICD-10-CM | POA: Diagnosis present

## 2023-08-10 DIAGNOSIS — Z7982 Long term (current) use of aspirin: Secondary | ICD-10-CM

## 2023-08-10 DIAGNOSIS — Z1612 Extended spectrum beta lactamase (ESBL) resistance: Secondary | ICD-10-CM | POA: Diagnosis present

## 2023-08-10 DIAGNOSIS — Z885 Allergy status to narcotic agent status: Secondary | ICD-10-CM

## 2023-08-10 DIAGNOSIS — W010XXA Fall on same level from slipping, tripping and stumbling without subsequent striking against object, initial encounter: Secondary | ICD-10-CM | POA: Diagnosis present

## 2023-08-10 DIAGNOSIS — F411 Generalized anxiety disorder: Secondary | ICD-10-CM | POA: Diagnosis present

## 2023-08-10 DIAGNOSIS — Z825 Family history of asthma and other chronic lower respiratory diseases: Secondary | ICD-10-CM

## 2023-08-10 DIAGNOSIS — E861 Hypovolemia: Secondary | ICD-10-CM | POA: Diagnosis present

## 2023-08-10 DIAGNOSIS — N390 Urinary tract infection, site not specified: Secondary | ICD-10-CM | POA: Diagnosis present

## 2023-08-10 DIAGNOSIS — I9589 Other hypotension: Secondary | ICD-10-CM | POA: Diagnosis present

## 2023-08-10 DIAGNOSIS — F251 Schizoaffective disorder, depressive type: Secondary | ICD-10-CM | POA: Diagnosis present

## 2023-08-10 DIAGNOSIS — G9340 Encephalopathy, unspecified: Principal | ICD-10-CM

## 2023-08-10 DIAGNOSIS — N400 Enlarged prostate without lower urinary tract symptoms: Secondary | ICD-10-CM | POA: Diagnosis present

## 2023-08-10 DIAGNOSIS — F0393 Unspecified dementia, unspecified severity, with mood disturbance: Secondary | ICD-10-CM | POA: Diagnosis present

## 2023-08-10 DIAGNOSIS — E7800 Pure hypercholesterolemia, unspecified: Secondary | ICD-10-CM | POA: Diagnosis present

## 2023-08-10 DIAGNOSIS — I10 Essential (primary) hypertension: Secondary | ICD-10-CM | POA: Diagnosis present

## 2023-08-10 DIAGNOSIS — R296 Repeated falls: Secondary | ICD-10-CM | POA: Diagnosis present

## 2023-08-10 DIAGNOSIS — Z7952 Long term (current) use of systemic steroids: Secondary | ICD-10-CM

## 2023-08-10 DIAGNOSIS — Z87891 Personal history of nicotine dependence: Secondary | ICD-10-CM | POA: Diagnosis not present

## 2023-08-10 DIAGNOSIS — B9629 Other Escherichia coli [E. coli] as the cause of diseases classified elsewhere: Secondary | ICD-10-CM | POA: Diagnosis present

## 2023-08-10 DIAGNOSIS — F32A Depression, unspecified: Secondary | ICD-10-CM | POA: Diagnosis present

## 2023-08-10 DIAGNOSIS — J439 Emphysema, unspecified: Secondary | ICD-10-CM | POA: Diagnosis present

## 2023-08-10 DIAGNOSIS — I700 Atherosclerosis of aorta: Secondary | ICD-10-CM | POA: Diagnosis present

## 2023-08-10 DIAGNOSIS — F70 Mild intellectual disabilities: Secondary | ICD-10-CM | POA: Diagnosis present

## 2023-08-10 DIAGNOSIS — Z8619 Personal history of other infectious and parasitic diseases: Secondary | ICD-10-CM

## 2023-08-10 DIAGNOSIS — E876 Hypokalemia: Secondary | ICD-10-CM | POA: Diagnosis present

## 2023-08-10 DIAGNOSIS — E872 Acidosis, unspecified: Secondary | ICD-10-CM | POA: Diagnosis present

## 2023-08-10 DIAGNOSIS — L400 Psoriasis vulgaris: Secondary | ICD-10-CM | POA: Diagnosis present

## 2023-08-10 DIAGNOSIS — U071 COVID-19: Secondary | ICD-10-CM | POA: Diagnosis present

## 2023-08-10 DIAGNOSIS — I444 Left anterior fascicular block: Secondary | ICD-10-CM | POA: Diagnosis present

## 2023-08-10 DIAGNOSIS — A4189 Other specified sepsis: Principal | ICD-10-CM | POA: Diagnosis present

## 2023-08-10 DIAGNOSIS — R32 Unspecified urinary incontinence: Secondary | ICD-10-CM | POA: Diagnosis present

## 2023-08-10 DIAGNOSIS — R569 Unspecified convulsions: Secondary | ICD-10-CM | POA: Diagnosis present

## 2023-08-10 DIAGNOSIS — I959 Hypotension, unspecified: Secondary | ICD-10-CM | POA: Diagnosis present

## 2023-08-10 DIAGNOSIS — Z8679 Personal history of other diseases of the circulatory system: Secondary | ICD-10-CM | POA: Diagnosis not present

## 2023-08-10 DIAGNOSIS — Z79818 Long term (current) use of other agents affecting estrogen receptors and estrogen levels: Secondary | ICD-10-CM

## 2023-08-10 DIAGNOSIS — Z7951 Long term (current) use of inhaled steroids: Secondary | ICD-10-CM

## 2023-08-10 DIAGNOSIS — R451 Restlessness and agitation: Secondary | ICD-10-CM | POA: Diagnosis present

## 2023-08-10 DIAGNOSIS — Z8701 Personal history of pneumonia (recurrent): Secondary | ICD-10-CM

## 2023-08-10 LAB — TSH: TSH: 3.625 u[IU]/mL (ref 0.350–4.500)

## 2023-08-10 LAB — BASIC METABOLIC PANEL
Anion gap: 11 (ref 5–15)
BUN: 15 mg/dL (ref 8–23)
CO2: 22 mmol/L (ref 22–32)
Calcium: 8.9 mg/dL (ref 8.9–10.3)
Chloride: 108 mmol/L (ref 98–111)
Creatinine, Ser: 0.73 mg/dL (ref 0.61–1.24)
GFR, Estimated: >60 mL/min (ref >60)
Glucose, Bld: 99 mg/dL (ref 70–99)
Potassium: 3.4 mmol/L — ABNORMAL LOW (ref 3.5–5.1)
Sodium: 141 mmol/L (ref 135–145)

## 2023-08-10 LAB — RESP PANEL BY RT-PCR (RSV, FLU A&B, COVID)  RVPGX2
Influenza A by PCR: NEGATIVE
Influenza B by PCR: NEGATIVE
Resp Syncytial Virus by PCR: NEGATIVE
SARS Coronavirus 2 by RT PCR: POSITIVE — AB

## 2023-08-10 LAB — HEPATIC FUNCTION PANEL
ALT: 12 U/L (ref 0–44)
AST: 28 U/L (ref 15–41)
Albumin: 3.7 g/dL (ref 3.5–5.0)
Alkaline Phosphatase: 55 U/L (ref 38–126)
Bilirubin, Direct: 0.2 mg/dL (ref 0.0–0.2)
Indirect Bilirubin: 0.6 mg/dL (ref 0.3–0.9)
Total Bilirubin: 0.8 mg/dL (ref 0.3–1.2)
Total Protein: 6.9 g/dL (ref 6.5–8.1)

## 2023-08-10 LAB — CBC
HCT: 35.5 % — ABNORMAL LOW (ref 39.0–52.0)
Hemoglobin: 11.5 g/dL — ABNORMAL LOW (ref 13.0–17.0)
MCH: 31.2 pg (ref 26.0–34.0)
MCHC: 32.4 g/dL (ref 30.0–36.0)
MCV: 96.2 fL (ref 80.0–100.0)
Platelets: 222 10*3/uL (ref 150–400)
RBC: 3.69 MIL/uL — ABNORMAL LOW (ref 4.22–5.81)
RDW: 14.6 % (ref 11.5–15.5)
WBC: 10.6 10*3/uL — ABNORMAL HIGH (ref 4.0–10.5)
nRBC: 0 % (ref 0.0–0.2)

## 2023-08-10 LAB — LACTIC ACID, PLASMA: Lactic Acid, Venous: 1.4 mmol/L (ref 0.5–1.9)

## 2023-08-10 MED ORDER — HALOPERIDOL LACTATE 5 MG/ML IJ SOLN
INTRAMUSCULAR | Status: AC
  Administered 2023-08-10: 1 mg via INTRAMUSCULAR
  Filled 2023-08-10: qty 1

## 2023-08-10 MED ORDER — HYDROCODONE-ACETAMINOPHEN 5-325 MG PO TABS: 1.0000 | ORAL_TABLET | ORAL | Status: DC | PRN

## 2023-08-10 MED ORDER — FLUDROCORTISONE ACETATE 0.1 MG PO TABS
0.2000 mg | ORAL_TABLET | Freq: Every day | ORAL | Status: DC
  Administered 2023-08-12 – 2023-08-13 (×2): 0.2 mg via ORAL
  Filled 2023-08-10 (×3): qty 2

## 2023-08-10 MED ORDER — PRAVASTATIN SODIUM 20 MG PO TABS
20.0000 mg | ORAL_TABLET | Freq: Every day | ORAL | Status: DC
  Administered 2023-08-12: 20 mg via ORAL
  Filled 2023-08-10 (×2): qty 1

## 2023-08-10 MED ORDER — GABAPENTIN 100 MG PO CAPS
100.0000 mg | ORAL_CAPSULE | Freq: Three times a day (TID) | ORAL | Status: DC
  Administered 2023-08-12 – 2023-08-13 (×4): 100 mg via ORAL
  Filled 2023-08-10 (×7): qty 1

## 2023-08-10 MED ORDER — UMECLIDINIUM BROMIDE 62.5 MCG/ACT IN AEPB
1.0000 | INHALATION_SPRAY | Freq: Every day | RESPIRATORY_TRACT | Status: DC
  Administered 2023-08-13: 1 via RESPIRATORY_TRACT
  Filled 2023-08-10: qty 7

## 2023-08-10 MED ORDER — FLUOXETINE HCL 10 MG PO CAPS: 30.0000 mg | ORAL_CAPSULE | Freq: Every day | ORAL | Status: DC

## 2023-08-10 MED ORDER — LORAZEPAM 1 MG PO TABS
1.0000 mg | ORAL_TABLET | Freq: Three times a day (TID) | ORAL | Status: DC | PRN
  Administered 2023-08-10 – 2023-08-12 (×4): 1 mg via ORAL
  Filled 2023-08-10 (×4): qty 1

## 2023-08-10 MED ORDER — SODIUM CHLORIDE 0.9 % IV SOLN
1.0000 g | Freq: Once | INTRAVENOUS | Status: AC
  Administered 2023-08-10: 1 g via INTRAVENOUS
  Filled 2023-08-10: qty 20

## 2023-08-10 MED ORDER — HALOPERIDOL LACTATE 5 MG/ML IJ SOLN
1.0000 mg | INTRAMUSCULAR | Status: DC | PRN
  Filled 2023-08-10: qty 1

## 2023-08-10 MED ORDER — OLANZAPINE 5 MG PO TABS
10.0000 mg | ORAL_TABLET | Freq: Two times a day (BID) | ORAL | Status: DC
  Administered 2023-08-12 – 2023-08-13 (×3): 10 mg via ORAL
  Filled 2023-08-10 (×5): qty 2

## 2023-08-10 MED ORDER — TRAZODONE HCL 50 MG PO TABS
50.0000 mg | ORAL_TABLET | Freq: Every day | ORAL | Status: DC
  Administered 2023-08-10 – 2023-08-12 (×3): 50 mg via ORAL
  Filled 2023-08-10 (×3): qty 1

## 2023-08-10 MED ORDER — MORPHINE SULFATE (PF) 2 MG/ML IV SOLN: 2.0000 mg | INTRAVENOUS | Status: DC | PRN

## 2023-08-10 MED ORDER — FINASTERIDE 5 MG PO TABS
5.0000 mg | ORAL_TABLET | Freq: Every day | ORAL | Status: DC
  Administered 2023-08-10 – 2023-08-13 (×3): 5 mg via ORAL
  Filled 2023-08-10 (×5): qty 1

## 2023-08-10 MED ORDER — LACTATED RINGERS IV SOLN: INTRAVENOUS | Status: DC

## 2023-08-10 MED ORDER — ACETAMINOPHEN 325 MG PO TABS
650.0000 mg | ORAL_TABLET | Freq: Four times a day (QID) | ORAL | Status: DC | PRN
  Administered 2023-08-13: 650 mg via ORAL
  Filled 2023-08-10: qty 2

## 2023-08-10 MED ORDER — VALBENAZINE TOSYLATE 40 MG PO CAPS
40.0000 mg | ORAL_CAPSULE | Freq: Every day | ORAL | Status: DC
  Administered 2023-08-12 – 2023-08-13 (×2): 40 mg via ORAL
  Filled 2023-08-10 (×3): qty 1

## 2023-08-10 MED ORDER — NIRMATRELVIR/RITONAVIR (PAXLOVID)TABLET
3.0000 | ORAL_TABLET | Freq: Two times a day (BID) | ORAL | Status: DC
  Administered 2023-08-10 – 2023-08-13 (×4): 3 via ORAL
  Filled 2023-08-10: qty 30

## 2023-08-10 MED ORDER — MEGESTROL ACETATE 400 MG/10ML PO SUSP
400.0000 mg | Freq: Every day | ORAL | Status: DC
  Administered 2023-08-12 – 2023-08-13 (×2): 400 mg via ORAL
  Filled 2023-08-10 (×3): qty 10

## 2023-08-10 MED ORDER — LACTATED RINGERS IV BOLUS (SEPSIS)
1000.0000 mL | Freq: Once | INTRAVENOUS | Status: AC
  Administered 2023-08-10: 1000 mL via INTRAVENOUS

## 2023-08-10 MED ORDER — LEVETIRACETAM 500 MG PO TABS
500.0000 mg | ORAL_TABLET | Freq: Two times a day (BID) | ORAL | Status: DC
  Administered 2023-08-10: 500 mg via ORAL
  Filled 2023-08-10: qty 1

## 2023-08-10 MED ORDER — NICOTINE 21 MG/24HR TD PT24
21.0000 mg | MEDICATED_PATCH | Freq: Every day | TRANSDERMAL | Status: DC
  Administered 2023-08-11 – 2023-08-13 (×3): 21 mg via TRANSDERMAL
  Filled 2023-08-10 (×2): qty 1

## 2023-08-10 MED ORDER — HALOPERIDOL LACTATE 5 MG/ML IJ SOLN
5.0000 mg | Freq: Once | INTRAMUSCULAR | Status: AC
  Administered 2023-08-10: 5 mg via INTRAVENOUS

## 2023-08-10 MED ORDER — MONTELUKAST SODIUM 10 MG PO TABS
10.0000 mg | ORAL_TABLET | Freq: Every day | ORAL | Status: DC
  Administered 2023-08-10 – 2023-08-12 (×2): 10 mg via ORAL
  Filled 2023-08-10 (×3): qty 1

## 2023-08-10 MED ORDER — ASPIRIN 81 MG PO CHEW
81.0000 mg | CHEWABLE_TABLET | Freq: Every day | ORAL | Status: DC
  Administered 2023-08-12 – 2023-08-13 (×2): 81 mg via ORAL
  Filled 2023-08-10 (×3): qty 1

## 2023-08-10 MED ORDER — FLUTICASONE FUROATE-VILANTEROL 100-25 MCG/ACT IN AEPB
1.0000 | INHALATION_SPRAY | Freq: Every day | RESPIRATORY_TRACT | Status: DC
  Administered 2023-08-13: 1 via RESPIRATORY_TRACT
  Filled 2023-08-10: qty 28

## 2023-08-10 MED ORDER — ACETAMINOPHEN 650 MG RE SUPP: 650.0000 mg | Freq: Four times a day (QID) | RECTAL | Status: DC | PRN

## 2023-08-10 MED ORDER — HALOPERIDOL LACTATE 5 MG/ML IJ SOLN
1.0000 mg | INTRAMUSCULAR | Status: DC | PRN
  Administered 2023-08-11 (×2): 1 mg via INTRAMUSCULAR
  Filled 2023-08-10 (×2): qty 1

## 2023-08-10 MED ORDER — FLUOXETINE HCL 10 MG PO CAPS
30.0000 mg | ORAL_CAPSULE | Freq: Every day | ORAL | Status: DC
  Administered 2023-08-12 – 2023-08-13 (×2): 30 mg via ORAL
  Filled 2023-08-10 (×3): qty 3

## 2023-08-10 MED ORDER — HEPARIN SODIUM (PORCINE) 5000 UNIT/ML IJ SOLN
5000.0000 [IU] | Freq: Two times a day (BID) | INTRAMUSCULAR | Status: DC
  Filled 2023-08-10: qty 1

## 2023-08-10 MED ORDER — MIDODRINE HCL 5 MG PO TABS
10.0000 mg | ORAL_TABLET | Freq: Three times a day (TID) | ORAL | Status: DC
  Administered 2023-08-12 – 2023-08-13 (×4): 10 mg via ORAL
  Filled 2023-08-10 (×7): qty 2

## 2023-08-10 MED ORDER — HYDROXYZINE HCL 50 MG PO TABS
50.0000 mg | ORAL_TABLET | Freq: Three times a day (TID) | ORAL | Status: DC | PRN
  Filled 2023-08-10: qty 1

## 2023-08-10 MED ORDER — BENZTROPINE MESYLATE 0.5 MG PO TABS
0.5000 mg | ORAL_TABLET | Freq: Two times a day (BID) | ORAL | Status: DC
  Administered 2023-08-10 – 2023-08-13 (×4): 0.5 mg via ORAL
  Filled 2023-08-10 (×5): qty 1

## 2023-08-10 MED ORDER — MIDAZOLAM HCL 2 MG/2ML IJ SOLN
3.0000 mg | Freq: Once | INTRAMUSCULAR | Status: AC
  Administered 2023-08-10: 3 mg via INTRAVENOUS
  Filled 2023-08-10: qty 4

## 2023-08-10 MED ORDER — SODIUM CHLORIDE 0.9% FLUSH
3.0000 mL | Freq: Two times a day (BID) | INTRAVENOUS | Status: DC
  Administered 2023-08-11 – 2023-08-13 (×5): 3 mL via INTRAVENOUS

## 2023-08-10 NOTE — Progress Notes (Signed)
Notified bedside nurse of need to administer antibiotics  Per Bedside RN-> patient extremely agitated and they have been unable to give medication yet.

## 2023-08-10 NOTE — Hospital Course (Signed)
AMS UTI Covid.

## 2023-08-10 NOTE — ED Notes (Signed)
Pt removed IV. Pt refuses to let RN place another one. Pt refuses vitals equipment. MD aware. Pt refuses to stay in bed, posey alarm on.

## 2023-08-10 NOTE — ED Notes (Signed)
RN attempted an IV placement on pt for a 2nd attempt. Pt grabbed RN's arms and pulled the IV out. Pt stated "I don't want one of those." RN notified provider again of incident.

## 2023-08-10 NOTE — ED Provider Notes (Signed)
[facility] Provider Note    Event Date/Time   First MD Initiated Contact with Patient 08/10/23 1535     (approximate)   History   Hypotension   HPI  Ian Medina is a 67 year old male with history of dementia, COPD, schizoaffective disorder presenting to the emergency departm of altered mental status. Patient was at his facility was felt to not be acting normal, so he was sent to the ER.  With EMS, patient was initially hypotensive with a BP of 80/50 improved to 96/61 after fluids.  Reportedly had a fall yesterday without known injuries.  Patient states he was resting his foot on something lost his balance and fell and hit his head.  Currently complaining of headache.  Collateral history obtained from RN Britta Mccreedy at patient's facility.  She reports that on Wednesday patient tested positive for COVID.  It was difficult to keep him in his room as he was regularly trying to interact with residents he would not tested positive.  In the setting of this, he was placed on Ativan by facility provider.  He has been taking this several times a day.  After starting this, he has now developed some incontinence, frequent falls when he normally has a stable gait as well as generalized weakness.  Does think he has had multiple falls.  Reports no significant confusion at baseline.  Last seen well Wednesday afternoon.  I did review his discharge summary from 07/04/2023.  At that time he presented with increased confusion found to have a urinary tract infection, possible pneumonia, lactic acidosis.  He did have hypotension requiring pressors, midodrine, Florinef.  He was treated with meropenem given history of ESBL UTI.     Physical Exam   Triage Vital Signs: ED Triage Vitals  Encounter Vitals Group     BP 08/10/23 1512 (!) 85/62     Systolic BP Percentile --      Diastolic BP Percentile --      Pulse Rate 08/10/23 1512 (!) 104     Resp 08/10/23 1512 20     Temp 08/10/23 1512  98 F (36.7 C)     Temp src --      SpO2 08/10/23 1512 97 %     Weight --      Height --      Head Circumference --      Peak Flow --      Pain Score 08/10/23 1506 0     Pain Loc --      Pain Education --      Exclude from Growth Chart --     Most recent vital signs: Vitals:   08/10/23 1940 08/10/23 2334  BP: 101/60   Pulse: 86   Resp: 18   Temp:  98.4 F (36.9 C)  SpO2: 100%      General: Awake, interactive  CV:  Regular rate at the time of my initial evaluation, good peripheral perfusion.  Resp:  Lungs clear, unlabored respirations.  Abd:  Soft, nondistended, no appreciable tenderness to palpation Neuro:  Keenly aware, unable to answer month, thinks the year is 2023, able to say we are in [facility] and eventually in a hospital after multiple prompts, says this is due to a fall, able to blink eyes and squeeze hands, normal horizontal extraocular movements, no field cut, no gross facial asymmetry, no appreciable arm or leg drift though difficult to assess due to patient positioning and cooperation, no ataxia, normal sensation, no aphasia,  dysarthria, inattention   ED Results / Procedures / Treatments   Labs (all labs ordered are listed, but only abnormal results are displayed) Labs Reviewed  RESP PANEL BY RT-PCR (RSV, FLU A&B, COVID)  [id number] - Abnormal; Notable for the following components:      Result Value   SARS Coronavirus 2 by RT PCR POSITIVE (*)    All other components within normal limits  BASIC METABOLIC PANEL - Abnormal; Notable for the following components:   Potassium 3.4 (*)    All other components within normal limits  CBC - Abnormal; Notable for the following components:   WBC 10.6 (*)    RBC 3.69 (*)    Hemoglobin 11.5 (*)    HCT 35.5 (*)    All other components within normal limits  CULTURE, BLOOD (ROUTINE X 2)  CULTURE, BLOOD (ROUTINE X 2)  LACTIC ACID, PLASMA  HEPATIC FUNCTION PANEL  TSH  LACTIC ACID, PLASMA  URINALYSIS, W/ REFLEX TO  CULTURE (INFECTION SUSPECTED)  BLOOD GAS, VENOUS  COMPREHENSIVE METABOLIC PANEL  CBC  CBG MONITORING, ED     EKG EKG independently reviewed interpreted by myself (ER attending) demonstrates:  EKG demonstrates sinus tachycardia at a rate of 106, PR 140, QRS 86, QTc 478, no acute ST changes  RADIOLOGY Imaging independently reviewed and interpreted by myself demonstrates:  CXR without focal consolidation CT head without acute bleed CT C-spine without acute fracture  PROCEDURES:  Critical Care performed: Yes, see critical care procedure note(s)  CRITICAL CARE Performed by: Trinna Post   Total critical care time: 42 minutes  Critical care time was exclusive of separately billable procedures and treating other patients.  Critical care was necessary to treat or prevent imminent or life-threatening deterioration.  Critical care was time spent personally by me on the following activities: development of treatment plan with patient and/or surrogate as well as nursing, discussions with consultants, evaluation of patient's response to treatment, examination of patient, obtaining history from patient or surrogate, ordering and performing treatments and interventions, ordering and review of laboratory studies, ordering and review of radiographic studies, pulse oximetry and re-evaluation of patient's condition.   Procedures   MEDICATIONS ORDERED IN ED: Medications  nirmatrelvir/ritonavir (PAXLOVID) 3 tablet (3 tablets Oral Given 08/10/23 2154)  benztropine (COGENTIN) tablet 0.5 mg (0.5 mg Oral Given 08/10/23 2128)  fludrocortisone (FLORINEF) tablet 0.2 mg (has no administration in time range)  FLUoxetine (PROZAC) capsule 30 mg (has no administration in time range)  levETIRAcetam (KEPPRA) tablet 500 mg (500 mg Oral Given 08/10/23 2128)  pravastatin (PRAVACHOL) tablet 20 mg (has no administration in time range)  megestrol (MEGACE) 400 MG/10ML suspension 400 mg (has no administration in time  range)  midodrine (PROAMATINE) tablet 10 mg (has no administration in time range)  traZODone (DESYREL) tablet 50 mg (50 mg Oral Given 08/10/23 2129)  montelukast (SINGULAIR) tablet 10 mg (10 mg Oral Given 08/10/23 2128)  aspirin chewable tablet 81 mg (has no administration in time range)  finasteride (PROSCAR) tablet 5 mg (5 mg Oral Given 08/10/23 2155)  fluticasone furoate-vilanterol (BREO ELLIPTA) 100-25 MCG/ACT 1 puff (has no administration in time range)    And  umeclidinium bromide (INCRUSE ELLIPTA) 62.5 MCG/ACT 1 puff (has no administration in time range)  gabapentin (NEURONTIN) capsule 100 mg (has no administration in time range)  valbenazine (INGREZZA) capsule 40 mg (has no administration in time range)  OLANZapine (ZYPREXA) tablet 10 mg (has no administration in time range)  LORazepam (ATIVAN) tablet 1 mg (1 mg  Oral Given 08/10/23 2128)  hydrOXYzine (ATARAX) tablet 50 mg (has no administration in time range)  heparin injection 5,000 Units (5,000 Units Subcutaneous Patient Refused/Not Given 08/10/23 2129)  sodium chloride flush (NS) 0.9 % injection 3 mL (has no administration in time range)  acetaminophen (TYLENOL) tablet 650 mg (has no administration in time range)    Or  acetaminophen (TYLENOL) suppository 650 mg (has no administration in time range)  HYDROcodone-acetaminophen (NORCO/VICODIN) 5-325 MG per tablet 1 tablet (has no administration in time range)  morphine (PF) 2 MG/ML injection 2 mg (has no administration in time range)  lactated ringers infusion (has no administration in time range)  haloperidol lactate (HALDOL) injection 1 mg (1 mg Intramuscular Given 08/10/23 2146)  nicotine (NICODERM CQ - dosed in mg/24 hours) patch 21 mg (has no administration in time range)  lactated ringers bolus 1,000 mL ( Intravenous Restarted 08/10/23 2335)    And  lactated ringers bolus 1,000 mL ( Intravenous Restarted 08/10/23 2335)  meropenem (MERREM) 1 g in sodium chloride 0.9 % 100 mL IVPB (  Intravenous Restarted 08/10/23 2335)  haloperidol lactate (HALDOL) injection 5 mg (5 mg Intravenous Given 08/10/23 1743)  midazolam (VERSED) injection 3 mg (3 mg Intravenous Given 08/10/23 1824)     IMPRESSION / MDM / ASSESSMENT AND PLAN / ED COURSE  I reviewed the triage vital signs and the nursing notes.  Differential diagnosis includes, but is not limited to, intracranial bleed, skull fracture, medical encephalopathy in the setting of infection, electrolyte abnormality, anemia, medication adverse effect  Patient's presentation is most consistent with acute presentation with potential threat to life or bodily function.  67 year old male presenting to the emergency department for evaluation of altered mental status.  Initial vitals here with hypotension and tachycardia, though at the time of my initial evaluation both heart rate and blood pressure had improved.  I did note recent admission for sepsis, so sepsis orders were initiated with empiric Rocephin based on recent ESBL UTI.  He does have some confusion on exam here though he is alert and able to partake in conversation.  After discussion with facility worker, consideration for medication adverse effect in the setting of recently starting Ativan.   While here, patient became increasingly more difficult to redirect repeatedly trying to get up out of bed.  He was given Haldol with limited effect as he continued to try and get out of bed and was unable to be verbally redirected.  He was given some IV Versed with improvement.  His labs demonstrated mild leukocytosis with WBC of 10.6.  Normal lactate at 1.4.  COVID test here was confirmed to be positive.  Vital signs improved after fluids here.  Patient still unable to provide urinalysis despite multiple attempts to get patient to provide sample.  Unclear if his encephalopathy is rated to COVID versus another infection versus medication adverse effect, but given degree of encephalopathy, do think he is  appropriate for admission.  Will reach out to hospitalist team.  Discussed with Dr. Allena Katz.  Recommended venous blood gas for further evaluation.  Did also recommend discussing with pharmacy to see treatment recommendations regarding COVID.  Spoke with pharmacist, given that patient does not have an oxygen requirement and is under 5 days from onset of symptoms, he is eligible for Paxlovid which she will place an order for.  Continue plan for admission.    FINAL CLINICAL IMPRESSION(S) / ED DIAGNOSES   Final diagnoses:  Acute encephalopathy  COVID-19  Rx / DC Orders   ED Discharge Orders     None        Note:  This document was prepared using Dragon voice recognition software and may include unintentional dictation errors.   Trinna Post, MD 08/10/23 [phone]

## 2023-08-10 NOTE — Sepsis Progress Note (Signed)
9/13 elink following code sepsis

## 2023-08-10 NOTE — ED Triage Notes (Signed)
Pt comes via EMS from [facility] with c/o not acting normal. Pt was hypotensive with EMS.   BP-80/50 96/61 after fluids  Pt did have fall yesterday. Pt denies any injury.

## 2023-08-10 NOTE — H&P (Signed)
History and Physical    Patient: Ian Medina UEA:[id number] DOB: Aug 09, 1956 DOA: 08/10/2023 DOS: the patient was seen and examined on 08/11/2023 PCP: Pcp, No  Patient coming from: Home   Chief Complaint:  Chief Complaint  Patient presents with   Hypotension    HPI: Ian Medina is a 67 y.o. male with medical history significant for  COPD,GERD, HTN, febrile aneurysm ruptured, generalized anxiety disorder presenting with low blood pressure at 80/15 tachycardic with a heart rate of 104 respirations of 20 and 96/61 after initial bolus.  Patient meeting  sepsis criteria.  Patient was given 2 L LR along with Merrem for the UTI.  Patient was also started on Paxlovid for his COVID diagnosed in ed today.  Per report patient had a fall yesterday and did strike his head in the ED patient was complaining of headache.  Patient was discharged on 7 August after being treated for ESBL urinary tract infection.  EKG in the emergency room shows sinus tach at 106 PR interval 148 QTc 478 left anterior fascicular block normal axis.  Chest x-ray negative for consolidation, head CT negative for bleed, CT C-spine negative for any fracture or dislocation.  While in the hospital patient was agitated restless and pulling out his lines and becoming uncooperative.  He was given IV Versed, patient was not cooperating with the exam, patient did not give urine sample.  Review of Systems: Review of Systems  Unable to perform ROS: Mental status change  Musculoskeletal:  Positive for joint pain.  Psychiatric/Behavioral:  The patient is nervous/anxious.     Past Medical History:  Diagnosis Date   Anxiety    Atherosclerosis of aorta (HCC)    Auditory hallucination    Cerebral aneurysm, nonruptured    COPD with emphysema (HCC)    Drug abuse (HCC)    Dysphagia, oropharyngeal phase    GAD (generalized anxiety disorder)    GERD (gastroesophageal reflux disease)    Hepatitis C    Hypercholesterolemia    Hypertension     Middle cerebral artery aneurysm    Mild intellectual disabilities    Paranoia (HCC)    Pneumonia 2017   Psoriasis    Psoriasis vulgaris    Repeated falls    Schizoaffective disorder, depressive type (HCC)    Schizophrenia (HCC)    diagnosed at age 60, treated by ACT team   Past Surgical History:  Procedure Laterality Date   CHEST TUBE INSERTION     COLONOSCOPY  Nov 2011   Dr. Samuella Cota: normal, internal hemorrhoids   COLONOSCOPY  Sept 2009   small internal hemorrhoids, normal    COLONOSCOPY WITH PROPOFOL N/A 05/08/2023   Procedure: COLONOSCOPY WITH PROPOFOL;  Surgeon: Dolores Frame, MD;  Location: [facility];  Service: Gastroenterology;  Laterality: N/A;  10:00am; asa 3   ESOPHAGOGASTRODUODENOSCOPY  June 2010   Dr. Samuella Cota: normal   ESOPHAGOGASTRODUODENOSCOPY  Jan 2009   Dr. Allena Katz: linear esophagitis, mild stricture, diffuse gastritis and mild duodenitis, PATH: chronic gastritis, negative H.pylori    FOOT SURGERY     right   POLYPECTOMY  05/08/2023   Procedure: POLYPECTOMY;  Surgeon: Dolores Frame, MD;  Location: [facility];  Service: Gastroenterology;;   Social History:   reports that he has quit smoking. His smoking use included cigarettes. He has been exposed to tobacco smoke. He has never used smokeless tobacco. He reports that he does not currently use drugs. He reports that he does not drink alcohol.  Allergies  Allergen  Reactions   Propoxyphene Rash    Family History  Problem Relation Age of Onset   COPD Father    Colon cancer Neg Hx     Prior to Admission medications   Medication Sig Start Date End Date Taking? Authorizing Provider  aspirin 81 MG chewable tablet Chew 81 mg by mouth daily.   Yes [provider]  benztropine (COGENTIN) 0.5 MG tablet Take 1 tablet (0.5 mg total) by mouth 2 (two) times daily. 11/09/22  Yes Sunnie Nielsen, DO  bisacodyl (BISACODYL LAXATIVE) 10 MG suppository Place 10 mg rectally See admin  instructions. Insert 1 suppository rectally every other night at bedtime   Yes [provider]  finasteride (PROSCAR) 5 MG tablet Take 5 mg by mouth daily.   Yes [provider]  fludrocortisone (FLORINEF) 0.1 MG tablet Take 2 tablets (0.2 mg total) by mouth daily. 07/05/23  Yes Marrion Coy, MD  FLUoxetine (PROZAC) 10 MG capsule Take 10 mg by mouth daily. (Take with 20mg  capsule to equal 30mg  total)   Yes [provider]  FLUoxetine (PROZAC) 20 MG capsule Take 1 capsule (20 mg total) by mouth daily. Patient taking differently: Take 20 mg by mouth daily. (Take with 10mg  capsule to equal 30mg  total) 06/07/22  Yes Gilles Chiquito, MD  Fluticasone-Umeclidin-Vilant (TRELEGY ELLIPTA) 100-62.5-25 MCG/ACT AEPB Inhale 1 puff into the lungs daily.   Yes [provider]  gabapentin (NEURONTIN) 100 MG capsule Take 100 mg by mouth 3 (three) times daily.   Yes [provider]  hydrOXYzine (ATARAX) 50 MG tablet Take 50 mg by mouth every 8 (eight) hours as needed (agitation).   Yes [provider]  levETIRAcetam (KEPPRA) 500 MG tablet Take 1 tablet (500 mg total) by mouth 2 (two) times daily. 10/01/22  Yes Bethann Berkshire, MD  LORazepam (ATIVAN) 1 MG tablet Take 1 mg by mouth every 8 (eight) hours as needed for anxiety or sedation.   Yes [provider]  lovastatin (MEVACOR) 20 MG tablet Take 1 tablet (20 mg total) by mouth daily. Patient taking differently: Take 20 mg by mouth every evening. 06/07/22  Yes Gilles Chiquito, MD  megestrol (MEGACE) 400 MG/10ML suspension Take 10 mLs (400 mg total) by mouth daily. 07/05/23  Yes Marrion Coy, MD  melatonin 5 MG TABS Take 5 mg by mouth at bedtime.   Yes [provider]  midodrine (PROAMATINE) 10 MG tablet Take 1 tablet (10 mg total) by mouth 3 (three) times daily with meals. Patient taking differently: Take 10 mg by mouth 3 (three) times daily with meals. (Holf if SBP >140) 11/09/22  Yes Sunnie Nielsen, DO  montelukast (SINGULAIR) 10 MG tablet Take 1 tablet (10 mg total) by mouth at bedtime. 06/07/22  Yes Gilles Chiquito, MD  Multiple Vitamin (MULTIVITAMIN WITH MINERALS) TABS tablet Take 1 tablet by mouth daily. 11/09/22  Yes Alexander, Dorene Grebe, DO  OLANZapine (ZYPREXA) 10 MG tablet Take 10 mg by mouth 2 (two) times daily.   Yes [provider]  senna (SENOKOT) 8.6 MG TABS tablet Take 1 tablet by mouth daily.   Yes [provider]  traZODone (DESYREL) 50 MG tablet Take 1 tablet (50 mg total) by mouth at bedtime. 06/07/22  Yes Gilles Chiquito, MD  valbenazine [facility]) 40 MG capsule Take 40 mg by mouth daily.   Yes [provider]     Vitals:   08/10/23 1900 08/10/23 1940 08/10/23 1940 08/10/23 2334  BP:   101/60  Pulse:   86   Resp:   18   Temp:  98.6 F (37 C)  98.4 F (36.9 C)  TempSrc:  Oral  Oral  SpO2:   100%   Weight: 64.5 kg      Physical Exam Vitals and nursing note reviewed.  Constitutional:      General: He is not in acute distress. HENT:     Head: Normocephalic and atraumatic.     Right Ear: Hearing and external ear normal.     Left Ear: Hearing and external ear normal.     Nose: Nose normal. No nasal deformity.     Mouth/Throat:     Lips: Pink.     Tongue: No lesions.     Pharynx: Oropharynx is clear.  Eyes:     General: Lids are normal.     Extraocular Movements: Extraocular movements intact.  Cardiovascular:     Rate and Rhythm: Normal rate and regular rhythm.     Pulses: Normal pulses.     Heart sounds: Normal heart sounds.  Pulmonary:     Effort: Pulmonary effort is normal.     Breath sounds: Normal breath sounds.  Abdominal:     General: Bowel sounds are normal. There is no distension.     Palpations: Abdomen is soft. There is no mass.     Tenderness: There is no abdominal tenderness.  Musculoskeletal:     Right lower leg: No edema.     Left lower leg: No edema.  Skin:    General: Skin is warm.   Neurological:     General: No focal deficit present.     Mental Status: He is alert and oriented to person, place, and time.     Cranial Nerves: Cranial nerves 2-12 are intact.  Psychiatric:        Attention and Perception: Attention normal.        Mood and Affect: Mood normal.        Speech: Speech normal.        Behavior: Behavior normal. Behavior is cooperative.     Labs on Admission: I have personally reviewed following labs and imaging studies  CBC: Recent Labs  Lab 08/10/23 1507  WBC 10.6*  HGB 11.5*  HCT 35.5*  MCV 96.2  PLT 222   Basic Metabolic Panel: Recent Labs  Lab 08/10/23 1507  NA 141  K 3.4*  CL 108  CO2 22  GLUCOSE 99  BUN 15  CREATININE 0.73  CALCIUM 8.9   GFR: Estimated Creatinine Clearance: 84 mL/min (by C-G formula based on SCr of 0.73 mg/dL). Liver Function Tests: Recent Labs  Lab 08/10/23 1507  AST 28  ALT 12  ALKPHOS 55  BILITOT 0.8  PROT 6.9  ALBUMIN 3.7   No results for input(s): "LIPASE", "AMYLASE" in the last 168 hours. No results for input(s): "AMMONIA" in the last 168 hours. Coagulation Profile: No results for input(s): "INR", "PROTIME" in the last 168 hours. Cardiac Enzymes: No results for input(s): "CKTOTAL", "CKMB", "CKMBINDEX", "TROPONINI" in the last 168 hours. BNP (last 3 results) No results for input(s): "PROBNP" in the last 8760 hours. HbA1C: No results for input(s): "HGBA1C" in the last 72 hours. CBG: No results for input(s): "GLUCAP" in the last 168 hours. Lipid Profile: No results for input(s): "CHOL", "HDL", "LDLCALC", "TRIG", "CHOLHDL", "LDLDIRECT" in the last 72 hours. Thyroid Function Tests: Recent Labs    08/10/23 1640  TSH 3.625   Anemia Panel: No results for input(s): "VITAMINB12", "FOLATE", "  FERRITIN", "TIBC", "IRON", "RETICCTPCT" in the last 72 hours.  Unresulted Labs (From admission, onward)     Start     Ordered   08/11/23 0500  Comprehensive metabolic panel  Tomorrow morning,   R         08/10/23 2046   08/11/23 0500  CBC  Tomorrow morning,   R        08/10/23 2046   08/10/23 1556  Lactic acid, plasma  (Septic presentation on arrival (screening labs, nursing and treatment orders for obvious sepsis))  Now then every 2 hours,   STAT      08/10/23 1559   08/10/23 1556  Blood Culture (routine x 2)  (Septic presentation on arrival (screening labs, nursing and treatment orders for obvious sepsis))  BLOOD CULTURE X 2,   STAT      08/10/23 1559   08/10/23 1556  Urinalysis, w/ Reflex to Culture (Infection Suspected) -Urine, Clean Catch  (Septic presentation on arrival (screening labs, nursing and treatment orders for obvious sepsis))  ONCE - URGENT,   URGENT       Question:  Specimen Source  Answer:  Urine, Clean Catch   08/10/23 1559            Medications  nirmatrelvir/ritonavir (PAXLOVID) 3 tablet (3 tablets Oral Given 08/10/23 2154)  benztropine (COGENTIN) tablet 0.5 mg (0.5 mg Oral Given 08/10/23 2128)  fludrocortisone (FLORINEF) tablet 0.2 mg (has no administration in time range)  FLUoxetine (PROZAC) capsule 30 mg (has no administration in time range)  levETIRAcetam (KEPPRA) tablet 500 mg (500 mg Oral Given 08/10/23 2128)  pravastatin (PRAVACHOL) tablet 20 mg (has no administration in time range)  megestrol (MEGACE) 400 MG/10ML suspension 400 mg (has no administration in time range)  midodrine (PROAMATINE) tablet 10 mg (has no administration in time range)  traZODone (DESYREL) tablet 50 mg (50 mg Oral Given 08/10/23 2129)  montelukast (SINGULAIR) tablet 10 mg (10 mg Oral Given 08/10/23 2128)  aspirin chewable tablet 81 mg (has no administration in time range)  finasteride (PROSCAR) tablet 5 mg (5 mg Oral Given 08/10/23 2155)  fluticasone furoate-vilanterol (BREO ELLIPTA) 100-25 MCG/ACT 1 puff (has no administration in time range)    And  umeclidinium bromide (INCRUSE ELLIPTA) 62.5 MCG/ACT 1 puff (has no administration in time range)  gabapentin (NEURONTIN) capsule 100 mg (has  no administration in time range)  valbenazine (INGREZZA) capsule 40 mg (has no administration in time range)  OLANZapine (ZYPREXA) tablet 10 mg (has no administration in time range)  LORazepam (ATIVAN) tablet 1 mg (1 mg Oral Given 08/10/23 2128)  hydrOXYzine (ATARAX) tablet 50 mg (has no administration in time range)  heparin injection 5,000 Units (5,000 Units Subcutaneous Patient Refused/Not Given 08/10/23 2129)  sodium chloride flush (NS) 0.9 % injection 3 mL (has no administration in time range)  acetaminophen (TYLENOL) tablet 650 mg (has no administration in time range)    Or  acetaminophen (TYLENOL) suppository 650 mg (has no administration in time range)  morphine (PF) 2 MG/ML injection 2 mg (has no administration in time range)  lactated ringers infusion (has no administration in time range)  haloperidol lactate (HALDOL) injection 1 mg (1 mg Intramuscular Given 08/10/23 2146)  nicotine (NICODERM CQ - dosed in mg/24 hours) patch 21 mg (has no administration in time range)  HYDROcodone-acetaminophen (NORCO/VICODIN) 5-325 MG per tablet 1 tablet (has no administration in time range)  lactated ringers bolus 1,000 mL (0 mLs Intravenous Paused 08/11/23 0033)    And  lactated  ringers bolus 1,000 mL (0 mLs Intravenous Paused 08/11/23 0034)  meropenem (MERREM) 1 g in sodium chloride 0.9 % 100 mL IVPB (0 g Intravenous Stopped 08/11/23 0001)  haloperidol lactate (HALDOL) injection 5 mg (5 mg Intravenous Given 08/10/23 1743)  midazolam (VERSED) injection 3 mg (3 mg Intravenous Given 08/10/23 1824)    Radiological Exams on Admission: CT Head Wo Contrast  Result Date: 08/10/2023 CLINICAL DATA:  Altered mental status, recent fall, hypotensive EXAM: CT HEAD WITHOUT CONTRAST CT CERVICAL SPINE WITHOUT CONTRAST TECHNIQUE: Multidetector CT imaging of the head and cervical spine was performed following the standard protocol without intravenous contrast. Multiplanar CT image reconstructions of the cervical spine  were also generated. RADIATION DOSE REDUCTION: This exam was performed according to the departmental dose-optimization program which includes automated exposure control, adjustment of the mA and/or kV according to patient size and/or use of iterative reconstruction technique. COMPARISON:  04/27/2023 CT head and cervical spine FINDINGS: CT HEAD FINDINGS Brain: No evidence of acute infarct, hemorrhage, mass, mass effect, or midline shift. No hydrocephalus or extra-axial fluid collection. Age related cerebral atrophy. Vascular: No hyperdense vessel. Vascular coils in the proximal left MCA. Skull: Negative for fracture or focal lesion. Sinuses/Orbits: Mucosal thickening in the right maxillary sinus and ethmoid air cells. No acute finding in the orbits. Other: The mastoid air cells are well aerated. CT CERVICAL SPINE FINDINGS Alignment: No traumatic listhesis. Straightening and reversal of the normal cervical lordosis. Skull base and vertebrae: No acute fracture or suspicious osseous lesion. Soft tissues and spinal canal: No prevertebral fluid or swelling. No visible canal hematoma. Disc levels: Degenerative changes in the cervical spine. No high-grade spinal canal stenosis. Left-greater-than-right facet arthropathy. Upper chest: Negative. IMPRESSION: 1. No acute intracranial process. 2. No acute fracture or traumatic listhesis in the cervical spine. Electronically Signed   By: Wiliam Ke M.D.   On: 08/10/2023 18:32   CT Cervical Spine Wo Contrast  Result Date: 08/10/2023 CLINICAL DATA:  Altered mental status, recent fall, hypotensive EXAM: CT HEAD WITHOUT CONTRAST CT CERVICAL SPINE WITHOUT CONTRAST TECHNIQUE: Multidetector CT imaging of the head and cervical spine was performed following the standard protocol without intravenous contrast. Multiplanar CT image reconstructions of the cervical spine were also generated. RADIATION DOSE REDUCTION: This exam was performed according to the departmental dose-optimization  program which includes automated exposure control, adjustment of the mA and/or kV according to patient size and/or use of iterative reconstruction technique. COMPARISON:  04/27/2023 CT head and cervical spine FINDINGS: CT HEAD FINDINGS Brain: No evidence of acute infarct, hemorrhage, mass, mass effect, or midline shift. No hydrocephalus or extra-axial fluid collection. Age related cerebral atrophy. Vascular: No hyperdense vessel. Vascular coils in the proximal left MCA. Skull: Negative for fracture or focal lesion. Sinuses/Orbits: Mucosal thickening in the right maxillary sinus and ethmoid air cells. No acute finding in the orbits. Other: The mastoid air cells are well aerated. CT CERVICAL SPINE FINDINGS Alignment: No traumatic listhesis. Straightening and reversal of the normal cervical lordosis. Skull base and vertebrae: No acute fracture or suspicious osseous lesion. Soft tissues and spinal canal: No prevertebral fluid or swelling. No visible canal hematoma. Disc levels: Degenerative changes in the cervical spine. No high-grade spinal canal stenosis. Left-greater-than-right facet arthropathy. Upper chest: Negative. IMPRESSION: 1. No acute intracranial process. 2. No acute fracture or traumatic listhesis in the cervical spine. Electronically Signed   By: Wiliam Ke M.D.   On: 08/10/2023 18:32   DG Chest Port 1 View  Result Date: 08/10/2023 CLINICAL  DATA:  Hypotension, altered level of consciousness EXAM: PORTABLE CHEST 1 VIEW COMPARISON:  06/26/2023 FINDINGS: 2 frontal views of the chest demonstrate a stable cardiac silhouette. Continued ectasia of the thoracic aorta. No acute airspace disease, effusion, or pneumothorax. Continued background parenchymal scarring, basilar predominant. No effusion or pneumothorax. No acute bony abnormalities. IMPRESSION: 1. Chronic background scarring.  No acute airspace disease. Electronically Signed   By: Sharlet Salina M.D.   On: 08/10/2023 17:12     Data  Reviewed: Relevant notes from primary care and specialist visits, past discharge summaries as available in EHR, including Care Everywhere. Prior diagnostic testing as pertinent to current admission diagnoses Updated medications and problem lists for reconciliation ED course, including vitals, labs, imaging, treatment and response to treatment Triage notes, nursing and pharmacy notes and ED provider's notes Notable results as noted in HPI  Assessment and Plan: Aggressive behavior Patient has required Haldol and has been restless agitated uncooperative needs frequent redirection.  Cooperates with me and allows me to examine him but is nevertheless confused and disoriented.  As needed Haldol low-dose.  EKG as needed for QT monitoring.  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  Will consider starting patient on low-dose Seroquel.  Lactic acidosis Suspect lactic acidosis from hypoperfusion leading to hypovolemia from his low blood pressure.  Urinary tract infection due to extended-spectrum beta lactamase (ESBL) producing Escherichia coli Patient has history of ESBL UTI.  Boyd Kerbs was given meropenem in the emergency room and will continue the same.  Restlessness and agitation Suspect patient needs a neurology evaluation and formal neuropsych evaluation.  Hypomagnesemia Monitor levels and replace as needed.  Hypotension Unclear if this was septic shock like presentation or hypovolemia due to GI losses as patient is a poor historian or hypovolemia due to poor p.o. intake or hypovolemia due to GI losses again secondary to COVID which he was diagnosed on.  Regardless we will continue to hydrate patient with electrolyte management neurochecks aspiration fall and seizure precautions.  Neurology consult as deemed appropriate.  Hypokalemia Will replace and follow levels.  GERD (gastroesophageal reflux  disease) IV PPI therapy, aspiration precautions.  Schizoaffective disorder, depressive type (HCC) Will continue patient on Prozac and hydroxyzine.    DVT prophylaxis:  Heparin   Consults:  None   Advance Care Planning:    Code Status: Full Code   Family Communication:  None   Disposition Plan:  Home   Severity of Illness: The appropriate patient status for this patient is INPATIENT. Inpatient status is judged to be reasonable and necessary in order to provide the required intensity of service to ensure the patient's safety. The patient's presenting symptoms, physical exam findings, and initial radiographic and laboratory data in the context of their chronic comorbidities is felt to place them at high risk for further clinical deterioration. Furthermore, it is not anticipated that the patient will be medically stable for discharge from the hospital within 2 midnights of admission.  * I certify that at the point of admission it is my clinical judgment that the patient will require inpatient hospital care spanning beyond 2 midnights from the point of admission due to high intensity of service, high risk for further deterioration and high frequency of surveillance required.*  Author: Gertha Calkin, MD 08/11/2023 2:08 AM  For on call review [email or web address].

## 2023-08-10 NOTE — Progress Notes (Incomplete)
CODE SEPSIS - PHARMACY COMMUNICATION  **Broad Spectrum Antibiotics should be administered within 1 hour of Sepsis diagnosis**  Time Code Sepsis Called/Page Received: 1602  Antibiotics Ordered: ***  Time of 1st antibiotic administration: ***  Additional action taken by pharmacy: ***  If necessary, Name of Provider/Nurse Contacted: ***    Lowella Bandy ,PharmD Clinical Pharmacist  08/10/2023  4:00 PM

## 2023-08-10 NOTE — ED Notes (Signed)
Taking over care of pt at this time

## 2023-08-11 DIAGNOSIS — I959 Hypotension, unspecified: Secondary | ICD-10-CM | POA: Diagnosis not present

## 2023-08-11 DIAGNOSIS — U071 COVID-19: Secondary | ICD-10-CM | POA: Diagnosis not present

## 2023-08-11 DIAGNOSIS — G9341 Metabolic encephalopathy: Secondary | ICD-10-CM

## 2023-08-11 LAB — CBC
HCT: 33.4 % — ABNORMAL LOW (ref 39.0–52.0)
Hemoglobin: 10.9 g/dL — ABNORMAL LOW (ref 13.0–17.0)
MCH: 31.4 pg (ref 26.0–34.0)
MCHC: 32.6 g/dL (ref 30.0–36.0)
MCV: 96.3 fL (ref 80.0–100.0)
Platelets: 189 10*3/uL (ref 150–400)
RBC: 3.47 MIL/uL — ABNORMAL LOW (ref 4.22–5.81)
RDW: 14.8 % (ref 11.5–15.5)
WBC: 7.9 10*3/uL (ref 4.0–10.5)
nRBC: 0 % (ref 0.0–0.2)

## 2023-08-11 LAB — COMPREHENSIVE METABOLIC PANEL
ALT: 13 U/L (ref 0–44)
AST: 28 U/L (ref 15–41)
Albumin: 3.2 g/dL — ABNORMAL LOW (ref 3.5–5.0)
Alkaline Phosphatase: 47 U/L (ref 38–126)
Anion gap: 9 (ref 5–15)
BUN: 11 mg/dL (ref 8–23)
CO2: 24 mmol/L (ref 22–32)
Calcium: 8.5 mg/dL — ABNORMAL LOW (ref 8.9–10.3)
Chloride: 106 mmol/L (ref 98–111)
Creatinine, Ser: 0.62 mg/dL (ref 0.61–1.24)
GFR, Estimated: >60 mL/min (ref >60)
Glucose, Bld: 84 mg/dL (ref 70–99)
Potassium: 3.2 mmol/L — ABNORMAL LOW (ref 3.5–5.1)
Sodium: 139 mmol/L (ref 135–145)
Total Bilirubin: 1.1 mg/dL (ref 0.3–1.2)
Total Protein: 5.9 g/dL — ABNORMAL LOW (ref 6.5–8.1)

## 2023-08-11 LAB — BLOOD GAS, VENOUS
Acid-Base Excess: 6 mmol/L — ABNORMAL HIGH (ref 0.0–2.0)
Bicarbonate: 30.6 mmol/L — ABNORMAL HIGH (ref 20.0–28.0)
O2 Saturation: 82.6 %
Patient temperature: 37
pCO2, Ven: 43 mmHg — ABNORMAL LOW (ref 44–60)
pH, Ven: 7.46 — ABNORMAL HIGH (ref 7.25–7.43)
pO2, Ven: 50 mmHg — ABNORMAL HIGH (ref 32–45)

## 2023-08-11 LAB — LACTIC ACID, PLASMA: Lactic Acid, Venous: 1.1 mmol/L (ref 0.5–1.9)

## 2023-08-11 MED ORDER — HYDROCODONE-ACETAMINOPHEN 5-325 MG PO TABS
1.0000 | ORAL_TABLET | Freq: Four times a day (QID) | ORAL | Status: DC | PRN
  Administered 2023-08-13: 1 via ORAL
  Filled 2023-08-11 (×2): qty 1

## 2023-08-11 MED ORDER — LEVETIRACETAM IN NACL 500 MG/100ML IV SOLN
500.0000 mg | Freq: Two times a day (BID) | INTRAVENOUS | Status: DC
  Administered 2023-08-11 – 2023-08-13 (×5): 500 mg via INTRAVENOUS
  Filled 2023-08-11 (×7): qty 100

## 2023-08-11 MED ORDER — POTASSIUM CHLORIDE 20 MEQ PO PACK
40.0000 meq | PACK | Freq: Once | ORAL | Status: AC
  Administered 2023-08-11: 40 meq via ORAL
  Filled 2023-08-11: qty 2

## 2023-08-11 MED ORDER — SODIUM CHLORIDE 0.9 % IV SOLN
1.0000 g | Freq: Three times a day (TID) | INTRAVENOUS | Status: DC
  Administered 2023-08-11 – 2023-08-13 (×7): 1 g via INTRAVENOUS
  Filled 2023-08-11 (×9): qty 20

## 2023-08-11 MED ORDER — HALOPERIDOL LACTATE 5 MG/ML IJ SOLN
2.0000 mg | Freq: Four times a day (QID) | INTRAMUSCULAR | Status: DC | PRN
  Administered 2023-08-11 – 2023-08-12 (×3): 2 mg via INTRAVENOUS
  Filled 2023-08-11 (×3): qty 1

## 2023-08-11 MED ORDER — ENOXAPARIN SODIUM 40 MG/0.4ML IJ SOSY
40.0000 mg | PREFILLED_SYRINGE | Freq: Every evening | INTRAMUSCULAR | Status: DC
  Filled 2023-08-11 (×2): qty 0.4

## 2023-08-11 MED ORDER — TAMSULOSIN HCL 0.4 MG PO CAPS
0.4000 mg | ORAL_CAPSULE | Freq: Every day | ORAL | Status: DC
  Administered 2023-08-12: 0.4 mg via ORAL
  Filled 2023-08-11 (×2): qty 1

## 2023-08-11 NOTE — ED Notes (Signed)
Pt refuses vitals at this time. Sitter is at bedside. Pt resp are even and unlabored. Skin is dry, warm, and appropriate for ethnicity. Pt is resting comfortably.

## 2023-08-11 NOTE — Assessment & Plan Note (Signed)
Suspect patient needs a neurology evaluation and formal neuropsych evaluation.

## 2023-08-11 NOTE — ED Notes (Signed)
Pt continues to sleep. Pt was given several several sedation meds last night, see notes. Pt is noncompliant with PO meds and ripped out several Ivs last night and tried to eat IV tubing. Pt has sitter. Current status has been communicated to Dr Lucianne Muss.

## 2023-08-11 NOTE — Progress Notes (Signed)
Pharmacy Antibiotic Note  Ian Medina is a 67 y.o. male admitted on 08/10/2023 with UTI, ESBL.  Pharmacy has been consulted for Meropenem dosing.  Plan: Meropenem 1 gm IV X 1 given in ED on 9/13 @ 1937. Meropenem 1 gm IV Q8H ordered to start on 9/14 @ 0330.   Weight: 64.5 kg (142 lb 3.2 oz)  Temp (24hrs), Avg:98.3 F (36.8 C), Min:98 F (36.7 C), Max:98.6 F (37 C)  Recent Labs  Lab 08/10/23 1507 08/10/23 1639  WBC 10.6*  --   CREATININE 0.73  --   LATICACIDVEN  --  1.4    Estimated Creatinine Clearance: 84 mL/min (by C-G formula based on SCr of 0.73 mg/dL).    Allergies  Allergen Reactions   Propoxyphene Rash    Antimicrobials this admission:   >>    >>   Dose adjustments this admission:   Microbiology results:  BCx:   UCx:   Sputum:    MRSA PCR:   Thank you for allowing pharmacy to be a part of this patient's care.  Shyniece Scripter D 08/11/2023 2:26 AM

## 2023-08-11 NOTE — Assessment & Plan Note (Signed)
Suspect lactic acidosis from hypoperfusion leading to hypovolemia from his low blood pressure.

## 2023-08-11 NOTE — Assessment & Plan Note (Signed)
Patient has required Haldol and has been restless agitated uncooperative needs frequent redirection.  Cooperates with me and allows me to examine him but is nevertheless confused and disoriented.  As needed Haldol low-dose.  EKG as needed for QT monitoring.

## 2023-08-11 NOTE — Assessment & Plan Note (Signed)
IV PPI therapy, aspiration precautions.

## 2023-08-11 NOTE — Assessment & Plan Note (Signed)
Monitor levels and replace as needed.

## 2023-08-11 NOTE — Assessment & Plan Note (Signed)
Patient has history of ESBL UTI.  Ian Medina was given meropenem in the emergency room and will continue the same.

## 2023-08-11 NOTE — Assessment & Plan Note (Signed)
Will continue patient on Prozac and hydroxyzine.

## 2023-08-11 NOTE — Assessment & Plan Note (Signed)
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  Will consider starting patient on low-dose Seroquel.

## 2023-08-11 NOTE — ED Notes (Signed)
RN and sitter attempted to get VS and another IV on pt. Pt was verbally inappropriate and tried to Engineer, petroleum. MD aware.

## 2023-08-11 NOTE — Assessment & Plan Note (Signed)
Unclear if this was septic shock like presentation or hypovolemia due to GI losses as patient is a poor historian or hypovolemia due to poor p.o. intake or hypovolemia due to GI losses again secondary to COVID which he was diagnosed on.  Regardless we will continue to hydrate patient with electrolyte management neurochecks aspiration fall and seizure precautions.  Neurology consult as deemed appropriate.

## 2023-08-11 NOTE — Plan of Care (Signed)
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
## 2023-08-11 NOTE — Progress Notes (Signed)
[facility] Progress Note  Patient: Ian Medina    ZOX:[id number]  DOA: 08/10/2023     Date of Service: the patient was seen and examined on 08/11/2023  Chief Complaint  Patient presents with   Hypotension   Brief hospital course: Ian Medina is a 67 y.o. male with PMH of HTN, HLD, middle cerebral artery aneurysm, nonruptured, COPD, GERD, Hep C, schizophrenia, paranoid, auditory hallucinations,  generalized anxiety disorder, as reviewed from EMR, came via EMS from [facility] with complaining of not acting normal.  Patient was hypotensive with EMS.  BP 80/50, after IV fluid BP 96/61.  Patient did fell but no injury on 08/09/23.  Patient presented with acute metabolic encephalopathy.  Patient was tested positive for COVID at the facility.  During last admission patient had ESBL E. coli UTI which was treated with IV antibiotics and 7 days of p.o. Augmentin was prescribed on discharge on 07/04/2023. In the ED patient was septic due to COVID infection, hypotensive and tachycardic.  Patient was given IV fluid bolus and due to COVID patient was started on Paxlovid.   Chest x-ray negative for consolidation, head CT negative for bleed, CT C-spine negative for any fracture or dislocation. Whole night patient was very agitated and restless, pulling IV lines and was uncooperative.  Patient was given Versed and other medications, finally in the morning patient was sleeping.  Assessment and Plan: # Acute metabolic encephalopathy most likely secondary to COVID viral infection Continue supportive care Continue fall precautions  # Sepsis secondary to COVID viral infection Continue Paxlovid Continue IV fluid as per sepsis protocol Lactic acid within normal range Vital signs improving Continue midodrine 10 mg p.o. 3 times daily, follow parameters.  # History of ESBL E. coli UTI, patient was treated with antibiotics recently. Due to sepsis patient was started on meropenem in the ED  which has been continued Check UA and urine culture, De-escalate antibiotics accordingly Follow-up blood cultures  # Hypokalemia, potassium repleted. Monitor electrolytes and replete as needed.  # Schizophrenia, history of auditory hallucinations, paranoid and depression Resumed Prozac 30 mg p.o. daily, Zyprexa 10 mg p.o. twice daily, Ingrezza 40 mg p.o. daily, trazodone 50 mg nightly, Cogentin 0.5 mg p.o. twice daily  # History of seizures, continue gabapentin 100 mg p.o. 3 times daily Switch to Keppra at from oral to IV 500 mg twice daily because patient is sometimes refusing oral meds.  We will change Keppra to oral when patient will be willing to take oral meds.   # History of chronic hypertension Continue midodrine and Florinef Monitor BP and titrate medications accordingly  # COPD, stable, continue inhalers.  And to continue Singulair.  # GERD # BPH, continue Proscar, started Flomax  Body mass index is 19.29 kg/m.  Interventions:  Diet: Regular diet DVT Prophylaxis: Subcutaneous Lovenox   Advance goals of care discussion: Full code  Family Communication: family was not present at bedside, at the time of interview.  The pt provided permission to discuss medical plan with the family. Opportunity was given to ask question and all questions were answered satisfactorily.   Disposition:  Pt is from [facility], admitted with AMS, sepsis and covid infection, still has AMS and on IV abx, which precludes a safe discharge. Discharge to [facility], when stable, may need 1-2 days stay.  Subjective: Overnight patient was very altered, pulled IV lines and was not cooperative.  In the morning time patient was sleepy.  On my interaction  patient is AAO x 1, stated that he is feeling better, denied any specific complaints.  He is not aware where is he right now and what happened to him.  Patient was redirectable.  Patient was counseled to cooperate and  take his medications to make him better.  Physical Exam: General: NAD, lying comfortably Appear in no distress, affect appropriate Eyes: PERRLA ENT: Oral Mucosa Clear, moist  Neck: no JVD,  Cardiovascular: S1 and S2 Present, no Murmur,  Respiratory: good respiratory effort, Bilateral Air entry equal and Decreased, no Crackles, no wheezes Abdomen: Bowel Sound present, Soft and no tenderness,  Skin: no rashes Extremities: no Pedal edema, no calf tenderness Neurologic: without any new focal findings Gait not checked due to patient safety concerns  Vitals:   08/10/23 1940 08/10/23 2334 08/11/23 0923 08/11/23 0926  BP: 101/60   95/76  Pulse: 86  75   Resp: 18  20   Temp:  98.4 F (36.9 C) 98.9 F (37.2 C)   TempSrc:  Oral Axillary   SpO2: 100%  94%   Weight:        Intake/Output Summary (Last 24 hours) at 08/11/2023 1240 Last data filed at 08/11/2023 0001 Gross per 24 hour  Intake 100 ml  Output --  Net 100 ml   Filed Weights   08/10/23 1900  Weight: 64.5 kg    Data Reviewed: I have personally reviewed and interpreted daily labs, tele strips, imagings as discussed above. I reviewed all nursing notes, pharmacy notes, vitals, pertinent old records I have discussed plan of care as described above with RN and patient/family.  CBC: Recent Labs  Lab 08/10/23 1507 08/11/23 1029  WBC 10.6* 7.9  HGB 11.5* 10.9*  HCT 35.5* 33.4*  MCV 96.2 96.3  PLT 222 189   Basic Metabolic Panel: Recent Labs  Lab 08/10/23 1507 08/11/23 1029  NA 141 139  K 3.4* 3.2*  CL 108 106  CO2 22 24  GLUCOSE 99 84  BUN 15 11  CREATININE 0.73 0.62  CALCIUM 8.9 8.5*    Studies: CT Head Wo Contrast  Result Date: 08/10/2023 CLINICAL DATA:  Altered mental status, recent fall, hypotensive EXAM: CT HEAD WITHOUT CONTRAST CT CERVICAL SPINE WITHOUT CONTRAST TECHNIQUE: Multidetector CT imaging of the head and cervical spine was performed following the standard protocol without intravenous  contrast. Multiplanar CT image reconstructions of the cervical spine were also generated. RADIATION DOSE REDUCTION: This exam was performed according to the departmental dose-optimization program which includes automated exposure control, adjustment of the mA and/or kV according to patient size and/or use of iterative reconstruction technique. COMPARISON:  04/27/2023 CT head and cervical spine FINDINGS: CT HEAD FINDINGS Brain: No evidence of acute infarct, hemorrhage, mass, mass effect, or midline shift. No hydrocephalus or extra-axial fluid collection. Age related cerebral atrophy. Vascular: No hyperdense vessel. Vascular coils in the proximal left MCA. Skull: Negative for fracture or focal lesion. Sinuses/Orbits: Mucosal thickening in the right maxillary sinus and ethmoid air cells. No acute finding in the orbits. Other: The mastoid air cells are well aerated. CT CERVICAL SPINE FINDINGS Alignment: No traumatic listhesis. Straightening and reversal of the normal cervical lordosis. Skull base and vertebrae: No acute fracture or suspicious osseous lesion. Soft tissues and spinal canal: No prevertebral fluid or swelling. No visible canal hematoma. Disc levels: Degenerative changes in the cervical spine. No high-grade spinal canal stenosis. Left-greater-than-right facet arthropathy. Upper chest: Negative. IMPRESSION: 1. No acute intracranial process. 2. No acute fracture or traumatic listhesis in  the cervical spine. Electronically Signed   By: Wiliam Ke M.D.   On: 08/10/2023 18:32   CT Cervical Spine Wo Contrast  Result Date: 08/10/2023 CLINICAL DATA:  Altered mental status, recent fall, hypotensive EXAM: CT HEAD WITHOUT CONTRAST CT CERVICAL SPINE WITHOUT CONTRAST TECHNIQUE: Multidetector CT imaging of the head and cervical spine was performed following the standard protocol without intravenous contrast. Multiplanar CT image reconstructions of the cervical spine were also generated. RADIATION DOSE REDUCTION:  This exam was performed according to the departmental dose-optimization program which includes automated exposure control, adjustment of the mA and/or kV according to patient size and/or use of iterative reconstruction technique. COMPARISON:  04/27/2023 CT head and cervical spine FINDINGS: CT HEAD FINDINGS Brain: No evidence of acute infarct, hemorrhage, mass, mass effect, or midline shift. No hydrocephalus or extra-axial fluid collection. Age related cerebral atrophy. Vascular: No hyperdense vessel. Vascular coils in the proximal left MCA. Skull: Negative for fracture or focal lesion. Sinuses/Orbits: Mucosal thickening in the right maxillary sinus and ethmoid air cells. No acute finding in the orbits. Other: The mastoid air cells are well aerated. CT CERVICAL SPINE FINDINGS Alignment: No traumatic listhesis. Straightening and reversal of the normal cervical lordosis. Skull base and vertebrae: No acute fracture or suspicious osseous lesion. Soft tissues and spinal canal: No prevertebral fluid or swelling. No visible canal hematoma. Disc levels: Degenerative changes in the cervical spine. No high-grade spinal canal stenosis. Left-greater-than-right facet arthropathy. Upper chest: Negative. IMPRESSION: 1. No acute intracranial process. 2. No acute fracture or traumatic listhesis in the cervical spine. Electronically Signed   By: Wiliam Ke M.D.   On: 08/10/2023 18:32   DG Chest Port 1 View  Result Date: 08/10/2023 CLINICAL DATA:  Hypotension, altered level of consciousness EXAM: PORTABLE CHEST 1 VIEW COMPARISON:  06/26/2023 FINDINGS: 2 frontal views of the chest demonstrate a stable cardiac silhouette. Continued ectasia of the thoracic aorta. No acute airspace disease, effusion, or pneumothorax. Continued background parenchymal scarring, basilar predominant. No effusion or pneumothorax. No acute bony abnormalities. IMPRESSION: 1. Chronic background scarring.  No acute airspace disease. Electronically Signed    By: Sharlet Salina M.D.   On: 08/10/2023 17:12    Scheduled Meds:  aspirin  81 mg Oral Daily   benztropine  0.5 mg Oral BID   finasteride  5 mg Oral Daily   fludrocortisone  0.2 mg Oral Daily   FLUoxetine  30 mg Oral Daily   fluticasone furoate-vilanterol  1 puff Inhalation Daily   And   umeclidinium bromide  1 puff Inhalation Daily   gabapentin  100 mg Oral TID   heparin  5,000 Units Subcutaneous Q12H   megestrol  400 mg Oral Daily   midodrine  10 mg Oral TID WC   montelukast  10 mg Oral QHS   nicotine  21 mg Transdermal Daily   nirmatrelvir/ritonavir  3 tablet Oral BID   OLANZapine  10 mg Oral BID   pravastatin  20 mg Oral q1800   sodium chloride flush  3 mL Intravenous Q12H   traZODone  50 mg Oral QHS   valbenazine  40 mg Oral Daily   Continuous Infusions:  lactated ringers 50 mL/hr at 08/11/23 1017   levETIRAcetam     meropenem (MERREM) IV 1 g (08/11/23 1055)   PRN Meds: acetaminophen **OR** acetaminophen, haloperidol lactate, HYDROcodone-acetaminophen, hydrOXYzine, LORazepam, morphine injection  Time spent: 35 minutes  Author: Gillis Santa. MD [facility] 08/11/2023 12:40 PM  To reach On-call, see care teams to  locate the attending and reach out to them via [email or web address]. If 7PM-7AM, please contact night-coverage If you still have difficulty reaching the attending provider, please page the [facility] (Director on Call) for [facility] on amion for assistance.

## 2023-08-11 NOTE — Assessment & Plan Note (Signed)
Will replace and follow levels.

## 2023-08-11 NOTE — ED Notes (Signed)
Sitter at bedside with pt.

## 2023-08-11 NOTE — ED Notes (Signed)
Sitter at bedside.

## 2023-08-11 NOTE — ED Notes (Signed)
Sitter attempted VS and pt cursed at sitter refusing vitals. Sitter informed Charity fundraiser. RN informed provider of refusal.

## 2023-08-12 DIAGNOSIS — I959 Hypotension, unspecified: Secondary | ICD-10-CM | POA: Diagnosis not present

## 2023-08-12 LAB — CBC
HCT: 30.6 % — ABNORMAL LOW (ref 39.0–52.0)
Hemoglobin: 10 g/dL — ABNORMAL LOW (ref 13.0–17.0)
MCH: 31.5 pg (ref 26.0–34.0)
MCHC: 32.7 g/dL (ref 30.0–36.0)
MCV: 96.5 fL (ref 80.0–100.0)
Platelets: 171 10*3/uL (ref 150–400)
RBC: 3.17 MIL/uL — ABNORMAL LOW (ref 4.22–5.81)
RDW: 14.8 % (ref 11.5–15.5)
WBC: 7.4 10*3/uL (ref 4.0–10.5)
nRBC: 0 % (ref 0.0–0.2)

## 2023-08-12 LAB — BASIC METABOLIC PANEL
Anion gap: 8 (ref 5–15)
BUN: 16 mg/dL (ref 8–23)
CO2: 24 mmol/L (ref 22–32)
Calcium: 8.5 mg/dL — ABNORMAL LOW (ref 8.9–10.3)
Chloride: 112 mmol/L — ABNORMAL HIGH (ref 98–111)
Creatinine, Ser: 0.77 mg/dL (ref 0.61–1.24)
GFR, Estimated: >60 mL/min (ref >60)
Glucose, Bld: 98 mg/dL (ref 70–99)
Potassium: 3.9 mmol/L (ref 3.5–5.1)
Sodium: 144 mmol/L (ref 135–145)

## 2023-08-12 LAB — PHOSPHORUS: Phosphorus: 3.7 mg/dL (ref 2.5–4.6)

## 2023-08-12 LAB — MAGNESIUM: Magnesium: 2.4 mg/dL (ref 1.7–2.4)

## 2023-08-12 NOTE — Plan of Care (Signed)

## 2023-08-12 NOTE — Progress Notes (Signed)
[facility] Progress Note  Patient: Ian Medina    WUJ:[id number]  DOA: 08/10/2023     Date of Service: the patient was seen and examined on 08/12/2023  Chief Complaint  Patient presents with   Hypotension   Brief hospital course: Ian Medina is a 67 y.o. male with PMH of HTN, HLD, middle cerebral artery aneurysm, nonruptured, COPD, GERD, Hep C, schizophrenia, paranoid, auditory hallucinations,  generalized anxiety disorder, as reviewed from EMR, came via EMS from [facility] with complaining of not acting normal.  Patient was hypotensive with EMS.  BP 80/50, after IV fluid BP 96/61.  Patient did fell but no injury on 08/09/23.  Patient presented with acute metabolic encephalopathy.  Patient was tested positive for COVID at the facility.  During last admission patient had ESBL E. coli UTI which was treated with IV antibiotics and 7 days of p.o. Augmentin was prescribed on discharge on 07/04/2023. In the ED patient was septic due to COVID infection, hypotensive and tachycardic.  Patient was given IV fluid bolus and due to COVID patient was started on Paxlovid.   Chest x-ray negative for consolidation, head CT negative for bleed, CT C-spine negative for any fracture or dislocation. Whole night patient was very agitated and restless, pulling IV lines and was uncooperative.  Patient was given Versed and other medications, finally in the morning patient was sleeping.  Assessment and Plan: # Acute metabolic encephalopathy most likely secondary to COVID viral infection Continue supportive care Continue fall precautions  # Sepsis secondary to COVID viral infection Continue Paxlovid S/p IV fluid as per sepsis protocol Lactic acid within normal range Vital signs improving Continue midodrine 10 mg p.o. 3 times daily, follow parameters.  # History of ESBL E. coli UTI, patient was treated with antibiotics recently. Due to sepsis patient was started on meropenem in the ED which has  been continued Check UA and urine culture, sample could not be collected, patient was not cooperative. De-escalate antibiotics accordingly Follow-up blood cultures  # Hypokalemia, potassium repleted.  Resolved Monitor electrolytes and replete as needed.  # Schizophrenia, history of auditory hallucinations, paranoid and depression Resumed Prozac 30 mg p.o. daily, Zyprexa 10 mg p.o. twice daily, Ingrezza 40 mg p.o. daily, trazodone 50 mg nightly, Cogentin 0.5 mg p.o. twice daily  # History of seizures, continue gabapentin 100 mg p.o. 3 times daily Switch to Keppra at from oral to IV 500 mg twice daily because patient is sometimes refusing oral meds.  We will change Keppra to oral when patient will be willing to take oral meds.   # History of chronic hypertension Continue midodrine and Florinef Monitor BP and titrate medications accordingly  # COPD, stable, continue inhalers.  And to continue Singulair.  # BPH, continue Proscar, started Flomax  Body mass index is 18.21 kg/m.  Interventions:  Diet: Regular diet DVT Prophylaxis: Subcutaneous Lovenox   Advance goals of care discussion: Full code  Family Communication: family was not present at bedside, at the time of interview.  The pt provided permission to discuss medical plan with the family. Opportunity was given to ask question and all questions were answered satisfactorily.   Disposition:  Pt is from [facility], admitted with AMS, sepsis and covid infection, still has AMS and on IV abx, which precludes a safe discharge. Discharge to [facility], when stable, may need 1-2 days stay.  Subjective: No significant overnight events, the morning time patient was more awake and alert, he was  eating breakfast.  Cooperative and no MPFL issues today.  Last night patient was trying to get out of the bed and walk but he was redirected as per RN.   Physical Exam: General: NAD, lying comfortably Appear  in no distress, affect appropriate Eyes: PERRLA ENT: Oral Mucosa Clear, moist  Neck: no JVD,  Cardiovascular: S1 and S2 Present, no Murmur,  Respiratory: good respiratory effort, Bilateral Air entry equal and Decreased, no Crackles, no wheezes Abdomen: Bowel Sound present, Soft and no tenderness,  Skin: no rashes Extremities: no Pedal edema, no calf tenderness Neurologic: without any new focal findings Gait not checked due to patient safety concerns  Vitals:   08/11/23 0926 08/11/23 1351 08/11/23 1701 08/12/23 0623  BP: 95/76  96/60 101/63  Pulse:   81 64  Resp:   16 16  Temp:   99.7 F (37.6 C)   TempSrc:      SpO2:   95% 100%  Weight:  64.5 kg  60.9 kg  Height:  6' (1.829 m)      Intake/Output Summary (Last 24 hours) at 08/12/2023 1236 Last data filed at 08/12/2023 0300 Gross per 24 hour  Intake 131.07 ml  Output --  Net 131.07 ml   Filed Weights   08/10/23 1900 08/11/23 1351 08/12/23 0623  Weight: 64.5 kg 64.5 kg 60.9 kg    Data Reviewed: I have personally reviewed and interpreted daily labs, tele strips, imagings as discussed above. I reviewed all nursing notes, pharmacy notes, vitals, pertinent old records I have discussed plan of care as described above with RN and patient/family.  CBC: Recent Labs  Lab 08/10/23 1507 08/11/23 1029 08/12/23 0548  WBC 10.6* 7.9 7.4  HGB 11.5* 10.9* 10.0*  HCT 35.5* 33.4* 30.6*  MCV 96.2 96.3 96.5  PLT 222 189 171   Basic Metabolic Panel: Recent Labs  Lab 08/10/23 1507 08/11/23 1029 08/12/23 0548  NA 141 139 144  K 3.4* 3.2* 3.9  CL 108 106 112*  CO2 22 24 24   GLUCOSE 99 84 98  BUN 15 11 16   CREATININE 0.73 0.62 0.77  CALCIUM 8.9 8.5* 8.5*  MG  --   --  2.4  PHOS  --   --  3.7    Studies: No results found.  Scheduled Meds:  aspirin  81 mg Oral Daily   benztropine  0.5 mg Oral BID   enoxaparin (LOVENOX) injection  40 mg Subcutaneous QPM   finasteride  5 mg Oral Daily   fludrocortisone  0.2 mg Oral Daily    FLUoxetine  30 mg Oral Daily   fluticasone furoate-vilanterol  1 puff Inhalation Daily   And   umeclidinium bromide  1 puff Inhalation Daily   gabapentin  100 mg Oral TID   megestrol  400 mg Oral Daily   midodrine  10 mg Oral TID WC   montelukast  10 mg Oral QHS   nicotine  21 mg Transdermal Daily   nirmatrelvir/ritonavir  3 tablet Oral BID   OLANZapine  10 mg Oral BID   pravastatin  20 mg Oral q1800   sodium chloride flush  3 mL Intravenous Q12H   tamsulosin  0.4 mg Oral QPC supper   traZODone  50 mg Oral QHS   valbenazine  40 mg Oral Daily   Continuous Infusions:  lactated ringers 50 mL/hr at 08/11/23 1017   levETIRAcetam 500 mg (08/12/23 1009)   meropenem (MERREM) IV 1 g (08/12/23 1144)   PRN Meds: acetaminophen **OR** acetaminophen,  haloperidol lactate, HYDROcodone-acetaminophen, hydrOXYzine, LORazepam, morphine injection  Time spent: 35 minutes  Author: Gillis Santa. MD [facility] 08/12/2023 12:36 PM  To reach On-call, see care teams to locate the attending and reach out to them via [email or web address]. If 7PM-7AM, please contact night-coverage If you still have difficulty reaching the attending provider, please page the [facility] (Director on Call) for [facility] on amion for assistance.

## 2023-08-13 DIAGNOSIS — U071 COVID-19: Secondary | ICD-10-CM | POA: Diagnosis not present

## 2023-08-13 DIAGNOSIS — I959 Hypotension, unspecified: Secondary | ICD-10-CM | POA: Diagnosis not present

## 2023-08-13 DIAGNOSIS — G9341 Metabolic encephalopathy: Secondary | ICD-10-CM | POA: Diagnosis not present

## 2023-08-13 LAB — BASIC METABOLIC PANEL
Anion gap: 6 (ref 5–15)
BUN: 17 mg/dL (ref 8–23)
CO2: 24 mmol/L (ref 22–32)
Calcium: 8.1 mg/dL — ABNORMAL LOW (ref 8.9–10.3)
Chloride: 109 mmol/L (ref 98–111)
Creatinine, Ser: 0.68 mg/dL (ref 0.61–1.24)
GFR, Estimated: >60 mL/min (ref >60)
Glucose, Bld: 101 mg/dL — ABNORMAL HIGH (ref 70–99)
Potassium: 3.2 mmol/L — ABNORMAL LOW (ref 3.5–5.1)
Sodium: 139 mmol/L (ref 135–145)

## 2023-08-13 LAB — CBC
HCT: 29.6 % — ABNORMAL LOW (ref 39.0–52.0)
Hemoglobin: 9.8 g/dL — ABNORMAL LOW (ref 13.0–17.0)
MCH: 31.4 pg (ref 26.0–34.0)
MCHC: 33.1 g/dL (ref 30.0–36.0)
MCV: 94.9 fL (ref 80.0–100.0)
Platelets: 167 10*3/uL (ref 150–400)
RBC: 3.12 MIL/uL — ABNORMAL LOW (ref 4.22–5.81)
RDW: 14.6 % (ref 11.5–15.5)
WBC: 7.1 10*3/uL (ref 4.0–10.5)
nRBC: 0 % (ref 0.0–0.2)

## 2023-08-13 LAB — PHOSPHORUS: Phosphorus: 3.4 mg/dL (ref 2.5–4.6)

## 2023-08-13 LAB — MAGNESIUM: Magnesium: 2.1 mg/dL (ref 1.7–2.4)

## 2023-08-13 MED ORDER — SULFAMETHOXAZOLE-TRIMETHOPRIM 800-160 MG PO TABS
1.0000 | ORAL_TABLET | Freq: Two times a day (BID) | ORAL | Status: DC
  Administered 2023-08-13: 1 via ORAL
  Filled 2023-08-13: qty 1

## 2023-08-13 MED ORDER — SULFAMETHOXAZOLE-TRIMETHOPRIM 800-160 MG PO TABS: 1.0000 | ORAL_TABLET | Freq: Two times a day (BID) | ORAL | Status: AC

## 2023-08-13 MED ORDER — TAMSULOSIN HCL 0.4 MG PO CAPS: 0.4000 mg | ORAL_CAPSULE | Freq: Every day | ORAL | Status: AC

## 2023-08-13 MED ORDER — NIRMATRELVIR/RITONAVIR (PAXLOVID)TABLET: 3.0000 | ORAL_TABLET | Freq: Two times a day (BID) | ORAL | Status: AC

## 2023-08-13 MED ORDER — POTASSIUM CHLORIDE 20 MEQ PO PACK
40.0000 meq | PACK | ORAL | Status: DC
  Administered 2023-08-13: 40 meq via ORAL
  Filled 2023-08-13: qty 2

## 2023-08-13 MED ORDER — SULFAMETHOXAZOLE-TRIMETHOPRIM 800-160 MG PO TABS: 1.0000 | ORAL_TABLET | Freq: Two times a day (BID) | ORAL | Status: DC

## 2023-08-13 NOTE — [place]2 (Signed)
[place] MEDICAID [place]2 LEVEL OF CARE FORM     IDENTIFICATION  Patient Name: Ian Medina Birthdate: 19-Jan-1956 Sex: male Admission Date (Current Location): 08/10/2023  [facility] and [place] Number:  Chiropodist and Address:  [facility], [address] [place], [place] [address]      Provider Number: [id number]  Attending Physician Name and Address:  Gillis Santa, MD  Relative Name and Phone Number:  [name] Legal Guardian [phone]    Current Level of Care: Hospital Recommended Level of Care: Skilled Nursing Facility Prior Approval Number:    Date Approved/Denied:   PASRR Number:    Discharge Plan: Other (Comment) ([facility]illed Nursing Long Term Care)    Current Diagnoses: Patient Active Problem List   Diagnosis Date Noted   Restlessness and agitation 08/10/2023   Reactive thrombocytosis 07/04/2023   Hypomagnesemia 07/01/2023   Anemia of chronic disease 06/28/2023   Lactic acidosis 06/27/2023   Abnormal ultrasound of liver 03/19/2023   History of hepatitis C 12/17/2022   Liver fibrosis 12/17/2022   Constipation 12/17/2022   Hypotension 10/31/2022   Urinary tract infection due to extended-spectrum beta lactamase (ESBL) producing Escherichia coli 10/29/2022   Aspiration pneumonia (HCC) 10/28/2022   Hypokalemia 10/28/2022   Schizoaffective disorder, bipolar type (HCC) 10/27/2022   Protein-calorie malnutrition, severe 10/26/2022   Loss of weight 10/26/2022   Acute metabolic encephalopathy 10/24/2022   GERD (gastroesophageal reflux disease) 10/24/2022   Urinary retention due to benign prostatic hyperplasia 10/24/2022   Severe sepsis (HCC) 09/25/2022   Sepsis due to pneumonia (HCC) 09/24/2022   Schizoaffective disorder, depressive type (HCC)    Falls    Multiple rib fractures    Aggressive behavior 06/03/2022   Cognitive decline 06/08/2014   Dysphagia 05/11/2014   Encounter for screening colonoscopy 03/12/2013     Orientation RESPIRATION BLADDER Height & Weight     Self, Time, Place  Normal Incontinent Weight: 60.9 kg Height:  6' (182.9 cm)  BEHAVIORAL SYMPTOMS/MOOD NEUROLOGICAL BOWEL NUTRITION STATUS      Continent Diet (See DC summary)  AMBULATORY STATUS COMMUNICATION OF NEEDS Skin   Extensive Assist Verbally Normal                       Personal Care Assistance Level of Assistance  Bathing, Feeding, Dressing Bathing Assistance: Maximum assistance Feeding assistance: Limited assistance Dressing Assistance: Maximum assistance     Functional Limitations Info  Sight, Hearing, Speech Sight Info: Impaired Hearing Info: Impaired Speech Info: Adequate    SPECIAL CARE FACTORS FREQUENCY                       Contractures Contractures Info: Not present    Additional Factors Info  Code Status, Allergies Code Status Info: Full code Allergies Info: propoxyphene           Current Medications (08/13/2023):  This is the current hospital active medication list Current Facility-Administered Medications  Medication Dose Route Frequency Provider Last Rate Last Admin   acetaminophen (TYLENOL) tablet 650 mg  650 mg Oral Q6H PRN Gertha Calkin, MD   650 mg at 08/13/23 0115   Or   acetaminophen (TYLENOL) suppository 650 mg  650 mg Rectal Q6H PRN Gertha Calkin, MD       aspirin chewable tablet 81 mg  81 mg Oral Daily Gertha Calkin, MD   81 mg at 08/13/23 1034   benztropine (COGENTIN) tablet 0.5 mg  0.5 mg Oral  BID Irena Cords V, MD   0.5 mg at 08/13/23 1039   enoxaparin (LOVENOX) injection 40 mg  40 mg Subcutaneous QPM Gillis Santa, MD       finasteride (PROSCAR) tablet 5 mg  5 mg Oral Daily Irena Cords V, MD   5 mg at 08/13/23 1034   fludrocortisone (FLORINEF) tablet 0.2 mg  0.2 mg Oral Daily Irena Cords V, MD   0.2 mg at 08/13/23 1034   FLUoxetine (PROZAC) capsule 30 mg  30 mg Oral Daily Irena Cords V, MD   30 mg at 08/13/23 1038   fluticasone furoate-vilanterol (BREO ELLIPTA)  100-25 MCG/ACT 1 puff  1 puff Inhalation Daily Gertha Calkin, MD   1 puff at 08/13/23 1041   And   umeclidinium bromide (INCRUSE ELLIPTA) 62.5 MCG/ACT 1 puff  1 puff Inhalation Daily Gertha Calkin, MD   1 puff at 08/13/23 1041   gabapentin (NEURONTIN) capsule 100 mg  100 mg Oral TID Gertha Calkin, MD   100 mg at 08/13/23 1034   haloperidol lactate (HALDOL) injection 2 mg  2 mg Intravenous Q6H PRN Gillis Santa, MD   2 mg at 08/12/23 2111   HYDROcodone-acetaminophen (NORCO/VICODIN) 5-325 MG per tablet 1 tablet  1 tablet Oral Q6H PRN Gertha Calkin, MD   1 tablet at 08/13/23 0128   hydrOXYzine (ATARAX) tablet 50 mg  50 mg Oral Q8H PRN Gertha Calkin, MD       lactated ringers infusion   Intravenous Continuous Gertha Calkin, MD 50 mL/hr at 08/13/23 1100 New Bag at 08/13/23 1100   levETIRAcetam (KEPPRA) IVPB 500 mg/100 mL premix  500 mg Intravenous Q12H Gillis Santa, MD 400 mL/hr at 08/13/23 0930 500 mg at 08/13/23 0930   LORazepam (ATIVAN) tablet 1 mg  1 mg Oral Q8H PRN Gertha Calkin, MD   1 mg at 08/12/23 2344   megestrol (MEGACE) 400 MG/10ML suspension 400 mg  400 mg Oral Daily Irena Cords V, MD   400 mg at 08/13/23 1035   meropenem (MERREM) 1 g in sodium chloride 0.9 % 100 mL IVPB  1 g Intravenous Q8H Irena Cords V, MD 200 mL/hr at 08/13/23 1104 1 g at 08/13/23 1104   midodrine (PROAMATINE) tablet 10 mg  10 mg Oral TID WC Irena Cords V, MD   10 mg at 08/13/23 1254   montelukast (SINGULAIR) tablet 10 mg  10 mg Oral QHS Irena Cords V, MD   10 mg at 08/12/23 2111   morphine (PF) 2 MG/ML injection 2 mg  2 mg Intravenous Q4H PRN Gertha Calkin, MD       nicotine (NICODERM CQ - dosed in mg/24 hours) patch 21 mg  21 mg Transdermal Daily Gertha Calkin, MD   21 mg at 08/13/23 1015   nirmatrelvir/ritonavir (PAXLOVID) 3 tablet  3 tablet Oral BID Gertha Calkin, MD   3 tablet at 08/13/23 1037   OLANZapine (ZYPREXA) tablet 10 mg  10 mg Oral BID Irena Cords V, MD   10 mg at 08/13/23 1036   pravastatin (PRAVACHOL)  tablet 20 mg  20 mg Oral q1800 Irena Cords V, MD   20 mg at 08/12/23 2110   sodium chloride flush (NS) 0.9 % injection 3 mL  3 mL Intravenous Q12H Irena Cords V, MD   3 mL at 08/13/23 1038   tamsulosin (FLOMAX) capsule 0.4 mg  0.4 mg Oral QPC supper Gillis Santa, MD   0.4 mg at 08/12/23  2112   traZODone (DESYREL) tablet 50 mg  50 mg Oral QHS Gertha Calkin, MD   50 mg at 08/12/23 2112   valbenazine (INGREZZA) capsule 40 mg  40 mg Oral Daily Gertha Calkin, MD   40 mg at 08/13/23 1036     Discharge Medications: Please see discharge summary for a list of discharge medications.  Relevant Imaging Results:  Relevant Lab Results:   Additional Information SS#: [id number]  Marlowe Sax, RN

## 2023-08-13 NOTE — Discharge Summary (Signed)
[facility] Discharge Summary   Patient: Ian Medina ZOX:[id number]  PCP: Pcp, No  Date of admission: 08/10/2023   Date of discharge:  08/13/2023     Discharge Diagnoses:  Principal Problem:   Hypotension Active Problems:   Aggressive behavior   Acute metabolic encephalopathy   Urinary tract infection due to extended-spectrum beta lactamase (ESBL) producing Escherichia coli   Lactic acidosis   Schizoaffective disorder, depressive type (HCC)   GERD (gastroesophageal reflux disease)   Hypokalemia   Hypomagnesemia   Restlessness and agitation   Admitted From: [facility] Disposition:  SNF   Recommendations for Outpatient Follow-up:  Follow-up with PCP, patient should be seen by an MD in 1 to 2 days, monitor vitals and titrate medication accordingly Follow up LABS/TEST:  CBC and BMP in 1 wk   Contact information for after-discharge care     Destination     [facility] .   Service: Skilled Nursing Contact information: [address] [place] [address] [phone]                    Diet recommendation: Regular diet  Activity: The patient is advised to gradually reintroduce usual activities, as tolerated  Discharge Condition: stable  Code Status: Full code   History of present illness: As per the H and P dictated on admission  Hospital Course:  Ian Medina is a 67 y.o. male with PMH of HTN, HLD, middle cerebral artery aneurysm, nonruptured, COPD, GERD, Hep C, schizophrenia, paranoid, auditory hallucinations,  generalized anxiety disorder, as reviewed from EMR, came via EMS from [facility] with complaining of not acting normal.  Patient was hypotensive with EMS.  BP 80/50, after IV fluid BP 96/61.  Patient did fell but no injury on 08/09/23.  Patient presented with acute metabolic encephalopathy.  Patient was tested positive for COVID at the facility.  During last admission patient had ESBL E.  coli UTI which was treated with IV antibiotics and 7 days of p.o. Augmentin was prescribed on discharge on 07/04/2023. In the ED patient was septic due to COVID infection, hypotensive and tachycardic.  Patient was given IV fluid bolus and due to COVID patient was started on Paxlovid.   Chest x-ray negative for consolidation, head CT negative for bleed, CT C-spine negative for any fracture or dislocation. Whole night patient was very agitated and restless, pulling IV lines and was uncooperative.  Patient was given Versed and other medications, finally in the morning patient was sleeping.   Assessment and Plan: # Acute metabolic encephalopathy most likely secondary to COVID viral infection Acute metabolic encephalopathy has been improved, patient is still pleasantly confused. Continue supportive care and Continue fall precautions # Sepsis secondary to COVID viral infection Continue Paxlovid for total 5 days and statin in the mean time, then resume after Paxlovid. S/p IV fluid as per sepsis protocol. Lactic acid within normal range. Vital signs stable.   Continue midodrine 10 mg p.o. 3 times daily, follow parameters to hold if SBP >110 # History of ESBL E. coli UTI, patient was treated with antibiotics recently. Due to sepsis patient was started on meropenem in the ED which has been continued UA and urine culture ordered but sample could not be collected, patient was not cooperative. S/p meropenem 1 g IV 3 times daily given during hospital stay, transition to Bactrim twice daily for 3 days on discharge. # Hypokalemia, potassium repleted.  Repeat BMP within 1 week # Schizophrenia, history of  auditory hallucinations, paranoid and depression Resumed Prozac 30 mg p.o. daily, Zyprexa 10 mg p.o. twice daily, Ingrezza 40 mg p.o. daily, trazodone 50 mg nightly, Cogentin 0.5 mg p.o. twice daily, home meds # History of seizures, continue gabapentin 100 mg p.o. 3 times daily S/p Keppra IV given during hospital  stay as patient was refusing medication.  Continued Keppra 500 twice daily home dose  on discharge . # History of chronic hypotension: Continue midodrine and Florinef Monitor BP and titrate medications accordingly # COPD, stable, continue inhalers.  And to continue Singulair. # BPH, continue Proscar, started Flomax  Body mass index is 18.21 kg/m.  Nutrition Interventions:  Patient was seen by physical therapy, who recommended Therapy, SNF placement, which was arranged. On the day of the discharge the patient's vitals were stable, and no other acute medical condition were reported by patient. the patient was felt safe to be discharge at [facility].  Consultants: None Procedures: None  Discharge Exam: General: Appear in no distress, no Rash; Oral Mucosa Clear, moist. Cardiovascular: S1 and S2 Present, no Murmur, Respiratory: normal respiratory effort, Bilateral Air entry present and no Crackles, no wheezes Abdomen: Bowel Sound present, Soft and no tenderness, no hernia Extremities: no Pedal edema, no calf tenderness Neurology: alert and oriented to time, place, and person affect appropriate.  Filed Weights   08/10/23 1900 08/11/23 1351 08/12/23 0623  Weight: 64.5 kg 64.5 kg 60.9 kg   Vitals:   08/12/23 2037 08/13/23 0812  BP: 103/78 119/69  Pulse: (!) 103 70  Resp: 20 18  Temp: (!) 97.3 F (36.3 C) 97.9 F (36.6 C)  SpO2: 95% 100%    DISCHARGE MEDICATION: Allergies as of 08/13/2023       Reactions   Propoxyphene Rash        Medication List     TAKE these medications    aspirin 81 MG chewable tablet Chew 81 mg by mouth daily.   benztropine 0.5 MG tablet Commonly known as: COGENTIN Take 1 tablet (0.5 mg total) by mouth 2 (two) times daily.   Bisacodyl Laxative 10 MG suppository Generic drug: bisacodyl Place 10 mg rectally See admin instructions. Insert 1 suppository rectally every other night at bedtime   finasteride 5 MG tablet Commonly known as: PROSCAR Take 5  mg by mouth daily.   fludrocortisone 0.1 MG tablet Commonly known as: FLORINEF Take 2 tablets (0.2 mg total) by mouth daily.   FLUoxetine 10 MG capsule Commonly known as: PROZAC Take 10 mg by mouth daily. (Take with 20mg  capsule to equal 30mg  total) What changed: Another medication with the same name was changed. Make sure you understand how and when to take each.   FLUoxetine 20 MG capsule Commonly known as: PROZAC Take 1 capsule (20 mg total) by mouth daily. What changed: additional instructions   gabapentin 100 MG capsule Commonly known as: NEURONTIN Take 100 mg by mouth 3 (three) times daily.   hydrOXYzine 50 MG tablet Commonly known as: ATARAX Take 50 mg by mouth every 8 (eight) hours as needed (agitation).   levETIRAcetam 500 MG tablet Commonly known as: Keppra Take 1 tablet (500 mg total) by mouth 2 (two) times daily.   LORazepam 1 MG tablet Commonly known as: ATIVAN Take 1 mg by mouth every 8 (eight) hours as needed for anxiety or sedation.   lovastatin 20 MG tablet Commonly known as: MEVACOR Take 1 tablet (20 mg total) by mouth daily. What changed: when to take this   megestrol 400 MG/10ML  suspension Commonly known as: MEGACE Take 10 mLs (400 mg total) by mouth daily.   melatonin 5 MG Tabs Take 5 mg by mouth at bedtime.   midodrine 10 MG tablet Commonly known as: PROAMATINE Take 1 tablet (10 mg total) by mouth 3 (three) times daily with meals. What changed: additional instructions   montelukast 10 MG tablet Commonly known as: SINGULAIR Take 1 tablet (10 mg total) by mouth at bedtime.   multivitamin with minerals Tabs tablet Take 1 tablet by mouth daily.   nirmatrelvir/ritonavir 20 x 150 MG & 10 x 100MG  Tabs Commonly known as: PAXLOVID Take 3 tablets by mouth 2 (two) times daily for 2 days. Patient GFR is >60. Take nirmatrelvir (150 mg) two tablets twice daily for 5 days and ritonavir (100 mg) one tablet twice daily for 5 days.   OLANZapine 10 MG  tablet Commonly known as: ZYPREXA Take 10 mg by mouth 2 (two) times daily.   senna 8.6 MG Tabs tablet Commonly known as: SENOKOT Take 1 tablet by mouth daily.   sulfamethoxazole-trimethoprim 800-160 MG tablet Commonly known as: BACTRIM DS Take 1 tablet by mouth every 12 (twelve) hours for 3 days.   tamsulosin 0.4 MG Caps capsule Commonly known as: FLOMAX Take 1 capsule (0.4 mg total) by mouth daily after supper.   traZODone 50 MG tablet Commonly known as: DESYREL Take 1 tablet (50 mg total) by mouth at bedtime.   Trelegy Ellipta 100-62.5-25 MCG/ACT Aepb Generic drug: Fluticasone-Umeclidin-Vilant Inhale 1 puff into the lungs daily.   valbenazine 40 MG capsule Commonly known as: INGREZZA Take 40 mg by mouth daily.       Allergies  Allergen Reactions   Propoxyphene Rash   Discharge Instructions     Call MD for:  difficulty breathing, headache or visual disturbances   Complete by: As directed    Call MD for:  extreme fatigue   Complete by: As directed    Call MD for:  persistant dizziness or light-headedness   Complete by: As directed    Call MD for:  persistant nausea and vomiting   Complete by: As directed    Call MD for:  severe uncontrolled pain   Complete by: As directed    Call MD for:  temperature >100.4   Complete by: As directed    Diet - low sodium heart healthy   Complete by: As directed    Discharge instructions   Complete by: As directed    Follow-up with PCP, patient should be seen by an MD in 1 to 2 days, monitor vitals and titrate medication accordingly.   Increase activity slowly   Complete by: As directed        The results of significant diagnostics from this hospitalization (including imaging, microbiology, ancillary and laboratory) are listed below for reference.    Significant Diagnostic Studies: CT Head Wo Contrast  Result Date: 08/10/2023 CLINICAL DATA:  Altered mental status, recent fall, hypotensive EXAM: CT HEAD WITHOUT CONTRAST  CT CERVICAL SPINE WITHOUT CONTRAST TECHNIQUE: Multidetector CT imaging of the head and cervical spine was performed following the standard protocol without intravenous contrast. Multiplanar CT image reconstructions of the cervical spine were also generated. RADIATION DOSE REDUCTION: This exam was performed according to the departmental dose-optimization program which includes automated exposure control, adjustment of the mA and/or kV according to patient size and/or use of iterative reconstruction technique. COMPARISON:  04/27/2023 CT head and cervical spine FINDINGS: CT HEAD FINDINGS Brain: No evidence of acute infarct, hemorrhage, mass,  mass effect, or midline shift. No hydrocephalus or extra-axial fluid collection. Age related cerebral atrophy. Vascular: No hyperdense vessel. Vascular coils in the proximal left MCA. Skull: Negative for fracture or focal lesion. Sinuses/Orbits: Mucosal thickening in the right maxillary sinus and ethmoid air cells. No acute finding in the orbits. Other: The mastoid air cells are well aerated. CT CERVICAL SPINE FINDINGS Alignment: No traumatic listhesis. Straightening and reversal of the normal cervical lordosis. Skull base and vertebrae: No acute fracture or suspicious osseous lesion. Soft tissues and spinal canal: No prevertebral fluid or swelling. No visible canal hematoma. Disc levels: Degenerative changes in the cervical spine. No high-grade spinal canal stenosis. Left-greater-than-right facet arthropathy. Upper chest: Negative. IMPRESSION: 1. No acute intracranial process. 2. No acute fracture or traumatic listhesis in the cervical spine. Electronically Signed   By: Wiliam Ke M.D.   On: 08/10/2023 18:32   CT Cervical Spine Wo Contrast  Result Date: 08/10/2023 CLINICAL DATA:  Altered mental status, recent fall, hypotensive EXAM: CT HEAD WITHOUT CONTRAST CT CERVICAL SPINE WITHOUT CONTRAST TECHNIQUE: Multidetector CT imaging of the head and cervical spine was performed  following the standard protocol without intravenous contrast. Multiplanar CT image reconstructions of the cervical spine were also generated. RADIATION DOSE REDUCTION: This exam was performed according to the departmental dose-optimization program which includes automated exposure control, adjustment of the mA and/or kV according to patient size and/or use of iterative reconstruction technique. COMPARISON:  04/27/2023 CT head and cervical spine FINDINGS: CT HEAD FINDINGS Brain: No evidence of acute infarct, hemorrhage, mass, mass effect, or midline shift. No hydrocephalus or extra-axial fluid collection. Age related cerebral atrophy. Vascular: No hyperdense vessel. Vascular coils in the proximal left MCA. Skull: Negative for fracture or focal lesion. Sinuses/Orbits: Mucosal thickening in the right maxillary sinus and ethmoid air cells. No acute finding in the orbits. Other: The mastoid air cells are well aerated. CT CERVICAL SPINE FINDINGS Alignment: No traumatic listhesis. Straightening and reversal of the normal cervical lordosis. Skull base and vertebrae: No acute fracture or suspicious osseous lesion. Soft tissues and spinal canal: No prevertebral fluid or swelling. No visible canal hematoma. Disc levels: Degenerative changes in the cervical spine. No high-grade spinal canal stenosis. Left-greater-than-right facet arthropathy. Upper chest: Negative. IMPRESSION: 1. No acute intracranial process. 2. No acute fracture or traumatic listhesis in the cervical spine. Electronically Signed   By: Wiliam Ke M.D.   On: 08/10/2023 18:32   DG Chest Port 1 View  Result Date: 08/10/2023 CLINICAL DATA:  Hypotension, altered level of consciousness EXAM: PORTABLE CHEST 1 VIEW COMPARISON:  06/26/2023 FINDINGS: 2 frontal views of the chest demonstrate a stable cardiac silhouette. Continued ectasia of the thoracic aorta. No acute airspace disease, effusion, or pneumothorax. Continued background parenchymal scarring, basilar  predominant. No effusion or pneumothorax. No acute bony abnormalities. IMPRESSION: 1. Chronic background scarring.  No acute airspace disease. Electronically Signed   By: Sharlet Salina M.D.   On: 08/10/2023 17:12    Microbiology: Recent Results (from the past 240 hour(s))  Resp panel by RT-PCR (RSV, Flu A&B, Covid) Anterior Nasal Swab     Status: Abnormal   Collection Time: 08/10/23  4:39 PM   Specimen: Anterior Nasal Swab  Result Value Ref Range Status   SARS Coronavirus 2 by RT PCR POSITIVE (A) NEGATIVE Final    Comment: (NOTE) SARS-CoV-2 target nucleic acids are DETECTED.  The SARS-CoV-2 RNA is generally detectable in upper respiratory specimens during the acute phase of infection. Positive results are indicative of  the presence of the identified virus, but do not rule out bacterial infection or co-infection with other pathogens not detected by the test. Clinical correlation with patient history and other diagnostic information is necessary to determine patient infection status. The expected result is Negative.  Fact Sheet for Patients: BloggerCourse.com  Fact Sheet for Healthcare Providers: SeriousBroker.it  This test is not yet approved or cleared by the [place] FDA and  has been authorized for detection and/or diagnosis of SARS-CoV-2 by FDA under an Emergency Use Authorization (EUA).  This EUA will remain in effect (meaning this test can be used) for the duration of  the COVID-19 declaration under Section 564(b)(1) of the A ct, 21 U.S.C. section 360bbb-3(b)(1), unless the authorization is terminated or revoked sooner.     Influenza A by PCR NEGATIVE NEGATIVE Final   Influenza B by PCR NEGATIVE NEGATIVE Final    Comment: (NOTE) The Xpert Xpress SARS-CoV-2/FLU/RSV plus assay is intended as an aid in the diagnosis of influenza from Nasopharyngeal swab specimens and should not be used as a sole basis for treatment.  Nasal washings and aspirates are unacceptable for Xpert Xpress SARS-CoV-2/FLU/RSV testing.  Fact Sheet for Patients: BloggerCourse.com  Fact Sheet for Healthcare Providers: SeriousBroker.it  This test is not yet approved or cleared by the [place] FDA and has been authorized for detection and/or diagnosis of SARS-CoV-2 by FDA under an Emergency Use Authorization (EUA). This EUA will remain in effect (meaning this test can be used) for the duration of the COVID-19 declaration under Section 564(b)(1) of the Act, 21 U.S.C. section 360bbb-3(b)(1), unless the authorization is terminated or revoked.     Resp Syncytial Virus by PCR NEGATIVE NEGATIVE Final    Comment: (NOTE) Fact Sheet for Patients: BloggerCourse.com  Fact Sheet for Healthcare Providers: SeriousBroker.it  This test is not yet approved or cleared by the [place] FDA and has been authorized for detection and/or diagnosis of SARS-CoV-2 by FDA under an Emergency Use Authorization (EUA). This EUA will remain in effect (meaning this test can be used) for the duration of the COVID-19 declaration under Section 564(b)(1) of the Act, 21 U.S.C. section 360bbb-3(b)(1), unless the authorization is terminated or revoked.  Performed at [facility], [address] [place], [place] [address]   Blood Culture (routine x 2)     Status: None (Preliminary result)   Collection Time: 08/10/23  4:39 PM   Specimen: BLOOD  Result Value Ref Range Status   Specimen Description BLOOD BLOOD RIGHT HAND  Final   Special Requests   Final    BOTTLES DRAWN AEROBIC AND ANAEROBIC Blood Culture results may not be optimal due to an inadequate volume of blood received in culture bottles   Culture   Final    NO GROWTH 3 DAYS Performed at [facility], [address], [place], [place] [address]    Report Status PENDING   Incomplete  Culture, blood (Routine X 2) w Reflex to ID Panel     Status: None (Preliminary result)   Collection Time: 08/11/23 10:30 AM   Specimen: BLOOD LEFT HAND  Result Value Ref Range Status   Specimen Description BLOOD LEFT HAND  Final   Special Requests   Final    BOTTLES DRAWN AEROBIC AND ANAEROBIC Blood Culture adequate volume   Culture   Final    NO GROWTH 2 DAYS Performed at [facility], [address], [place], [place] [address]    Report Status PENDING  Incomplete  Labs: CBC: Recent Labs  Lab 08/10/23 1507 08/11/23 1029 08/12/23 0548 08/13/23 0846  WBC 10.6* 7.9 7.4 7.1  HGB 11.5* 10.9* 10.0* 9.8*  HCT 35.5* 33.4* 30.6* 29.6*  MCV 96.2 96.3 96.5 94.9  PLT 222 189 171 167   Basic Metabolic Panel: Recent Labs  Lab 08/10/23 1507 08/11/23 1029 08/12/23 0548 08/13/23 0846  NA 141 139 144 139  K 3.4* 3.2* 3.9 3.2*  CL 108 106 112* 109  CO2 22 24 24 24   GLUCOSE 99 84 98 101*  BUN 15 11 16 17   CREATININE 0.73 0.62 0.77 0.68  CALCIUM 8.9 8.5* 8.5* 8.1*  MG  --   --  2.4 2.1  PHOS  --   --  3.7 3.4   Liver Function Tests: Recent Labs  Lab 08/10/23 1507 08/11/23 1029  AST 28 28  ALT 12 13  ALKPHOS 55 47  BILITOT 0.8 1.1  PROT 6.9 5.9*  ALBUMIN 3.7 3.2*   No results for input(s): "LIPASE", "AMYLASE" in the last 168 hours. No results for input(s): "AMMONIA" in the last 168 hours. Cardiac Enzymes: No results for input(s): "CKTOTAL", "CKMB", "CKMBINDEX", "TROPONINI" in the last 168 hours. BNP (last 3 results) No results for input(s): "BNP" in the last 8760 hours. CBG: No results for input(s): "GLUCAP" in the last 168 hours.  Time spent: 35 minutes  Signed:  Gillis Santa  [facility] 08/13/2023 2:38 PM

## 2023-08-13 NOTE — TOC Progression Note (Signed)
Transition of Care [facility]) - Progression Note    Patient Details  Name: Ian Medina MRN: [id number] Date of Birth: 11-16-1956  Transition of [facility]) CM/SW Contact  Marlowe Sax, RN Phone Number: 08/13/2023, 12:24 PM  Clinical Narrative:      Grier Rocher at [facility] and left a Secure VM asking her if he can return  Awaiting a call back      Expected Discharge Plan and Services                                               Social Determinants of Health (SDOH) Interventions SDOH Screenings   Food Insecurity: Food Insecurity Present (08/11/2023)  Housing: Patient Declined (08/11/2023)  Transportation Needs: Unmet Transportation Needs (08/11/2023)  Utilities: Not At Risk (08/11/2023)  Tobacco Use: Medium Risk (08/10/2023)    Readmission Risk Interventions    06/27/2023   11:44 AM  Readmission Risk Prevention Plan  Transportation Screening Complete  Medication Review (RN Care Manager) Complete  PCP or Specialist appointment within 3-5 days of discharge Complete  SW Recovery Care/Counseling Consult Complete  Palliative Care Screening Not Applicable  Skilled Nursing Facility Complete

## 2023-08-13 NOTE — Plan of Care (Signed)

## 2023-08-13 NOTE — Care Management Important Message (Signed)
Important Message  Patient Details  Name: Ian Medina MRN: [id number] Date of Birth: 01-18-1956   Medicare Important Message Given:  N/A - LOS <3 / Initial given by admissions     [name] A [name] 08/13/2023, 9:57 AM

## 2023-08-13 NOTE — Progress Notes (Signed)
This nurse was able to contact Legal Guardian, Ms [name] at this time.

## 2023-08-13 NOTE — TOC Progression Note (Signed)
Transition of Care [facility]) - Progression Note    Patient Details  Name: Ian Medina MRN: [id number] Date of Birth: 1955-11-29  Transition of Care [facility]) CM/SW Contact  Marlowe Sax, RN Phone Number: 08/13/2023, 2:20 PM  Clinical Narrative:    Sherron Monday with Archie Patten at [facility], the patient is long term resident there and may return today Fl2 completed   Expected Discharge Plan: Skilled Nursing Facility Barriers to Discharge: No Barriers Identified  Expected Discharge Plan and Services   Discharge Planning Services: CM Consult   Living arrangements for the past 2 months: Skilled Nursing Facility                   DME Agency: NA       HH Arranged: NA           Social Determinants of Health (SDOH) Interventions SDOH Screenings   Food Insecurity: Food Insecurity Present (08/11/2023)  Housing: Patient Declined (08/11/2023)  Transportation Needs: Unmet Transportation Needs (08/11/2023)  Utilities: Not At Risk (08/11/2023)  Tobacco Use: Medium Risk (08/10/2023)    Readmission Risk Interventions    06/27/2023   11:44 AM  Readmission Risk Prevention Plan  Transportation Screening Complete  Medication Review (RN Care Manager) Complete  PCP or Specialist appointment within 3-5 days of discharge Complete  SW Recovery Care/Counseling Consult Complete  Palliative Care Screening Not Applicable  Skilled Nursing Facility Complete

## 2023-08-13 NOTE — TOC Progression Note (Addendum)
Transition of Care [facility]) - Progression Note    Patient Details  Name: Ian Medina MRN: [id number] Date of Birth: 09-22-1956  Transition of Care [facility]) CM/SW Contact  Marlowe Sax, RN Phone Number: 08/13/2023, 3:10 PM  Clinical Narrative:     Called and left a VM for [name] the legal guardian to inform that the patient will return to [facility]  room [facility] to arrange transport He is next on the list  Expected Discharge Plan: Skilled Nursing Facility Barriers to Discharge: No Barriers Identified  Expected Discharge Plan and Services   Discharge Planning Services: CM Consult   Living arrangements for the past 2 months: Skilled Nursing Facility Expected Discharge Date: 08/13/23                 DME Agency: NA       HH Arranged: NA           Social Determinants of Health (SDOH) Interventions SDOH Screenings   Food Insecurity: Food Insecurity Present (08/11/2023)  Housing: Patient Declined (08/11/2023)  Transportation Needs: Unmet Transportation Needs (08/11/2023)  Utilities: Not At Risk (08/11/2023)  Tobacco Use: Medium Risk (08/10/2023)    Readmission Risk Interventions    06/27/2023   11:44 AM  Readmission Risk Prevention Plan  Transportation Screening Complete  Medication Review (RN Care Manager) Complete  PCP or Specialist appointment within 3-5 days of discharge Complete  SW Recovery Care/Counseling Consult Complete  Palliative Care Screening Not Applicable  Skilled Nursing Facility Complete

## 2023-08-13 NOTE — Progress Notes (Signed)
Patient discharged per MD orders at this time.All discharge instructions, education and medications reviewed with the patient.Pt expressed understanding and will comply with dc instructions.follow up appointments was also communicated to the patient.no verbal c/o or any ssx of distress.Pt was discharged to the [facility] for LTC services per order.report was called to staff nurse Crystal before transport. Patient was transported by two [facility] personnel on a stretcher.

## 2023-08-15 LAB — CULTURE, BLOOD (ROUTINE X 2): Culture: NO GROWTH

## 2023-08-16 LAB — CULTURE, BLOOD (ROUTINE X 2)
Culture: NO GROWTH
Special Requests: ADEQUATE

## 2023-11-07 IMAGING — DX DG CHEST 2V
2 series · 2 of 2 positions shown · non-contrast
Comparison: October 24, 2010

CLINICAL DATA: Shortness of breath and cough.

EXAM:
CHEST - 2 VIEW

[chest pa]
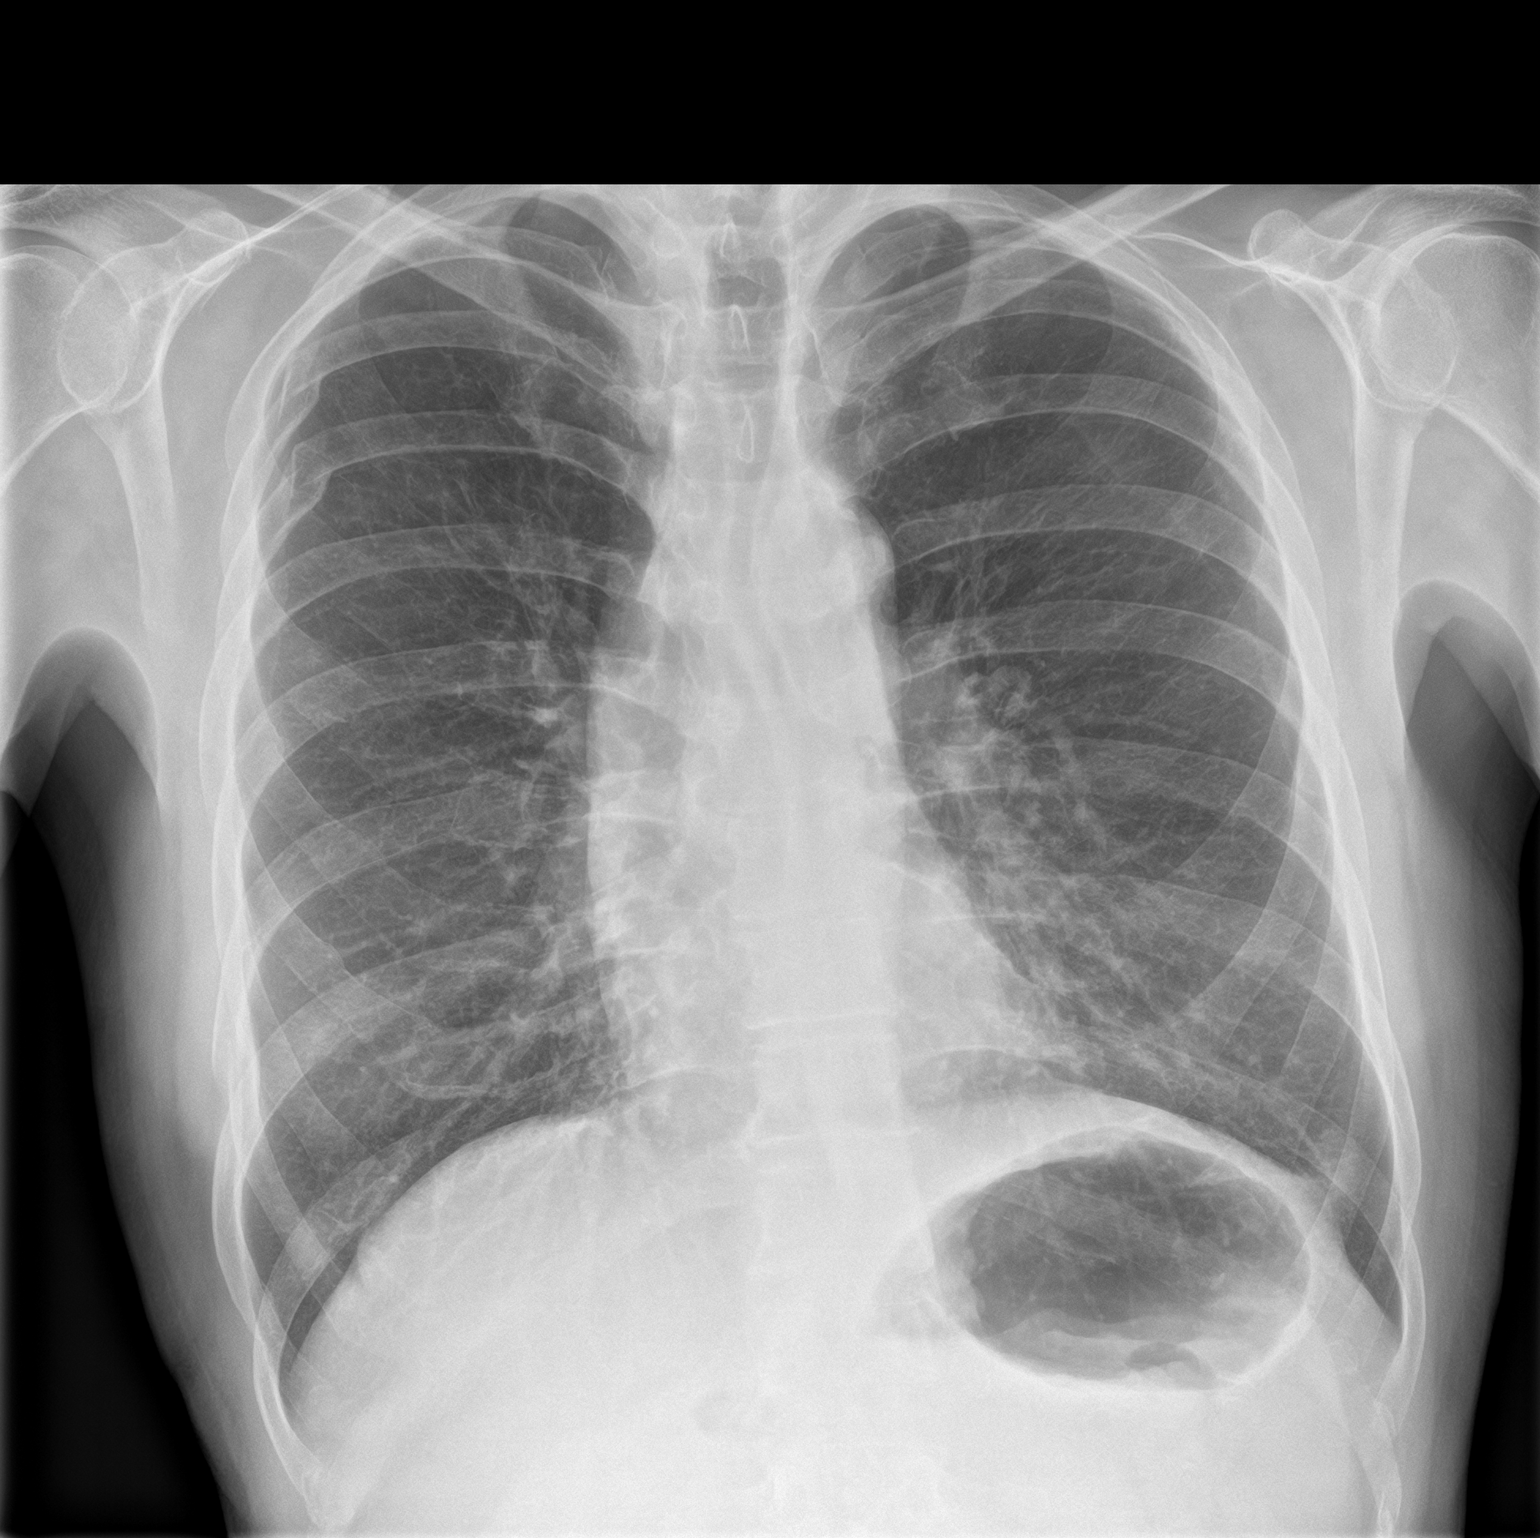

[chest lat]
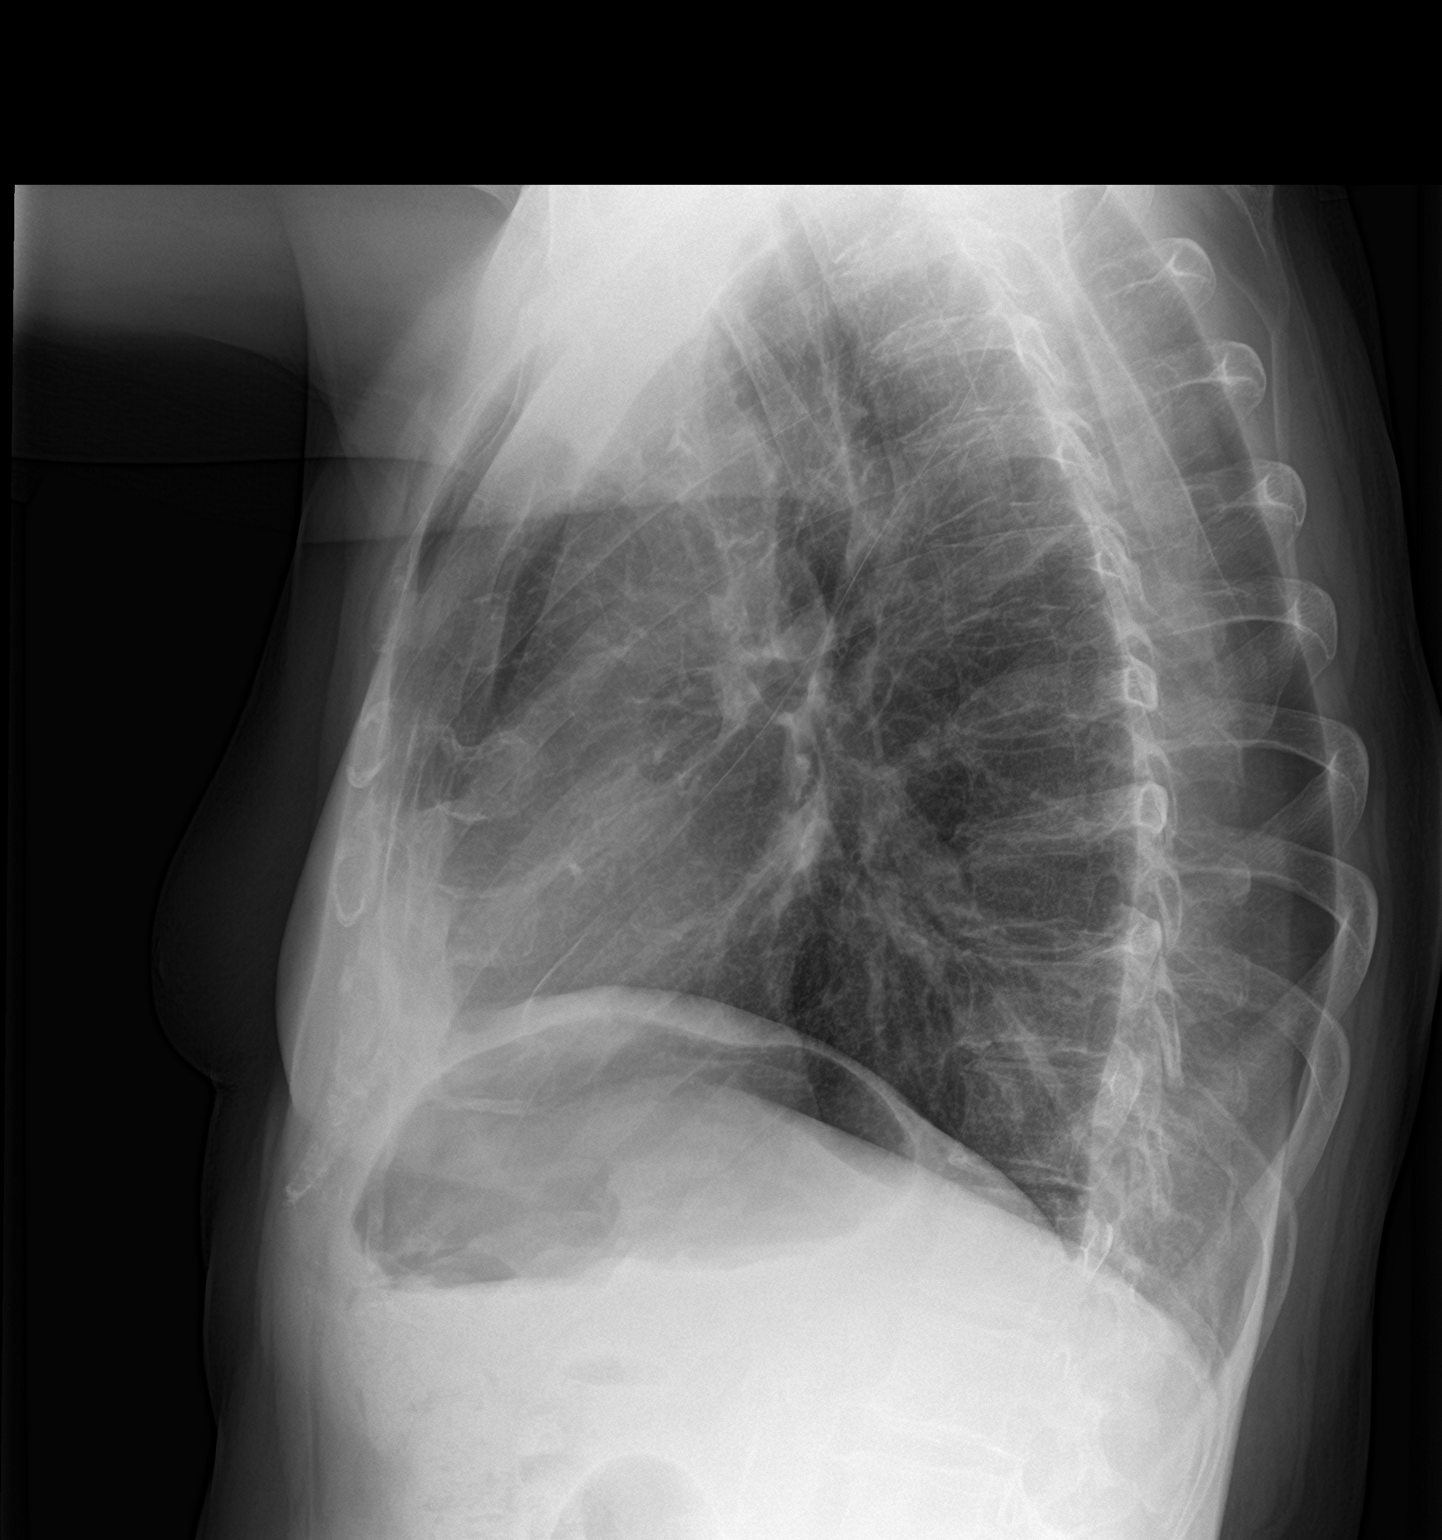

[2 of 2 positions shown; findings below may reference images not displayed]

FINDINGS: Mild left basilar infiltrate is seen. There is no evidence of a
pleural effusion or pneumothorax. The heart size and mediastinal
contours are within normal limits. Multiple chronic right-sided rib
fractures are present.
IMPRESSION: Mild left basilar infiltrate.

## 2023-11-08 ENCOUNTER — Emergency Department: Payer: 59

## 2023-11-08 ENCOUNTER — Encounter: Payer: Self-pay | Admitting: Emergency Medicine

## 2023-11-08 ENCOUNTER — Other Ambulatory Visit: Payer: Self-pay

## 2023-11-08 ENCOUNTER — Emergency Department
Admission: EM | Admit: 2023-11-08 | Discharge: 2023-11-08 | Disposition: A | Discharge status: Home / Self Care | Payer: 59 | Attending: Emergency Medicine | Admitting: Emergency Medicine

## 2023-11-08 DIAGNOSIS — N390 Urinary tract infection, site not specified: Secondary | ICD-10-CM | POA: Diagnosis not present

## 2023-11-08 DIAGNOSIS — S01112A Laceration without foreign body of left eyelid and periocular area, initial encounter: Secondary | ICD-10-CM | POA: Insufficient documentation

## 2023-11-08 DIAGNOSIS — I10 Essential (primary) hypertension: Secondary | ICD-10-CM | POA: Diagnosis not present

## 2023-11-08 DIAGNOSIS — W19XXXA Unspecified fall, initial encounter: Secondary | ICD-10-CM

## 2023-11-08 DIAGNOSIS — S0990XA Unspecified injury of head, initial encounter: Secondary | ICD-10-CM | POA: Diagnosis not present

## 2023-11-08 DIAGNOSIS — W06XXXA Fall from bed, initial encounter: Secondary | ICD-10-CM | POA: Diagnosis not present

## 2023-11-08 DIAGNOSIS — J449 Chronic obstructive pulmonary disease, unspecified: Secondary | ICD-10-CM | POA: Insufficient documentation

## 2023-11-08 DIAGNOSIS — R519 Headache, unspecified: Secondary | ICD-10-CM | POA: Diagnosis present

## 2023-11-08 LAB — URINALYSIS, COMPLETE (UACMP) WITH MICROSCOPIC
Bilirubin Urine: NEGATIVE
Glucose, UA: NEGATIVE mg/dL
Hgb urine dipstick: NEGATIVE
Ketones, ur: NEGATIVE mg/dL
Nitrite: NEGATIVE
Protein, ur: NEGATIVE mg/dL
Specific Gravity, Urine: 1.009 (ref 1.005–1.030)
Squamous Epithelial / HPF: 0 /[HPF] (ref 0–5)
WBC, UA: >50 WBC/hpf (ref 0–5)
pH: 6 (ref 5.0–8.0)

## 2023-11-08 LAB — CBC
HCT: 33.9 % — ABNORMAL LOW (ref 39.0–52.0)
Hemoglobin: 11.1 g/dL — ABNORMAL LOW (ref 13.0–17.0)
MCH: 31.4 pg (ref 26.0–34.0)
MCHC: 32.7 g/dL (ref 30.0–36.0)
MCV: 95.8 fL (ref 80.0–100.0)
Platelets: 174 10*3/uL (ref 150–400)
RBC: 3.54 MIL/uL — ABNORMAL LOW (ref 4.22–5.81)
RDW: 13.2 % (ref 11.5–15.5)
WBC: 9.3 10*3/uL (ref 4.0–10.5)
nRBC: 0 % (ref 0.0–0.2)

## 2023-11-08 LAB — COMPREHENSIVE METABOLIC PANEL
ALT: 13 U/L (ref 0–44)
AST: 16 U/L (ref 15–41)
Albumin: 3.8 g/dL (ref 3.5–5.0)
Alkaline Phosphatase: 51 U/L (ref 38–126)
Anion gap: 10 (ref 5–15)
BUN: 14 mg/dL (ref 8–23)
CO2: 23 mmol/L (ref 22–32)
Calcium: 8.7 mg/dL — ABNORMAL LOW (ref 8.9–10.3)
Chloride: 102 mmol/L (ref 98–111)
Creatinine, Ser: 0.82 mg/dL (ref 0.61–1.24)
GFR, Estimated: >60 mL/min (ref >60)
Glucose, Bld: 111 mg/dL — ABNORMAL HIGH (ref 70–99)
Potassium: 3 mmol/L — ABNORMAL LOW (ref 3.5–5.1)
Sodium: 135 mmol/L (ref 135–145)
Total Bilirubin: 0.8 mg/dL (ref <1.2)
Total Protein: 6.5 g/dL (ref 6.5–8.1)

## 2023-11-08 LAB — TROPONIN I (HIGH SENSITIVITY): Troponin I (High Sensitivity): 4 ng/L (ref <18)

## 2023-11-08 LAB — CK: Total CK: 44 U/L — ABNORMAL LOW (ref 49–397)

## 2023-11-08 MED ORDER — SODIUM CHLORIDE 0.9 % IV SOLN
1.0000 g | Freq: Once | INTRAVENOUS | Status: AC
  Administered 2023-11-08: 1 g via INTRAVENOUS
  Filled 2023-11-08: qty 10

## 2023-11-08 MED ORDER — SODIUM CHLORIDE 0.9 % IV BOLUS
1000.0000 mL | Freq: Once | INTRAVENOUS | Status: AC
  Administered 2023-11-08: 1000 mL via INTRAVENOUS

## 2023-11-08 MED ORDER — LIDOCAINE HCL (PF) 1 % IJ SOLN
5.0000 mL | Freq: Once | INTRAMUSCULAR | Status: AC
  Administered 2023-11-08: 5 mL
  Filled 2023-11-08: qty 5

## 2023-11-08 MED ORDER — CEPHALEXIN 500 MG PO CAPS: 500.0000 mg | ORAL_CAPSULE | Freq: Three times a day (TID) | ORAL | 0 refills | Status: DC

## 2023-11-08 NOTE — ED Notes (Signed)
Discharged with EMS and d/c paperwork. Report called to facility by prior RN.

## 2023-11-08 NOTE — ED Provider Notes (Signed)
[facility] Provider Note    Event Date/Time   First MD Initiated Contact with Patient 11/08/23 1919     (approximate)  History   Chief Complaint: Fall  HPI  Ian Medina is a 67 y.o. male with a past medical history of anxiety, prior cerebral aneurysm, mild electrical disability, schizophrenia, COPD, gastric reflux, hypertension, hyperlipidemia, presents to the emergency department after a fall out of bed.  According to report as well as the patient he states he rolled out of bed and fell onto the ground.  Patient has a small laceration to his face with abrasions to his forehead and left cheek.  Patient denies LOC.  He denies any other complaints besides mild facial pain.  Good range of motion in all extremities.  In triage patient noted to be hypotensive 76/49.  When placed in room blood pressures currently 107/70 prior to intervention.  Physical Exam   Triage Vital Signs: ED Triage Vitals  Encounter Vitals Group     BP 11/08/23 1912 (!) 76/49     Systolic BP Percentile --      Diastolic BP Percentile --      Pulse Rate 11/08/23 1912 84     Resp 11/08/23 1912 20     Temp 11/08/23 1912 (!) 97.5 F (36.4 C)     Temp Source 11/08/23 1912 Oral     SpO2 11/08/23 1912 100 %     Weight 11/08/23 1916 134 lb 4.2 oz (60.9 kg)     Height 11/08/23 1916 6' (1.829 m)     Head Circumference --      Peak Flow --      Pain Score 11/08/23 1912 0     Pain Loc --      Pain Education --      Exclude from Growth Chart --     Most recent vital signs: Vitals:   11/08/23 1920 11/08/23 1923  BP:    Pulse: 67   Resp: 19   Temp:  98.3 F (36.8 C)  SpO2: 100%     General: Awake, no distress.  Abrasion to forehead as well as left cheek.  Small laceration approximately 1 cm just above the left eye, but mildly gaping. CV:  Good peripheral perfusion.  Regular rate and rhythm  Resp:  Normal effort.  Equal breath sounds bilaterally.  Abd:  No distention.  Soft,  nontender.  No rebound or guarding. Other:  Good range of motion in all extremities including bilateral hips.  No pain elicited.   ED Results / Procedures / Treatments   EKG  EKG viewed and interpreted by myself shows sinus rhythm at 64 bpm with a narrow QRS, normal axis, normal intervals, nonspecific ST changes.  RADIOLOGY  I have reviewed and interpreted CT head images.  No bleed seen on my evaluation.   MEDICATIONS ORDERED IN ED: Medications  sodium chloride 0.9 % bolus 1,000 mL (1,000 mLs Intravenous New Bag/Given 11/08/23 1928)  lidocaine (PF) (XYLOCAINE) 1 % injection 5 mL (5 mLs Infiltration Given 11/08/23 1928)     IMPRESSION / MDM / ASSESSMENT AND PLAN / ED COURSE  I reviewed the triage vital signs and the nursing notes.  Patient's presentation is most consistent with acute presentation with potential threat to life or bodily function.  Patient presents to the emergency department for a fall out of bed.  Patient has abrasions to his forehead and left cheek.  Does have a small laceration above left eye that  would likely need 1 or 2 stitches to repair.  Will obtain CT imaging of the head and C-spine as precaution.  Patient's labs are reassuring with a normal CBC, overall reassuring chemistry CK nonconcerning at 44.  Patient receiving IV fluids.  We will continue to closely monitor while awaiting results.  Urinalysis pending.  Patient's urinalysis shows greater than 50 white cells with white blood cell clumps.  Will dose IV antibiotics and then discharged with an oral course of antibiotics.  Patient agreeable to plan of care.  Blood pressure remains normotensive currently 122/68 after 1 liter of fluids.  LACERATION REPAIR Performed by: Minna Antis Authorized by: Minna Antis Consent: Verbal consent obtained. Risks and benefits: risks, benefits and alternatives were discussed Consent given by: patient Patient identity confirmed: provided demographic data Prepped  and Draped in normal sterile fashion Wound explored  Laceration Location: Left eyebrow  Laceration Length: 1.5 cm No Foreign Bodies seen or palpated  Anesthesia: local infiltration  Local anesthetic: lidocaine 1% without epinephrine  Anesthetic total: 2 ml  Irrigation method: syringe Amount of cleaning: standard  Skin closure: 5-0 rapid Vicryl  Number of sutures: 2  Technique: Simple interrupted  Patient tolerance: Patient tolerated the procedure well with no immediate complications.   FINAL CLINICAL IMPRESSION(S) / ED DIAGNOSES   Fall Head injury Laceration Urinary tract infection  Note:  This document was prepared using Dragon voice recognition software and may include unintentional dictation errors.   Minna Antis, MD 11/08/23 2202

## 2023-11-08 NOTE — ED Notes (Signed)
Pt returned from ct

## 2023-11-08 NOTE — ED Notes (Addendum)
First Nurse Note: Pt to ED via [facility] from [facility] for an unwitnessed fall about 1 hour PTA. Pt denies LOC. Pt does remember the fall and states that he rolled out of bed. Pt is not on blood thinners. Pt has a small laceration on his forehead. Bleeding is controlled at this time.

## 2023-11-08 NOTE — ED Notes (Signed)
Med necess form printed and given to secretary at this time for transport back to facility.

## 2023-11-08 NOTE — Discharge Instructions (Addendum)
You have suffered a small laceration above the left eye.  This has been repaired with sutures.  They are absorbable and will fall out on their own and do not need to be removed.  Please cover the lacerations and abrasions with Neosporin once or twice daily for the next 7 days.  Please drink plenty of fluids follow-up with your primary care doctor within the next 1 to 2 days for recheck/reevaluation.  Return to the emergency department for any symptom concerning to yourself or staff members.  Please take your entire course of antibiotics as prescribed.

## 2023-11-08 NOTE — ED Notes (Signed)
CALLED [facility] SPOKE WITH MADELYN, REP. PUT PT. ON THE LIST AND HE IS NEXT TO BE PICKED UP.

## 2023-11-08 NOTE — ED Triage Notes (Signed)
Patient wheeled to triage after being brought in via [facility] after falling out of bed. Presents with abrasions to face and left cheek. Patient denies LOC. Denies complaints of pain and states he feels fine. BP notably low after checking both arms. INT placed and moved to room for further care.

## 2023-11-09 LAB — URINE CULTURE: Culture: NO GROWTH

## 2023-11-13 ENCOUNTER — Emergency Department: Payer: 59

## 2023-11-13 ENCOUNTER — Other Ambulatory Visit: Payer: Self-pay

## 2023-11-13 DIAGNOSIS — J449 Chronic obstructive pulmonary disease, unspecified: Secondary | ICD-10-CM | POA: Diagnosis not present

## 2023-11-13 DIAGNOSIS — S82831D Other fracture of upper and lower end of right fibula, subsequent encounter for closed fracture with routine healing: Secondary | ICD-10-CM | POA: Diagnosis present

## 2023-11-13 DIAGNOSIS — W19XXXD Unspecified fall, subsequent encounter: Secondary | ICD-10-CM | POA: Insufficient documentation

## 2023-11-13 DIAGNOSIS — I10 Essential (primary) hypertension: Secondary | ICD-10-CM | POA: Diagnosis not present

## 2023-11-13 NOTE — ED Triage Notes (Signed)
Pt to ED via EMS from [facility], pt had a fall 3 days ago was seen and d/c. Facility did XR of right ankle today and found a fracture. Pt ambulatory on scene for EMS. Pt denies pain right now.

## 2023-11-14 ENCOUNTER — Emergency Department
Admission: EM | Admit: 2023-11-14 | Discharge: 2023-11-14 | Disposition: A | Discharge status: Home / Self Care | Payer: 59 | Attending: Emergency Medicine | Admitting: Emergency Medicine

## 2023-11-14 DIAGNOSIS — S82891A Other fracture of right lower leg, initial encounter for closed fracture: Secondary | ICD-10-CM

## 2023-11-14 NOTE — ED Provider Notes (Signed)
[facility] Provider Note    Event Date/Time   First MD Initiated Contact with Patient 11/14/23 0124     (approximate)   History   No chief complaint on file.   HPI  Ian Medina is a 67 y.o. male   Past medical history of anxiety, prior cerebral aneurysm, intellectual disability, schizophrenia, COPD, acid reflux, hypertension hyperlipidemia who presents to the emergency department from his living facility due to x-ray obtained of the ankle which showed a fracture.  He was evaluated in the emergency department 11/08/2023 for a fall and was discharged.  Apparently facility noted that he seemed to have pain and so got an x-ray which showed right ankle distal fibula fracture.  He has been ambulatory, reportedly.  There were no reported new trauma since his discharge from the emergency department earlier this week.  External Medical Documents Reviewed: Emergency department visit dated 11/08/2023 for a fall and received head imaging, C-spine imaging, had a small laceration repair to the face      Physical Exam   Triage Vital Signs: ED Triage Vitals  Encounter Vitals Group     BP 11/13/23 2220 (!) 148/78     Systolic BP Percentile --      Diastolic BP Percentile --      Pulse --      Resp --      Temp --      Temp src --      SpO2 --      Weight 11/13/23 2219 134 lb 4.2 oz (60.9 kg)     Height 11/13/23 2219 6' (1.829 m)     Head Circumference --      Peak Flow --      Pain Score 11/13/23 2219 0     Pain Loc --      Pain Education --      Exclude from Growth Chart --     Most recent vital signs: Vitals:   11/13/23 2220 11/14/23 0154  BP: (!) 148/78 (!) 145/82  Pulse:  65  Resp:  18  Temp:  97.6 F (36.4 C)  SpO2:  99%    General: Awake, no distress.  CV:  Good peripheral perfusion.  Resp:  Normal effort.  Abd:  No distention.  Other:  Neurovascular intact to the affected right ankle.  Small abrasions, sutures to left side of his  face   ED Results / Procedures / Treatments   Labs (all labs ordered are listed, but only abnormal results are displayed) Labs Reviewed - No data to display     RADIOLOGY I independently reviewed and interpreted x-ray of the right ankle and see a distal fibula fracture I also reviewed radiologist's formal read.   PROCEDURES:  Critical Care performed: No  Procedures   MEDICATIONS ORDERED IN ED: Medications - No data to display  IMPRESSION / MDM / ASSESSMENT AND PLAN / ED COURSE  I reviewed the triage vital signs and the nursing notes.                                Patient's presentation is most consistent with acute presentation with potential threat to life or bodily function.  Differential diagnosis includes, but is not limited to, ankle fracture, dislocation  MDM: Care facility noted injury to the right ankle from fall sustained earlier this week and was otherwise evaluated in the emergency department and discharge.  Found  to have distal fibula fracture, reportedly patient has been ambulatory.  Placed in a splint, Ortho follow-up, nonweightbearing.        FINAL CLINICAL IMPRESSION(S) / ED DIAGNOSES   Final diagnoses:  Closed fracture of right ankle, initial encounter     Rx / DC Orders   ED Discharge Orders     None        Note:  This document was prepared using Dragon voice recognition software and may include unintentional dictation errors.    Pilar Jarvis, MD 11/14/23 [phone]

## 2023-11-14 NOTE — Discharge Instructions (Signed)
Keep ankle in splint and keep weight off of the ankle until you are able to see either the podiatrist or orthopedist for follow-up appointment.   Thank you for choosing [facility] for your health care today!  Please see your primary doctor this week for a follow up appointment.   If you have any new, worsening, or unexpected symptoms call your doctor right away or come back to the emergency department for reevaluation.  It was my pleasure to care for you today.   Daneil Dan Modesto Charon, MD

## 2023-12-07 IMAGING — DX DG CHEST 1V PORT
1 series · 1 of 1 positions shown · non-contrast
Comparison: 01/27/2022

CLINICAL DATA: Chest pain

EXAM:
PORTABLE CHEST 1 VIEW

[chest ap]
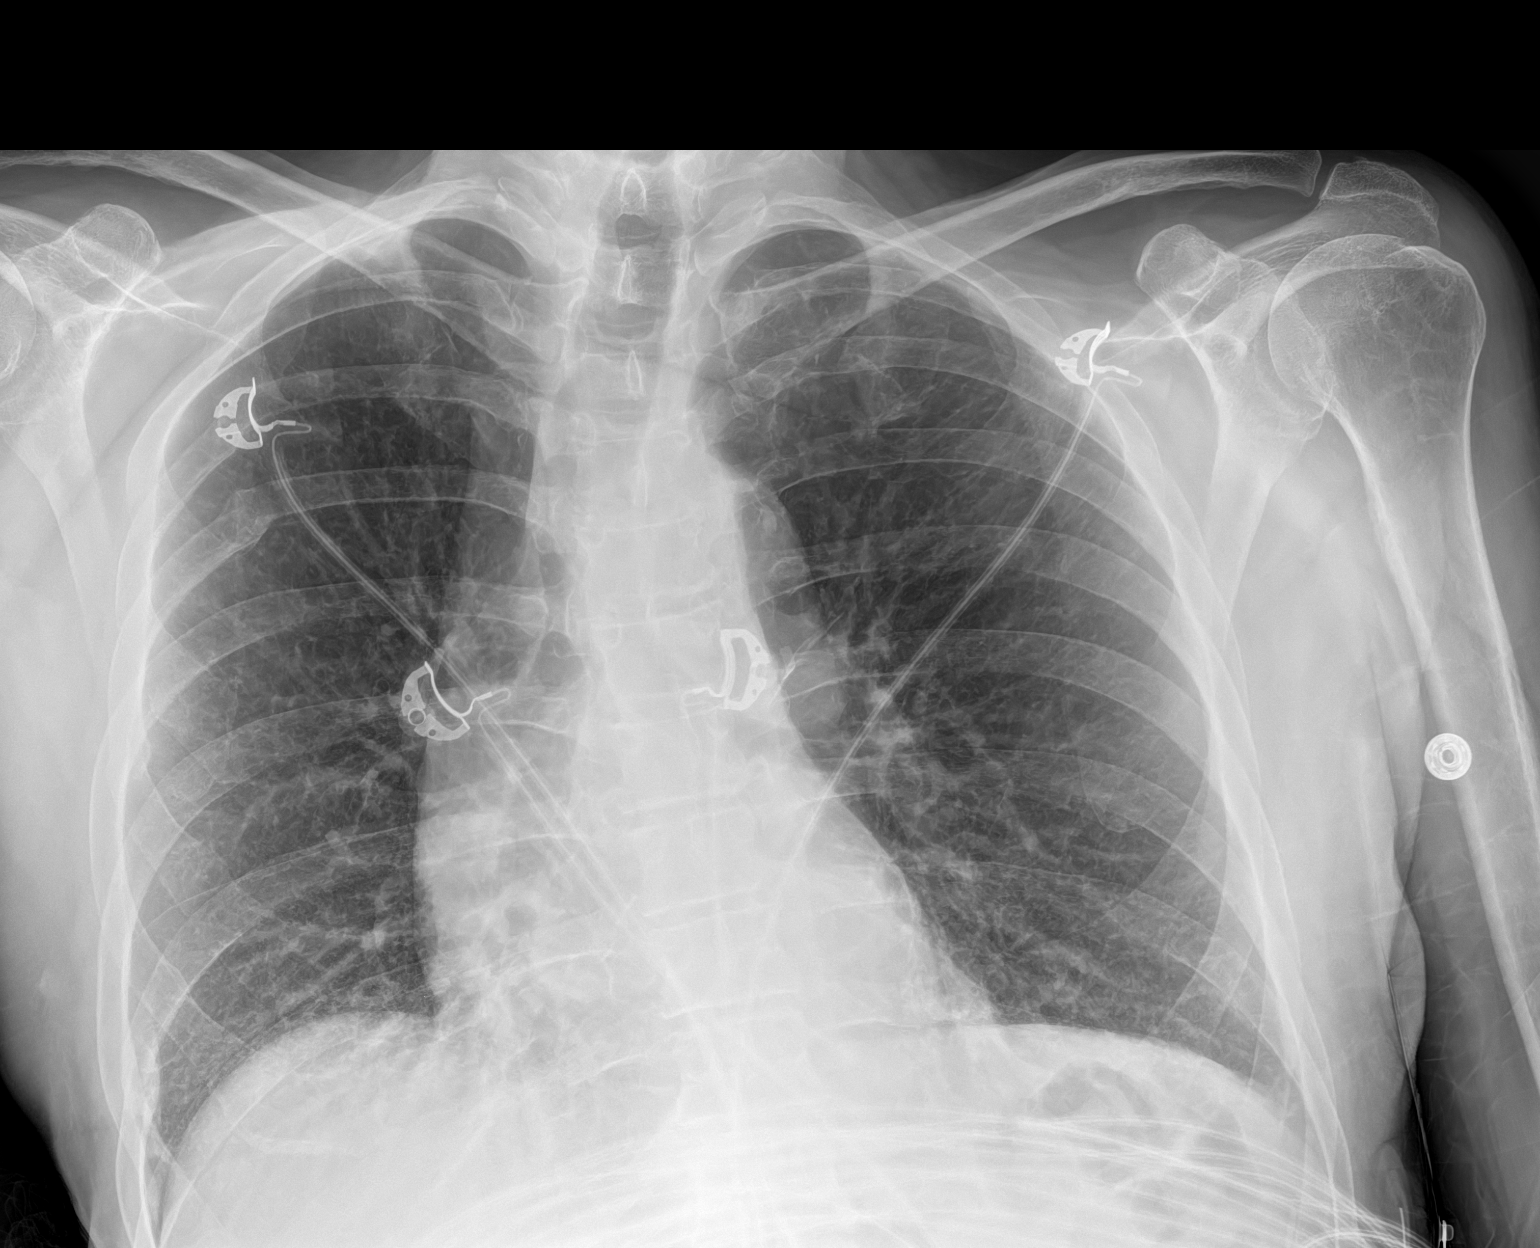

[1 of 1 positions shown; findings below may reference images not displayed]

FINDINGS: The lungs are clear without focal pneumonia, edema, pneumothorax or
pleural effusion. Interstitial markings are diffusely coarsened with
chronic features. The cardiopericardial silhouette is within normal
limits for size. Bones are diffusely demineralized. Telemetry leads
overlie the chest.
IMPRESSION: No active disease.

## 2024-01-15 DIAGNOSIS — S59911A Unspecified injury of right forearm, initial encounter: Secondary | ICD-10-CM | POA: Diagnosis present

## 2024-01-15 DIAGNOSIS — J449 Chronic obstructive pulmonary disease, unspecified: Secondary | ICD-10-CM | POA: Diagnosis not present

## 2024-01-15 DIAGNOSIS — Y92129 Unspecified place in nursing home as the place of occurrence of the external cause: Secondary | ICD-10-CM | POA: Insufficient documentation

## 2024-01-15 DIAGNOSIS — W19XXXA Unspecified fall, initial encounter: Secondary | ICD-10-CM | POA: Insufficient documentation

## 2024-01-15 DIAGNOSIS — I10 Essential (primary) hypertension: Secondary | ICD-10-CM | POA: Insufficient documentation

## 2024-01-15 DIAGNOSIS — S52254A Nondisplaced comminuted fracture of shaft of ulna, right arm, initial encounter for closed fracture: Secondary | ICD-10-CM | POA: Insufficient documentation
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
## 2024-01-16 ENCOUNTER — Emergency Department
Admission: EM | Admit: 2024-01-16 | Discharge: 2024-01-16 | Disposition: A | Discharge status: Home / Self Care | Payer: 59 | Attending: Emergency Medicine | Admitting: Emergency Medicine

## 2024-01-16 ENCOUNTER — Emergency Department: Payer: 59

## 2024-01-16 ENCOUNTER — Encounter: Payer: Self-pay | Admitting: Emergency Medicine

## 2024-01-16 ENCOUNTER — Other Ambulatory Visit: Payer: Self-pay

## 2024-01-16 DIAGNOSIS — S52254A Nondisplaced comminuted fracture of shaft of ulna, right arm, initial encounter for closed fracture: Secondary | ICD-10-CM

## 2024-01-16 MED ORDER — OXYCODONE-ACETAMINOPHEN 5-325 MG PO TABS: 1.0000 | ORAL_TABLET | ORAL | 0 refills | Status: AC | PRN

## 2024-01-16 MED ORDER — OXYCODONE-ACETAMINOPHEN 5-325 MG PO TABS
1.0000 | ORAL_TABLET | Freq: Once | ORAL | Status: AC
  Administered 2024-01-16: 1 via ORAL
  Filled 2024-01-16: qty 1
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
## 2024-04-03 ENCOUNTER — Other Ambulatory Visit: Payer: Self-pay

## 2024-04-03 ENCOUNTER — Emergency Department
Admission: EM | Admit: 2024-04-03 | Discharge: 2024-04-03 | Disposition: A | Discharge status: Home / Self Care | Attending: Emergency Medicine | Admitting: Emergency Medicine

## 2024-04-03 ENCOUNTER — Emergency Department

## 2024-04-03 DIAGNOSIS — W19XXXA Unspecified fall, initial encounter: Secondary | ICD-10-CM

## 2024-04-03 DIAGNOSIS — I10 Essential (primary) hypertension: Secondary | ICD-10-CM | POA: Diagnosis not present

## 2024-04-03 DIAGNOSIS — N390 Urinary tract infection, site not specified: Secondary | ICD-10-CM | POA: Diagnosis not present

## 2024-04-03 DIAGNOSIS — W01198A Fall on same level from slipping, tripping and stumbling with subsequent striking against other object, initial encounter: Secondary | ICD-10-CM | POA: Insufficient documentation

## 2024-04-03 DIAGNOSIS — J449 Chronic obstructive pulmonary disease, unspecified: Secondary | ICD-10-CM | POA: Insufficient documentation

## 2024-04-03 DIAGNOSIS — R55 Syncope and collapse: Secondary | ICD-10-CM | POA: Insufficient documentation

## 2024-04-03 LAB — CBC WITH DIFFERENTIAL/PLATELET
Abs Immature Granulocytes: 0.05 10*3/uL (ref 0.00–0.07)
Basophils Absolute: 0.1 10*3/uL (ref 0.0–0.1)
Basophils Relative: 1 %
Eosinophils Absolute: 0.1 10*3/uL (ref 0.0–0.5)
Eosinophils Relative: 2 %
HCT: 32.9 % — ABNORMAL LOW (ref 39.0–52.0)
Hemoglobin: 10.3 g/dL — ABNORMAL LOW (ref 13.0–17.0)
Immature Granulocytes: 1 %
Lymphocytes Relative: 36 %
Lymphs Abs: 3.4 10*3/uL (ref 0.7–4.0)
MCH: 31.1 pg (ref 26.0–34.0)
MCHC: 31.3 g/dL (ref 30.0–36.0)
MCV: 99.4 fL (ref 80.0–100.0)
Monocytes Absolute: 0.6 10*3/uL (ref 0.1–1.0)
Monocytes Relative: 6 %
Neutro Abs: 5.3 10*3/uL (ref 1.7–7.7)
Neutrophils Relative %: 54 %
Platelets: 201 10*3/uL (ref 150–400)
RBC: 3.31 MIL/uL — ABNORMAL LOW (ref 4.22–5.81)
RDW: 14.3 % (ref 11.5–15.5)
WBC: 9.6 10*3/uL (ref 4.0–10.5)
nRBC: 0 % (ref 0.0–0.2)

## 2024-04-03 LAB — COMPREHENSIVE METABOLIC PANEL WITH GFR
ALT: 9 U/L (ref 0–44)
AST: 16 U/L (ref 15–41)
Albumin: 3.3 g/dL — ABNORMAL LOW (ref 3.5–5.0)
Alkaline Phosphatase: 52 U/L (ref 38–126)
Anion gap: 10 (ref 5–15)
BUN: 11 mg/dL (ref 8–23)
CO2: 23 mmol/L (ref 22–32)
Calcium: 8.7 mg/dL — ABNORMAL LOW (ref 8.9–10.3)
Chloride: 107 mmol/L (ref 98–111)
Creatinine, Ser: 1.04 mg/dL (ref 0.61–1.24)
GFR, Estimated: >60 mL/min (ref >60)
Glucose, Bld: 105 mg/dL — ABNORMAL HIGH (ref 70–99)
Potassium: 3.5 mmol/L (ref 3.5–5.1)
Sodium: 140 mmol/L (ref 135–145)
Total Bilirubin: 0.5 mg/dL (ref 0.0–1.2)
Total Protein: 6.5 g/dL (ref 6.5–8.1)

## 2024-04-03 LAB — URINALYSIS, ROUTINE W REFLEX MICROSCOPIC
Bilirubin Urine: NEGATIVE
Glucose, UA: NEGATIVE mg/dL
Ketones, ur: NEGATIVE mg/dL
Nitrite: NEGATIVE
Protein, ur: NEGATIVE mg/dL
Specific Gravity, Urine: 1.004 — ABNORMAL LOW (ref 1.005–1.030)
Squamous Epithelial / HPF: 0 /HPF (ref 0–5)
WBC, UA: >50 WBC/hpf (ref 0–5)
pH: 7 (ref 5.0–8.0)

## 2024-04-03 LAB — TROPONIN I (HIGH SENSITIVITY): Troponin I (High Sensitivity): 4 ng/L (ref <18)

## 2024-04-03 MED ORDER — SODIUM CHLORIDE 0.9 % IV BOLUS
1000.0000 mL | Freq: Once | INTRAVENOUS | Status: AC
  Administered 2024-04-03: 1000 mL via INTRAVENOUS

## 2024-04-03 MED ORDER — CEPHALEXIN 500 MG PO CAPS
500.0000 mg | ORAL_CAPSULE | Freq: Two times a day (BID) | ORAL | 0 refills | Status: AC
Start: 2024-04-03 — End: 2024-04-10

## 2024-04-03 MED ORDER — SODIUM CHLORIDE 0.9 % IV SOLN
1.0000 g | Freq: Once | INTRAVENOUS | Status: AC
  Administered 2024-04-03: 1 g via INTRAVENOUS
  Filled 2024-04-03: qty 10

## 2024-04-03 NOTE — Discharge Instructions (Signed)
 Ian Medina CT scans of the head and neck which are negative.  The cut on the eyebrow area does not need stitches.  His blood pressure is improved.  His labs show a possible urinary tract infection.  He should take the antibiotic as prescribed for the full week.  Return to the ER for new, worsening, or persistent severe weakness or dizziness, recurrent episodes of passing out or falling, vomiting, fever, chest pain, or any other new or worsening symptoms that are concerning.

## 2024-04-03 NOTE — ED Triage Notes (Signed)
 Pt arrived via EMS from [facility] for a fall. Pt sts that when he stood up he got dizzy and then fell. Pt denies having any LOC however pt did hit his head with a laceration to the left eye brow. Pt has fallen three times in the last four days. EMS sts that pt systolic bp was in the 80's on their arrival. EMS then gave IV fluids

## 2024-04-03 NOTE — ED Provider Notes (Signed)
 [facility] Provider Note    Event Date/Time   First MD Initiated Contact with Patient 04/03/24 [phone]     (approximate)   History   Fall   HPI  Ian Medina is a 68 y.o. male with a history of hypertension, hyperlipidemia, COPD, schizophrenia who presents with a head injury and multiple falls.  The patient states that over the last several days he has fallen 4 times.  The patient states that he will suddenly feel weak and "fall out," losing consciousness.  He denies feeling dizzy.  He states he has been feeling weaker in general.  He denies any nausea or vomiting.  He has no diarrhea.  He denies any chest pain or difficulty breathing.  He has some mild headache.    I reviewed the past medical records.  The patient's most recent outpatient encounter was with orthopedics on 4/29 for follow-up of a right forearm fracture that occurred on 2/19.   Physical Exam   Triage Vital Signs: ED Triage Vitals  Encounter Vitals Group     BP      Systolic BP Percentile      Diastolic BP Percentile      Pulse      Resp      Temp      Temp src      SpO2      Weight      Height      Head Circumference      Peak Flow      Pain Score      Pain Loc      Pain Education      Exclude from Growth Chart     Most recent vital signs: Vitals:   04/03/24 1145 04/03/24 1200  BP: 100/63 (!) 142/59  Pulse: 72 79  Resp:    Temp:    SpO2: 100% 100%     General: Alert, comfortable appearing, no distress.  CV:  Good peripheral perfusion.  Resp:  Normal effort.  Lungs CTAB. Abd:  No distention.  Other:  EOMI.  PERRLA.  No photophobia.  No facial droop.  Normal speech.  Motor intact in all extremities.  2 cm superficial laceration to the left eyebrow, closed.  ED Results / Procedures / Treatments   Labs (all labs ordered are listed, but only abnormal results are displayed) Labs Reviewed  COMPREHENSIVE METABOLIC PANEL WITH GFR - Abnormal; Notable for the following  components:      Result Value   Glucose, Bld 105 (*)    Calcium  8.7 (*)    Albumin 3.3 (*)    All other components within normal limits  CBC WITH DIFFERENTIAL/PLATELET - Abnormal; Notable for the following components:   RBC 3.31 (*)    Hemoglobin 10.3 (*)    HCT 32.9 (*)    All other components within normal limits  URINALYSIS, ROUTINE W REFLEX MICROSCOPIC - Abnormal; Notable for the following components:   Color, Urine YELLOW (*)    APPearance HAZY (*)    Specific Gravity, Urine 1.004 (*)    Hgb urine dipstick SMALL (*)    Leukocytes,Ua LARGE (*)    Bacteria, UA RARE (*)    All other components within normal limits  TROPONIN I (HIGH SENSITIVITY)     EKG  ED ECG REPORT I, Lind Repine, the attending physician, personally viewed and interpreted this ECG.  Date: 04/03/2024 EKG Time: 0841 Rate: 63 Rhythm: normal sinus rhythm QRS Axis: normal Intervals: normal ST/T  Wave abnormalities: Nonspecific ST abnormalities Narrative Interpretation: no evidence of acute ischemia    RADIOLOGY  CT head: I independently viewed and interpreted the images; there is no ICH   CT cervical spine: No acute fracture  IMPRESSION:  Head CT:    1. Streak/beam hardening artifact arising from embolization coils in  the left paraclinoid region. Within this limitation, findings are as  follows.  2. No evidence of an acute intracranial abnormality.  3. Parenchymal atrophy and chronic small vessel disease.  4. Mild bilateral ethmoid sinusitis.    CT cervical spine:    1. No evidence of an acute cervical spine fracture.  2. Mild grade 1 anterolisthesis at C2-C3 and C3-C4, unchanged from  the prior cervical spine CT of 11/08/2023.  3. Nonspecific reversal of the expected cervical lordosis.  4. Levocurvature of the cervical spine.  5. Cervical spondylosis as described.  6. Emphysema (ICD10-J43.9).    PROCEDURES:  Critical Care performed: No  Procedures   MEDICATIONS ORDERED  IN ED: Medications  cefTRIAXone  (ROCEPHIN ) 1 g in sodium chloride  0.9 % 100 mL IVPB (1 g Intravenous New Bag/Given 04/03/24 1210)  sodium chloride  0.9 % bolus 1,000 mL (0 mLs Intravenous Stopped 04/03/24 1133)     IMPRESSION / MDM / ASSESSMENT AND PLAN / ED COURSE  I reviewed the triage vital signs and the nursing notes.  68 year old male with PMH as noted above presents with recurrent falls and possible syncope over the last several days, as well as feeling somewhat weak in general.  Per EMS, he was hypotensive in the field.  On exam the patient is overall well-appearing.  His blood pressure is borderline low.  Other vital signs are normal.  Neurologic exam is nonfocal.  He has a superficial laceration to his left eyebrow which is already healing and closed.  Differential diagnosis includes, but is not limited to, dehydration/hypovolemia, electrolyte abnormality, other metabolic cause, UTI or other infection, cardiac dysrhythmia.  Given the trauma we will obtain a CT head and cervical spine, lab workup, give fluids, and reassess.  Patient's presentation is most consistent with acute presentation with potential threat to life or bodily function.  The patient is on the cardiac monitor to evaluate for evidence of arrhythmia and/or significant heart rate changes.  ----------------------------------------- 12:10 PM on 04/03/2024 -----------------------------------------  CT head and cervical spine are negative.  Lab workup is overall reassuring.  CMP shows no concerning electrolyte abnormalities.  CBC shows no leukocytosis or new anemia.  Troponin is negative.  Urinalysis does show greater than 50 WBCs and leukocyte Estrace, concerning for possible UTI.  However, the patient has no evidence of systemic infection or sepsis.  Blood pressure is improved after fluids and the patient states he is feeling much better, "like $100 bill."  I did consider whether the patient may benefit from admission given  that he has had possible syncopal episodes and a UTI.  However, the patient expresses a strong preference to go home.  Given his stable vital signs, reassuring workup, and overall clinical presentation, I feel he is appropriate for discharge with outpatient follow-up.  I ordered a dose of IV ceftriaxone  here and have prescribed Keflex  for home.  I gave strict return precautions and he expressed understanding.    FINAL CLINICAL IMPRESSION(S) / ED DIAGNOSES   Final diagnoses:  Syncope, unspecified syncope type  Fall, initial encounter  Urinary tract infection without hematuria, site unspecified     Rx / DC Orders   ED Discharge Orders  Ordered    cephALEXin  (KEFLEX ) 500 MG capsule  2 times daily        04/03/24 1209             Note:  This document was prepared using Dragon voice recognition software and may include unintentional dictation errors.    Lind Repine, MD 04/03/24 1212

## 2024-06-09 ENCOUNTER — Other Ambulatory Visit: Payer: Self-pay

## 2024-06-09 ENCOUNTER — Emergency Department
Admission: EM | Admit: 2024-06-09 | Discharge: 2024-06-09 | Disposition: A | Discharge status: Home / Self Care | Attending: Emergency Medicine | Admitting: Emergency Medicine

## 2024-06-09 ENCOUNTER — Encounter: Payer: Self-pay | Admitting: Emergency Medicine

## 2024-06-09 ENCOUNTER — Emergency Department

## 2024-06-09 DIAGNOSIS — W06XXXA Fall from bed, initial encounter: Secondary | ICD-10-CM | POA: Diagnosis not present

## 2024-06-09 DIAGNOSIS — S0990XA Unspecified injury of head, initial encounter: Secondary | ICD-10-CM | POA: Diagnosis present

## 2024-06-09 DIAGNOSIS — W19XXXA Unspecified fall, initial encounter: Secondary | ICD-10-CM

## 2024-06-09 DIAGNOSIS — I10 Essential (primary) hypertension: Secondary | ICD-10-CM | POA: Insufficient documentation

## 2024-06-09 DIAGNOSIS — S139XXA Sprain of joints and ligaments of unspecified parts of neck, initial encounter: Secondary | ICD-10-CM | POA: Diagnosis not present

## 2024-06-09 NOTE — TOC Transition Note (Signed)
 Transition of Care [facility]) - Discharge Note   Patient Details  Name: Ian Medina MRN: [id number] Date of Birth: June 23, 1956  Transition of Care [facility]) CM/SW Contact:  Marinda Cooks, RN Phone Number: 06/09/2024, 4:18 PM   Clinical Narrative:    This CM updated by covering MD pt medically cleared to dc today and has active DC order . This CM spoke with .SABRA... Admission liaison Massie at [employer].DC transportation coordinated  for pt  Admission Liaison Austin at [facility] with w/c fleeta service H2 go .Medical team updated . No additional DC needs requested by medical team or identified by CM at this time .     Final next level of care: Skilled Nursing Facility   Returning to [facility]   Discharge Placement               SNF / Rehab   Name of family member notified: Attempted to reach [facility] Guardian  was sent to VM  [phone]  [address]  [place] [address]  Legal guardian    Social Drivers of Health (SDOH) Interventions SDOH Screenings   Food Insecurity: No Food Insecurity (02/07/2024)   Received from [facility]  Housing: Low Risk  (02/07/2024)   Received from [facility]  Transportation Needs: No Transportation Needs (02/07/2024)   Received from [facility]  Utilities: Not At Risk (02/07/2024)   Received from [facility]  Financial Resource Strain: Low Risk  (02/07/2024)   Received from [facility]  Tobacco Use: Medium Risk (06/09/2024)     Readmission Risk Interventions    06/27/2023   11:44 AM  Readmission Risk Prevention Plan  Transportation Screening Complete  Medication Review (RN Care Manager) Complete  PCP or Specialist appointment within 3-5 days of discharge Complete  SW Recovery Care/Counseling Consult Complete  Palliative Care Screening Not Applicable  Skilled Nursing Facility Complete

## 2024-06-09 NOTE — Discharge Instructions (Addendum)
 The area of concern for bleeding had already stopped bleeding when he arrived to the emergency department and has not continued to bleed.  Check the area daily for infection.  You may apply Band-Aid across it to prevent the patient from picking at the area.

## 2024-06-09 NOTE — ED Provider Notes (Signed)
 [name]    Event Date/Time   First MD Initiated Contact with Patient 06/09/24 1001     (approximate)   History   Fall   HPI  Ian Medina is a 68 y.o. male history of schizophrenia, drug abuse, hep C, hypertension, cerebral aneurysm presents emergency department with complaints of a fall.  Patient arrived via EMS from [facility].  States he slipped and fell out of bed this morning.  Hit his head on the floor and has a laceration.  Having pain to the head and neck.  No other injuries reported      Physical Exam   Triage Vital Signs: ED Triage Vitals [06/09/24 0958]  Encounter Vitals Group     BP      Girls Systolic BP Percentile      Girls Diastolic BP Percentile      Boys Systolic BP Percentile      Boys Diastolic BP Percentile      Pulse      Resp      Temp      Temp src      SpO2      Weight 140 lb (63.5 kg)     Height 6' 2.5 (1.892 m)     Head Circumference      Peak Flow      Pain Score 10     Pain Loc      Pain Education      Exclude from Growth Chart     Most recent vital signs: Vitals:   06/09/24 1005  BP: 112/85  Pulse: 70  Resp: (!) 22  Temp: 98.3 F (36.8 C)  SpO2: 100%     General: Awake, no distress.   CV:  Good peripheral perfusion. Resp:  Normal effort.  Abd:  No distention.   Other:  Skull tender at the front, C-spine tender, no bony tenderness on the extremities, no laceration noted, there is an area around a mole that had bled but has stop bleeding at this time   ED Results / Procedures / Treatments   Labs (all labs ordered are listed, but only abnormal results are displayed) Labs Reviewed - No data to display   EKG     RADIOLOGY CT head and cervical spine    PROCEDURES:   Procedures  Critical Care:  No Chief Complaint  Patient presents with   Fall      MEDICATIONS ORDERED IN ED: Medications - No data to display   IMPRESSION / MDM / ASSESSMENT AND  PLAN / ED COURSE  I reviewed the triage vital signs and the nursing notes.                              Differential diagnosis includes, but is not limited to, subdural, SAH, skull fracture, facial laceration, C-spine fracture, strain  Patient's presentation is most consistent with acute illness / injury with system symptoms.    CT of the head and cervical spine  CT of the head and cervical spine independently reviewed interpreted by me as being negative for any acute abnormality  I did call the legal guardian, Ian Medina to let her know that CTs were normal and that we would be sending him back to [name].  She states they can arrange transportation for him.  Did explain findings to the patient.  He was discharged stable condition.  Bandage was applied to  the area that had been previously bleeding.  Instructions for nursing staff at [name] to check the wound daily for infection      FINAL CLINICAL IMPRESSION(S) / ED DIAGNOSES   Final diagnoses:  Fall, initial encounter  Minor head injury, initial encounter  Neck sprain, initial encounter     Rx / DC Orders   ED Discharge Orders     None        Note:  This document was prepared using Dragon voice recognition software and may include unintentional dictation errors.    Ian Medina ORN, [name] 06/09/24 1209    Ian Charleston, MD 06/09/24 1357

## 2024-06-09 NOTE — ED Notes (Signed)
 Report called back to [place] at [facility]  They want him to be transported back via EMS This nurse informed them that they may deny

## 2024-06-09 NOTE — ED Triage Notes (Signed)
 Presents via EMS from [facility] care s/p fall  States slippe out of bed  Hit his head on the floor  Having pain to head and neck   Laceration over left eyebrow

## 2024-06-09 NOTE — ED Notes (Signed)
 This nurse spoke with Thersia   Informed her that [facility] denied him for transport at this time   States she will try to find transportation and call me back

## 2024-07-24 ENCOUNTER — Emergency Department
Admission: EM | Admit: 2024-07-24 | Discharge: 2024-07-24 | Disposition: A | Discharge status: Home / Self Care | Attending: Emergency Medicine | Admitting: Emergency Medicine

## 2024-07-24 ENCOUNTER — Other Ambulatory Visit: Payer: Self-pay

## 2024-07-24 ENCOUNTER — Emergency Department

## 2024-07-24 DIAGNOSIS — I10 Essential (primary) hypertension: Secondary | ICD-10-CM | POA: Diagnosis not present

## 2024-07-24 DIAGNOSIS — R296 Repeated falls: Secondary | ICD-10-CM | POA: Diagnosis not present

## 2024-07-24 DIAGNOSIS — W01198A Fall on same level from slipping, tripping and stumbling with subsequent striking against other object, initial encounter: Secondary | ICD-10-CM | POA: Insufficient documentation

## 2024-07-24 DIAGNOSIS — R791 Abnormal coagulation profile: Secondary | ICD-10-CM | POA: Insufficient documentation

## 2024-07-24 DIAGNOSIS — W19XXXA Unspecified fall, initial encounter: Secondary | ICD-10-CM

## 2024-07-24 DIAGNOSIS — I959 Hypotension, unspecified: Secondary | ICD-10-CM | POA: Insufficient documentation

## 2024-07-24 DIAGNOSIS — S0990XA Unspecified injury of head, initial encounter: Secondary | ICD-10-CM | POA: Diagnosis present

## 2024-07-24 DIAGNOSIS — I6782 Cerebral ischemia: Secondary | ICD-10-CM | POA: Diagnosis not present

## 2024-07-24 DIAGNOSIS — J449 Chronic obstructive pulmonary disease, unspecified: Secondary | ICD-10-CM | POA: Insufficient documentation

## 2024-07-24 DIAGNOSIS — G9389 Other specified disorders of brain: Secondary | ICD-10-CM | POA: Insufficient documentation

## 2024-07-24 DIAGNOSIS — Y92129 Unspecified place in nursing home as the place of occurrence of the external cause: Secondary | ICD-10-CM | POA: Insufficient documentation

## 2024-07-24 DIAGNOSIS — S0001XA Abrasion of scalp, initial encounter: Secondary | ICD-10-CM | POA: Insufficient documentation

## 2024-07-24 LAB — COMPREHENSIVE METABOLIC PANEL WITH GFR
ALT: 8 U/L (ref 0–44)
AST: 13 U/L — ABNORMAL LOW (ref 15–41)
Albumin: 3.4 g/dL — ABNORMAL LOW (ref 3.5–5.0)
Alkaline Phosphatase: 74 U/L (ref 38–126)
Anion gap: 11 (ref 5–15)
BUN: 14 mg/dL (ref 8–23)
CO2: 24 mmol/L (ref 22–32)
Calcium: 8.6 mg/dL — ABNORMAL LOW (ref 8.9–10.3)
Chloride: 109 mmol/L (ref 98–111)
Creatinine, Ser: 0.98 mg/dL (ref 0.61–1.24)
GFR, Estimated: >60 mL/min (ref >60)
Glucose, Bld: 106 mg/dL — ABNORMAL HIGH (ref 70–99)
Potassium: 3.8 mmol/L (ref 3.5–5.1)
Sodium: 144 mmol/L (ref 135–145)
Total Bilirubin: 0.7 mg/dL (ref 0.0–1.2)
Total Protein: 6 g/dL — ABNORMAL LOW (ref 6.5–8.1)

## 2024-07-24 LAB — CBC WITH DIFFERENTIAL/PLATELET
Abs Immature Granulocytes: 0.02 K/uL (ref 0.00–0.07)
Basophils Absolute: 0 K/uL (ref 0.0–0.1)
Basophils Relative: 0 %
Eosinophils Absolute: 0.1 K/uL (ref 0.0–0.5)
Eosinophils Relative: 1 %
HCT: 32.4 % — ABNORMAL LOW (ref 39.0–52.0)
Hemoglobin: 10.7 g/dL — ABNORMAL LOW (ref 13.0–17.0)
Immature Granulocytes: 0 %
Lymphocytes Relative: 37 %
Lymphs Abs: 2.7 K/uL (ref 0.7–4.0)
MCH: 31.8 pg (ref 26.0–34.0)
MCHC: 33 g/dL (ref 30.0–36.0)
MCV: 96.1 fL (ref 80.0–100.0)
Monocytes Absolute: 0.5 K/uL (ref 0.1–1.0)
Monocytes Relative: 7 %
Neutro Abs: 4.1 K/uL (ref 1.7–7.7)
Neutrophils Relative %: 55 %
Platelets: 198 K/uL (ref 150–400)
RBC: 3.37 MIL/uL — ABNORMAL LOW (ref 4.22–5.81)
RDW: 14.9 % (ref 11.5–15.5)
WBC: 7.4 K/uL (ref 4.0–10.5)
nRBC: 0 % (ref 0.0–0.2)

## 2024-07-24 LAB — RESP PANEL BY RT-PCR (RSV, FLU A&B, COVID)  RVPGX2
Influenza A by PCR: NEGATIVE
Influenza B by PCR: NEGATIVE
Resp Syncytial Virus by PCR: NEGATIVE
SARS Coronavirus 2 by RT PCR: NEGATIVE

## 2024-07-24 LAB — PROTIME-INR
INR: 1.2 (ref 0.8–1.2)
Prothrombin Time: 15.6 s — ABNORMAL HIGH (ref 11.4–15.2)

## 2024-07-24 LAB — LACTIC ACID, PLASMA: Lactic Acid, Venous: 1.1 mmol/L (ref 0.5–1.9)

## 2024-07-24 MED ORDER — SODIUM CHLORIDE 0.9 % IV BOLUS
1000.0000 mL | Freq: Once | INTRAVENOUS | Status: AC
  Administered 2024-07-24: 1000 mL via INTRAVENOUS

## 2024-07-24 MED ORDER — MIDODRINE HCL 5 MG PO TABS
10.0000 mg | ORAL_TABLET | Freq: Three times a day (TID) | ORAL | Status: DC
  Administered 2024-07-24: 10 mg via ORAL
  Filled 2024-07-24: qty 2

## 2024-07-24 NOTE — ED Notes (Signed)
 Patient continuously trying to get out of bed and confused.  Redirect back to bed with fall bundle in place.  Now sitter at bedside as Recruitment consultant.

## 2024-07-24 NOTE — ED Notes (Signed)
 Patient got out of bed and pulled IV out.  Patient redirected back to bed. All Fall risk bundle in place.

## 2024-07-24 NOTE — ED Triage Notes (Addendum)
 Pt BIB [facility] for recurrent falls at [facility]. Pts roommate notified staff that the patient has been falling a lot and getting himself back up to wheelchair. Pt had a fall striking the back of his head with an unknown LOC. EMS reports small wound; bleeding controlled. Pt alert to self but confused r/t dementia. Pt does not take blood thinners. EMS report hypotension 75/48 and given 1L NS. Rpt BP 101/59.

## 2024-07-24 NOTE — ED Notes (Signed)
 Transported to CT

## 2024-07-24 NOTE — ED Provider Notes (Signed)
 [facility] Provider Note    Event Date/Time   First MD Initiated Contact with Patient 07/24/24 1155     (approximate)  History   Chief Complaint: Fall  HPI  Ian Medina is a 68 y.o. male with a past medical history of COPD, gastric reflux, hypertension, hyperlipidemia, intellectual disability, schizoaffective, presents to the emergency department for falls.  According to EMS report patient is, from [facility] where he has been falling fairly frequently.  Patient has a history of frequent falls however with the most recent fall patient fell backwards hitting the back of his head noted to have a hematoma/abrasion to this area.  Patient noted to be hypotensive by EMS 75/48 and started on normal saline.  Physical Exam   Triage Vital Signs: ED Triage Vitals  Encounter Vitals Group     BP 07/24/24 1138 90/60     Girls Systolic BP Percentile --      Girls Diastolic BP Percentile --      Boys Systolic BP Percentile --      Boys Diastolic BP Percentile --      Pulse Rate 07/24/24 1138 68     Resp 07/24/24 1138 10     Temp 07/24/24 1138 97.6 F (36.4 C)     Temp Source 07/24/24 1138 Oral     SpO2 07/24/24 1136 98 %     Weight 07/24/24 1148 140 lb (63.5 kg)     Height 07/24/24 1148 6' 2.5 (1.892 m)     Head Circumference --      Peak Flow --      Pain Score 07/24/24 1141 0     Pain Loc --      Pain Education --      Exclude from Growth Chart --     Most recent vital signs: Vitals:   07/24/24 1136 07/24/24 1138  BP:  90/60  Pulse:  68  Resp:  10  Temp:  97.6 F (36.4 C)  SpO2: 98% 100%    General: Awake, no distress.  Patient denies any complaints.  Does have a small abrasion to the occipital scalp. CV:  Good peripheral perfusion.  Regular rate and rhythm  Resp:  Normal effort.  Equal breath sounds bilaterally.  Abd:  No distention.  Soft, nontender.  No rebound or guarding.  ED Results / Procedures / Treatments   EKG  EKG viewed  and interpreted by myself shows a normal sinus rhythm at 66 bpm with a narrow QRS, normal axis, normal intervals, no concerning ST changes.  RADIOLOGY  I have reviewed interpreted CT head images.  There is no bleed seen on my evaluation of the images. Radiology has read the CT scan the head is negative for acute abnormality does show chronic encephalomalacia. CT cervical spine is negative for acute process.   MEDICATIONS ORDERED IN ED: Medications  sodium chloride  0.9 % bolus 1,000 mL (has no administration in time range)  midodrine  (PROAMATINE ) tablet 10 mg (has no administration in time range)     IMPRESSION / MDM / ASSESSMENT AND PLAN / ED COURSE  I reviewed the triage vital signs and the nursing notes.  Patient's presentation is most consistent with acute presentation with potential threat to life or bodily function.  Patient presents to the emergency department after mechanical fall at his nursing facility.  Patient initially noted to be hypotensive did have a bit of a cough.  We will check labs including a lactate.  We will  obtain a chest x-ray and respiratory swab.  Given the fall with head injury will obtain CT imaging of the head and C-spine as a precaution.  We will IV hydrate currently 90/60.  Will continue to closely monitor while awaiting results.  After reviewing the patient's chart further appears to take midodrine  3 times daily presumably has not taken his lunchtime dose.  Will dose 10 mg of oral midodrine  while awaiting results.  Patient CBC is reassuring no significant findings.  Chemistry is resulted as well reassuring with no significant finding, lactic acid is normal at 1.1.  Patient's respiratory panel is negative.  Patient CT scan of the head shows no acute findings and CT C-spine is negative.  Chest x-ray is clear.  Given the patient's reassuring workup increased vital signs after taking his daily midodrine  which she is prescribed will discharge patient home to  follow-up with his doctor.  FINAL CLINICAL IMPRESSION(S) / ED DIAGNOSES   Fall Hypotension  Note:  This document was prepared using Dragon voice recognition software and may include unintentional dictation errors.   Dorothyann Drivers, MD 07/24/24 [id number]

## 2024-07-24 NOTE — ED Notes (Signed)
Unable to obtain urine.

## 2024-07-29 LAB — CULTURE, BLOOD (ROUTINE X 2): Culture: NO GROWTH

## 2024-09-10 ENCOUNTER — Encounter (INDEPENDENT_AMBULATORY_CARE_PROVIDER_SITE_OTHER): Payer: Self-pay | Admitting: Gastroenterology

## 2024-09-22 ENCOUNTER — Emergency Department

## 2024-09-22 ENCOUNTER — Other Ambulatory Visit: Payer: Self-pay

## 2024-09-22 ENCOUNTER — Emergency Department
Admission: EM | Admit: 2024-09-22 | Discharge: 2024-09-22 | Disposition: A | Discharge status: Home / Self Care | Attending: Emergency Medicine | Admitting: Emergency Medicine

## 2024-09-22 DIAGNOSIS — W06XXXA Fall from bed, initial encounter: Secondary | ICD-10-CM | POA: Insufficient documentation

## 2024-09-22 DIAGNOSIS — S0990XA Unspecified injury of head, initial encounter: Secondary | ICD-10-CM | POA: Diagnosis present

## 2024-09-22 DIAGNOSIS — S0083XA Contusion of other part of head, initial encounter: Secondary | ICD-10-CM | POA: Diagnosis not present

## 2024-09-22 DIAGNOSIS — W19XXXA Unspecified fall, initial encounter: Secondary | ICD-10-CM

## 2024-09-22 DIAGNOSIS — S5012XA Contusion of left forearm, initial encounter: Secondary | ICD-10-CM | POA: Diagnosis not present

## 2024-09-22 DIAGNOSIS — S4992XA Unspecified injury of left shoulder and upper arm, initial encounter: Secondary | ICD-10-CM

## 2024-09-22 NOTE — Discharge Instructions (Addendum)
 You have been diagnosed with fall, injury of the head, left arm injury.  Please continue taking your daily pain medication.  Please drink plenty fluids.  Please come back to ED or go to your PCP if you have new symptoms or symptoms worsen.

## 2024-09-22 NOTE — ED Notes (Signed)
 See triage note  Presents via EMS from [facility]   States he had a fall yesterday    States hit his head  No blood thinners  States he has fallen about 5 times this year

## 2024-09-22 NOTE — ED Provider Notes (Signed)
 [facility] Provider Note    Event Date/Time   First MD Initiated Contact with Patient 09/22/24 1612     (approximate)   History   Fall    HPI  Ian Medina is a 68 y.o. male    with a past medical history of fall, closed fracture of shaft of ulna, syncope, acute encephalopathy, sepsis, anorexia, anemia, liver fibrosis, confusion hepatitis C, dysphagia, cerebral aneurysm, chronic paranoid schizophrenia, who presents to the ED complaining of fall. According to the patient He fell this morning from his bed.  Patient is not sure about loss of consciousness, patient denies blurry vision, nauseas, chest pain, abdominal pain, urinary symptoms. Per independent chart review patient felt 5 times this year.  Per independent chart review patient is staying in multiple medications making him dizzy as a side effect.  Patient is here by himself.     Patient Active Problem List   Diagnosis Date Noted   Restlessness and agitation 08/10/2023   Reactive thrombocytosis 07/04/2023   Hypomagnesemia 07/01/2023   Anemia of chronic disease 06/28/2023   Lactic acidosis 06/27/2023   Abnormal ultrasound of liver 03/19/2023   History of hepatitis C 12/17/2022   Liver fibrosis 12/17/2022   Constipation 12/17/2022   Hypotension 10/31/2022   Urinary tract infection due to extended-spectrum beta lactamase (ESBL) producing Escherichia coli 10/29/2022   Aspiration pneumonia (HCC) 10/28/2022   Hypokalemia 10/28/2022   Schizoaffective disorder, bipolar type (HCC) 10/27/2022   Protein-calorie malnutrition, severe 10/26/2022   Loss of weight 10/26/2022   Acute metabolic encephalopathy 10/24/2022   GERD (gastroesophageal reflux disease) 10/24/2022   Urinary retention due to benign prostatic hyperplasia 10/24/2022   Severe sepsis (HCC) 09/25/2022   Sepsis due to pneumonia (HCC) 09/24/2022   Schizoaffective disorder, depressive type (HCC)    Falls    Multiple rib fractures     Aggressive behavior 06/03/2022   Cognitive decline 06/08/2014   Dysphagia 05/11/2014   Encounter for screening colonoscopy 03/12/2013     ROS: Patient currently denies any vision changes, tinnitus, difficulty speaking, facial droop, sore throat, chest pain, shortness of breath, abdominal pain, nausea/vomiting/diarrhea, dysuria, or weakness/numbness/paresthesias in any extremity   Physical Exam   Triage Vital Signs: ED Triage Vitals  Encounter Vitals Group     BP 09/22/24 1529 101/74     Girls Systolic BP Percentile --      Girls Diastolic BP Percentile --      Boys Systolic BP Percentile --      Boys Diastolic BP Percentile --      Pulse Rate 09/22/24 1529 98     Resp 09/22/24 1529 17     Temp 09/22/24 1529 97.8 F (36.6 C)     Temp Source 09/22/24 1529 Oral     SpO2 09/22/24 1529 98 %     Weight --      Height --      Head Circumference --      Peak Flow --      Pain Score 09/22/24 1523 0     Pain Loc --      Pain Education --      Exclude from Growth Chart --     Most recent vital signs: Vitals:   09/22/24 1529  BP: 101/74  Pulse: 98  Resp: 17  Temp: 97.8 F (36.6 C)  SpO2: 98%     Physical Exam Vitals and nursing note reviewed.  During triage vital signs were normal  General:  Awake, no distress, oriented x 4 Head: Right temporal area presence of ecchymosis, no depressions, no lacerations, no active bleeding.  No tenderness to palpation. CV:                  Good peripheral perfusion.  Resp:               Normal effort. no tachypnea Abd:                 No distention.  Soft nontender Other:              Left arm: Presence of ecchymosis in the middle third of of the volar area of left forearm.  No deformities, no tenderness to palpation, no lacerations, no active bleeding. ED Results / Procedures / Treatments   Labs (all labs ordered are listed, but only abnormal results are displayed) Labs Reviewed - No data to display   RADIOLOGY I  independently reviewed and interpreted imaging and agree with radiologists findings.      PROCEDURES:  Critical Care performed:   Procedures   MEDICATIONS ORDERED IN ED: Medications - No data to display Clinical Course as of 09/22/24 1733  Mon Sep 22, 2024  1732 CT Head Wo Contrast 1. No acute intracranial pathology. Small-vessel white matter disease. 2. Coil material in the left middle cranial fossa. 3. No fracture or static subluxation of the cervical spine. 4. Moderate cervical disc degenerative change of C5-C7 with otherwise mild cervical disc degenerative disease.   [AE]    Clinical Course User Index [AE] Janit Kast, [name]    IMPRESSION / MDM / ASSESSMENT AND PLAN / ED COURSE  I reviewed the triage vital signs and the nursing notes.  Differential diagnosis includes, but is not limited to, intracranial hemorrhage, subdural hematoma, fracture, soft tissue injury  Patient's presentation is most consistent with acute complicated illness / injury requiring diagnostic workup.   Ian Medina is a 68 y.o., male who presents today with history of mechanical fall this morning.  Patient is not sure about loss of consciousness.  Patient denies blurry vision, nauseous, chest pain, abdominal pain, urinary symptoms.  On a physical exam presence of ecchymoses in the right temporal area, no deformities, no depressions.  Left forearm presence of ecchymosis in the volar area of the middle third.  Full ROM, pulses positive, sensation is intact.  Rest of physical exam is normal Plan CT of the head CT of the neck Reassess Patient's diagnosis is consistent with fall,, head soft tissue injury, left forearm abrasion,. I independently reviewed and interpreted imaging and agree with radiologists findings ruling out intracranial hemorrhage, fracture.  I did not order any labs. I did review the patient's allergies and medications.The patient is in stable and satisfactory condition for  discharge home  Patient will be discharged home without prescriptions.  I did advise patient to continue taking his daily pain medications . Patient is to follow up with PCP as needed or otherwise directed. Patient is given ED precautions to return to the ED for any worsening or new symptoms. Discussed plan of care with patient, answered all of patient's questions, Patient agreeable to plan of care. Advised patient to take medications according to the instructions on the label. Discussed possible side effects of new medications. Patient verbalized understanding.  FINAL CLINICAL IMPRESSION(S) / ED DIAGNOSES   Final diagnoses:  Fall, initial encounter  Injury of head, initial encounter  Arm injury, left, initial encounter     Rx /  DC Orders   ED Discharge Orders     None        Note:  This document was prepared using Dragon voice recognition software and may include unintentional dictation errors.   Janit Kast, [name] 09/22/24 1733    [name], MD 09/22/24 MAURINE

## 2024-09-22 NOTE — ED Notes (Signed)
 Called [facility] spoke to [place], patient to be transported to [facility]

## 2024-09-22 NOTE — ED Notes (Signed)
 Pt getting out of bed. Pt was assisted back to bed by this tech and Charlena, CHARITY FUNDRAISER. Pt asked for a coke and sandwich. Pt was given a turkey sandwich tray and a cup of coke at this time per pt request. Pt has no further needs at this time. Dor open and pt visualized sitting in bed eating.

## 2024-09-22 NOTE — ED Triage Notes (Signed)
 Pt comes via EMS from [facility] with accidental fall from yesterday. Pt did hit his head and has bandage in place. Pt not on thinners. Pt at baseline.  Pt denies any pain or complaints.
